# Patient Record
Sex: Female | Born: 1940 | Race: White | Hispanic: No | Marital: Married | Smoking: Never smoker
Health system: Southern US, Community
[De-identification: ages and names within clinical notes are randomized; demographics above are authoritative.]

## PROBLEM LIST (undated history)

## (undated) DIAGNOSIS — H269 Unspecified cataract: Secondary | ICD-10-CM

## (undated) DIAGNOSIS — R946 Abnormal results of thyroid function studies: Secondary | ICD-10-CM

## (undated) DIAGNOSIS — I10 Essential (primary) hypertension: Secondary | ICD-10-CM

## (undated) DIAGNOSIS — G4733 Obstructive sleep apnea (adult) (pediatric): Secondary | ICD-10-CM

## (undated) DIAGNOSIS — E669 Obesity, unspecified: Secondary | ICD-10-CM

## (undated) DIAGNOSIS — Z8619 Personal history of other infectious and parasitic diseases: Secondary | ICD-10-CM

## (undated) DIAGNOSIS — G4730 Sleep apnea, unspecified: Secondary | ICD-10-CM

## (undated) DIAGNOSIS — I219 Acute myocardial infarction, unspecified: Secondary | ICD-10-CM

## (undated) DIAGNOSIS — I4891 Unspecified atrial fibrillation: Secondary | ICD-10-CM

## (undated) DIAGNOSIS — N183 Chronic kidney disease, stage 3 unspecified: Secondary | ICD-10-CM

## (undated) DIAGNOSIS — N059 Unspecified nephritic syndrome with unspecified morphologic changes: Secondary | ICD-10-CM

## (undated) DIAGNOSIS — Z9289 Personal history of other medical treatment: Secondary | ICD-10-CM

## (undated) DIAGNOSIS — E039 Hypothyroidism, unspecified: Secondary | ICD-10-CM

## (undated) DIAGNOSIS — E43 Unspecified severe protein-calorie malnutrition: Secondary | ICD-10-CM

## (undated) DIAGNOSIS — F0631 Mood disorder due to known physiological condition with depressive features: Secondary | ICD-10-CM

## (undated) DIAGNOSIS — K52839 Microscopic colitis, unspecified: Secondary | ICD-10-CM

## (undated) DIAGNOSIS — D519 Vitamin B12 deficiency anemia, unspecified: Secondary | ICD-10-CM

## (undated) DIAGNOSIS — R0600 Dyspnea, unspecified: Secondary | ICD-10-CM

## (undated) DIAGNOSIS — R918 Other nonspecific abnormal finding of lung field: Secondary | ICD-10-CM

## (undated) DIAGNOSIS — K648 Other hemorrhoids: Secondary | ICD-10-CM

## (undated) DIAGNOSIS — M1990 Unspecified osteoarthritis, unspecified site: Secondary | ICD-10-CM

## (undated) DIAGNOSIS — Z86010 Personal history of colonic polyps: Secondary | ICD-10-CM

## (undated) DIAGNOSIS — I2510 Atherosclerotic heart disease of native coronary artery without angina pectoris: Secondary | ICD-10-CM

## (undated) DIAGNOSIS — E785 Hyperlipidemia, unspecified: Secondary | ICD-10-CM

## (undated) HISTORY — DX: Other nonspecific abnormal finding of lung field: R91.8

## (undated) HISTORY — DX: Hypothyroidism, unspecified: E03.9

## (undated) HISTORY — DX: Other hemorrhoids: K64.8

## (undated) HISTORY — DX: Chronic kidney disease, stage 3 unspecified: N18.30

## (undated) HISTORY — DX: Unspecified atrial fibrillation: I48.91

## (undated) HISTORY — PX: CARDIAC CATHETERIZATION: SHX172

## (undated) HISTORY — DX: Personal history of other infectious and parasitic diseases: Z86.19

## (undated) HISTORY — DX: Microscopic colitis, unspecified: K52.839

## (undated) HISTORY — DX: Vitamin B12 deficiency anemia, unspecified: D51.9

## (undated) HISTORY — DX: Sleep apnea, unspecified: G47.30

## (undated) HISTORY — PX: BREAST BIOPSY: SHX20

## (undated) HISTORY — DX: Unspecified nephritic syndrome with unspecified morphologic changes: N05.9

## (undated) HISTORY — DX: Personal history of other medical treatment: Z92.89

## (undated) HISTORY — DX: Hyperlipidemia, unspecified: E78.5

## (undated) HISTORY — DX: Mood disorder due to known physiological condition with depressive features: F06.31

## (undated) HISTORY — DX: Abnormal results of thyroid function studies: R94.6

## (undated) HISTORY — DX: Unspecified severe protein-calorie malnutrition: E43

## (undated) HISTORY — DX: Obstructive sleep apnea (adult) (pediatric): G47.33

## (undated) HISTORY — DX: Chronic kidney disease, stage 3 (moderate): N18.3

## (undated) HISTORY — DX: Dyspnea, unspecified: R06.00

## (undated) HISTORY — DX: Personal history of colonic polyps: Z86.010

## (undated) HISTORY — DX: Unspecified cataract: H26.9

## (undated) HISTORY — DX: Atherosclerotic heart disease of native coronary artery without angina pectoris: I25.10

## (undated) HISTORY — DX: Essential (primary) hypertension: I10

---

## 1992-07-07 HISTORY — PX: APPENDECTOMY: SHX54

## 1998-06-20 ENCOUNTER — Ambulatory Visit (HOSPITAL_COMMUNITY): Admission: RE | Admit: 1998-06-20 | Discharge: 1998-06-20 | Payer: Self-pay | Admitting: Gastroenterology

## 1998-07-07 HISTORY — PX: KNEE ARTHROSCOPY: SUR90

## 1998-11-08 ENCOUNTER — Ambulatory Visit (HOSPITAL_BASED_OUTPATIENT_CLINIC_OR_DEPARTMENT_OTHER): Admission: RE | Admit: 1998-11-08 | Discharge: 1998-11-08 | Payer: Self-pay | Admitting: *Deleted

## 2001-03-10 ENCOUNTER — Other Ambulatory Visit: Admission: RE | Admit: 2001-03-10 | Discharge: 2001-03-10 | Payer: Self-pay | Admitting: Obstetrics and Gynecology

## 2001-07-05 ENCOUNTER — Ambulatory Visit (HOSPITAL_COMMUNITY): Admission: RE | Admit: 2001-07-05 | Discharge: 2001-07-05 | Payer: Self-pay | Admitting: Gastroenterology

## 2001-07-05 ENCOUNTER — Encounter (INDEPENDENT_AMBULATORY_CARE_PROVIDER_SITE_OTHER): Payer: Self-pay

## 2002-04-18 ENCOUNTER — Other Ambulatory Visit: Admission: RE | Admit: 2002-04-18 | Discharge: 2002-04-18 | Payer: Self-pay | Admitting: Obstetrics and Gynecology

## 2002-07-07 HISTORY — PX: CHOLECYSTECTOMY: SHX55

## 2002-07-07 HISTORY — PX: OTHER SURGICAL HISTORY: SHX169

## 2002-10-31 ENCOUNTER — Emergency Department (HOSPITAL_COMMUNITY): Admission: EM | Admit: 2002-10-31 | Discharge: 2002-10-31 | Payer: Self-pay | Admitting: Emergency Medicine

## 2002-10-31 ENCOUNTER — Inpatient Hospital Stay (HOSPITAL_COMMUNITY): Admission: EM | Admit: 2002-10-31 | Discharge: 2002-11-04 | Payer: Self-pay | Admitting: *Deleted

## 2002-10-31 ENCOUNTER — Encounter: Payer: Self-pay | Admitting: Emergency Medicine

## 2002-11-03 ENCOUNTER — Encounter: Payer: Self-pay | Admitting: Cardiology

## 2003-05-12 ENCOUNTER — Other Ambulatory Visit: Admission: RE | Admit: 2003-05-12 | Discharge: 2003-05-12 | Payer: Self-pay | Admitting: Obstetrics and Gynecology

## 2003-07-08 HISTORY — PX: VENTRAL HERNIA REPAIR: SHX424

## 2003-11-18 ENCOUNTER — Emergency Department (HOSPITAL_COMMUNITY): Admission: EM | Admit: 2003-11-18 | Discharge: 2003-11-18 | Payer: Self-pay | Admitting: Emergency Medicine

## 2003-11-23 ENCOUNTER — Emergency Department (HOSPITAL_COMMUNITY): Admission: EM | Admit: 2003-11-23 | Discharge: 2003-11-23 | Payer: Self-pay | Admitting: Emergency Medicine

## 2003-11-27 ENCOUNTER — Encounter: Admission: RE | Admit: 2003-11-27 | Discharge: 2004-01-05 | Payer: Self-pay | Admitting: Family Medicine

## 2004-08-15 ENCOUNTER — Other Ambulatory Visit: Admission: RE | Admit: 2004-08-15 | Discharge: 2004-08-15 | Payer: Self-pay | Admitting: Obstetrics and Gynecology

## 2004-09-11 ENCOUNTER — Ambulatory Visit (HOSPITAL_COMMUNITY): Admission: RE | Admit: 2004-09-11 | Discharge: 2004-09-11 | Payer: Self-pay | Admitting: Gastroenterology

## 2004-09-11 ENCOUNTER — Encounter (INDEPENDENT_AMBULATORY_CARE_PROVIDER_SITE_OTHER): Payer: Self-pay | Admitting: *Deleted

## 2004-09-26 ENCOUNTER — Encounter (INDEPENDENT_AMBULATORY_CARE_PROVIDER_SITE_OTHER): Payer: Self-pay | Admitting: *Deleted

## 2004-09-26 ENCOUNTER — Ambulatory Visit (HOSPITAL_COMMUNITY): Admission: RE | Admit: 2004-09-26 | Discharge: 2004-09-26 | Payer: Self-pay | Admitting: Obstetrics and Gynecology

## 2005-02-14 ENCOUNTER — Encounter: Admission: RE | Admit: 2005-02-14 | Discharge: 2005-05-15 | Payer: Self-pay | Admitting: Family Medicine

## 2005-09-04 ENCOUNTER — Other Ambulatory Visit: Admission: RE | Admit: 2005-09-04 | Discharge: 2005-09-04 | Payer: Self-pay | Admitting: Obstetrics and Gynecology

## 2006-07-23 ENCOUNTER — Inpatient Hospital Stay (HOSPITAL_COMMUNITY): Admission: EM | Admit: 2006-07-23 | Discharge: 2006-07-26 | Payer: Self-pay | Admitting: Emergency Medicine

## 2006-07-23 ENCOUNTER — Ambulatory Visit: Payer: Self-pay | Admitting: Cardiology

## 2006-07-24 ENCOUNTER — Encounter (INDEPENDENT_AMBULATORY_CARE_PROVIDER_SITE_OTHER): Payer: Self-pay | Admitting: *Deleted

## 2009-01-15 ENCOUNTER — Ambulatory Visit (HOSPITAL_COMMUNITY): Admission: RE | Admit: 2009-01-15 | Discharge: 2009-01-16 | Payer: Self-pay | Admitting: Surgery

## 2009-12-11 ENCOUNTER — Encounter: Admission: RE | Admit: 2009-12-11 | Discharge: 2010-03-11 | Payer: Self-pay | Admitting: Family Medicine

## 2010-07-07 HISTORY — PX: SHOULDER ARTHROSCOPY: SHX128

## 2010-08-06 ENCOUNTER — Encounter
Admission: RE | Admit: 2010-08-06 | Discharge: 2010-08-06 | Payer: Self-pay | Source: Home / Self Care | Attending: Family Medicine | Admitting: Family Medicine

## 2010-08-09 ENCOUNTER — Ambulatory Visit: Payer: Medicare Other | Attending: Family Medicine | Admitting: *Deleted

## 2010-08-09 DIAGNOSIS — M545 Low back pain, unspecified: Secondary | ICD-10-CM | POA: Insufficient documentation

## 2010-08-09 DIAGNOSIS — M25519 Pain in unspecified shoulder: Secondary | ICD-10-CM | POA: Insufficient documentation

## 2010-08-09 DIAGNOSIS — IMO0001 Reserved for inherently not codable concepts without codable children: Secondary | ICD-10-CM | POA: Insufficient documentation

## 2010-08-09 DIAGNOSIS — R5381 Other malaise: Secondary | ICD-10-CM | POA: Insufficient documentation

## 2010-08-09 DIAGNOSIS — M542 Cervicalgia: Secondary | ICD-10-CM | POA: Insufficient documentation

## 2010-08-13 ENCOUNTER — Ambulatory Visit: Payer: Medicare Other | Admitting: Physical Therapy

## 2010-08-15 ENCOUNTER — Ambulatory Visit: Payer: Medicare Other | Admitting: Physical Therapy

## 2010-08-20 ENCOUNTER — Ambulatory Visit: Payer: Medicare Other | Admitting: *Deleted

## 2010-08-22 ENCOUNTER — Ambulatory Visit: Payer: Medicare Other | Admitting: *Deleted

## 2010-08-26 ENCOUNTER — Ambulatory Visit: Payer: Medicare Other | Admitting: Physical Therapy

## 2010-08-29 ENCOUNTER — Ambulatory Visit: Payer: Medicare Other | Admitting: Physical Therapy

## 2010-09-02 ENCOUNTER — Ambulatory Visit: Payer: Medicare Other | Admitting: Physical Therapy

## 2010-09-05 ENCOUNTER — Ambulatory Visit: Payer: Medicare Other | Attending: Family Medicine | Admitting: Physical Therapy

## 2010-09-05 DIAGNOSIS — M545 Low back pain, unspecified: Secondary | ICD-10-CM | POA: Insufficient documentation

## 2010-09-05 DIAGNOSIS — R5381 Other malaise: Secondary | ICD-10-CM | POA: Insufficient documentation

## 2010-09-05 DIAGNOSIS — IMO0001 Reserved for inherently not codable concepts without codable children: Secondary | ICD-10-CM | POA: Insufficient documentation

## 2010-09-05 DIAGNOSIS — M542 Cervicalgia: Secondary | ICD-10-CM | POA: Insufficient documentation

## 2010-09-05 DIAGNOSIS — M25519 Pain in unspecified shoulder: Secondary | ICD-10-CM | POA: Insufficient documentation

## 2010-09-09 ENCOUNTER — Ambulatory Visit: Payer: Medicare Other | Admitting: Physical Therapy

## 2010-09-11 ENCOUNTER — Ambulatory Visit: Payer: Medicare Other | Admitting: Physical Therapy

## 2010-09-24 ENCOUNTER — Ambulatory Visit: Payer: Medicare Other | Admitting: Physical Therapy

## 2010-09-25 ENCOUNTER — Ambulatory Visit: Payer: Medicare Other | Admitting: Physical Therapy

## 2010-09-30 ENCOUNTER — Ambulatory Visit: Payer: Medicare Other | Admitting: Physical Therapy

## 2010-10-03 ENCOUNTER — Ambulatory Visit: Payer: Medicare Other | Admitting: Physical Therapy

## 2010-10-07 ENCOUNTER — Ambulatory Visit: Payer: Medicare Other | Attending: Family Medicine | Admitting: Physical Therapy

## 2010-10-07 DIAGNOSIS — M542 Cervicalgia: Secondary | ICD-10-CM | POA: Insufficient documentation

## 2010-10-07 DIAGNOSIS — R5381 Other malaise: Secondary | ICD-10-CM | POA: Insufficient documentation

## 2010-10-07 DIAGNOSIS — M545 Low back pain, unspecified: Secondary | ICD-10-CM | POA: Insufficient documentation

## 2010-10-07 DIAGNOSIS — IMO0001 Reserved for inherently not codable concepts without codable children: Secondary | ICD-10-CM | POA: Insufficient documentation

## 2010-10-07 DIAGNOSIS — M25519 Pain in unspecified shoulder: Secondary | ICD-10-CM | POA: Insufficient documentation

## 2010-10-10 ENCOUNTER — Ambulatory Visit: Payer: Medicare Other | Admitting: Physical Therapy

## 2010-10-13 LAB — HEMOGLOBIN AND HEMATOCRIT, BLOOD: Hemoglobin: 13.4 g/dL (ref 12.0–15.0)

## 2010-10-13 LAB — BASIC METABOLIC PANEL
Calcium: 9.4 mg/dL (ref 8.4–10.5)
Creatinine, Ser: 0.71 mg/dL (ref 0.4–1.2)
GFR calc Af Amer: >60 mL/min (ref >60)
Potassium: 4 mEq/L (ref 3.5–5.1)

## 2010-10-14 ENCOUNTER — Ambulatory Visit: Payer: Medicare Other | Admitting: Physical Therapy

## 2010-10-16 ENCOUNTER — Ambulatory Visit: Payer: Medicare Other | Admitting: Physical Therapy

## 2010-11-19 NOTE — Op Note (Signed)
NAMECHARLESE, Jody Ruiz             ACCOUNT NO.:  [id number]   MEDICAL RECORD NO.:  [id number]          PATIENT TYPE:  OIB   LOCATION:  1611                         FACILITY:  [facility]   PHYSICIAN:  Thornton Park. Daphine Deutscher, MD  DATE OF BIRTH:  09-Dec-1940   DATE OF PROCEDURE:  01/15/2009  DATE OF DISCHARGE:                               OPERATIVE REPORT   PREOPERATIVE DIAGNOSIS:  Umbilical incisional hernia.   POSTOPERATIVE DIAGNOSIS:  Umbilical incisional hernia.   PROCEDURE:  Laparoscopic repair of umbilical incisional hernia with 12-  cm circular Parietex mesh.   SURGEON:  Thornton Park. Daphine Deutscher, M.D.   ASSISTANT:  None.   ANESTHESIA:  General.   DESCRIPTION OF PROCEDURE:  [name] was taken to room 11 on Monday,  January 15, 2009, and given general anesthesia.  The abdomen was prepped  with a Techni-Care equivalent and draped sterilely.  I entered the  abdomen through the left upper quadrant with a 0-degrees OptiView 5-mm  without difficulty and placed another two 5's on the right side and  ultimately a 10/11 on the left lower quadrant in a very oblique angle.  The umbilical defect was easily visualized and was measured both  externally and internally using needles and I got beyond the rim of  this, then went back about 2 to 3 cm, and marked her and ended up with a  defect about I felt would be adequately covered with a 12-cm piece of  mesh.  We got a 12-cm piece of Parietex and I placed four sutures of #1  Novofil around the perimeter on the non-coated surface and then rolled  it after wetting it, and inserted it through the 10/11 in the left lower  quadrant.  It was unrolled and then in the previously marked spots, I  make cuts transversely and using the Surgi-Pass, I went ahead and  retrieved these and tied them.  That pulled the mesh up to the anterior  abdominal wall.  I then tacked it around the perimeter and then placed  four sutures between the initial four, but more inside and  these four  were then tied down, bringing the mesh up to the anterior abdominal wall  and further securing it with the four sutures and then more tacks.  The  end result was a good complete closure of the defect.  I did not try to  reduce the defect other than putting the mesh beneath it.  Everything  else appeared to be in order, as I did survey the abdomen prior to  exiting.  The abdomen was deflated, the trocars were pulled out.  Wounds  were infiltrated with 20 mL of Marcaine.  The patient was taken to  recovery room in satisfactory condition.      Thornton Park Daphine Deutscher, MD  Electronically Signed     MBM/MEDQ  D:  01/15/2009  T:  01/15/2009  Job:  [id number]

## 2010-11-22 NOTE — Consult Note (Signed)
NAMEDEQUITA, SCHLEICHER             ACCOUNT NO.:  [id number]   MEDICAL RECORD NO.:  [id number]          PATIENT TYPE:  INP   LOCATION:  3714                         FACILITY:  [facility]   PHYSICIAN:  Velora Heckler, MD      DATE OF BIRTH:  1941-03-05   DATE OF CONSULTATION:  DATE OF DISCHARGE:                                 CONSULTATION   REFERRING PHYSICIAN:  Dr. Gala Romney, cardiology service.   CHIEF COMPLAINT:  Abdominal pain, acute cholecystitis.   HISTORY OF PRESENT ILLNESS:  The patient is a 70 year old white female  who became acutely on the day prior to admission.  She presented to the  emergency department at [facility].  She has a history of  coronary artery disease and was felt to have atypical chest pain.  She  was seen and evaluated by cardiology and admitted onto the cardiology  service.  However, workup to rule out myocardial infarction was  completely negative.  White count was elevated 12.3.  Physical exam  showed abdominal tenderness.  Abdominal ultrasound was performed which  demonstrated a thick-walled gallbladder containing multiple gallstones  with pericholecystic fluid consistent with acute cholecystitis.  The  patient was started on intravenous antibiotics and general surgery was  consulted.   PAST MEDICAL HISTORY:  1. Status post appendectomy by Dr. Jerelene Redden.  2. Status post breast biopsies for benign disease.  3. History of colonic polypectomy.  4. History of endometrial polyp.  5. History of morbid obesity.  6. History of dyslipidemia.  7. History of degenerative joint disease.  8. History of coronary artery disease.   MEDICATIONS:  Toprol, Atarax.   ALLERGIES:  1. STATIN drugs.  2. SULFA.   SOCIAL HISTORY:  [place]  She is  married.  She has a daughter from [place].  She denies tobacco,  alcohol and drug use.   FAMILY HISTORY:  Notable for coronary artery disease in multiple family   members.   REVIEW OF SYSTEMS:  A 15-system review documented in the medical record  and discussed with the patient.  The patient notes loose stools  yesterday.  She notes nausea and emesis x3.   EXAM:  GENERAL:  A 70 year old white female on ward 3700 at [facility].  Temperature 97.8, pulse 94, respirations 20, blood pressure  161/84, O2 saturation 94% room air.  HEENT:  Shows her to be normocephalic, atraumatic.  Sclerae clear.  Conjunctiva clear.  Dentition fair.  Mucous membranes moist.  Voice  normal.  LUNGS:  Are clear to auscultation bilaterally without rales, rhonchi or  wheeze.  CARDIAC:  Exam shows regular rate and rhythm without murmur.  Peripheral  pulses are full.  ABDOMEN:  Soft, obese with bowel sounds present on auscultation.  There  is a well-healed right lower quadrant incision.  There are multiple  striae across the lower abdominal wall.  There is tenderness in the  epigastrium, right upper quadrant with voluntary guarding.  There is no  palpable mass.  There is a mild Murphy sign with deep inspiration.  There is no  sign of hernia.  EXTREMITIES:  Nontender without edema.  NEUROLOGICALLY:  The patient is alert and oriented without obvious focal  neurologic deficit.   LABORATORY STUDIES:  White count 12.3, hemoglobin 12.4, hematocrit  36.2%, platelet count 273,000.  Creatinine normal 0.8, amylase 23,  lipase 24.  Cardiac enzymes negative.   RADIOGRAPHY:  Ultrasound results as noted above.   IMPRESSION:  1. Acute cholecystitis.  2. Cholelithiasis.   PLAN:  1. Transfer general surgery service at this time.  2. Intravenous Unasyn 3 grams IV piggyback q.6h.  3. Pain medication with Dilaudid as needed.  4. Zofran for nausea as needed.  5. Discussed indications for cholecystectomy during this      hospitalization.  Discussed open cholecystectomy versus      laparoscopic cholecystectomy.  The patient will be made n.p.o.      after midnight and placed in  the preoperative mode, hopefully as an      add-on to the surgical schedule on January18,2008 at [facility].  Dr. Consuello Bossier will be the attending physician      for the general surgery service on that date and will likely      perform her procedure at that time.      Velora Heckler, MD  Electronically Signed     TMG/MEDQ  D:  07/23/2006  T:  07/23/2006  Job:  [id number]   cc:   Bevelyn Buckles. Bensimhon, MD  Reginia Forts, MD  Geoffry Paradise, M.D.

## 2010-11-22 NOTE — Cardiovascular Report (Signed)
Jody Ruiz, Jody Ruiz                       ACCOUNT NO.:  [id number]   MEDICAL RECORD NO.:  [id number]                   PATIENT TYPE:  INP   LOCATION:  2011                                 FACILITY:  [facility]   PHYSICIAN:  Salvadore Farber, M.D.             DATE OF BIRTH:  11-Apr-1941   DATE OF PROCEDURE:  11/04/2002  DATE OF DISCHARGE:  11/04/2002                              CARDIAC CATHETERIZATION   PROCEDURE:  Intravascular ultrasound and pressure wire of the left anterior  descending artery.   CARDIOLOGIST:  Salvadore Farber, M.D.   INDICATIONS:  The patient is a 70 year old lady admitted with multiple  episodes of chest discomfort.  Cardiac catheterization was performed on  November 01, 2002, by Dr. Riley Kill.  This demonstrated a 50-60% stenosis of the  mid LAD.  It was felt to be likely angiographically benign.  She then  underwent an Adenosine Cardiolite study which demonstrated anterior ischemia  again raising concern about the hemodynamic significance of this lesion.  Therefore, she is scheduled for intravascular ultrasound and pressure wire  measurement to rule out plaque rupture and physiologic significance of this  lesion.   DESCRIPTION OF PROCEDURE:  Informed consent was obtained.  Under 1%  lidocaine local anesthesia, a 6 French sheath was placed in the right  femoral artery using the modified Seldinger technique.  Anticoagulation was  imitated with heparin to achieve an ACT of greater than 250 seconds.  A CLS  3.5 guide was advanced over a wire and engaged in the ostium of the left  main coronary.  IVUS was pursued first.  A BMW wire was advanced to the  distal LAD.  The IVUS catheter was positioned in the mid LAD.  Automatic  pullback was performed after the administration of intracoronary  nitroglycerin.  As detailed below, IVUS findings were benign.   Attention was then turned to the pressure wire measurement.  ACT was again  confirmed to be greater than 250  seconds.  The pressure wire was normalized  in the left main coronary and then positioned in the mid LAD, distal to the  lesion.  Then 42 mcg of intracoronary Adenosine was administered for two  successive measurements.  These demonstrated the FFR to be 0.94 and 0.93.  The pressure wire was then removed.  Final angiograms were performed in  multiple projections demonstrating no change in the coronary anatomy and  angiographic stability of the lesion.   COMPLICATIONS:  None.   FINDINGS:  1. Intravascular ultrasound:  There is no evidence of plaque rupture,     dissection, or overlying thrombus.  There is two quadrant superficial     calcification.  Cross-sectional area of the distal reference vessel is     8.7 mm sq.  Cross-sectional area at the lesion is 8 mm sq for a 45% area     of stenosis.  2. FFR:  0.93 and 0.94 on two successive  measurements.  This is     substantially above the threshold of 0.75, below which signifies a     physiologically significant lesion.   IMPRESSION/RECOMMENDATIONS:  The lesion appears stable by intravascular  ultrasound with insignificant stenosis by both intravascular ultrasound and  FFR criteria.  The hazy appearance on angiogram is well explained by the  superficial calcification.   Chest pain is noncardiac in etiology.  The exercise test was falsely  positive.  Will proceed with continued medical therapy.                                               Salvadore Farber, M.D.    WED/MEDQ  D:  11/04/2002  T:  11/05/2002  Job:  [id number]   cc:   Geoffry Paradise, M.D.  [address]  [place]  [place] [address]  Fax: [phone]   Arturo Morton. Riley Kill, M.D.

## 2010-11-22 NOTE — Discharge Summary (Signed)
[name], [name]             ACCOUNT NO.:  [id number]   MEDICAL RECORD NO.:  [id number]          PATIENT TYPE:  INP   LOCATION:  3714                         FACILITY:  [facility]   PHYSICIAN:  Anselm Pancoast. Zachery Dakins, M.D.DATE OF BIRTH:  15-May-1941   DATE OF ADMISSION:  07/23/2006  DATE OF DISCHARGE:  07/26/2006                               DISCHARGE SUMMARY   DISCHARGE DIAGNOSIS:  Acute cholecystitis with stones.   OPERATION:  Laparoscopic cholecystectomy with cholangiogram.   HISTORY:  Jody Ruiz is a 69 year old, mildly overweight female  admitted through the ER by Dr. Jacky Kindle on July 23, 2006 with severe  chest pain.  The patient was seen.  She is on Plavix and Toprol and was  evaluated by her primary care physician to rule out coronary artery  syndrome.  They held off on anticoagulation and felt that the pain was  not cardiac in origin.  An ultrasound was performed that showed a very  edematous, thickened gallbladder and an enormous stone.  Dr. Gerrit Friends was  on call and he thought that patient had acute cholecystitis, and started  her on broad antibiotics and thought she would need a cholecystectomy  the following day.  Her white count was elevated at 12,000, and I was  asked to see her the following morning.  She was definitely tender and  she was added to the OR schedule.  She was taken to surgery,  __________  and an acute inflamed gallbladder, which was removed laparoscopically  and the pathology report showed acute and chronic cholecystitis with  stones.  Postoperatively, I did place a Blake drain and she felt better,  had a minimal amount of drainage, and was ready for discharge in  approximately 24 hours.  She was discharged on her usual medications  plus Vicodin for pain and instructed to see me in the office for drain  removal in one to two additional days.  Her incision appeared to be  healing satisfactory.  We have discharged her on Augmentin 875 mg b.i.d.  for  approximately three days in addition to the Vicodin.           ______________________________  Anselm Pancoast. Zachery Dakins, M.D.     WJW/MEDQ  D:  10/01/2006  T:  10/02/2006  Job:  [id number]   cc:   Geoffry Paradise, M.D.  Fax: [phone]

## 2010-11-22 NOTE — H&P (Signed)
Jody Ruiz, Jody Ruiz             ACCOUNT NO.:  [id number]   MEDICAL RECORD NO.:  [id number]          PATIENT TYPE:  EMS   LOCATION:  MAJO                         FACILITY:  [facility]   PHYSICIAN:  Reginia Forts, MD     DATE OF BIRTH:  Jan 16, 1941   DATE OF ADMISSION:  07/23/2006  DATE OF DISCHARGE:                              HISTORY & PHYSICAL   CHIEF COMPLAINT:  Chest pain with an abdominal pain.   HISTORY OF PRESENT ILLNESS:  Mrs Mccollister is a 70 year old Caucasian woman  with a history of nonobstructive coronary artery disease who presents  with mid sternal abdominal and chest pain.  Historically, the patient  has had midsternal substernal chest pressure in 2004 and underwent  cardiac catheterization demonstrating 50% LAD lesion.  ICUS demonstrated  luminal decrease of 45%, thus deeming it to be a nonobstructive lesion.  The patient was felt to have noncardiac chest pain and was subsequently  underwent follow up with just her primary care physician.  Since then,  she has been doing well up until tonight when she consumed a glass of  milk and a donut to take her calcium pill.  She immediately laid down  and then developed substernal chest pressure at 9/10.  She experienced  loose bowel movements and subsequent nonbloody diarrhea with progression  of her substernal pain down to her abdomen.  She continued to have  symptoms despite taking multiple nitroglycerins.  Her family was  concerned about her cardiac status and subsequently brought her to the  emergency room.  In the ED, she received morphine, Phenergan, Protonix  with minimal relief but increase in her lethargy.  She continues to have  7/10 mid abdominal pain now radiating to her lower quadrant with  significant gas.  Family is concerned that these may be a manifestation  of her ongoing cardiac status.   PAST MEDICAL HISTORY:  1. History of nonobstructive coronary disease.  Catheter on November 01, 2002, demonstrated a  40-50% mid LAD lesion with ICUS, 30% mid to      distal LAD lesion, small ramus that was patent, 10-20% proximal      circumflex lesion and a 10-20% proximal RCA lesion, 30% tandem      lesions in the PL branch.  2. Two breast biopsies looked benign.  3. Endometrial polyp.  4. Morbid obesity.  5. Dyslipidemia.  The patient is intolerant to statins.  6. Degenerative joint disease and iron deficiency anemia.   ALLERGIES:  STATIN, SULFA.   MEDICATIONS:  1. Toprol XL 100 mg daily.  2. Plavix 75 mg daily.   SOCIAL HISTORY:  The patient lives in [place] with her husband.  Daughter lives nearby.  Denies any history of tobacco, alcohol or drug  use.   FAMILY HISTORY:  Notable for a mother with cardiac disease at age 68 and  father had an MI at age 69.  Brother and sister both had MI.   REVIEW OF SYSTEMS:  Reviewed and is negative, all 12 review of systems  is negative.   PHYSICAL EXAMINATION:  VITAL SIGNS:  Temperature 97, pulse 67,  respiratory rate 16, blood pressure 134/61.  GENERAL:  The patient is lethargic but arousable.  HEENT:  Normocephalic, atraumatic.  Pupils equal, round and reactive to  light.  Extraocular movements intact.  NECK:  No JVD.  No carotid bruits.  CARDIOVASCULAR:  Regular rhythm, no murmurs, rubs or gallops.  LUNGS:  Clear to auscultation bilaterally.  ABDOMEN:  Positive bowel sounds, obese with mild diffuse tenderness in  the mid gastric region radiating to the lower quadrant.  No increased  tympany.  SKIN:  No rash.  EXTREMITIES:  No significant tenderness or effusion.  NEUROLOGICAL:  Cranial nerves II-XII intact.  Negative for motor,  sensory deficits.   STUDIES:  Chest x-ray demonstrates a mild bibasilar atelectasis.  EKG  demonstrates normal sinus rhythm at rate of 90 and no ST-T wave changes.   LABORATORY DATA:  Demonstrated white count of 12.3, hemoglobin 12.9,  platelets 233,000.  BUN 19, creatinine 0.8, troponin less than 0.05.  CK  43, D.  dimer of 0.22.   ASSESSMENT/PLAN:  A 70 year old woman with atypical chest pain, symptoms  more consistent with a GI etiology such as a gastroenteritis and GERD as  opposed to acute coronary syndrome.  The patient is concerned about  cardiac cause.  1. Acute coronary syndrome:  Rule the patient out for MI.  We will      hold off on anticoagulation. Stress test may be considered,      although, her exercise stress test in 2004 was false positive.  2. Gastrointestinal:  The patient will receive Protonix and a GI      cocktail and supportive care.      Reginia Forts, MD  Electronically Signed     RA/MEDQ  D:  07/23/2006  T:  07/23/2006  Job:  [id number]   cc:   Doylene Canning. Ladona Ridgel, MD

## 2011-05-26 ENCOUNTER — Ambulatory Visit: Payer: Medicare Other | Attending: Orthopedic Surgery | Admitting: Physical Therapy

## 2011-05-26 DIAGNOSIS — R5381 Other malaise: Secondary | ICD-10-CM | POA: Insufficient documentation

## 2011-05-26 DIAGNOSIS — M25619 Stiffness of unspecified shoulder, not elsewhere classified: Secondary | ICD-10-CM | POA: Insufficient documentation

## 2011-05-26 DIAGNOSIS — IMO0001 Reserved for inherently not codable concepts without codable children: Secondary | ICD-10-CM | POA: Insufficient documentation

## 2011-05-26 DIAGNOSIS — M25519 Pain in unspecified shoulder: Secondary | ICD-10-CM | POA: Insufficient documentation

## 2011-05-27 ENCOUNTER — Ambulatory Visit: Payer: Medicare Other | Admitting: *Deleted

## 2011-06-02 ENCOUNTER — Ambulatory Visit: Payer: Medicare Other | Admitting: Physical Therapy

## 2011-06-04 ENCOUNTER — Ambulatory Visit: Payer: Medicare Other | Admitting: Physical Therapy

## 2011-06-06 ENCOUNTER — Ambulatory Visit: Payer: Medicare Other | Admitting: *Deleted

## 2011-06-09 ENCOUNTER — Ambulatory Visit: Payer: Medicare Other | Attending: Orthopedic Surgery | Admitting: Physical Therapy

## 2011-06-09 DIAGNOSIS — R5381 Other malaise: Secondary | ICD-10-CM | POA: Insufficient documentation

## 2011-06-09 DIAGNOSIS — M25519 Pain in unspecified shoulder: Secondary | ICD-10-CM | POA: Insufficient documentation

## 2011-06-09 DIAGNOSIS — IMO0001 Reserved for inherently not codable concepts without codable children: Secondary | ICD-10-CM | POA: Insufficient documentation

## 2011-06-09 DIAGNOSIS — M25619 Stiffness of unspecified shoulder, not elsewhere classified: Secondary | ICD-10-CM | POA: Insufficient documentation

## 2011-06-11 ENCOUNTER — Ambulatory Visit: Payer: Medicare Other | Admitting: Physical Therapy

## 2011-06-13 ENCOUNTER — Ambulatory Visit: Payer: Medicare Other | Admitting: *Deleted

## 2011-06-16 ENCOUNTER — Ambulatory Visit: Payer: Medicare Other | Admitting: Physical Therapy

## 2011-06-18 ENCOUNTER — Ambulatory Visit: Payer: Medicare Other | Admitting: Physical Therapy

## 2011-06-20 ENCOUNTER — Ambulatory Visit: Payer: Medicare Other | Admitting: *Deleted

## 2011-06-23 ENCOUNTER — Ambulatory Visit: Payer: Medicare Other | Admitting: Physical Therapy

## 2011-06-25 ENCOUNTER — Ambulatory Visit: Payer: Medicare Other | Admitting: Physical Therapy

## 2011-06-27 ENCOUNTER — Ambulatory Visit: Payer: Medicare Other | Admitting: *Deleted

## 2011-07-02 ENCOUNTER — Ambulatory Visit: Payer: Medicare Other | Admitting: Physical Therapy

## 2011-07-04 ENCOUNTER — Ambulatory Visit: Payer: Medicare Other | Admitting: Physical Therapy

## 2011-07-08 DIAGNOSIS — I219 Acute myocardial infarction, unspecified: Secondary | ICD-10-CM

## 2011-07-08 HISTORY — DX: Acute myocardial infarction, unspecified: I21.9

## 2011-07-09 ENCOUNTER — Ambulatory Visit: Payer: Medicare Other | Attending: Orthopedic Surgery | Admitting: Physical Therapy

## 2011-07-09 DIAGNOSIS — R5381 Other malaise: Secondary | ICD-10-CM | POA: Insufficient documentation

## 2011-07-09 DIAGNOSIS — M25519 Pain in unspecified shoulder: Secondary | ICD-10-CM | POA: Insufficient documentation

## 2011-07-09 DIAGNOSIS — IMO0001 Reserved for inherently not codable concepts without codable children: Secondary | ICD-10-CM | POA: Diagnosis not present

## 2011-07-09 DIAGNOSIS — M25619 Stiffness of unspecified shoulder, not elsewhere classified: Secondary | ICD-10-CM | POA: Diagnosis not present

## 2011-07-11 ENCOUNTER — Ambulatory Visit: Payer: Medicare Other | Admitting: Physical Therapy

## 2011-07-14 ENCOUNTER — Ambulatory Visit: Payer: Medicare Other | Admitting: Physical Therapy

## 2011-07-16 ENCOUNTER — Ambulatory Visit: Payer: Medicare Other | Admitting: Physical Therapy

## 2011-07-16 DIAGNOSIS — R82998 Other abnormal findings in urine: Secondary | ICD-10-CM | POA: Diagnosis not present

## 2011-07-16 DIAGNOSIS — E785 Hyperlipidemia, unspecified: Secondary | ICD-10-CM | POA: Diagnosis not present

## 2011-07-16 DIAGNOSIS — R7301 Impaired fasting glucose: Secondary | ICD-10-CM | POA: Diagnosis not present

## 2011-07-16 DIAGNOSIS — E039 Hypothyroidism, unspecified: Secondary | ICD-10-CM | POA: Diagnosis not present

## 2011-07-16 DIAGNOSIS — I10 Essential (primary) hypertension: Secondary | ICD-10-CM | POA: Diagnosis not present

## 2011-07-18 ENCOUNTER — Ambulatory Visit: Payer: Medicare Other | Admitting: *Deleted

## 2011-07-21 ENCOUNTER — Ambulatory Visit: Payer: Medicare Other | Admitting: Physical Therapy

## 2011-07-23 ENCOUNTER — Ambulatory Visit: Payer: Medicare Other | Admitting: Physical Therapy

## 2011-07-23 DIAGNOSIS — I10 Essential (primary) hypertension: Secondary | ICD-10-CM | POA: Diagnosis not present

## 2011-07-23 DIAGNOSIS — Z0000 Encounter for general adult medical examination without abnormal findings: Secondary | ICD-10-CM | POA: Diagnosis not present

## 2011-07-23 DIAGNOSIS — I2510 Atherosclerotic heart disease of native coronary artery without angina pectoris: Secondary | ICD-10-CM | POA: Diagnosis not present

## 2011-07-23 DIAGNOSIS — E785 Hyperlipidemia, unspecified: Secondary | ICD-10-CM | POA: Diagnosis not present

## 2011-07-24 DIAGNOSIS — Z1212 Encounter for screening for malignant neoplasm of rectum: Secondary | ICD-10-CM | POA: Diagnosis not present

## 2011-07-25 ENCOUNTER — Encounter: Payer: PRIVATE HEALTH INSURANCE | Admitting: *Deleted

## 2011-07-28 ENCOUNTER — Ambulatory Visit: Payer: Medicare Other | Admitting: Physical Therapy

## 2011-07-30 ENCOUNTER — Ambulatory Visit: Payer: Medicare Other | Admitting: Physical Therapy

## 2011-08-01 ENCOUNTER — Ambulatory Visit: Payer: Medicare Other | Admitting: Physical Therapy

## 2011-08-04 ENCOUNTER — Ambulatory Visit: Payer: Medicare Other | Admitting: Physical Therapy

## 2011-08-06 ENCOUNTER — Ambulatory Visit: Payer: Medicare Other | Admitting: Physical Therapy

## 2011-08-08 ENCOUNTER — Ambulatory Visit: Payer: Medicare Other | Attending: Orthopedic Surgery | Admitting: *Deleted

## 2011-08-08 DIAGNOSIS — M25519 Pain in unspecified shoulder: Secondary | ICD-10-CM | POA: Insufficient documentation

## 2011-08-08 DIAGNOSIS — M25619 Stiffness of unspecified shoulder, not elsewhere classified: Secondary | ICD-10-CM | POA: Insufficient documentation

## 2011-08-08 DIAGNOSIS — R5381 Other malaise: Secondary | ICD-10-CM | POA: Insufficient documentation

## 2011-08-08 DIAGNOSIS — IMO0001 Reserved for inherently not codable concepts without codable children: Secondary | ICD-10-CM | POA: Insufficient documentation

## 2011-08-11 ENCOUNTER — Ambulatory Visit: Payer: Medicare Other | Admitting: Physical Therapy

## 2011-08-11 DIAGNOSIS — M25519 Pain in unspecified shoulder: Secondary | ICD-10-CM | POA: Diagnosis not present

## 2011-08-11 DIAGNOSIS — M25619 Stiffness of unspecified shoulder, not elsewhere classified: Secondary | ICD-10-CM | POA: Diagnosis not present

## 2011-08-11 DIAGNOSIS — R5381 Other malaise: Secondary | ICD-10-CM | POA: Diagnosis not present

## 2011-08-11 DIAGNOSIS — IMO0001 Reserved for inherently not codable concepts without codable children: Secondary | ICD-10-CM | POA: Diagnosis not present

## 2011-08-13 ENCOUNTER — Encounter: Payer: PRIVATE HEALTH INSURANCE | Admitting: Physical Therapy

## 2011-08-13 DIAGNOSIS — H35039 Hypertensive retinopathy, unspecified eye: Secondary | ICD-10-CM | POA: Diagnosis not present

## 2011-08-13 DIAGNOSIS — H2510 Age-related nuclear cataract, unspecified eye: Secondary | ICD-10-CM | POA: Diagnosis not present

## 2011-09-11 ENCOUNTER — Encounter: Payer: Self-pay | Admitting: Gastroenterology

## 2011-09-11 DIAGNOSIS — R05 Cough: Secondary | ICD-10-CM | POA: Diagnosis not present

## 2011-09-11 DIAGNOSIS — I10 Essential (primary) hypertension: Secondary | ICD-10-CM | POA: Diagnosis not present

## 2011-09-11 DIAGNOSIS — J321 Chronic frontal sinusitis: Secondary | ICD-10-CM | POA: Diagnosis not present

## 2011-09-15 DIAGNOSIS — R05 Cough: Secondary | ICD-10-CM | POA: Diagnosis not present

## 2011-09-15 DIAGNOSIS — J321 Chronic frontal sinusitis: Secondary | ICD-10-CM | POA: Diagnosis not present

## 2011-09-26 ENCOUNTER — Telehealth: Payer: Self-pay

## 2011-09-26 NOTE — Telephone Encounter (Signed)
I spoke with the pt and she says she does have a recent history with Dr Madilyn Fireman from 11/12.  Appt for 3/26 has been cx.  She would like to switch to Dr Christella Hartigan.  I advised that I would need to have her GI records and Dr Christella Hartigan would review those and if he decides to accept the pt I will call her back and reschedule the appt.  She is bringing what she has here today.

## 2011-09-26 NOTE — Telephone Encounter (Signed)
I spoke with Jody Ruiz at her referring's office and she is going to find out why the pt is coming to our office when she has a recent history 09/23/09 with Dr Madilyn Fireman.  I also called the pt to ask if she was switching care and why.

## 2011-09-30 ENCOUNTER — Ambulatory Visit: Payer: PRIVATE HEALTH INSURANCE | Admitting: Gastroenterology

## 2011-10-06 DIAGNOSIS — J189 Pneumonia, unspecified organism: Secondary | ICD-10-CM | POA: Diagnosis not present

## 2011-10-06 DIAGNOSIS — K589 Irritable bowel syndrome without diarrhea: Secondary | ICD-10-CM | POA: Diagnosis not present

## 2011-10-06 DIAGNOSIS — R05 Cough: Secondary | ICD-10-CM | POA: Diagnosis not present

## 2011-10-06 DIAGNOSIS — I10 Essential (primary) hypertension: Secondary | ICD-10-CM | POA: Diagnosis not present

## 2011-10-13 ENCOUNTER — Telehealth: Payer: Self-pay

## 2011-10-13 DIAGNOSIS — R05 Cough: Secondary | ICD-10-CM | POA: Diagnosis not present

## 2011-10-13 DIAGNOSIS — I10 Essential (primary) hypertension: Secondary | ICD-10-CM | POA: Diagnosis not present

## 2011-10-13 DIAGNOSIS — R197 Diarrhea, unspecified: Secondary | ICD-10-CM | POA: Diagnosis not present

## 2011-10-13 DIAGNOSIS — I2510 Atherosclerotic heart disease of native coronary artery without angina pectoris: Secondary | ICD-10-CM | POA: Diagnosis not present

## 2011-10-13 NOTE — Telephone Encounter (Signed)
Please let Dr Jacky Kindle know that I would be happy to see the patient. Put in new patient slot, no overbook. Keep records on my desk

## 2011-10-13 NOTE — Telephone Encounter (Signed)
Records placed on Dr. Lamar Sprinkles desk for review. Please advise if you will see this pt for Dr. Jacky Kindle.

## 2011-10-13 NOTE — Telephone Encounter (Signed)
Pt has seen Dr. Madilyn Fireman in the past but has requested to switch. Dr. Jacky Kindle referred the pt here. Pt was scheduled to see Dr. Christella Hartigan and then the appt was cancelled. Pt was then scheduled to see Dr. Russella Dar and the appt was cancelled. Pt does not know why the appts were cancelled. Dr. Jacky Kindle is asking if Dr. [name] will please do him a personal favor and see the pt. Malachi Bonds to fax records for Dr. [name] to review.

## 2011-10-14 DIAGNOSIS — K5289 Other specified noninfective gastroenteritis and colitis: Secondary | ICD-10-CM | POA: Diagnosis not present

## 2011-10-14 NOTE — Telephone Encounter (Signed)
Pt scheduled to see Dr. [name] 11/13/11@8 :Dala Dock to notify pt of appt date and time. Records are still on Dr. Lamar Sprinkles desk.

## 2011-11-13 ENCOUNTER — Encounter: Payer: Self-pay | Admitting: Internal Medicine

## 2011-11-13 ENCOUNTER — Ambulatory Visit (INDEPENDENT_AMBULATORY_CARE_PROVIDER_SITE_OTHER): Payer: Medicare Other | Admitting: Internal Medicine

## 2011-11-13 VITALS — BP 150/84 | HR 72 | Ht 68.0 in | Wt 222.4 lb

## 2011-11-13 DIAGNOSIS — Z860100 Personal history of colon polyps, unspecified: Secondary | ICD-10-CM

## 2011-11-13 DIAGNOSIS — K52832 Lymphocytic colitis: Secondary | ICD-10-CM

## 2011-11-13 DIAGNOSIS — K5289 Other specified noninfective gastroenteritis and colitis: Secondary | ICD-10-CM | POA: Diagnosis not present

## 2011-11-13 DIAGNOSIS — R197 Diarrhea, unspecified: Secondary | ICD-10-CM

## 2011-11-13 DIAGNOSIS — R109 Unspecified abdominal pain: Secondary | ICD-10-CM

## 2011-11-13 DIAGNOSIS — Z800 Family history of malignant neoplasm of digestive organs: Secondary | ICD-10-CM

## 2011-11-13 DIAGNOSIS — M7500 Adhesive capsulitis of unspecified shoulder: Secondary | ICD-10-CM | POA: Diagnosis not present

## 2011-11-13 NOTE — Progress Notes (Signed)
HISTORY OF PRESENT ILLNESS:  Jody Ruiz is a 71 y.o. female with hypertension, coronary artery disease, and morbid obesity. She is sent today with multiple GI complaints and to establish as a GI patient. She has previously been under the care of Dr. Dorena Cookey, for greater than 1 decade. She has been followed by Dr. Madilyn Fireman for a family history of colon cancer, personal history of adenomatous colon polyps (including villous adenoma of the rectum with high-grade dysplasia in 1999), and microscopic colitis. I have spent greater than one hour reviewing records from Dr. Madilyn Fireman, including office notes, pathology, and colonoscopy reports. As well, records from Dr. Jacky Kindle, including office notes, laboratories, and microbiology. The patient is accompanied today by her husband. It appears that she began to have problems with chronic diarrhea in 2006. At that time, she underwent complete colonoscopy as well as upper endoscopy with colonic and small intestinal biopsies respectively. The small bowel biopsies were unremarkable. However the colonic biopsies revealed lymphocytic colitis. She had been on different therapies including mesalamine and budesonide. Followup colonoscopy with biopsies in 2011 revealed the same. There is some question regarding the effectiveness of therapies. Also some question regarding compliance and issues regarding cost. As best I can tell, it seems that budesonide they have been helpful, though she did not want to take medication as a maintenance drug due to cost. She tells me that her relationship with Dr. Madilyn Fireman deteriorated, and she wished to establish her GI care elsewhere. Since the onset of problems with diarrhea, she reports to me having intermittent "flares". She describes a flare as abdominal cramping associated with urgency and diarrhea. She may have 7-10 bowel movements per day, and occasionally nocturnal bowel movements. Symptoms are increased postprandially. Flares may last for one  to 4 weeks, depending upon therapeutic intervention. Once a flare passes, she states she may go 2-6 months without any difficulties. During that time, she describes one formed bowel movement daily. Recent problems began after being treated with multiple antibiotics for an upper respiratory infection. Subsequently she developed diarrhea and weight loss. Eventually treated with metronidazole with resolution of diarrhea and again back of weight. Currently, she is asymptomatic. Laboratories from earlier this she her including CBC and comprehensive metabolic panel were unremarkable. Last colonoscopy did not reveal any polyps. The patient is also status post appendectomy, cholecystectomy, and ventral hernia repair.  REVIEW OF SYSTEMS:  All non-GI ROS negative except for sinus trouble, back pain, cough, fatigue, shortness of breath, sore throat.  Past Medical History  Diagnosis Date  . Anemia   . Hx of adenomatous colonic polyps   . CAD (coronary artery disease)   . Hypertension   . Microscopic colitis     Past Surgical History  Procedure Date  . Appendectomy   . Cholecystectomy   . Ventral hernia repair   . Breast biopsy     bilateral  . Knee arthroscopy     bilateral  . Vaginal polypectomy   . Shoulder arthroscopy 2012    left  . Cardiac catheterization     Social History [name]  reports that she has never smoked. She has never used smokeless tobacco. She reports that she does not drink alcohol or use illicit drugs.  family history includes Breast cancer in her sister; Colon cancer in her sister; Colon polyps in her father; Heart disease in her brother, father, mother, and sister; Kidney disease in her father; and Prostate cancer in her father.  Allergies  Allergen Reactions  .  Statins     Muscle Weakness  . Macrodantin (Nitrofurantoin Macrocrystal) Rash  . Sulfa Antibiotics Rash       PHYSICAL EXAMINATION: Vital signs: BP 150/84  Pulse 72  Ht 5\' 8"  (1.727 m)   Wt 222 lb 6.4 oz (100.88 kg)  BMI 33.82 kg/m2  Constitutional:obese, generally well-appearing, no acute distress Psychiatric: alert and oriented x3, cooperative Eyes: extraocular movements intact, anicteric, conjunctiva pink Mouth: oral pharynx moist, no lesions Neck: supple no lymphadenopathy Cardiovascular: heart regular rate and rhythm, no murmur Lungs: clear to auscultation bilaterally Abdomen: soft, obese, nontender, nondistended, no obvious ascites, no peritoneal signs, normal bowel sounds, no organomegaly Rectal:ommitted Extremities: no lower extremity edema bilaterally Skin: no lesions on visible extremities Neuro: No focal deficits. No asterixis.     ASSESSMENT:  #1. Recent problems with diarrhea and weight loss secondary antibiotic associated diarrhea, possibly C. Difficile. Improved after a 10 day course of metronidazole #2. Establish lymphocytic colitis #3. History of adenomatous colon polyps #4. Family history of colon cancer #5. General medical problems under the care of Dr. Jacky Kindle   PLAN:  #1. Extensive discussion today with with the patient and her husband regarding the beyond to, diagnosis, treatment, and outcomes of microscopic colitis. I reviewed with her multiple therapeutic options. This discussion to greater than 30 minutes. She was satisfied. #2. Contact the office for a flare of disease. I would consider a course of Entocort (if she could afford it were we had samples). Alternatively, a course of prednisone, which she states she has had in the past and tolerated. #3. Surveillance colonoscopy will be due around March 2016

## 2011-11-13 NOTE — Patient Instructions (Signed)
Please follow as needed

## 2011-11-19 DIAGNOSIS — M19019 Primary osteoarthritis, unspecified shoulder: Secondary | ICD-10-CM | POA: Diagnosis not present

## 2011-11-28 DIAGNOSIS — M75110 Incomplete rotator cuff tear or rupture of unspecified shoulder, not specified as traumatic: Secondary | ICD-10-CM | POA: Diagnosis not present

## 2011-11-28 DIAGNOSIS — M7500 Adhesive capsulitis of unspecified shoulder: Secondary | ICD-10-CM | POA: Diagnosis not present

## 2011-11-28 DIAGNOSIS — M719 Bursopathy, unspecified: Secondary | ICD-10-CM | POA: Diagnosis not present

## 2011-12-09 DIAGNOSIS — Z1231 Encounter for screening mammogram for malignant neoplasm of breast: Secondary | ICD-10-CM | POA: Diagnosis not present

## 2012-01-20 DIAGNOSIS — I2510 Atherosclerotic heart disease of native coronary artery without angina pectoris: Secondary | ICD-10-CM | POA: Diagnosis not present

## 2012-01-20 DIAGNOSIS — E785 Hyperlipidemia, unspecified: Secondary | ICD-10-CM | POA: Diagnosis not present

## 2012-01-20 DIAGNOSIS — M1990 Unspecified osteoarthritis, unspecified site: Secondary | ICD-10-CM | POA: Diagnosis not present

## 2012-01-20 DIAGNOSIS — E039 Hypothyroidism, unspecified: Secondary | ICD-10-CM | POA: Diagnosis not present

## 2012-02-02 DIAGNOSIS — Z124 Encounter for screening for malignant neoplasm of cervix: Secondary | ICD-10-CM | POA: Diagnosis not present

## 2012-03-09 DIAGNOSIS — M5137 Other intervertebral disc degeneration, lumbosacral region: Secondary | ICD-10-CM | POA: Diagnosis not present

## 2012-03-09 DIAGNOSIS — M9981 Other biomechanical lesions of cervical region: Secondary | ICD-10-CM | POA: Diagnosis not present

## 2012-03-09 DIAGNOSIS — M999 Biomechanical lesion, unspecified: Secondary | ICD-10-CM | POA: Diagnosis not present

## 2012-03-10 DIAGNOSIS — M999 Biomechanical lesion, unspecified: Secondary | ICD-10-CM | POA: Diagnosis not present

## 2012-03-10 DIAGNOSIS — M9981 Other biomechanical lesions of cervical region: Secondary | ICD-10-CM | POA: Diagnosis not present

## 2012-03-10 DIAGNOSIS — M5137 Other intervertebral disc degeneration, lumbosacral region: Secondary | ICD-10-CM | POA: Diagnosis not present

## 2012-03-15 DIAGNOSIS — M9981 Other biomechanical lesions of cervical region: Secondary | ICD-10-CM | POA: Diagnosis not present

## 2012-03-15 DIAGNOSIS — M999 Biomechanical lesion, unspecified: Secondary | ICD-10-CM | POA: Diagnosis not present

## 2012-03-15 DIAGNOSIS — M5137 Other intervertebral disc degeneration, lumbosacral region: Secondary | ICD-10-CM | POA: Diagnosis not present

## 2012-03-16 DIAGNOSIS — M9981 Other biomechanical lesions of cervical region: Secondary | ICD-10-CM | POA: Diagnosis not present

## 2012-03-16 DIAGNOSIS — M999 Biomechanical lesion, unspecified: Secondary | ICD-10-CM | POA: Diagnosis not present

## 2012-03-16 DIAGNOSIS — M5137 Other intervertebral disc degeneration, lumbosacral region: Secondary | ICD-10-CM | POA: Diagnosis not present

## 2012-03-17 DIAGNOSIS — M9981 Other biomechanical lesions of cervical region: Secondary | ICD-10-CM | POA: Diagnosis not present

## 2012-03-17 DIAGNOSIS — M999 Biomechanical lesion, unspecified: Secondary | ICD-10-CM | POA: Diagnosis not present

## 2012-03-17 DIAGNOSIS — M5137 Other intervertebral disc degeneration, lumbosacral region: Secondary | ICD-10-CM | POA: Diagnosis not present

## 2012-03-22 DIAGNOSIS — M9981 Other biomechanical lesions of cervical region: Secondary | ICD-10-CM | POA: Diagnosis not present

## 2012-03-22 DIAGNOSIS — M999 Biomechanical lesion, unspecified: Secondary | ICD-10-CM | POA: Diagnosis not present

## 2012-03-22 DIAGNOSIS — M5137 Other intervertebral disc degeneration, lumbosacral region: Secondary | ICD-10-CM | POA: Diagnosis not present

## 2012-03-24 DIAGNOSIS — M9981 Other biomechanical lesions of cervical region: Secondary | ICD-10-CM | POA: Diagnosis not present

## 2012-03-24 DIAGNOSIS — M5137 Other intervertebral disc degeneration, lumbosacral region: Secondary | ICD-10-CM | POA: Diagnosis not present

## 2012-03-24 DIAGNOSIS — M999 Biomechanical lesion, unspecified: Secondary | ICD-10-CM | POA: Diagnosis not present

## 2012-03-25 DIAGNOSIS — M9981 Other biomechanical lesions of cervical region: Secondary | ICD-10-CM | POA: Diagnosis not present

## 2012-03-25 DIAGNOSIS — M5137 Other intervertebral disc degeneration, lumbosacral region: Secondary | ICD-10-CM | POA: Diagnosis not present

## 2012-03-25 DIAGNOSIS — M999 Biomechanical lesion, unspecified: Secondary | ICD-10-CM | POA: Diagnosis not present

## 2012-03-29 DIAGNOSIS — M9981 Other biomechanical lesions of cervical region: Secondary | ICD-10-CM | POA: Diagnosis not present

## 2012-03-29 DIAGNOSIS — M999 Biomechanical lesion, unspecified: Secondary | ICD-10-CM | POA: Diagnosis not present

## 2012-03-29 DIAGNOSIS — M5137 Other intervertebral disc degeneration, lumbosacral region: Secondary | ICD-10-CM | POA: Diagnosis not present

## 2012-03-30 DIAGNOSIS — Z23 Encounter for immunization: Secondary | ICD-10-CM | POA: Diagnosis not present

## 2012-03-31 DIAGNOSIS — M999 Biomechanical lesion, unspecified: Secondary | ICD-10-CM | POA: Diagnosis not present

## 2012-03-31 DIAGNOSIS — M5137 Other intervertebral disc degeneration, lumbosacral region: Secondary | ICD-10-CM | POA: Diagnosis not present

## 2012-03-31 DIAGNOSIS — M9981 Other biomechanical lesions of cervical region: Secondary | ICD-10-CM | POA: Diagnosis not present

## 2012-04-01 DIAGNOSIS — M9981 Other biomechanical lesions of cervical region: Secondary | ICD-10-CM | POA: Diagnosis not present

## 2012-04-01 DIAGNOSIS — M5137 Other intervertebral disc degeneration, lumbosacral region: Secondary | ICD-10-CM | POA: Diagnosis not present

## 2012-04-01 DIAGNOSIS — M999 Biomechanical lesion, unspecified: Secondary | ICD-10-CM | POA: Diagnosis not present

## 2012-04-05 DIAGNOSIS — M999 Biomechanical lesion, unspecified: Secondary | ICD-10-CM | POA: Diagnosis not present

## 2012-04-05 DIAGNOSIS — M5137 Other intervertebral disc degeneration, lumbosacral region: Secondary | ICD-10-CM | POA: Diagnosis not present

## 2012-04-05 DIAGNOSIS — M9981 Other biomechanical lesions of cervical region: Secondary | ICD-10-CM | POA: Diagnosis not present

## 2012-04-06 HISTORY — PX: CORONARY STENT PLACEMENT: SHX1402

## 2012-04-07 DIAGNOSIS — M9981 Other biomechanical lesions of cervical region: Secondary | ICD-10-CM | POA: Diagnosis not present

## 2012-04-07 DIAGNOSIS — M5137 Other intervertebral disc degeneration, lumbosacral region: Secondary | ICD-10-CM | POA: Diagnosis not present

## 2012-04-07 DIAGNOSIS — M999 Biomechanical lesion, unspecified: Secondary | ICD-10-CM | POA: Diagnosis not present

## 2012-04-14 DIAGNOSIS — M9981 Other biomechanical lesions of cervical region: Secondary | ICD-10-CM | POA: Diagnosis not present

## 2012-04-14 DIAGNOSIS — M999 Biomechanical lesion, unspecified: Secondary | ICD-10-CM | POA: Diagnosis not present

## 2012-04-14 DIAGNOSIS — M5137 Other intervertebral disc degeneration, lumbosacral region: Secondary | ICD-10-CM | POA: Diagnosis not present

## 2012-04-28 ENCOUNTER — Inpatient Hospital Stay (HOSPITAL_COMMUNITY)
Admission: EM | Admit: 2012-04-28 | Discharge: 2012-04-30 | DRG: 247 | Disposition: A | Discharge status: Home / Self Care | Payer: Medicare Other | Attending: Cardiovascular Disease | Admitting: Cardiovascular Disease

## 2012-04-28 ENCOUNTER — Encounter (HOSPITAL_COMMUNITY): Payer: Self-pay | Admitting: *Deleted

## 2012-04-28 DIAGNOSIS — E669 Obesity, unspecified: Secondary | ICD-10-CM | POA: Diagnosis present

## 2012-04-28 DIAGNOSIS — I2510 Atherosclerotic heart disease of native coronary artery without angina pectoris: Secondary | ICD-10-CM | POA: Diagnosis present

## 2012-04-28 DIAGNOSIS — Z79899 Other long term (current) drug therapy: Secondary | ICD-10-CM

## 2012-04-28 DIAGNOSIS — R079 Chest pain, unspecified: Secondary | ICD-10-CM | POA: Diagnosis not present

## 2012-04-28 DIAGNOSIS — R072 Precordial pain: Secondary | ICD-10-CM | POA: Diagnosis not present

## 2012-04-28 DIAGNOSIS — E785 Hyperlipidemia, unspecified: Secondary | ICD-10-CM

## 2012-04-28 DIAGNOSIS — Z7982 Long term (current) use of aspirin: Secondary | ICD-10-CM

## 2012-04-28 DIAGNOSIS — E876 Hypokalemia: Secondary | ICD-10-CM | POA: Diagnosis not present

## 2012-04-28 DIAGNOSIS — I214 Non-ST elevation (NSTEMI) myocardial infarction: Principal | ICD-10-CM | POA: Diagnosis present

## 2012-04-28 DIAGNOSIS — I10 Essential (primary) hypertension: Secondary | ICD-10-CM | POA: Diagnosis not present

## 2012-04-28 DIAGNOSIS — D649 Anemia, unspecified: Secondary | ICD-10-CM | POA: Diagnosis present

## 2012-04-28 DIAGNOSIS — Z955 Presence of coronary angioplasty implant and graft: Secondary | ICD-10-CM

## 2012-04-28 DIAGNOSIS — Z8249 Family history of ischemic heart disease and other diseases of the circulatory system: Secondary | ICD-10-CM

## 2012-04-28 HISTORY — DX: Unspecified osteoarthritis, unspecified site: M19.90

## 2012-04-28 HISTORY — DX: Obesity, unspecified: E66.9

## 2012-04-28 LAB — BASIC METABOLIC PANEL
BUN: 18 mg/dL (ref 6–23)
CO2: 26 mEq/L (ref 19–32)
Calcium: 9.5 mg/dL (ref 8.4–10.5)
Chloride: 106 mEq/L (ref 96–112)
Creatinine, Ser: 0.74 mg/dL (ref 0.50–1.10)
GFR calc Af Amer: >90 mL/min (ref >90)
GFR calc non Af Amer: 85 mL/min — ABNORMAL LOW (ref >90)
Glucose, Bld: 115 mg/dL — ABNORMAL HIGH (ref 70–99)
Potassium: 4.8 mEq/L (ref 3.5–5.1)
Sodium: 142 mEq/L (ref 135–145)

## 2012-04-28 LAB — CBC
HCT: 38.9 % (ref 36.0–46.0)
Hemoglobin: 13.4 g/dL (ref 12.0–15.0)
MCH: 32.4 pg (ref 26.0–34.0)
MCHC: 34.4 g/dL (ref 30.0–36.0)
MCV: 94 fL (ref 78.0–100.0)
Platelets: 265 10*3/uL (ref 150–400)
RBC: 4.14 MIL/uL (ref 3.87–5.11)
RDW: 12.6 % (ref 11.5–15.5)
WBC: 9.3 10*3/uL (ref 4.0–10.5)

## 2012-04-28 LAB — TROPONIN I: Troponin I: <0.3 ng/mL (ref <0.30)

## 2012-04-28 MED ORDER — NITROGLYCERIN 2 % TD OINT
1.0000 [in_us] | TOPICAL_OINTMENT | Freq: Once | TRANSDERMAL | Status: AC
  Administered 2012-04-28: 1 [in_us] via TOPICAL
  Filled 2012-04-28: qty 1

## 2012-04-28 MED ORDER — ENOXAPARIN SODIUM 40 MG/0.4ML ~~LOC~~ SOLN: 40.0000 mg | Freq: Every day | SUBCUTANEOUS | Status: DC

## 2012-04-28 MED ORDER — ASPIRIN EC 81 MG PO TBEC: 81.0000 mg | DELAYED_RELEASE_TABLET | Freq: Every day | ORAL | Status: DC

## 2012-04-28 MED ORDER — SODIUM CHLORIDE 0.9 % IJ SOLN: 3.0000 mL | INTRAMUSCULAR | Status: DC | PRN

## 2012-04-28 MED ORDER — LISINOPRIL 5 MG PO TABS
5.0000 mg | ORAL_TABLET | Freq: Every day | ORAL | Status: DC
  Filled 2012-04-28: qty 1

## 2012-04-28 MED ORDER — ONDANSETRON HCL 4 MG/2ML IJ SOLN: 4.0000 mg | Freq: Four times a day (QID) | INTRAMUSCULAR | Status: DC | PRN

## 2012-04-28 MED ORDER — CALCIUM CARBONATE-VITAMIN D 500-200 MG-UNIT PO TABS
1.0000 | ORAL_TABLET | Freq: Two times a day (BID) | ORAL | Status: DC
  Administered 2012-04-29 – 2012-04-30 (×2): 1 via ORAL
  Filled 2012-04-28 (×6): qty 1

## 2012-04-28 MED ORDER — SODIUM CHLORIDE 0.9 % IV SOLN: 250.0000 mL | INTRAVENOUS | Status: DC | PRN

## 2012-04-28 MED ORDER — NITROGLYCERIN 0.4 MG SL SUBL
0.4000 mg | SUBLINGUAL_TABLET | SUBLINGUAL | Status: DC | PRN
  Filled 2012-04-28: qty 25

## 2012-04-28 MED ORDER — ASPIRIN EC 81 MG PO TBEC
81.0000 mg | DELAYED_RELEASE_TABLET | Freq: Every day | ORAL | Status: DC
  Administered 2012-04-29 – 2012-04-30 (×2): 81 mg via ORAL
  Filled 2012-04-28 (×2): qty 1

## 2012-04-28 MED ORDER — METOPROLOL SUCCINATE ER 50 MG PO TB24
50.0000 mg | ORAL_TABLET | Freq: Every day | ORAL | Status: DC
  Administered 2012-04-30: 50 mg via ORAL
  Filled 2012-04-28 (×2): qty 1

## 2012-04-28 MED ORDER — SODIUM CHLORIDE 0.9 % IJ SOLN
3.0000 mL | Freq: Two times a day (BID) | INTRAMUSCULAR | Status: DC
  Administered 2012-04-29: 3 mL via INTRAVENOUS

## 2012-04-28 MED ORDER — CAPTOPRIL 12.5 MG PO TABS
12.5000 mg | ORAL_TABLET | ORAL | Status: AC
  Administered 2012-04-28: 12.5 mg via ORAL
  Filled 2012-04-28: qty 1

## 2012-04-28 MED ORDER — ZOLPIDEM TARTRATE 5 MG PO TABS: 5.0000 mg | ORAL_TABLET | Freq: Every evening | ORAL | Status: DC | PRN

## 2012-04-28 MED ORDER — ACETAMINOPHEN 325 MG PO TABS
650.0000 mg | ORAL_TABLET | ORAL | Status: DC | PRN
  Administered 2012-04-29 – 2012-04-30 (×3): 650 mg via ORAL
  Filled 2012-04-28 (×3): qty 2

## 2012-04-28 NOTE — ED Notes (Signed)
Attempted to call report, nurse not ready.  Left my number, was told they'll call back.

## 2012-04-28 NOTE — ED Notes (Signed)
C/o onset SSCP at 4:15 with radiation to left shoulder. Took own NTG x2, ASA 324mg  prior to EMS arrival. Pain decreased to 2/10.

## 2012-04-28 NOTE — ED Provider Notes (Signed)
History    71 year old female with chest pain. Onset was around 1615 today while taking out the dogs. Substernal. Initially sharp and then turned into a dull ache. Radiated to her left shoulder. Patient took aspirin and two nitroglycerin with some improvement of symptoms. Felt much better by the time EMS arrived and symptom free by the time she got to the hospital. Patient with a past family history of coronary disease. She self-reports previous cardiac catheterizations with the last one being in 2005. She reports mild coronary disease which has been treated medically since then. Dr Riley Kill is her cardiologist. She actually saw him in the office yesterday while with her husband and talked about making an appointment to see him but it wasn't arranged then as she wasn't having symptoms then. Patient also reports decreasing exercise tolerance over the past several months. History of hypertension on metoprolol. No cough. No fevers or chills. No unusual leg pain or swelling. Denies history of blood clot.  CSN: [id number]  Arrival date & time 04/28/12  1815   First MD Initiated Contact with Patient 04/28/12 1833      Chief Complaint  Patient presents with  . Chest Pain    (Consider location/radiation/quality/duration/timing/severity/associated sxs/prior treatment) HPI  Past Medical History  Diagnosis Date  . Anemia   . Hx of adenomatous colonic polyps   . CAD (coronary artery disease)   . Hypertension   . Microscopic colitis     Past Surgical History  Procedure Date  . Appendectomy   . Cholecystectomy   . Ventral hernia repair   . Breast biopsy     bilateral  . Knee arthroscopy     bilateral  . Vaginal polypectomy   . Shoulder arthroscopy 2012    left  . Cardiac catheterization     Family History  Problem Relation Age of Onset  . Breast cancer Sister   . Prostate cancer Father   . Colon cancer Sister   . Colon polyps Father   . Heart disease Mother   . Heart disease Father    . Heart disease Brother   . Heart disease Sister   . Kidney disease Father     History  Substance Use Topics  . Smoking status: Never Smoker   . Smokeless tobacco: Never Used  . Alcohol Use: No    OB History    Grav Para Term Preterm Abortions TAB SAB Ect Mult Living                  Review of Systems   Review of symptoms negative unless otherwise noted in HPI.   Allergies  Statins; Macrodantin; and Sulfa antibiotics  Home Medications   Current Outpatient Rx  Name Route Sig Dispense Refill  . ASPIRIN EC 81 MG PO TBEC Oral Take 81 mg by mouth daily.    Marland Kitchen CALCIUM CARBONATE-VITAMIN D 500-200 MG-UNIT PO TABS Oral Take 1 tablet by mouth 2 (two) times daily.    Marland Kitchen METOPROLOL SUCCINATE ER 100 MG PO TB24 Oral Take 100 mg by mouth daily. Take with or immediately following a meal.      BP 181/64  Pulse 57  Temp 98.1 F (36.7 C) (Oral)  Resp 22  SpO2 98%  Physical Exam  Nursing note and vitals reviewed. Constitutional: She appears well-developed and well-nourished. No distress.       Laying in bed. NAD. Obese.  HENT:  Head: Normocephalic and atraumatic.  Eyes: Conjunctivae normal are normal. Right eye exhibits no discharge.  Left eye exhibits no discharge.  Neck: Neck supple.  Cardiovascular: Normal rate, regular rhythm and normal heart sounds.  Exam reveals no gallop and no friction rub.   No murmur heard. Pulmonary/Chest: Effort normal and breath sounds normal. No respiratory distress.  Abdominal: Soft. She exhibits no distension. There is no tenderness.  Musculoskeletal: She exhibits no edema and no tenderness.       Lower extremities symmetric as compared to each other. No calf tenderness. Negative Homan's. No palpable cords.     Neurological: She is alert.  Skin: Skin is warm and dry.  Psychiatric: She has a normal mood and affect. Her behavior is normal. Thought content normal.    ED Course  Procedures (including critical care time)  Labs Reviewed    BASIC METABOLIC PANEL - Abnormal; Notable for the following:    Glucose, Bld 115 (*)     GFR calc non Af Amer 85 (*)     All other components within normal limits  CBC  TROPONIN I  TROPONIN I  TROPONIN I  TROPONIN I  LIPID PANEL  CBC  BASIC METABOLIC PANEL   No results found.   EKG:  Rhythm: sinus brady with PACs Vent. rate 47 BPM PR interval 180 ms QRS duration 92 ms QT/QTc 400/354 ms Axis: normal ST segments: t wave flattening laterally  Comparison: st/t wave morphology similar to previous  1. Chest pain   2. Stented coronary artery   3. Non-ST elevation myocardial infarction (NSTEMI), initial care episode   4. Dyslipidemia   5. Hypertension       MDM  71yF with CP. EKG relatively stable from previous. Initial trop is normal. Pt has concerning story though. Do not feel she is low enough risk for discharge or for CDU CP protocol. Discussed with cardiology fellow for admission.        Raeford Razor, MD 05/04/12 1755

## 2012-04-28 NOTE — ED Notes (Signed)
Reports onset SSCP while at rest. States CP initially was sharp then turned into heaviness with radiation into LUE. Denied SOB, n/v, diaphoresis. Denies cold, cough. Presently denies CP . Pt also admitted to being under a tremendous amt of stress with family past week.

## 2012-04-28 NOTE — H&P (Addendum)
History and Physical  Patient ID: KINDALL SWABY MRN: [id number], SOB: 19-Mar-1941 71 y.o. Date of Encounter: 04/28/2012, 9:09 PM  Primary Physician: Minda Meo, MD Primary Cardiologist: Dr. Riley Kill (long time ago, currently sees her husband)  Chief Complaint: chest pain  HPI: 71 y.o. female w/ PMHx significant for nonobs CAD by cath 2005, HTN, obesity who presented to [facility] on 04/28/2012 with complaints of substernal chest pain.  She last had a cardiac evaluation in 2004 when she presented with similar chest pain and underwent a nuclear MPI which was positive leading to a cath with per the patients report (done here at [facility] but I am able to see the report) she had 20-30% blockages. No evidence of an MI she was discharged and followed by her PCP with risk reduction. However, she is intolerant to statin therapy and is on a BB for blood pressure (runs 130s to 140s at home) and aspirin.  Today after walking her daughter's dog, she had sudden onset of substernal chest pain. Initially sharp, it became dull across her chest and eventually radiating towards her left arm. Similar in nature to her 2004 event, she took aspirin and 2 nitro with eventual improvement in the pain. Currently chest pain free. No nausea, vomiting, lightheadedness or diaphoresis with the chest pain.  She does endorse recent unexplained fatigue, dyspnea on exertion and overall limited exercise tolerance. She has also been under a lot of stress with her husband's hospitalizations (pt of Dr. Louretta Shorten as well).  EKG revealed sinus brady with occ PAC. Flattened TW laterally.  CXR was without acute cardiopulmonary abnormalities. Negative troponin.   Past Medical History  Diagnosis Date  . Anemia   . Hx of adenomatous colonic polyps   . CAD (coronary artery disease)   . Hypertension   . Microscopic colitis      Surgical History:  Past Surgical History  Procedure Date  . Appendectomy   .  Cholecystectomy   . Ventral hernia repair   . Breast biopsy     bilateral  . Knee arthroscopy     bilateral  . Vaginal polypectomy   . Shoulder arthroscopy 2012    left  . Cardiac catheterization      Home Meds: Prior to Admission medications   Medication Sig Start Date End Date Taking? Authorizing Provider  aspirin EC 81 MG tablet Take 81 mg by mouth daily.   Yes Historical Provider, MD  calcium-vitamin D (OSCAL 500/200 D-3) 500-200 MG-UNIT per tablet Take 1 tablet by mouth 2 (two) times daily.   Yes Historical Provider, MD  metoprolol succinate (TOPROL-XL) 100 MG 24 hr tablet Take 100 mg by mouth daily. Take with or immediately following a meal.   Yes Historical Provider, MD    Allergies:  Allergies  Allergen Reactions  . Statins     Muscle Weakness  . Macrodantin (Nitrofurantoin Macrocrystal) Rash  . Sulfa Antibiotics Rash    History   Social History  . Marital Status: Married    Spouse Name: N/A    Number of Children: N/A  . Years of Education: N/A   Occupational History  . Retired Charity fundraiser    Social History Main Topics  . Smoking status: Never Smoker   . Smokeless tobacco: Never Used  . Alcohol Use: No  . Drug Use: No  . Sexually Active: Not on file   Other Topics Concern  . Not on file   Social History Narrative  . No narrative on file  Family History  Problem Relation Age of Onset  . Breast cancer Sister   . Prostate cancer Father   . Colon cancer Sister   . Colon polyps Father   . Heart disease Mother   . Heart disease Father   . Heart disease Brother   . Heart disease Sister   . Kidney disease Father     Review of Systems: General: negative for chills, fever, night sweats or weight changes.  Cardiovascular: as per HPI.  Dermatological: negative for rash Respiratory: negative for cough or wheezing Urologic: negative for hematuria Abdominal: negative for nausea, vomiting, diarrhea, bright red blood per rectum, melena, or hematemesis.  Colitis is not currently a problem. Neurologic: negative for visual changes, syncope, or dizziness All other systems reviewed and are otherwise negative except as noted above.  Labs:   Lab Results  Component Value Date   WBC 9.3 04/28/2012   HGB 13.4 04/28/2012   HCT 38.9 04/28/2012   MCV 94.0 04/28/2012   PLT 265 04/28/2012    Lab 04/28/12 1932  NA 142  K 4.8  CL 106  CO2 26  BUN 18  CREATININE 0.74  CALCIUM 9.5  PROT --  BILITOT --  ALKPHOS --  ALT --  AST --  GLUCOSE 115*    Basename 04/28/12 1936  CKTOTAL --  CKMB --  TROPONINI <0.30   No results found for this basename: CHOL, HDL, LDLCALC, TRIG   No results found for this basename: DDIMER    Radiology/Studies:  No results found.   EKG: sinus brady with occ PACs. Flattened twi laterally. No comparison available.  Physical Exam: Blood pressure 167/70, pulse 54, temperature 98.1 F (36.7 C), temperature source Oral, resp. rate 22, SpO2 100.00%. General: Well developed, well nourished, in no acute distress. Obese.  Head: Normocephalic, atraumatic, sclera non-icteric, nares are without discharge Neck: Supple. Negative for carotid bruits. JVD not elevated. Lungs: Clear bilaterally to auscultation without wheezes, rales, or rhonchi. Breathing is unlabored. Heart: RRR with S1 S2. No murmurs, rubs, or gallops appreciated. Abdomen: Soft, non-tender, non-distended with normoactive bowel sounds. No rebound/guarding. No obvious abdominal masses. Msk:  Strength and tone appear normal for age. Extremities: No edema. No clubbing or cyanosis. Distal pedal pulses are 2+ and equal bilaterally. Warm and dry. Neuro: Alert and oriented X 3. Moves all extremities spontaneously. Psych:  Responds to questions appropriately with a normal affect.   Problem List 1. Chest pain, r/o ACS 2. Hypertensive urgency 3. Statin intolerance 4. Strong family history  ASSESSMENT AND PLAN:  71 y.o. female w/ PMHx significant for nonobs CAD  by cath 2005, HTN, obesity who presented to [facility] on 04/28/2012 with chest pain concerning for unstable angina. Presents with rather typical symptoms of chest pain, radiation to left arm and relief with nitro glycerin. Encouragingly her initial troponin is negative and her EKG, though mildly abnormal with TW flattening, doesn't meet criteria for ACS.   Though she reports her blood pressure is reasonably controlled at home, here her blood pressure is quite elevated in the 180s to 200s despite taking her beta blocker. Will treat acutely with captopril and add ACEI for the AM. Bradycardiac currently.  Continue aspirin. Unfortunately, she is intolerant to statins (multiple trials of various statins). Recheck lipids in the AM.  Cycle troponins, hold beta blocker in case noninvasive stress is chosen. Low threshold to start full anticoagulation (recurrent chest pain, +troponin, etc).   Signed, Adolm Joseph, Troy Sine MD 04/28/2012, 9:09 PM  Addendum  12:00 midnight. Troponin 0.99. Patient continues to be chest pain free. In light of the NSTEMI, will plan on invasive strategy with cath. Restart BB. NPO. Consent ordered. Heparin gtt.

## 2012-04-28 NOTE — Progress Notes (Signed)
CRITICAL VALUE ALERT  Critical value received:  0.99 Troponin  Date of notification:  04/28/12  Time of notification:  2346  Critical value read back:yes  Nurse who received alert:  Rowe Pavy RN  MD notified (1st page):  Dr. Adolm Joseph  Time of first page:  2350  MD notified (2nd page):  Time of second page:  Responding MD:  Dr. Adolm Joseph  Time MD responded:  2351

## 2012-04-29 ENCOUNTER — Encounter (HOSPITAL_COMMUNITY): Payer: Self-pay | Admitting: Physician Assistant

## 2012-04-29 ENCOUNTER — Encounter (HOSPITAL_COMMUNITY)
Admission: EM | Disposition: A | Discharge status: Home / Self Care | Payer: Self-pay | Source: Home / Self Care | Attending: Cardiovascular Disease

## 2012-04-29 DIAGNOSIS — I214 Non-ST elevation (NSTEMI) myocardial infarction: Secondary | ICD-10-CM | POA: Diagnosis not present

## 2012-04-29 DIAGNOSIS — R079 Chest pain, unspecified: Secondary | ICD-10-CM | POA: Diagnosis not present

## 2012-04-29 DIAGNOSIS — I2510 Atherosclerotic heart disease of native coronary artery without angina pectoris: Secondary | ICD-10-CM | POA: Diagnosis present

## 2012-04-29 DIAGNOSIS — E876 Hypokalemia: Secondary | ICD-10-CM | POA: Diagnosis not present

## 2012-04-29 DIAGNOSIS — Z79899 Other long term (current) drug therapy: Secondary | ICD-10-CM | POA: Diagnosis not present

## 2012-04-29 DIAGNOSIS — Z7982 Long term (current) use of aspirin: Secondary | ICD-10-CM | POA: Diagnosis not present

## 2012-04-29 DIAGNOSIS — E785 Hyperlipidemia, unspecified: Secondary | ICD-10-CM | POA: Diagnosis present

## 2012-04-29 DIAGNOSIS — I10 Essential (primary) hypertension: Secondary | ICD-10-CM | POA: Diagnosis not present

## 2012-04-29 DIAGNOSIS — E669 Obesity, unspecified: Secondary | ICD-10-CM | POA: Diagnosis not present

## 2012-04-29 DIAGNOSIS — D649 Anemia, unspecified: Secondary | ICD-10-CM | POA: Diagnosis present

## 2012-04-29 DIAGNOSIS — Z8249 Family history of ischemic heart disease and other diseases of the circulatory system: Secondary | ICD-10-CM | POA: Diagnosis not present

## 2012-04-29 HISTORY — PX: LEFT HEART CATHETERIZATION WITH CORONARY ANGIOGRAM: SHX5451

## 2012-04-29 HISTORY — PX: PERCUTANEOUS CORONARY STENT INTERVENTION (PCI-S): SHX5485

## 2012-04-29 LAB — POCT ACTIVATED CLOTTING TIME: Activated Clotting Time: 564 seconds

## 2012-04-29 LAB — HEPATIC FUNCTION PANEL
ALT: 26 U/L (ref 0–35)
AST: 31 U/L (ref 0–37)
Alkaline Phosphatase: 60 U/L (ref 39–117)
Bilirubin, Direct: <0.1 mg/dL (ref 0.0–0.3)
Total Bilirubin: 0.5 mg/dL (ref 0.3–1.2)

## 2012-04-29 LAB — PROTIME-INR: INR: 1.06 (ref 0.00–1.49)

## 2012-04-29 LAB — BASIC METABOLIC PANEL
BUN: 17 mg/dL (ref 6–23)
Calcium: 9.1 mg/dL (ref 8.4–10.5)
GFR calc non Af Amer: 88 mL/min — ABNORMAL LOW (ref >90)
Glucose, Bld: 104 mg/dL — ABNORMAL HIGH (ref 70–99)
Sodium: 142 mEq/L (ref 135–145)

## 2012-04-29 LAB — LIPID PANEL
Cholesterol: 165 mg/dL (ref 0–200)
HDL: 43 mg/dL (ref >39)
LDL Cholesterol: 96 mg/dL (ref 0–99)
Triglycerides: 132 mg/dL (ref <150)

## 2012-04-29 LAB — CBC
Hemoglobin: 12.3 g/dL (ref 12.0–15.0)
MCH: 32 pg (ref 26.0–34.0)
MCHC: 34.3 g/dL (ref 30.0–36.0)

## 2012-04-29 LAB — TROPONIN I: Troponin I: 1.74 ng/mL (ref <0.30)

## 2012-04-29 SURGERY — LEFT HEART CATHETERIZATION WITH CORONARY ANGIOGRAM
Anesthesia: LOCAL

## 2012-04-29 MED ORDER — METOPROLOL TARTRATE 1 MG/ML IV SOLN: 5.0000 mg | INTRAVENOUS | Status: DC | PRN

## 2012-04-29 MED ORDER — HEPARIN SODIUM (PORCINE) 1000 UNIT/ML IJ SOLN
INTRAMUSCULAR | Status: AC
  Filled 2012-04-29: qty 1

## 2012-04-29 MED ORDER — FENTANYL CITRATE 0.05 MG/ML IJ SOLN
INTRAMUSCULAR | Status: AC
  Filled 2012-04-29: qty 2

## 2012-04-29 MED ORDER — BIVALIRUDIN 250 MG IV SOLR
INTRAVENOUS | Status: AC
  Filled 2012-04-29: qty 250

## 2012-04-29 MED ORDER — SODIUM CHLORIDE 0.9 % IJ SOLN: 3.0000 mL | Freq: Two times a day (BID) | INTRAMUSCULAR | Status: DC

## 2012-04-29 MED ORDER — VERAPAMIL HCL 2.5 MG/ML IV SOLN
INTRAVENOUS | Status: AC
  Filled 2012-04-29: qty 2

## 2012-04-29 MED ORDER — SIMVASTATIN 5 MG PO TABS: 5.0000 mg | ORAL_TABLET | ORAL | Status: DC

## 2012-04-29 MED ORDER — LIDOCAINE HCL (PF) 1 % IJ SOLN
INTRAMUSCULAR | Status: AC
  Filled 2012-04-29: qty 30

## 2012-04-29 MED ORDER — MORPHINE SULFATE 2 MG/ML IJ SOLN
2.0000 mg | INTRAMUSCULAR | Status: DC | PRN
  Administered 2012-04-29: 2 mg via INTRAVENOUS

## 2012-04-29 MED ORDER — TICAGRELOR 90 MG PO TABS
ORAL_TABLET | ORAL | Status: AC
  Filled 2012-04-29: qty 6

## 2012-04-29 MED ORDER — SODIUM CHLORIDE 0.9 % IV SOLN: 250.0000 mL | INTRAVENOUS | Status: DC | PRN

## 2012-04-29 MED ORDER — SODIUM CHLORIDE 0.9 % IJ SOLN: 3.0000 mL | INTRAMUSCULAR | Status: DC | PRN

## 2012-04-29 MED ORDER — MORPHINE SULFATE 2 MG/ML IJ SOLN
INTRAMUSCULAR | Status: AC
  Filled 2012-04-29: qty 1

## 2012-04-29 MED ORDER — HEPARIN (PORCINE) IN NACL 2-0.9 UNIT/ML-% IJ SOLN
INTRAMUSCULAR | Status: AC
  Filled 2012-04-29: qty 1000

## 2012-04-29 MED ORDER — HEPARIN (PORCINE) IN NACL 100-0.45 UNIT/ML-% IJ SOLN
1200.0000 [IU]/h | INTRAMUSCULAR | Status: DC
  Administered 2012-04-29: 1200 [IU]/h via INTRAVENOUS
  Filled 2012-04-29 (×2): qty 250

## 2012-04-29 MED ORDER — NITROGLYCERIN 0.2 MG/ML ON CALL CATH LAB
INTRAVENOUS | Status: AC
  Filled 2012-04-29: qty 1

## 2012-04-29 MED ORDER — MIDAZOLAM HCL 2 MG/2ML IJ SOLN
INTRAMUSCULAR | Status: AC
  Filled 2012-04-29: qty 2

## 2012-04-29 MED ORDER — LISINOPRIL 10 MG PO TABS
10.0000 mg | ORAL_TABLET | Freq: Every day | ORAL | Status: DC
  Administered 2012-04-30: 10 mg via ORAL
  Filled 2012-04-29: qty 1

## 2012-04-29 MED ORDER — ASPIRIN 81 MG PO CHEW
324.0000 mg | CHEWABLE_TABLET | ORAL | Status: DC
  Filled 2012-04-29: qty 4

## 2012-04-29 MED ORDER — HEPARIN BOLUS VIA INFUSION
4000.0000 [IU] | Freq: Once | INTRAVENOUS | Status: AC
  Administered 2012-04-29: 4000 [IU] via INTRAVENOUS
  Filled 2012-04-29: qty 4000

## 2012-04-29 MED ORDER — ISOSORBIDE DINITRATE 20 MG PO TABS
40.0000 mg | ORAL_TABLET | Freq: Once | ORAL | Status: AC
  Administered 2012-04-29: 40 mg via ORAL
  Filled 2012-04-29: qty 2

## 2012-04-29 MED ORDER — TICAGRELOR 90 MG PO TABS
90.0000 mg | ORAL_TABLET | Freq: Two times a day (BID) | ORAL | Status: DC
  Administered 2012-04-29 – 2012-04-30 (×2): 90 mg via ORAL
  Filled 2012-04-29 (×3): qty 1

## 2012-04-29 MED ORDER — EZETIMIBE 10 MG PO TABS
10.0000 mg | ORAL_TABLET | Freq: Every day | ORAL | Status: DC
Start: 2012-04-29 — End: 2012-04-30
  Administered 2012-04-29 – 2012-04-30 (×2): 10 mg via ORAL
  Filled 2012-04-29 (×3): qty 1

## 2012-04-29 MED ORDER — SODIUM CHLORIDE 0.9 % IV SOLN
1.0000 mL/kg/h | INTRAVENOUS | Status: AC
  Administered 2012-04-29: 1 mL/kg/h via INTRAVENOUS

## 2012-04-29 NOTE — Interval H&P Note (Signed)
History and Physical Interval Note:  04/29/2012 8:42 AM  Jody Ruiz  has presented today for surgery, with the diagnosis of cp  The various methods of treatment have been discussed with the patient and family. After consideration of risks, benefits and other options for treatment, the patient has consented to  Procedure(s) (LRB) with comments: LEFT HEART CATHETERIZATION WITH CORONARY ANGIOGRAM (N/A) as a surgical intervention .  The patient's history has been reviewed, patient examined, no change in status, stable for surgery.  I have reviewed the patient's chart and labs.  Questions were answered to the patient's satisfaction.     Theron Arista [facility] 04/29/2012 8:42 AM

## 2012-04-29 NOTE — Progress Notes (Signed)
Pt c/o sharp chest pain 8/10. Vitals, EKG obtained, MD notified. BP 181/74 HR 64. Pt given nitro x 1 with pain stated at 6/10 after 5 min. Nitro x 1 given 5 minutes later with relief stated at 3-4/10 BP 150/78 - HR 71. 2 MG Morphine given per order on way to cath lab.

## 2012-04-29 NOTE — Progress Notes (Signed)
Patient seen and examined and history reviewed. Agree with above findings and plan. Plan to proceed with cardiac cath with possible PCI today for NSTEMI and persistent chest pain. Will add ACEi to Toprol for optimal BP control. Given history of statin intolerance I would start Zetia and hold statins.  Theron Arista [facility] 04/29/2012 10:23 AM

## 2012-04-29 NOTE — Progress Notes (Signed)
UR done. 

## 2012-04-29 NOTE — Progress Notes (Signed)
Pt's BP is 180/59-180/62 with hr 50's.  Dr. Leeann Must notified and updated on pt's course of events today and meds.  Order received for isordil 40mg  po x 1 now.  Will continue to monitor closely.

## 2012-04-29 NOTE — Progress Notes (Signed)
ANTICOAGULATION CONSULT NOTE - Initial Consult  Pharmacy Consult for Heparin Indication: chest pain/ACS  Allergies  Allergen Reactions  . Statins     Muscle Weakness  . Macrodantin (Nitrofurantoin Macrocrystal) Rash  . Sulfa Antibiotics Rash    Patient Measurements: Height: 5\' 7"  (170.2 cm) IBW/kg (Calculated) : 61.6  Heparin Dosing Weight: 85 kg   Vital Signs: Temp: 98.1 F (36.7 C) (10/23 2316) Temp src: Oral (10/23 1824) BP: 173/60 mmHg (10/23 2316) Pulse Rate: 49  (10/23 2316)  Labs:  Basename 04/28/12 2207 04/28/12 1936 04/28/12 1932  HGB -- -- 13.4  HCT -- -- 38.9  PLT -- -- 265  APTT -- -- --  LABPROT -- -- --  INR -- -- --  HEPARINUNFRC -- -- --  CREATININE -- -- 0.74  CKTOTAL -- -- --  CKMB -- -- --  TROPONINI 0.99* <0.30 --    The CrCl is unknown because both a height and weight (above a minimum accepted value) are required for this calculation.   Medical History: Past Medical History  Diagnosis Date  . Anemia   . Hx of adenomatous colonic polyps   . CAD (coronary artery disease)   . Hypertension   . Microscopic colitis   . Arthritis     Medications:  Prescriptions prior to admission  Medication Sig Dispense Refill  . aspirin EC 81 MG tablet Take 81 mg by mouth daily.      . calcium-vitamin D (OSCAL 500/200 D-3) 500-200 MG-UNIT per tablet Take 1 tablet by mouth 2 (two) times daily.      . metoprolol succinate (TOPROL-XL) 100 MG 24 hr tablet Take 100 mg by mouth daily. Take with or immediately following a meal.        Assessment: 71 yo female with chest pain, elevated cardiac markers, for Heparin  Goal of Therapy:  Heparin level 0.3-0.7 units/ml Monitor platelets by anticoagulation protocol: Yes   Plan:  Heparin 4000 units VI bolus, then 1200 units/hr Check heparin level in 8 hours.  Eddie Candle 04/29/2012,12:01 AM

## 2012-04-29 NOTE — CV Procedure (Signed)
Cardiac Catheterization Procedure Note  Name: Jody Ruiz MRN: [id number] DOB: 01/06/1941  Procedure: Left Heart Cath, Selective Coronary Angiography, LV angiography, PTCA and stenting of the  proximal and mid LAD  Indication: 71 year old white female with history of hypertension and hyperlipidemia. She has a history of nonobstructive coronary disease by cardiac catheterization in 2004. She presents with a non-ST elevation myocardial infarction. Troponin is 1.76. ECG shows T-wave inversion in the lateral leads.  Procedural Details:  The right wrist was prepped, draped, and anesthetized with 1% lidocaine. Using the modified Seldinger technique, a 5 French sheath was introduced into the right radial artery. 3 mg of verapamil was administered through the sheath, weight-based unfractionated heparin was administered intravenously. Standard Judkins catheters were used for selective coronary angiography and left ventriculography. Catheter exchanges were performed over an exchange length guidewire.  PROCEDURAL FINDINGS Hemodynamics: AO 161/67 with a mean of 102 mmHg LV 162/20 mmHg.   Coronary angiography: Coronary dominance: right  Left mainstem: Normal.  Left anterior descending (LAD): The LAD is moderately calcified in the proximal vessel. There is a focal 90% stenosis in the proximal vessel at the takeoff of the first septal perforator and first diagonal. In the mid vessel there is a long segment of disease up to 70-80% from the second septal perforator past the second diagonal. The diagonal branches are small. The first diagonal has 70-80% disease proximally.  There is a small intermediate branch that has minor disease proximally up to 30%.  Left circumflex (LCx): The left circumflex coronary gives rise to 2 marginal branches. The circumflex has mild nonobstructive disease up to 20%. The first marginal branch has a 40% focal stenosis at its takeoff. The second marginal branch has 30%  disease in the mid vessel.  Right coronary artery (RCA): The right coronary is a dominant vessel. There is 30% disease in the proximal vessel. The right coronary gives rise to the PDA and a posterior lateral branch distally. The PDA has mild disease up to 30-40%. The posterior lateral branch has a 70% stenosis proximally followed by a 90% stenosis in the mid vessel.  Left ventriculography: Left ventricular systolic function is normal, LVEF is estimated at 65%, there is no significant mitral regurgitation   PCI Note:  Following the diagnostic procedure, the decision was made to proceed with PCI of the proximal and mid LAD. The radial sheath was upsized to a 6 [place]. Weight-based bivalirudin was given for anticoagulation. Brilinta 180 mg was given PO. Once a therapeutic ACT was achieved, a 6 [place] SL 3.5 guide catheter was inserted.  A pro-water coronary guidewire was used to cross the lesion.  The lesions were predilated with a 2.5 mm balloon.  The lesion in the mid LAD was then stented with a 2.5 x 28 mm Promus stent.  The stent was postdilated with a 2.75 mm noncompliant balloon. The lesion in the proximal LAD was then stented with a 3.0 x 16 mm Promus stent. The stent was postdilated with a 3.25 mm noncompliant balloon. Following PCI, there was 0% residual stenosis and TIMI-3 flow. Final angiography confirmed an excellent result. There was no compromise of the diagonal or septal perforator side branches.The patient tolerated the procedure well. There were no immediate procedural complications. A TR band was used for radial hemostasis. The patient was transferred to the post catheterization recovery area for further monitoring.  PCI Data: Vessel - LAD/Segment - proximal and mid Percent Stenosis (pre)  90% and 80% respectively TIMI-flow 3 Stent 3.0  x 16 mm Promus and 2.5 x 28 mm Promus stents respectively Percent Stenosis (post) 0% for both lesions. TIMI-flow (post) 3  Final Conclusions:   1. 2  vessel obstructive atherosclerotic coronary disease. 2. Normal left ventricular function. 3. Successful intracoronary stenting of the proximal and mid LAD with drug-eluting stents.   Recommendations:  Continue dual antiplatelet therapy for one year. The posterior lateral branch is small and I would treated initially with medication and optimal blood pressure control. If she remains significantly symptomatic this could be potentially stented. Since she is statin intolerant we will start her on Zetia 10 mg daily.  Iriel Nason [facility] 04/29/2012, 10:09 AM

## 2012-04-29 NOTE — Progress Notes (Signed)
Called to patient room approx. 1240 for right radial site bleeding.  Patient was post fourth deflation in the TR Band protocol.  12 CC air reinserted into band; no hematoma noted.  Patient without pain or numbness/tingling; capillary refill less than 3 seconds.  Resumed deflations at 1315; progressing on schedule at this time without problems.

## 2012-04-29 NOTE — Progress Notes (Signed)
Patient Name: Jody Ruiz Date of Encounter: 04/29/2012  Principal Problem:  *Non-ST elevation myocardial infarction (NSTEMI), initial care episode Active Problems:  Hypertension    SUBJECTIVE: Pt developed chest pain this am, 8/10 initially, down to 3/10 after SL NTG x 2. Slightly SOB, no N&V. Some radiation to left arm with numbness.  OBJECTIVE Filed Vitals:   04/28/12 2159 04/28/12 2205 04/28/12 2316 04/29/12 0500  BP: 186/51  173/60 147/73  Pulse:   49 55  Temp:   98.1 F (36.7 C) 97.7 F (36.5 C)  TempSrc:      Resp:   18 18  Height:   5\' 7"  (1.702 m)   Weight:    224 lb 9.6 oz (101.878 kg)  SpO2:  96% 96% 97%    Intake/Output Summary (Last 24 hours) at 04/29/12 0857 Last data filed at 04/29/12 0040  Gross per 24 hour  Intake      3 ml  Output      0 ml  Net      3 ml   Filed Weights   04/29/12 0500  Weight: 224 lb 9.6 oz (101.878 kg)    PHYSICAL EXAM General: Well developed, well nourished, female in mild distress. Head: Normocephalic, atraumatic.  Neck: Supple without bruits, JVD not elevated. Lungs:  Resp regular and unlabored, CTA. Heart: RRR, S1, S2, no S3, S4, or murmur. Abdomen: Soft, non-tender, non-distended, BS + x 4.  Extremities: No clubbing, cyanosis, no edema.  Neuro: Alert and oriented X 3. Moves all extremities spontaneously. Psych: Normal affect.  LABS: CBC:  Basename 04/29/12 0420 04/28/12 1932  WBC 8.4 9.3  NEUTROABS -- --  HGB 12.3 13.4  HCT 35.9* 38.9  MCV 93.5 94.0  PLT 250 265   INR:  Basename 04/29/12 0420  INR 1.06   Basic Metabolic Panel:  Basename 04/29/12 0420 04/28/12 1932  NA 142 142  K 3.8 4.8  CL 106 106  CO2 26 26  GLUCOSE 104* 115*  BUN 17 18  CREATININE 0.67 0.74  CALCIUM 9.1 9.5  MG -- --  PHOS -- --   Liver Function Tests:No results found for this basename: AST:2,ALT:2,ALKPHOS:2,BILITOT:2,PROT:2,ALBUMIN:2 in the last 72 hours Cardiac Enzymes:  Basename 04/29/12 0420 04/28/12 2207  04/28/12 1936  CKTOTAL -- -- --  CKMB -- -- --  CKMBINDEX -- -- --  TROPONINI 1.74* 0.99* <0.30   Fasting Lipid Panel:  Basename 04/29/12 0420  CHOL 165  HDL 43  LDLCALC 96  TRIG 132  CHOLHDL 3.8  LDLDIRECT --   TELE: SR,  Sinus brady, 2.07 sec pause with HR 54 this am  ECG: 29-Apr-2012 05:14:48 [facility] ROUTINE RECORD Sinus bradycardia ST & T wave abnormality, consider lateral ischemia Abnormal ECG 62mm/s 2mm/mV 100Hz  8.0.1 12SL 241 CID: 1 Referred by: Unconfirmed Vent. rate 58 BPM PR interval 194 ms QRS duration 94 ms QT/QTc 428/420 ms P-R-T axes 49 -11 132  Radiology/Studies: No results found.  Current Medications:    . aspirin  324 mg Oral Pre-Cath  . aspirin EC  81 mg Oral Daily  . calcium-vitamin D  1 tablet Oral BID WC  . captopril  12.5 mg Oral STAT  . fentaNYL      . heparin      . heparin      . heparin  4,000 Units Intravenous Once  . lidocaine      . lisinopril  5 mg Oral Daily  . metoprolol succinate  50 mg  Oral Daily  . midazolam      . morphine      . nitroGLYCERIN  1 inch Topical Once  . nitroGLYCERIN      . sodium chloride  3 mL Intravenous Q12H  . sodium chloride  3 mL Intravenous Q12H  . verapamil      . DISCONTD: aspirin EC  81 mg Oral Daily  . DISCONTD: enoxaparin (LOVENOX) injection  40 mg Subcutaneous Daily      . heparin 1,200 Units/hr (04/29/12 0038)    ASSESSMENT AND PLAN: Principal Problem:  *Non-ST elevation myocardial infarction (NSTEMI), initial care episode - with ongoing pain, will treat with ASA 81 x 4, SL NTG, Morphine, heparin, can use IV Lopressor to help BP. Cath urgently, the lab is aware.  Active Problems:  Hypertension - pt/dtr are concerned about her bradycardia/pause, she is on home dose of Toprol, follow.  Obesity - needs heart-healthy diet  Dislipidemia - will add statin to get LDL < 70 with NSTEMI. Start with Pravachol 20 mg every other day since has not tolerated statins in the  past (hospital substitutes Zocor 5 mg). Will check LFTs.  Melida Quitter , PA-C 8:57 AM 04/29/2012

## 2012-04-30 ENCOUNTER — Telehealth: Payer: Self-pay | Admitting: Pharmacist

## 2012-04-30 DIAGNOSIS — I214 Non-ST elevation (NSTEMI) myocardial infarction: Principal | ICD-10-CM

## 2012-04-30 DIAGNOSIS — I10 Essential (primary) hypertension: Secondary | ICD-10-CM | POA: Diagnosis not present

## 2012-04-30 DIAGNOSIS — I2510 Atherosclerotic heart disease of native coronary artery without angina pectoris: Secondary | ICD-10-CM | POA: Diagnosis not present

## 2012-04-30 DIAGNOSIS — R079 Chest pain, unspecified: Secondary | ICD-10-CM | POA: Diagnosis not present

## 2012-04-30 DIAGNOSIS — E669 Obesity, unspecified: Secondary | ICD-10-CM | POA: Diagnosis not present

## 2012-04-30 LAB — BASIC METABOLIC PANEL
CO2: 23 mEq/L (ref 19–32)
Calcium: 9 mg/dL (ref 8.4–10.5)
Chloride: 104 mEq/L (ref 96–112)
Glucose, Bld: 120 mg/dL — ABNORMAL HIGH (ref 70–99)
Sodium: 139 mEq/L (ref 135–145)

## 2012-04-30 LAB — CBC
Hemoglobin: 11.7 g/dL — ABNORMAL LOW (ref 12.0–15.0)
MCHC: 34.2 g/dL (ref 30.0–36.0)
Platelets: 234 10*3/uL (ref 150–400)
RBC: 3.67 MIL/uL — ABNORMAL LOW (ref 3.87–5.11)

## 2012-04-30 MED ORDER — NITROGLYCERIN 0.4 MG SL SUBL: 0.4000 mg | SUBLINGUAL_TABLET | SUBLINGUAL | Status: DC | PRN

## 2012-04-30 MED ORDER — TICAGRELOR 90 MG PO TABS: 90.0000 mg | ORAL_TABLET | Freq: Two times a day (BID) | ORAL | Status: DC

## 2012-04-30 MED ORDER — EZETIMIBE 10 MG PO TABS: 10.0000 mg | ORAL_TABLET | Freq: Every day | ORAL | Status: DC

## 2012-04-30 MED ORDER — LISINOPRIL 10 MG PO TABS: 10.0000 mg | ORAL_TABLET | Freq: Every day | ORAL | Status: DC

## 2012-04-30 MED ORDER — POTASSIUM CHLORIDE CRYS ER 20 MEQ PO TBCR
40.0000 meq | EXTENDED_RELEASE_TABLET | Freq: Once | ORAL | Status: AC
Start: 2012-04-30 — End: 2012-04-30
  Administered 2012-04-30: 10:00:00 40 meq via ORAL
  Filled 2012-04-30: qty 2

## 2012-04-30 MED FILL — Dextrose Inj 5%: INTRAVENOUS | Qty: 50 | Status: AC

## 2012-04-30 NOTE — Discharge Summary (Signed)
Discharge Summary   Patient ID: Jody Ruiz,  MRN: [id number], DOB/AGE: May 02, 1941 71 y.o.  Admit date: 04/28/2012 Discharge date: 04/30/2012  Primary Physician: Jody Meo, MD Primary Cardiologist: Jody Quin, MD  Discharge Diagnoses Principal Problem:  *Non-ST elevation myocardial infarction (NSTEMI), initial care episode Active Problems:  Hypertension  Obesity  Dyslipidemia   Allergies Allergies  Allergen Reactions  . Statins     Muscle Weakness  . Macrodantin (Nitrofurantoin Macrocrystal) Rash  . Sulfa Antibiotics Rash    Diagnostic Studies/Procedures  CARDIAC CATHETERIZATION + PCI - 04/29/12  Hemodynamics:  AO 161/67 with a mean of 102 mmHg  LV 162/20 mmHg.  Coronary angiography:  Coronary dominance: right  Left mainstem: Normal.  Left anterior descending (LAD): The LAD is moderately calcified in the proximal vessel. There is a focal 90% stenosis in the proximal vessel at the takeoff of the first septal perforator and first diagonal. In the mid vessel there is a long segment of disease up to 70-80% from the second septal perforator past the second diagonal. The diagonal branches are small. The first diagonal has 70-80% disease proximally.  There is a small intermediate branch that has minor disease proximally up to 30%.  Left circumflex (LCx): The left circumflex coronary gives rise to 2 marginal branches. The circumflex has mild nonobstructive disease up to 20%. The first marginal branch has a 40% focal stenosis at its takeoff. The second marginal branch has 30% disease in the mid vessel.  Right coronary artery (RCA): The right coronary is a dominant vessel. There is 30% disease in the proximal vessel. The right coronary gives rise to the PDA and a posterior lateral branch distally. The PDA has mild disease up to 30-40%. The posterior lateral branch has a 70% stenosis proximally followed by a 90% stenosis in the mid vessel.  Left ventriculography:  Left ventricular systolic function is normal, LVEF is estimated at 65%, there is no significant mitral regurgitation   PCI Data:  Vessel - LAD/Segment - proximal and mid  Percent Stenosis (pre) 90% and 80% respectively  TIMI-flow 3  Stent 3.0 x 16 mm Promus and 2.5 x 28 mm Promus stents respectively  Percent Stenosis (post) 0% for both lesions.  TIMI-flow (post) 3   Final Conclusions:  1. 2 vessel obstructive atherosclerotic coronary disease.  2. Normal left ventricular function.  3. Successful intracoronary stenting of the proximal and mid LAD with drug-eluting stents.   History of Present Illness  Jody Ruiz is a 71yo female admitted to [facility] on 04/28/12 with the above problem list. She had a prior nonobstructive cath in 2004 in the setting of + nuclear perfusion imaging for chest pain and followed by her PCP for risk reduction. Statin intolerance noted. The day of admission, she experienced sharp cp rad to her L arm similar to 2004 event while walking her dog. It was responsive to ASA and NTG. No assoc sxs, however she did endorse DOE and limited exercise tolerance recently. EKG in the ED revealed no ischemic changes and initial trop was negative.  Hospital Course   She was placed in observation initially. A subs trop returned mildly elevated. The pt denied cp at that time (midnight). She was started on heparin. A lipid panel revealed elevated LDL (goal < 70). She was assessed the following AM. Trop had increased, but not substantially; however, she then endorsed 8/10 SSCP which was responsive to NTG SL. Given evidence of NSTEMI and ongoing cp, she was transferred for urgent cath. She  was loaded with Brilinta. The full details are outlined above. Notably, DES x 2 were placed to proximal and mid LAD stenoses, respectively. There were also 70% prox and 90% mid PLB stenoses which were medically managed. Should angina persist or recur, PCI of these lesions was advised. LVEF was preserved. She  tolerated the procedure well without complications. The recommendation was made to pursue DAPT- ASA/Brilinta x 1 year. Zetia was added for lipid control. Of note, BPs while inpatient were uncontrolled at times (SBP 150-180s). Outpatient BB was cut in half on admission given lower BPs in the ED. ACEi was added for further BP management. Outpatient BB regimen will be resumed. Her last trop down-trended appropriately and she remained asymptomatic overnight without acute events. She ambulated with cardiac rehab without incident. She will be discharged today on the medications listed below. Arrangements have been made for Brilinta assistance. She will follow-up </= 7 days as scheduled below. BMET will be drawn at that time given ACEi initiation. This information, including post-cath instructions, has been clearly outlined in the discharge AVS.   Discharge Vitals:  Blood pressure 143/57, pulse 83, temperature 97.9 F (36.6 C), temperature source Oral, resp. rate 20, height 5\' 7"  (1.702 m), weight 102.5 kg (225 lb 15.5 oz), SpO2 96.00%.  Weight change: 0.622 kg (1 lb 5.9 oz)  Labs: Recent Labs  [facility] 04/30/12 0540 04/29/12 0420   WBC 7.8 8.4   HGB 11.7* 12.3   HCT 34.2* 35.9*   MCV 93.2 93.5   PLT 234 250   Lab 04/30/12 0540 04/29/12 1128 04/29/12 0420 04/28/12 1932  NA 139 -- 142 142  K 3.4* -- 3.8 4.8  CL 104 -- 106 106  CO2 23 -- 26 26  BUN 10 -- 17 18  CREATININE 0.63 -- 0.67 0.74  CALCIUM 9.0 -- 9.1 9.5  PROT -- 6.8 -- --  BILITOT -- 0.5 -- --  ALKPHOS -- 60 -- --  ALT -- 26 -- --  AST -- 31 -- --  AMYLASE -- -- -- --  LIPASE -- -- -- --  GLUCOSE 120* -- 104* 115*   Recent Labs  Basename 04/29/12 1128 04/29/12 0420 04/28/12 2207   CKTOTAL -- -- --   CKMB -- -- --   CKMBINDEX -- -- --   TROPONINI 0.52* 1.74* 0.99*   Recent Labs  Basename 04/29/12 0420   CHOL 165   HDL 43   LDLCALC 96   TRIG 132   CHOLHDL 3.8   LDLDIRECT --   Disposition:  Discharge Orders    Future  Appointments: Provider: Department: Dept Phone: Center:   05/04/2012 11:10 AM Jody Lecher, PA [facility] [phone] [facility]   05/04/2012 12:10 PM [facility] [phone] [facility]     Future Orders Please Complete By Expires   Amb Referral to Cardiac Rehabilitation        Follow-up Information    Schedule an appointment as soon as possible for a visit with Jody Meo, MD.   Contact information:   [address] [facility] [place] [address] [phone]       Follow up with Tereso Newcomer, PA. On 05/04/2012. (At 11:10 AM for follow-up after this hospitalization. Labwork will be done at this time. You will see Tereso Newcomer for Dr. Riley Kill. )    Contact information:   [address] [place] [address] [phone]         Discharge Medications:  Medication List     As of 04/30/2012 11:26 AM    START taking these medications         ezetimibe 10 MG tablet   Commonly known as: ZETIA   Take 1 tablet (10 mg total) by mouth daily.      lisinopril 10 MG tablet   Commonly known as: PRINIVIL,ZESTRIL   Take 1 tablet (10 mg total) by mouth daily.      nitroGLYCERIN 0.4 MG SL tablet   Commonly known as: NITROSTAT   Place 1 tablet (0.4 mg total) under the tongue every 5 (five) minutes x 3 doses as needed for chest pain.      Ticagrelor 90 MG Tabs tablet   Commonly known as: BRILINTA   Take 1 tablet (90 mg total) by mouth 2 (two) times daily.      CONTINUE taking these medications         aspirin EC 81 MG tablet      metoprolol succinate 100 MG 24 hr tablet   Commonly known as: TOPROL-XL      OSCAL 500/200 D-3 500-200 MG-UNIT per tablet   Generic drug: calcium-vitamin D          Where to get your medications    These are the prescriptions that you need to pick up. We sent them to a specific pharmacy, so you will need to go there to get them.   [facility] [place] - [address]    [address] [place] [address]    Phone: [phone]        ezetimibe 10 MG tablet   lisinopril 10 MG tablet   nitroGLYCERIN 0.4 MG SL tablet   Ticagrelor 90 MG Tabs tablet           Outstanding Labs/Studies: BMET on 10/29  Duration of Discharge Encounter: Greater than 30 minutes including physician time.  Signed, R. Hurman Horn, PA-C 04/30/2012, 11:26 AM

## 2012-04-30 NOTE — Progress Notes (Addendum)
Patient Name: Jody Ruiz Date of Encounter: 04/30/2012     Principal Problem:  *Non-ST elevation myocardial infarction (NSTEMI), initial care episode Active Problems:  Hypertension  Obesity  Dyslipidemia    SUBJECTIVE: Feels well. Ambulated without incident. Denies cp or SOB.    OBJECTIVE  Filed Vitals:   04/29/12 1946 04/29/12 2210 04/30/12 0036 04/30/12 0422  BP: 151/63 180/62 151/67 142/64  Pulse: 68  76 72  Temp: 98.3 F (36.8 C)  97.6 F (36.4 C) 97.9 F (36.6 C)  TempSrc: Oral  Oral Oral  Resp: 20  19 15   Height:      Weight:   102.5 kg (225 lb 15.5 oz)   SpO2: 100%  97% 96%    Intake/Output Summary (Last 24 hours) at 04/30/12 0831 Last data filed at 04/29/12 2000  Gross per 24 hour  Intake    480 ml  Output      0 ml  Net    480 ml   Weight change: 0.622 kg (1 lb 5.9 oz)  PHYSICAL EXAM  General: Well developed, well nourished, in no acute distress. Head: Normocephalic, atraumatic, sclera non-icteric, no xanthomas, nares are without discharge.  Neck: Supple without bruits or JVD. Lungs:  Resp regular and unlabored, CTA. Heart: RRR no s3, s4, or murmurs. Abdomen: Soft, non-tender, non-distended, BS + x 4.  Msk:  Strength and tone appears normal for age. Extremities: R wrist access site without hematoma, erythema or discharge. Mildly tender. No clubbing, cyanosis or edema. DP/PT/Radials 2+ and equal bilaterally. Neuro:  Alert and oriented X 3. Moves all extremities spontaneously. Psych: Normal affect.  LABS:  Recent Labs  [facility] 04/30/12 0540 04/29/12 0420   WBC 7.8 8.4   HGB 11.7* 12.3   HCT 34.2* 35.9*   MCV 93.2 93.5   PLT 234 250   Lab 04/30/12 0540 04/29/12 1128 04/29/12 0420 04/28/12 1932  NA 139 -- 142 142  K 3.4* -- 3.8 4.8  CL 104 -- 106 106  CO2 23 -- 26 26  BUN 10 -- 17 18  CREATININE 0.63 -- 0.67 0.74  CALCIUM 9.0 -- 9.1 9.5  PROT -- 6.8 -- --  BILITOT -- 0.5 -- --  ALKPHOS -- 60 -- --  ALT -- 26 -- --  AST --  31 -- --  AMYLASE -- -- -- --  LIPASE -- -- -- --  GLUCOSE 120* -- 104* 115*   Recent Labs  Basename 04/29/12 1128 04/29/12 0420 04/28/12 2207   CKTOTAL -- -- --   CKMB -- -- --   CKMBINDEX -- -- --   TROPONINI 0.52* 1.74* 0.99*   Recent Labs  [facility] 04/29/12 0420   CHOL 165   HDL 43   LDLCALC 96   TRIG 132   CHOLHDL 3.8   LDLDIRECT --   TELE: NSR/mild sinus tach on activity this AM  ECG: NSR, 71 bpm, TWIs I, aVL, V5, V6, IVCD, LAE, repol abnlity   Radiology/Studies:  No results found.  Current Medications:     . aspirin EC  81 mg Oral Daily  . bivalirudin      . calcium-vitamin D  1 tablet Oral BID WC  . ezetimibe  10 mg Oral Daily  . fentaNYL      . heparin      . isosorbide dinitrate  40 mg Oral Once  . lisinopril  10 mg Oral Daily  . metoprolol succinate  50 mg Oral Daily  . midazolam      .  morphine      . Ticagrelor      . Ticagrelor  90 mg Oral BID  . verapamil      . DISCONTD: aspirin  324 mg Oral Pre-Cath  . DISCONTD: lisinopril  5 mg Oral Daily  . DISCONTD: simvastatin  5 mg Oral QODAY  . DISCONTD: sodium chloride  3 mL Intravenous Q12H  . DISCONTD: sodium chloride  3 mL Intravenous Q12H    ASSESSMENT AND PLAN:  1. NSTEMI- s/p urgent cath yesterday in setting of ongoing pain. Successful DES to prox & mid LAD. 70% prox & 90% dist PL branch not intervened. Rec for PCI should angina persist/recur. Feels well. Ambulated with cardiac rehab without issues. Continue ASA/Brilinta/ACEi/BB/Zetia. Add NTG.   2. HTN- marked at times this admission. Episode of HTN (~ 180/60s) last night. Developed HA w/Isordil. First dose of Lisinopril scheduled for 10 AM. Continue this and Toprol-XL for BP control. Check BMET 1 wk. Check BP on follow-up, and up-titrate as this allows.   3. Dyslipidemia- statin intolerant. Continue Zetia.   4. Obesity- encourage heart healthy diet & exercise for risk reduction.   5. Hypokalemia- replete prior to d/c.  Dispo: plan for  d/c today with follow-up in </= 7 days given NSTEMI. Check BMET at that time. Had question re: driving. Advised to abstain from driving for 2 weeks given NSTEMI and PCI. This can be addressed on follow-up as well.    Signed, R. Hurman Horn, PA-C 04/30/2012, 8:31 AM  Patient stable and doing well.  Appreciate work of Dr. [place].  She is pain free and improved.  I have personally seen and examined the patient and will see her back in follow up.  I have reviewed and discussed her anatomy with her----regarding conservative management of the PLA territory.  I have also reviewed side effects of Brilinta and given her instructions.  I have also discussed ACE inhibitors, side effects, and need for BMET in one-two weeks.

## 2012-04-30 NOTE — Telephone Encounter (Signed)
Pt. Is at home and states she is doing ok since hosp. d/c and that her daughter is at pharmacy picking up pt's meds at this time. Pt. Is reminded of appt. scheduled with Wende Mott, PA-C on 10/29 at 11:10, pt. verbalized understanding. Also, pt. given office phone # to call if she needs our assistance.

## 2012-04-30 NOTE — Progress Notes (Signed)
CARDIAC REHAB PHASE I   PRE:  Rate/Rhythm: 80 SR    BP: sitting 113/59    SaO2: 97 RA  MODE:  Ambulation: 650 ft   POST:  Rate/Rhythm: 122 ST    BP: sitting 145/80     SaO2: 97 RA  HR up with activity, related SOB. Rest x1. Ed completed, requests her name be sent to [facility]. l [phone]  Harriet Masson CES, ACSM

## 2012-04-30 NOTE — Telephone Encounter (Signed)
Message copied by Velda Shell on Fri Apr 30, 2012 12:09 PM ------      Message from: Paulene Floor      Created: Fri Apr 30, 2012 11:20 AM      Regarding: 7-10 Day Transition of Care Patient       I scheduled the patient to see Tereso Newcomer on 05/04/12 @ 11:10am per Alinda Money PA.

## 2012-04-30 NOTE — Progress Notes (Signed)
Pt took the ordered isordil 40 po at 2317, developed a huge headache which was decreased with tylenol and bp came down to 151/67 and now 142/63.

## 2012-04-30 NOTE — Telephone Encounter (Signed)
**Note De-identified Mignon Bechler Obfuscation** LMTCB

## 2012-04-30 NOTE — Care Management Note (Signed)
    Page 1 of 1   04/30/2012     10:19:02 AM   CARE MANAGEMENT NOTE 04/30/2012  Patient:  Jody Ruiz, Jody Ruiz   Account Number:  [id number]  Date Initiated:  04/30/2012  Documentation initiated by:  CRAFT,TERRI  Subjective/Objective Assessment:   71 yo female admitted 04/28/12 with chest pressure and chest pain.     Action/Plan:   D/C when medically stable.   Anticipated DC Date:  04/30/2012   Anticipated DC Plan:  HOME/SELF CARE      DC Planning Services  CM consult              Status of service:  Completed, signed off    Discharge Disposition:  HOME/SELF CARE  Per UR Regulation:  Reviewed for med. necessity/level of care/duration of stay   Comments:  04/30/12, Kathi Der RNC-MNN, BSN, [phone], CM received referral and met with pt.  Pt given Brilinta card. Discussed pharmacies and discounts.  Questions answered.

## 2012-05-03 ENCOUNTER — Telehealth: Payer: Self-pay | Admitting: Cardiology

## 2012-05-03 NOTE — Telephone Encounter (Signed)
pt was recently d/c from hospital had two stents put in , having chest pain and belching, wants to know if she should take nitro /pls call to advise, has appt tomorrow with scott

## 2012-05-03 NOTE — Telephone Encounter (Signed)
Spoke with pt. Pt discharged from hospital 04/30/12 after having 2 stents placed 04/29/12. Pt had not had any symptoms since discharged until this morning.  This morning while sitting in chair she developed substernal chest pain and belching similar to symptoms that took her to hospital last week. She took 1 NTG with relief. Pt is asymptomatic now except for a headache. Pt has appt with Tereso Newcomer 05/04/12.

## 2012-05-03 NOTE — Telephone Encounter (Signed)
Reviewed with Dr Johney Frame --no new recommendations. Pt advised to take it easy, use NTG if more chest pain, call if more symptoms.

## 2012-05-04 ENCOUNTER — Ambulatory Visit (INDEPENDENT_AMBULATORY_CARE_PROVIDER_SITE_OTHER): Payer: Medicare Other | Admitting: Physician Assistant

## 2012-05-04 ENCOUNTER — Encounter: Payer: Self-pay | Admitting: Physician Assistant

## 2012-05-04 ENCOUNTER — Other Ambulatory Visit (INDEPENDENT_AMBULATORY_CARE_PROVIDER_SITE_OTHER): Payer: Medicare Other

## 2012-05-04 VITALS — BP 143/71 | HR 82 | Ht 68.0 in | Wt 221.0 lb

## 2012-05-04 DIAGNOSIS — R079 Chest pain, unspecified: Secondary | ICD-10-CM

## 2012-05-04 DIAGNOSIS — I10 Essential (primary) hypertension: Secondary | ICD-10-CM

## 2012-05-04 DIAGNOSIS — I2510 Atherosclerotic heart disease of native coronary artery without angina pectoris: Secondary | ICD-10-CM

## 2012-05-04 DIAGNOSIS — R0989 Other specified symptoms and signs involving the circulatory and respiratory systems: Secondary | ICD-10-CM

## 2012-05-04 LAB — BASIC METABOLIC PANEL
CO2: 25 mEq/L (ref 19–32)
Calcium: 9.3 mg/dL (ref 8.4–10.5)
Chloride: 104 mEq/L (ref 96–112)
Glucose, Bld: 114 mg/dL — ABNORMAL HIGH (ref 70–99)
Sodium: 136 mEq/L (ref 135–145)

## 2012-05-04 MED ORDER — ISOSORBIDE MONONITRATE ER 30 MG PO TB24: 30.0000 mg | ORAL_TABLET | Freq: Every day | ORAL | Status: DC

## 2012-05-04 NOTE — Patient Instructions (Addendum)
If you develop recurrent chest discomfort that does not respond to nitroglycerin (1-2 tablets) or if your symptoms worsen or become more frequent, go to the emergency room at [facility] by ambulance.     Your physician has recommended you make the following change in your medication: START IMDUR 30 MG 1 TABLET DAILY  Your physician recommends that you return for lab work in: TODAY BMET  FOLLOW UP WITH [place], [facility] 05/10/12 @ 10:10 AM

## 2012-05-04 NOTE — Progress Notes (Signed)
[address], [address] [place], [place]  [address] Phone: [phone], Fax:  [phone]  Date:  05/04/2012   Name:  Jody Ruiz   DOB:  Nov 21, 1940   MRN:  [id number]  PCP:  Minda Meo, MD  Primary Cardiologist:  Dr.  Shawnie Pons  Primary Electrophysiologist:  None    History of Present Illness: Jody Ruiz is a 71 y.o. female who returns for follow up after recent admission to the hospital for non-STEMI.  She has a hx of nonobs CAD by cath 2005, HTN, HL, obesity.  She was admitted to [facility] 10/23-10/25 with a NSTEMI.  She had ongoing chest pain with + cardiac markers.  Urgent LHC 04/28/12:  pLAD 90%, mLAD 70-80% (long), pD1 70-80%, prox Int Br 30% (small), CFX 20%, OM1 40%, mOM2 30%, pRCA 30%, PDA 30-40%, pPLB 70%, then mid 90%, EF 65%.  PCI:  Promus DES x 2 to prox and mid LAD (3x43mm and 2.5x55mm).    Med Rx recommended for PLB (small vessel).  Consider PCI if symptomatic.  Post PCI course uneventful.  She had done well after discharge until yesterday. She developed substernal chest discomfort that she describes as like a band across her chest. She had some radiation to her back. She had some mild associated shortness of breath. She also had associated belching. No nausea, diaphoresis or syncope. She took NTG with relief. She had recurrent symptoms later in the day that also responded to NTG. She had more symptoms prior to bedtime but did not take NTG. She feels good today. No further chest discomfort. She denies any recent exertional chest discomfort.  Labs (10/13):   K 3.4, creatinine 0.63, ALT 26, LDL 96, Hgb 11.7   Wt Readings from Last 3 Encounters:  05/04/12 221 lb (100.245 kg)  04/30/12 225 lb 15.5 oz (102.5 kg)  04/30/12 225 lb 15.5 oz (102.5 kg)     Past Medical History  Diagnosis Date  . Anemia   . Hx of adenomatous colonic polyps   . CAD (coronary artery disease)     a. NSTEMI 10/13 => LHC 04/28/12:  pLAD 90%, mLAD 70-80% (long), pD1  70-80%, prox Int Br 30% (small), CFX 20%, OM1 40%, mOM2 30%, pRCA 30%, PDA 30-40%, pPLB 70%, then mid 90%, EF 65%.  PCI:  Promus DES x 2 to prox and mid LAD (3x66mm and 2.5x49mm).    Med Rx recommended for PLB (small vessel).  Consider PCI if symptomatic.    Marland Kitchen Hypertension   . Microscopic colitis   . Arthritis   . Obesity   . HLD (hyperlipidemia)     intolerant to statins    Current Outpatient Prescriptions  Medication Sig Dispense Refill  . aspirin EC 81 MG tablet Take 81 mg by mouth daily.      . calcium-vitamin D (OSCAL 500/200 D-3) 500-200 MG-UNIT per tablet Take 1 tablet by mouth 2 (two) times daily.      Marland Kitchen ezetimibe (ZETIA) 10 MG tablet Take 1 tablet (10 mg total) by mouth daily.  30 tablet  3  . lisinopril (PRINIVIL,ZESTRIL) 10 MG tablet Take 1 tablet (10 mg total) by mouth daily.  30 tablet  3  . metoprolol succinate (TOPROL-XL) 100 MG 24 hr tablet Take 100 mg by mouth daily. Take with or immediately following a meal.      . nitroGLYCERIN (NITROSTAT) 0.4 MG SL tablet Place 1 tablet (0.4 mg total) under the tongue every 5 (five) minutes x 3  doses as needed for chest pain.  25 tablet  3  . Ticagrelor (BRILINTA) 90 MG TABS tablet Take 1 tablet (90 mg total) by mouth 2 (two) times daily.  60 tablet  3    Allergies: Allergies  Allergen Reactions  . Statins     Muscle Weakness  . Macrodantin (Nitrofurantoin Macrocrystal) Rash  . Sulfa Antibiotics Rash    Social History:   reports that she has never smoked. She has never used smokeless tobacco. She reports that she does not drink alcohol or use illicit drugs.   ROS:  Please see the history of present illness.   She has a nonproductive cough.  No melena, hematochezia, hematuria.   All other systems reviewed and negative.   PHYSICAL EXAM: VS:  BP 143/71  Pulse 82  Ht 5\' 8"  (1.727 m)  Wt 221 lb (100.245 kg)  BMI 33.60 kg/m2 Well nourished, well developed, in no acute distress HEENT: normal Neck: no JVD Cardiac:  normal S1, S2;  RRR; no murmur Lungs:  clear to auscultation bilaterally, no wheezing, rhonchi or rales Abd: soft, nontender, no hepatomegaly Ext: no edema; right wrist without hematoma or bruit  Skin: warm and dry Neuro:  CNs 2-12 intact, no focal abnormalities noted  EKG:  NSR, HR 73, normal axis, TWI 1, aVL, V4-6, no change from prior      ASSESSMENT AND PLAN:  1. 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  This is a small vessel and medical Rx recommended at first.  She had fairly significant symptoms yesterday that did respond to NTG.  I d/w Dr. Patty Sermons (DOD).  Will advance medical Rx and add Imdur 30 mg QD.  I will have her follow up next week with me.  She knows to go to the ED if her symptoms recur or worsen.  If she has continued symptoms, will likely need to consider re-look cath with an eye towards PCI of her PL branch.    2. Coronary Artery Disease:   Add nitrates as noted above.  We discussed the importance of dual antiplatelet therapy.  Follow up next week.  3. Hypertension:   Elevated BP today.  Add Imdur as noted.  Check BMET today.   4. Hyperlipidemia:   Statin intolerant.  Continue Zetia.   Luna Glasgow, PA-C  11:08 AM 05/04/2012

## 2012-05-05 ENCOUNTER — Telehealth: Payer: Self-pay | Admitting: *Deleted

## 2012-05-05 NOTE — Discharge Summary (Signed)
Patient did well.  Ready for dc.  Issues reviewed.  See my note.  TS

## 2012-05-05 NOTE — Telephone Encounter (Signed)
Message copied by Tarri Fuller on Wed May 05, 2012  9:16 AM ------      Message from: [place], [place] T      Created: Tue May 04, 2012  5:31 PM       [facility]      Continue with current treatment plan.      Tereso Newcomer, PA-C  5:31 PM 05/04/2012

## 2012-05-10 ENCOUNTER — Ambulatory Visit (INDEPENDENT_AMBULATORY_CARE_PROVIDER_SITE_OTHER): Payer: Medicare Other | Admitting: Physician Assistant

## 2012-05-10 ENCOUNTER — Encounter: Payer: Self-pay | Admitting: Physician Assistant

## 2012-05-10 VITALS — BP 122/73 | HR 73 | Ht 67.0 in | Wt 219.4 lb

## 2012-05-10 DIAGNOSIS — I2510 Atherosclerotic heart disease of native coronary artery without angina pectoris: Secondary | ICD-10-CM | POA: Diagnosis not present

## 2012-05-10 DIAGNOSIS — R079 Chest pain, unspecified: Secondary | ICD-10-CM | POA: Diagnosis not present

## 2012-05-10 DIAGNOSIS — E785 Hyperlipidemia, unspecified: Secondary | ICD-10-CM | POA: Diagnosis not present

## 2012-05-10 DIAGNOSIS — I10 Essential (primary) hypertension: Secondary | ICD-10-CM | POA: Diagnosis not present

## 2012-05-10 DIAGNOSIS — E669 Obesity, unspecified: Secondary | ICD-10-CM | POA: Diagnosis not present

## 2012-05-10 NOTE — Progress Notes (Signed)
[address] [place], [place]  [address] Phone: [phone], Fax:  [phone]  Date:  05/10/2012   Name:  Jody Ruiz   DOB:  [birth year]   MRN:  [id number]  PCP:  Minda Meo, MD  Primary Cardiologist:  Dr.  Shawnie Pons  Primary Electrophysiologist:  None    History of Present Illness: Jody Ruiz is a 71 y.o. female who returns for follow up.  She has a hx of nonobs CAD by cath 2005, HTN, HL, obesity.  She was admitted to [facility] 10/23-10/25 with a NSTEMI.  She had ongoing chest pain with + cardiac markers.  Urgent LHC 04/28/12:  pLAD 90%, mLAD 70-80% (long), pD1 70-80%, prox Int Br 30% (small), CFX 20%, OM1 40%, mOM2 30%, pRCA 30%, PDA 30-40%, pPLB 70%, then mid 90%, EF 65%.  PCI:  Promus DES x 2 to prox and mid LAD (3x30mm and 2.5x42mm).    Med Rx recommended for PLB (small vessel).  Consider PCI if symptomatic.  Post PCI course uneventful.  I saw her last week in followup after hospitalization. She had one episode of angina. I placed on isosorbide and brought her back this week for followup.  Since last seen, she is doing well. She denies any further chest pain. She's been doing her walking without chest symptoms or shortness of breath. She denies orthopnea, PND, edema or syncope. Unfortunately, her husband has been admitted to the hospital for chest pain.  Labs (10/13):   K 3.4=>3.9, creatinine 0.63=>0.8, ALT 26, LDL 96, Hgb 11.7  Wt Readings from Last 3 Encounters:  05/10/12 219 lb 6.4 oz (99.519 kg)  05/04/12 221 lb (100.245 kg)  04/30/12 225 lb 15.5 oz (102.5 kg)     Past Medical History  Diagnosis Date  . Anemia   . Hx of adenomatous colonic polyps   . CAD (coronary artery disease)     a. NSTEMI 10/13 => LHC 04/28/12:  pLAD 90%, mLAD 70-80% (long), pD1 70-80%, prox Int Br 30% (small), CFX 20%, OM1 40%, mOM2 30%, pRCA 30%, PDA 30-40%, pPLB 70%, then mid 90%, EF 65%.  PCI:  Promus DES x 2 to prox and mid LAD (3x53mm and 2.5x27mm).     Med Rx recommended for PLB (small vessel).  Consider PCI if symptomatic.    Marland Kitchen Hypertension   . Microscopic colitis   . Arthritis   . Obesity   . HLD (hyperlipidemia)     intolerant to statins    Current Outpatient Prescriptions  Medication Sig Dispense Refill  . aspirin EC 81 MG tablet Take 81 mg by mouth daily.      . calcium-vitamin D (OSCAL 500/200 D-3) 500-200 MG-UNIT per tablet Take 1 tablet by mouth 2 (two) times daily.      Marland Kitchen ezetimibe (ZETIA) 10 MG tablet Take 1 tablet (10 mg total) by mouth daily.  30 tablet  3  . isosorbide mononitrate (IMDUR) 30 MG 24 hr tablet Take 1 tablet (30 mg total) by mouth daily.  30 tablet  11  . lisinopril (PRINIVIL,ZESTRIL) 10 MG tablet Take 1 tablet (10 mg total) by mouth daily.  30 tablet  3  . metoprolol succinate (TOPROL-XL) 100 MG 24 hr tablet Take 100 mg by mouth daily. Take with or immediately following a meal.      . nitroGLYCERIN (NITROSTAT) 0.4 MG SL tablet Place 1 tablet (0.4 mg total) under the tongue every 5 (five) minutes x 3 doses as needed for chest pain.  25 tablet  3  . Ticagrelor (BRILINTA) 90 MG TABS tablet Take 1 tablet (90 mg total) by mouth 2 (two) times daily.  60 tablet  3    Allergies: Allergies  Allergen Reactions  . Statins     Muscle Weakness  . Macrodantin (Nitrofurantoin Macrocrystal) Rash  . Sulfa Antibiotics Rash    Social History: The patient  reports that she has never smoked. She has never used smokeless tobacco. She reports that she does not drink alcohol or use illicit drugs.   PHYSICAL EXAM: VS:  BP 122/73  Pulse 73  Ht 5\' 7"  (1.702 m)  Wt 219 lb 6.4 oz (99.519 kg)  BMI 34.36 kg/m2 Well nourished, well developed, in no acute distress HEENT: normal Neck: no JVD Cardiac:  normal S1, S2; RRR; no murmur Lungs:  clear to auscultation bilaterally, no wheezing, rhonchi or rales Abd: soft, nontender, no hepatomegaly Ext: no edema  Skin: warm and dry Neuro:  CNs 2-12 intact, no focal abnormalities  noted  EKG:  NSR, HR 73, normal axis, TWI 1, aVL, V4-6, no change from prior      ASSESSMENT AND PLAN:  1. Chest Pain:   She is doing much better. No further chest pain. Continue current therapy.  2. Coronary Artery Disease:   Continue aspirin, Brilinta, isosorbide and Toprol.  Followup with Dr. Riley Kill in one month. We will work on getting her referral completed for cardiac rehabilitation.  3. Hypertension:   Controlled.  Continue current therapy.   4. Hyperlipidemia:   Statin intolerant.  Continue Zetia.   Luna Glasgow, PA-C  10:52 AM 05/10/2012

## 2012-05-10 NOTE — Patient Instructions (Addendum)
Your physician recommends that you schedule a follow-up appointment in: 06/09/12 @ 11:15 WITH DR/ STUCKEY  NO CHANGES WERE MADE TODAY

## 2012-05-20 ENCOUNTER — Telehealth: Payer: Self-pay | Admitting: Cardiology

## 2012-05-20 ENCOUNTER — Encounter (HOSPITAL_COMMUNITY)
Admission: RE | Admit: 2012-05-20 | Discharge: 2012-05-20 | Disposition: A | Discharge status: Home / Self Care | Payer: Medicare Other | Source: Ambulatory Visit | Attending: Cardiology | Admitting: Cardiology

## 2012-05-20 DIAGNOSIS — Z9861 Coronary angioplasty status: Secondary | ICD-10-CM | POA: Insufficient documentation

## 2012-05-20 DIAGNOSIS — I10 Essential (primary) hypertension: Secondary | ICD-10-CM | POA: Insufficient documentation

## 2012-05-20 DIAGNOSIS — Z5189 Encounter for other specified aftercare: Secondary | ICD-10-CM | POA: Insufficient documentation

## 2012-05-20 DIAGNOSIS — E785 Hyperlipidemia, unspecified: Secondary | ICD-10-CM | POA: Insufficient documentation

## 2012-05-20 DIAGNOSIS — Z8249 Family history of ischemic heart disease and other diseases of the circulatory system: Secondary | ICD-10-CM | POA: Insufficient documentation

## 2012-05-20 DIAGNOSIS — I214 Non-ST elevation (NSTEMI) myocardial infarction: Secondary | ICD-10-CM | POA: Insufficient documentation

## 2012-05-20 DIAGNOSIS — Z7982 Long term (current) use of aspirin: Secondary | ICD-10-CM | POA: Insufficient documentation

## 2012-05-20 DIAGNOSIS — E669 Obesity, unspecified: Secondary | ICD-10-CM | POA: Insufficient documentation

## 2012-05-20 DIAGNOSIS — I2510 Atherosclerotic heart disease of native coronary artery without angina pectoris: Secondary | ICD-10-CM | POA: Insufficient documentation

## 2012-05-20 DIAGNOSIS — Z79899 Other long term (current) drug therapy: Secondary | ICD-10-CM | POA: Insufficient documentation

## 2012-05-20 NOTE — Progress Notes (Signed)
Cardiac Rehab Medication Review by a Pharmacist  Does the patient  feel that his/her medications are working for him/her?  yes  Has the patient been experiencing any side effects to the medications prescribed?  yes  Does the patient measure his/her own blood pressure or blood glucose at home?  yes   Does the patient have any problems obtaining medications due to transportation or finances?   yes  Understanding of regimen: excellent Understanding of indications: excellent Potential of compliance: excellent    Pharmacist comments: Pt endorsed a dry hacking cough and feelings of light-headedness when standing too fast from ma sitting position since the initiation of lisinopril. Pt was counseled to call MDs office about cough and symptoms of orthostasis.    Laurence Slate 05/20/2012 9:40 AM

## 2012-05-20 NOTE — Telephone Encounter (Signed)
New problem:    C/o having problem with medication following cath.

## 2012-05-20 NOTE — Telephone Encounter (Signed)
I spoke with the pt and she is complaining of fatigue, dizziness with position changes, dry cough and low BP.  The pt's BP has been 100/62 and 98/65.  I spoke with Dr Riley Kill and at this time he would like the pt to stop Lisinopril.  The pt will have her BP monitored at cardiac rehab.

## 2012-05-24 ENCOUNTER — Encounter (HOSPITAL_COMMUNITY): Payer: Self-pay

## 2012-05-24 ENCOUNTER — Encounter (HOSPITAL_COMMUNITY)
Admission: RE | Admit: 2012-05-24 | Discharge: 2012-05-24 | Disposition: A | Discharge status: Home / Self Care | Payer: Medicare Other | Source: Ambulatory Visit | Attending: Cardiology | Admitting: Cardiology

## 2012-05-24 DIAGNOSIS — Z9861 Coronary angioplasty status: Secondary | ICD-10-CM | POA: Diagnosis not present

## 2012-05-24 DIAGNOSIS — Z5189 Encounter for other specified aftercare: Secondary | ICD-10-CM | POA: Diagnosis not present

## 2012-05-24 DIAGNOSIS — I10 Essential (primary) hypertension: Secondary | ICD-10-CM | POA: Diagnosis not present

## 2012-05-24 DIAGNOSIS — Z8249 Family history of ischemic heart disease and other diseases of the circulatory system: Secondary | ICD-10-CM | POA: Diagnosis not present

## 2012-05-24 DIAGNOSIS — Z7982 Long term (current) use of aspirin: Secondary | ICD-10-CM | POA: Diagnosis not present

## 2012-05-24 DIAGNOSIS — E669 Obesity, unspecified: Secondary | ICD-10-CM | POA: Diagnosis not present

## 2012-05-24 DIAGNOSIS — E785 Hyperlipidemia, unspecified: Secondary | ICD-10-CM | POA: Diagnosis not present

## 2012-05-24 DIAGNOSIS — I2510 Atherosclerotic heart disease of native coronary artery without angina pectoris: Secondary | ICD-10-CM | POA: Diagnosis not present

## 2012-05-24 DIAGNOSIS — Z79899 Other long term (current) drug therapy: Secondary | ICD-10-CM | POA: Diagnosis not present

## 2012-05-24 DIAGNOSIS — I214 Non-ST elevation (NSTEMI) myocardial infarction: Secondary | ICD-10-CM | POA: Diagnosis not present

## 2012-05-24 NOTE — Progress Notes (Signed)
Pt oriented to exercise equipment and routine.  Understanding verbalized.  VSS, telemetry-sinus rhythm with sinus arrhythmia.  Pt denies depression or hopelessness.  Pt states she looks forward to her activities daily. Pt reports she is concerned about her husband and daughters health in addition to her own illness.  She is the caregiver for her husband.  He is also participating in cardiac rehab.   Pt oriented to exercise equipment and routine.  Understanding verbalized.

## 2012-05-26 ENCOUNTER — Encounter (HOSPITAL_COMMUNITY)
Admission: RE | Admit: 2012-05-26 | Discharge: 2012-05-26 | Disposition: A | Discharge status: Home / Self Care | Payer: Medicare Other | Source: Ambulatory Visit | Attending: Orthopedic Surgery | Admitting: Orthopedic Surgery

## 2012-05-28 ENCOUNTER — Encounter (HOSPITAL_COMMUNITY)
Admission: RE | Admit: 2012-05-28 | Discharge: 2012-05-28 | Disposition: A | Discharge status: Home / Self Care | Payer: Medicare Other | Source: Ambulatory Visit | Attending: Orthopedic Surgery | Admitting: Orthopedic Surgery

## 2012-05-30 DIAGNOSIS — R079 Chest pain, unspecified: Secondary | ICD-10-CM | POA: Diagnosis not present

## 2012-05-30 DIAGNOSIS — I10 Essential (primary) hypertension: Secondary | ICD-10-CM

## 2012-05-30 DIAGNOSIS — I2510 Atherosclerotic heart disease of native coronary artery without angina pectoris: Secondary | ICD-10-CM | POA: Diagnosis not present

## 2012-05-31 ENCOUNTER — Encounter (HOSPITAL_COMMUNITY)
Admission: RE | Admit: 2012-05-31 | Discharge: 2012-05-31 | Disposition: A | Discharge status: Home / Self Care | Payer: Medicare Other | Source: Ambulatory Visit | Attending: Cardiology | Admitting: Cardiology

## 2012-06-02 ENCOUNTER — Encounter (HOSPITAL_COMMUNITY)
Admission: RE | Admit: 2012-06-02 | Discharge: 2012-06-02 | Disposition: A | Discharge status: Home / Self Care | Payer: Medicare Other | Source: Ambulatory Visit | Attending: Cardiology | Admitting: Cardiology

## 2012-06-04 ENCOUNTER — Encounter (HOSPITAL_COMMUNITY): Payer: Medicare Other

## 2012-06-07 ENCOUNTER — Other Ambulatory Visit: Payer: Self-pay

## 2012-06-07 ENCOUNTER — Encounter (HOSPITAL_COMMUNITY)
Admission: RE | Admit: 2012-06-07 | Discharge: 2012-06-07 | Disposition: A | Discharge status: Home / Self Care | Payer: Medicare Other | Source: Ambulatory Visit | Attending: Cardiology | Admitting: Cardiology

## 2012-06-07 ENCOUNTER — Telehealth: Payer: Self-pay | Admitting: *Deleted

## 2012-06-07 ENCOUNTER — Ambulatory Visit (HOSPITAL_COMMUNITY)
Admission: RE | Admit: 2012-06-07 | Discharge: 2012-06-07 | Disposition: A | Discharge status: Home / Self Care | Payer: Medicare Other | Source: Ambulatory Visit | Attending: Internal Medicine | Admitting: Internal Medicine

## 2012-06-07 DIAGNOSIS — I2510 Atherosclerotic heart disease of native coronary artery without angina pectoris: Secondary | ICD-10-CM | POA: Insufficient documentation

## 2012-06-07 DIAGNOSIS — I498 Other specified cardiac arrhythmias: Secondary | ICD-10-CM | POA: Insufficient documentation

## 2012-06-07 DIAGNOSIS — I214 Non-ST elevation (NSTEMI) myocardial infarction: Secondary | ICD-10-CM | POA: Insufficient documentation

## 2012-06-07 DIAGNOSIS — R079 Chest pain, unspecified: Secondary | ICD-10-CM | POA: Diagnosis not present

## 2012-06-07 DIAGNOSIS — I10 Essential (primary) hypertension: Secondary | ICD-10-CM | POA: Insufficient documentation

## 2012-06-07 DIAGNOSIS — Z9861 Coronary angioplasty status: Secondary | ICD-10-CM | POA: Insufficient documentation

## 2012-06-07 DIAGNOSIS — Z5189 Encounter for other specified aftercare: Secondary | ICD-10-CM | POA: Insufficient documentation

## 2012-06-07 DIAGNOSIS — E669 Obesity, unspecified: Secondary | ICD-10-CM | POA: Insufficient documentation

## 2012-06-07 DIAGNOSIS — E785 Hyperlipidemia, unspecified: Secondary | ICD-10-CM | POA: Insufficient documentation

## 2012-06-07 DIAGNOSIS — Z8249 Family history of ischemic heart disease and other diseases of the circulatory system: Secondary | ICD-10-CM | POA: Insufficient documentation

## 2012-06-07 DIAGNOSIS — Z7982 Long term (current) use of aspirin: Secondary | ICD-10-CM | POA: Insufficient documentation

## 2012-06-07 DIAGNOSIS — Z79899 Other long term (current) drug therapy: Secondary | ICD-10-CM | POA: Insufficient documentation

## 2012-06-07 NOTE — Progress Notes (Signed)
Pt c/o left scapular discomfort, rates 5/10 states she awoke this morning with this discomfort.  Sensitive to touch and neck movement.  Pt also c/o chest tightness 3/10 persistent all day.  Phone call to Dr. Rosalyn Charters office to report.  Spoke to [place].  Ok to get 12 lead EKG and fax to office for eval.   Pt to keep appt with Dr. Riley Kill this week as scheduled.  14:05  While sitting in treatment room, chest discomfort increased to 7/10 increasing associated with belching,  O2 4L [place] applied, 12 lead EKG obtained.  BP- 132/74.  sinsu bradycardia on monitor with sinus pauses, longest 1.9 sec.  Chest tightness decreased to 5/10.  14:20 chest tightness completely resolved, shoulder discomfort completely resolved.  EKG faxed to Dr. Rosalyn Charters office.BP-162/64.  14:40  pt remained symptom free sitting up with oxygen removed.    HR-60's sinus rhythm, no pauses.  BP- 132/74.  14:45  Phone call received from [facility]. Per Dr. Elease Hashimoto, pt should f/u with PCP for shoulder discomfort.  Keep appt with Dr Riley Kill as scheduled. Pt pain free, sinus rhythm, HR-60. Proper use of NTG reviewed with pt, pt advised to present to ED for worsening, unrelieved chest discomfort.  Understanding verbalized.

## 2012-06-07 NOTE — Telephone Encounter (Signed)
SPOKE WITH JOANN FROM CARDIAC REHAB  PT C/O  LEFT SHOULDER BLADE DISCOMFORT   AWOKE WITH IT THIS AM  ALSO NOTED SOME CHEST TIGHTNESS  AND  HAS DRY COUGH  CHEST TIGHTNESS HAS RESOLVED  CONTINUES TO C/O  SHOULDER PAIN  CAN  MOVE NECK AND  INTENSIFY THE PAIN HURTS ALSO WITH TOUCH   HAS  APPT WITH DR Riley Kill ON 06-09-12  INFORMED PT TO CALL DR ARONSONS OFFICE  RE  SHOULDER PAIN . PER PT HURTS MORE WITH MOVEMENT  OR TOUCH.   ENCOURAGED PT TO TRY NTG IF CHEST TIGHTNESS RETURNS  OR IF S/S INCREASED  AND AFTER 3 NTG NO RELIEF  TO GO TO NEAREST ER FOR EVAL AND TX .PT VERBALIZED UNDERSTANDING /CY

## 2012-06-09 ENCOUNTER — Other Ambulatory Visit (INDEPENDENT_AMBULATORY_CARE_PROVIDER_SITE_OTHER): Payer: Medicare Other

## 2012-06-09 ENCOUNTER — Encounter (HOSPITAL_COMMUNITY): Admission: RE | Admit: 2012-06-09 | Payer: Medicare Other | Source: Ambulatory Visit

## 2012-06-09 ENCOUNTER — Encounter: Payer: Self-pay | Admitting: Cardiology

## 2012-06-09 ENCOUNTER — Ambulatory Visit (INDEPENDENT_AMBULATORY_CARE_PROVIDER_SITE_OTHER): Payer: Medicare Other | Admitting: Cardiology

## 2012-06-09 ENCOUNTER — Ambulatory Visit (INDEPENDENT_AMBULATORY_CARE_PROVIDER_SITE_OTHER)
Admission: RE | Admit: 2012-06-09 | Discharge: 2012-06-09 | Disposition: A | Discharge status: Home / Self Care | Payer: Medicare Other | Source: Ambulatory Visit | Attending: Cardiology | Admitting: Cardiology

## 2012-06-09 VITALS — BP 130/72 | HR 55 | Ht 67.0 in | Wt 221.0 lb

## 2012-06-09 DIAGNOSIS — R05 Cough: Secondary | ICD-10-CM | POA: Diagnosis not present

## 2012-06-09 DIAGNOSIS — I10 Essential (primary) hypertension: Secondary | ICD-10-CM

## 2012-06-09 DIAGNOSIS — E785 Hyperlipidemia, unspecified: Secondary | ICD-10-CM | POA: Diagnosis not present

## 2012-06-09 DIAGNOSIS — I2510 Atherosclerotic heart disease of native coronary artery without angina pectoris: Secondary | ICD-10-CM

## 2012-06-09 DIAGNOSIS — R0602 Shortness of breath: Secondary | ICD-10-CM | POA: Diagnosis not present

## 2012-06-09 LAB — CBC WITH DIFFERENTIAL/PLATELET
Basophils Absolute: 0.1 10*3/uL (ref 0.0–0.1)
Basophils Relative: 0.7 % (ref 0.0–3.0)
Eosinophils Absolute: 0.2 10*3/uL (ref 0.0–0.7)
HCT: 39.9 % (ref 36.0–46.0)
Hemoglobin: 13.3 g/dL (ref 12.0–15.0)
Lymphocytes Relative: 27.2 % (ref 12.0–46.0)
Lymphs Abs: 2.5 10*3/uL (ref 0.7–4.0)
MCHC: 33.3 g/dL (ref 30.0–36.0)
MCV: 95.4 fl (ref 78.0–100.0)
Monocytes Absolute: 0.7 10*3/uL (ref 0.1–1.0)
Neutro Abs: 5.8 10*3/uL (ref 1.4–7.7)
RBC: 4.18 Mil/uL (ref 3.87–5.11)
RDW: 13.1 % (ref 11.5–14.6)

## 2012-06-09 LAB — BASIC METABOLIC PANEL
BUN: 19 mg/dL (ref 6–23)
Calcium: 9.3 mg/dL (ref 8.4–10.5)
Chloride: 103 mEq/L (ref 96–112)
Creatinine, Ser: 0.7 mg/dL (ref 0.4–1.2)
GFR: 89.51 mL/min (ref >60.00)

## 2012-06-09 MED ORDER — AMLODIPINE BESYLATE 5 MG PO TABS: 5.0000 mg | ORAL_TABLET | Freq: Every day | ORAL | Status: DC

## 2012-06-09 MED ORDER — CLOPIDOGREL BISULFATE 75 MG PO TABS: 75.0000 mg | ORAL_TABLET | Freq: Every day | ORAL | Status: DC

## 2012-06-09 NOTE — Telephone Encounter (Signed)
The pt is scheduled to see Dr Riley Kill on 06/09/12.

## 2012-06-09 NOTE — Assessment & Plan Note (Signed)
The patient has some symptoms worsen which are somewhat atypical. Part of them could be from the use of ticagrelor.  As such, we will stop her drug, and replace this with clopidogrel. She will be given 300 mg tomorrow then 75 mg a day thereafter. The patient is out more than 30 days from the initial presentation and implantation. We will also reduce her dose of metoprolol to 50 mg a day, and to control her blood pressure we will add amlodipine 5 mg per day. I will also get a chest x-ray as this was not done in the hospital, and she has pain with inspiration. Her ECG does not appear to be dramatically different from when she was in the hospital. I will plan to see her back in followup early next week and we will see how she does. We will also have a low threshold for looking out for something unusual such as a pulmonary embolus. She's to return to rehabilitation on Friday, and they can call me if there are any changes in how she is doing

## 2012-06-09 NOTE — Assessment & Plan Note (Signed)
Reduce metoprolol and add amlodipine.  See CAD.

## 2012-06-09 NOTE — Patient Instructions (Addendum)
Your physician has recommended you make the following change in your medication:  1. Decrease Metoprolol Succinate to 50mg  take one by mouth daily (take one-half of a 100mg  tablet) 2. START Amlodipine 5mg  take one by mouth daily 3. STOP Brilinta after your dose tonight 4. START Plavix 300mg  (four 75mg  tablets) on 06/10/12 and then decrease to Plavix 75mg  take one by mouth daily starting 06/11/12  A chest x-ray takes a picture of the organs and structures inside the chest, including the heart, lungs, and blood vessels. This test can show several things, including, whether the heart is enlarges; whether fluid is building up in the lungs; and whether pacemaker / defibrillator leads are still in place.  Your physician recommends that you have lab work today: BMP and CBC  Your physician recommends that you schedule a follow-up appointment in: 1 WEEK   The patient can resume cardiac rehab.

## 2012-06-09 NOTE — Progress Notes (Signed)
HPI:  This comes in for followup. Importantly, she was at rehabilitation the other day. Prior to going to rehabilitation, she experienced left scapular pain. Persisted. She was seen in rehabilitation and also had a little bit of chest discomfort, but is also noted some dyspnea. This is gradually improved and she is been okay over the past couple of days. The exact mechanism for this is been unclear. She is also noted dry cough at night.  Current Outpatient Prescriptions  Medication Sig Dispense Refill  . aspirin EC 81 MG tablet Take 81 mg by mouth daily.      . calcium-vitamin D (OSCAL 500/200 D-3) 500-200 MG-UNIT per tablet Take 1 tablet by mouth 2 (two) times daily.      Marland Kitchen ezetimibe (ZETIA) 10 MG tablet Take 1 tablet (10 mg total) by mouth daily.  30 tablet  3  . isosorbide mononitrate (IMDUR) 30 MG 24 hr tablet Take 1 tablet (30 mg total) by mouth daily.  30 tablet  11  . metoprolol succinate (TOPROL-XL) 100 MG 24 hr tablet Take 100 mg by mouth daily. Take with or immediately following a meal.      . nitroGLYCERIN (NITROSTAT) 0.4 MG SL tablet Place 1 tablet (0.4 mg total) under the tongue every 5 (five) minutes x 3 doses as needed for chest pain.  25 tablet  3  . Ticagrelor (BRILINTA) 90 MG TABS tablet Take 1 tablet (90 mg total) by mouth 2 (two) times daily.  60 tablet  3    Allergies  Allergen Reactions  . Statins     Muscle Weakness  . Macrodantin (Nitrofurantoin Macrocrystal) Rash  . Sulfa Antibiotics Rash    Past Medical History  Diagnosis Date  . Anemia   . Hx of adenomatous colonic polyps   . CAD (coronary artery disease)     a. NSTEMI 10/13 => LHC 04/28/12:  pLAD 90%, mLAD 70-80% (long), pD1 70-80%, prox Int Br 30% (small), CFX 20%, OM1 40%, mOM2 30%, pRCA 30%, PDA 30-40%, pPLB 70%, then mid 90%, EF 65%.  PCI:  Promus DES x 2 to prox and mid LAD (3x69mm and 2.5x85mm).    Med Rx recommended for PLB (small vessel).  Consider PCI if symptomatic.    Marland Kitchen Hypertension   .  Microscopic colitis   . Arthritis   . Obesity   . HLD (hyperlipidemia)     intolerant to statins    Past Surgical History  Procedure Date  . Appendectomy   . Cholecystectomy   . Ventral hernia repair   . Breast biopsy     bilateral  . Knee arthroscopy     bilateral  . Vaginal polypectomy   . Shoulder arthroscopy 2012    left  . Cardiac catheterization     Family History  Problem Relation Age of Onset  . Breast cancer Sister   . Prostate cancer Father   . Colon cancer Sister   . Colon polyps Father   . Heart disease Mother   . Heart disease Father   . Heart disease Brother   . Heart disease Sister   . Kidney disease Father     History   Social History  . Marital Status: Married    Spouse Name: N/A    Number of Children: N/A  . Years of Education: N/A   Occupational History  . Retired Charity fundraiser    Social History Main Topics  . Smoking status: Never Smoker   . Smokeless tobacco: Never Used  .  Alcohol Use: No  . Drug Use: No  . Sexually Active: Not on file   Other Topics Concern  . Not on file   Social History Narrative  . No narrative on file    ROS: Please see the HPI.  All other systems reviewed and negative.  PHYSICAL EXAM:  BP 130/72  Pulse 55  Ht 5\' 7"  (1.702 m)  Wt 221 lb (100.245 kg)  BMI 34.61 kg/m2  SpO2 98%  General: Well developed, well nourished, in no acute distress. Head:  Normocephalic and atraumatic. Neck: no JVD Lungs: Clear to auscultation and percussion.  No wheezes Heart: Normal S1 and S2.  No murmur, rubs or gallops.  Abdomen:  Normal bowel sounds; soft; non tender; no organomegaly Pulses: Pulses normal in all 4 extremities. Extremities: No clubbing or cyanosis. No edema. Neurologic: Alert and oriented x 3.  EKG:  SB.  Lateral T inversion in I,AVL, and V4-V6.  One pause ? Non conducted PAC.    CATH:  Name: Jody Ruiz  MRN: [id number]  DOB: 1940/10/21  Procedure: Left Heart Cath, Selective Coronary Angiography, LV  angiography, PTCA and stenting of the proximal and mid LAD  Indication: 71 year old white female with history of hypertension and hyperlipidemia. She has a history of nonobstructive coronary disease by cardiac catheterization in 2004. She presents with a non-ST elevation myocardial infarction. Troponin is 1.76. ECG shows T-wave inversion in the lateral leads.  Procedural Details: The right wrist was prepped, draped, and anesthetized with 1% lidocaine. Using the modified Seldinger technique, a 5 French sheath was introduced into the right radial artery. 3 mg of verapamil was administered through the sheath, weight-based unfractionated heparin was administered intravenously. Standard Judkins catheters were used for selective coronary angiography and left ventriculography. Catheter exchanges were performed over an exchange length guidewire.  PROCEDURAL FINDINGS  Hemodynamics:  AO 161/67 with a mean of 102 mmHg  LV 162/20 mmHg.  Coronary angiography:  Coronary dominance: right  Left mainstem: Normal.  Left anterior descending (LAD): The LAD is moderately calcified in the proximal vessel. There is a focal 90% stenosis in the proximal vessel at the takeoff of the first septal perforator and first diagonal. In the mid vessel there is a long segment of disease up to 70-80% from the second septal perforator past the second diagonal. The diagonal branches are small. The first diagonal has 70-80% disease proximally.  There is a small intermediate branch that has minor disease proximally up to 30%.  Left circumflex (LCx): The left circumflex coronary gives rise to 2 marginal branches. The circumflex has mild nonobstructive disease up to 20%. The first marginal branch has a 40% focal stenosis at its takeoff. The second marginal branch has 30% disease in the mid vessel.  Right coronary artery (RCA): The right coronary is a dominant vessel. There is 30% disease in the proximal vessel. The right coronary gives rise to  the PDA and a posterior lateral branch distally. The PDA has mild disease up to 30-40%. The posterior lateral branch has a 70% stenosis proximally followed by a 90% stenosis in the mid vessel.  Left ventriculography: Left ventricular systolic function is normal, LVEF is estimated at 65%, there is no significant mitral regurgitation  PCI Note: Following the diagnostic procedure, the decision was made to proceed with PCI of the proximal and mid LAD. The radial sheath was upsized to a 6 [place]. Weight-based bivalirudin was given for anticoagulation. Brilinta 180 mg was given PO. Once a therapeutic ACT was achieved,  a 6 [place] SL 3.5 guide catheter was inserted. A pro-water coronary guidewire was used to cross the lesion. The lesions were predilated with a 2.5 mm balloon. The lesion in the mid LAD was then stented with a 2.5 x 28 mm Promus stent. The stent was postdilated with a 2.75 mm noncompliant balloon. The lesion in the proximal LAD was then stented with a 3.0 x 16 mm Promus stent. The stent was postdilated with a 3.25 mm noncompliant balloon. Following PCI, there was 0% residual stenosis and TIMI-3 flow. Final angiography confirmed an excellent result. There was no compromise of the diagonal or septal perforator side branches.The patient tolerated the procedure well. There were no immediate procedural complications. A TR band was used for radial hemostasis. The patient was transferred to the post catheterization recovery area for further monitoring.  PCI Data:  Vessel - LAD/Segment - proximal and mid  Percent Stenosis (pre) 90% and 80% respectively  TIMI-flow 3  Stent 3.0 x 16 mm Promus and 2.5 x 28 mm Promus stents respectively  Percent Stenosis (post) 0% for both lesions.  TIMI-flow (post) 3  Final Conclusions:  1. 2 vessel obstructive atherosclerotic coronary disease.  2. Normal left ventricular function.  3. Successful intracoronary stenting of the proximal and mid LAD with drug-eluting stents.    Recommendations:  Continue dual antiplatelet therapy for one year. The posterior lateral branch is small and I would treated initially with medication and optimal blood pressure control. If she remains significantly symptomatic this could be potentially stented. Since she is statin intolerant we will start her on Zetia 10 mg daily.  Theron Arista [facility]  04/29/2012, 10:09 AM       ASSESSMENT AND PLAN:

## 2012-06-11 ENCOUNTER — Encounter (HOSPITAL_COMMUNITY)
Admission: RE | Admit: 2012-06-11 | Discharge: 2012-06-11 | Disposition: A | Discharge status: Home / Self Care | Payer: Medicare Other | Source: Ambulatory Visit | Attending: Cardiology | Admitting: Cardiology

## 2012-06-11 DIAGNOSIS — I2510 Atherosclerotic heart disease of native coronary artery without angina pectoris: Secondary | ICD-10-CM | POA: Diagnosis not present

## 2012-06-11 DIAGNOSIS — Z5189 Encounter for other specified aftercare: Secondary | ICD-10-CM | POA: Diagnosis not present

## 2012-06-11 DIAGNOSIS — Z79899 Other long term (current) drug therapy: Secondary | ICD-10-CM | POA: Diagnosis not present

## 2012-06-11 DIAGNOSIS — E669 Obesity, unspecified: Secondary | ICD-10-CM | POA: Diagnosis not present

## 2012-06-11 DIAGNOSIS — I214 Non-ST elevation (NSTEMI) myocardial infarction: Secondary | ICD-10-CM | POA: Diagnosis not present

## 2012-06-11 DIAGNOSIS — Z8249 Family history of ischemic heart disease and other diseases of the circulatory system: Secondary | ICD-10-CM | POA: Diagnosis not present

## 2012-06-11 DIAGNOSIS — Z9861 Coronary angioplasty status: Secondary | ICD-10-CM | POA: Diagnosis not present

## 2012-06-11 DIAGNOSIS — E785 Hyperlipidemia, unspecified: Secondary | ICD-10-CM | POA: Diagnosis not present

## 2012-06-11 DIAGNOSIS — Z7982 Long term (current) use of aspirin: Secondary | ICD-10-CM | POA: Diagnosis not present

## 2012-06-11 DIAGNOSIS — I10 Essential (primary) hypertension: Secondary | ICD-10-CM | POA: Diagnosis not present

## 2012-06-14 ENCOUNTER — Ambulatory Visit: Payer: Medicare Other | Admitting: Cardiology

## 2012-06-14 ENCOUNTER — Encounter (HOSPITAL_COMMUNITY)
Admission: RE | Admit: 2012-06-14 | Discharge: 2012-06-14 | Disposition: A | Discharge status: Home / Self Care | Payer: Medicare Other | Source: Ambulatory Visit | Attending: Cardiology | Admitting: Cardiology

## 2012-06-16 ENCOUNTER — Encounter (HOSPITAL_COMMUNITY): Payer: Medicare Other

## 2012-06-18 ENCOUNTER — Ambulatory Visit (INDEPENDENT_AMBULATORY_CARE_PROVIDER_SITE_OTHER): Payer: Medicare Other | Admitting: Cardiology

## 2012-06-18 ENCOUNTER — Encounter: Payer: Self-pay | Admitting: Cardiology

## 2012-06-18 ENCOUNTER — Encounter (HOSPITAL_COMMUNITY)
Admission: RE | Admit: 2012-06-18 | Discharge: 2012-06-18 | Disposition: A | Discharge status: Home / Self Care | Payer: Medicare Other | Source: Ambulatory Visit | Attending: Cardiology | Admitting: Cardiology

## 2012-06-18 VITALS — BP 118/66 | HR 76 | Ht 67.0 in | Wt 222.8 lb

## 2012-06-18 DIAGNOSIS — E785 Hyperlipidemia, unspecified: Secondary | ICD-10-CM | POA: Diagnosis not present

## 2012-06-18 DIAGNOSIS — I2510 Atherosclerotic heart disease of native coronary artery without angina pectoris: Secondary | ICD-10-CM

## 2012-06-18 DIAGNOSIS — I10 Essential (primary) hypertension: Secondary | ICD-10-CM

## 2012-06-18 NOTE — Progress Notes (Signed)
HPI:  The patient returns today in followup she is doing much better she denies any ongoing chest pain. She says she feels quite a bit better as well. She's been taking both her pulse and blood pressure, and these had been substantially okay. She denies any new symptoms. She would like to go back to rehabilitation at this point in time.  Current Outpatient Prescriptions  Medication Sig Dispense Refill  . amLODipine (NORVASC) 5 MG tablet Take 1 tablet (5 mg total) by mouth daily.  30 tablet  3  . aspirin EC 81 MG tablet Take 81 mg by mouth daily.      . calcium-vitamin D (OSCAL 500/200 D-3) 500-200 MG-UNIT per tablet Take 1 tablet by mouth 2 (two) times daily.      . clopidogrel (PLAVIX) 75 MG tablet Take 1 tablet (75 mg total) by mouth daily.  30 tablet  3  . ezetimibe (ZETIA) 10 MG tablet Take 1 tablet (10 mg total) by mouth daily.  30 tablet  3  . isosorbide mononitrate (IMDUR) 30 MG 24 hr tablet Take 1 tablet (30 mg total) by mouth daily.  30 tablet  11  . metoprolol succinate (TOPROL-XL) 50 MG 24 hr tablet Take 1 tablet (50 mg total) by mouth daily. Take with or immediately following a meal.      . nitroGLYCERIN (NITROSTAT) 0.4 MG SL tablet Place 1 tablet (0.4 mg total) under the tongue every 5 (five) minutes x 3 doses as needed for chest pain.  25 tablet  3    Allergies  Allergen Reactions  . Statins     Muscle Weakness  . Macrodantin (Nitrofurantoin Macrocrystal) Rash  . Sulfa Antibiotics Rash    Past Medical History  Diagnosis Date  . Anemia   . Hx of adenomatous colonic polyps   . CAD (coronary artery disease)     a. NSTEMI 10/13 => LHC 04/28/12:  pLAD 90%, mLAD 70-80% (long), pD1 70-80%, prox Int Br 30% (small), CFX 20%, OM1 40%, mOM2 30%, pRCA 30%, PDA 30-40%, pPLB 70%, then mid 90%, EF 65%.  PCI:  Promus DES x 2 to prox and mid LAD (3x56mm and 2.5x10mm).    Med Rx recommended for PLB (small vessel).  Consider PCI if symptomatic.    Marland Kitchen Hypertension   . Microscopic colitis     . Arthritis   . Obesity   . HLD (hyperlipidemia)     intolerant to statins    Past Surgical History  Procedure Date  . Appendectomy   . Cholecystectomy   . Ventral hernia repair   . Breast biopsy     bilateral  . Knee arthroscopy     bilateral  . Vaginal polypectomy   . Shoulder arthroscopy 2012    left  . Cardiac catheterization     Family History  Problem Relation Age of Onset  . Breast cancer Sister   . Prostate cancer Father   . Colon cancer Sister   . Colon polyps Father   . Heart disease Mother   . Heart disease Father   . Heart disease Brother   . Heart disease Sister   . Kidney disease Father     History   Social History  . Marital Status: Married    Spouse Name: N/A    Number of Children: N/A  . Years of Education: N/A   Occupational History  . Retired Charity fundraiser    Social History Main Topics  . Smoking status: Never Smoker   .  Smokeless tobacco: Never Used  . Alcohol Use: No  . Drug Use: No  . Sexually Active: Not on file   Other Topics Concern  . Not on file   Social History Narrative  . No narrative on file    ROS: Please see the HPI.  All other systems reviewed and negative.  PHYSICAL EXAM:  BP 118/66  Pulse 76  Ht 5\' 7"  (1.702 m)  Wt 222 lb 12.8 oz (101.061 kg)  BMI 34.90 kg/m2  SpO2 98%  General: Well developed, well nourished, in no acute distress. Head:  Normocephalic and atraumatic. Neck: no JVD Lungs: Clear to auscultation and percussion. Heart: Normal S1 and S2.  No murmur, rubs or gallops.  Pulses: Pulses normal in all 4 extremities. Neurologic: Alert and oriented x 3.  EKG:  ASSESSMENT AND PLAN:

## 2012-06-18 NOTE — Patient Instructions (Addendum)
Your physician recommends that you schedule a follow-up appointment in: 1 MONTH with Dr Riley Kill  Your physician has recommended you make the following change in your medication: TAKE Toprol XL (brand name only) 50mg  take one by mouth daily  Patient can resume cardiac rehab with no restrictions.

## 2012-06-18 NOTE — Assessment & Plan Note (Signed)
The BPs today seem ok.

## 2012-06-18 NOTE — Assessment & Plan Note (Signed)
The patient is stable and seems improved.  Her HR is in the 70s and BP ok.  We will stick the new regimen, and I will see her back in follow up in one month.  In the interim, she can resume her rehab program with all machines without limitation.  No new symptoms.

## 2012-06-21 ENCOUNTER — Encounter (HOSPITAL_COMMUNITY)
Admission: RE | Admit: 2012-06-21 | Discharge: 2012-06-21 | Disposition: A | Discharge status: Home / Self Care | Payer: Medicare Other | Source: Ambulatory Visit | Attending: Cardiology | Admitting: Cardiology

## 2012-06-23 ENCOUNTER — Encounter (HOSPITAL_COMMUNITY)
Admission: RE | Admit: 2012-06-23 | Discharge: 2012-06-23 | Disposition: A | Discharge status: Home / Self Care | Payer: Medicare Other | Source: Ambulatory Visit | Attending: Cardiology | Admitting: Cardiology

## 2012-06-25 ENCOUNTER — Encounter (HOSPITAL_COMMUNITY)
Admission: RE | Admit: 2012-06-25 | Discharge: 2012-06-25 | Disposition: A | Discharge status: Home / Self Care | Payer: Medicare Other | Source: Ambulatory Visit | Attending: Cardiology | Admitting: Cardiology

## 2012-06-28 ENCOUNTER — Encounter (HOSPITAL_COMMUNITY)
Admission: RE | Admit: 2012-06-28 | Discharge: 2012-06-28 | Disposition: A | Discharge status: Home / Self Care | Payer: Medicare Other | Source: Ambulatory Visit | Attending: Cardiology | Admitting: Cardiology

## 2012-07-02 ENCOUNTER — Encounter (HOSPITAL_COMMUNITY)
Admission: RE | Admit: 2012-07-02 | Discharge: 2012-07-02 | Disposition: A | Discharge status: Home / Self Care | Payer: Medicare Other | Source: Ambulatory Visit | Attending: Cardiology | Admitting: Cardiology

## 2012-07-05 ENCOUNTER — Encounter (HOSPITAL_COMMUNITY)
Admission: RE | Admit: 2012-07-05 | Discharge: 2012-07-05 | Disposition: A | Discharge status: Home / Self Care | Payer: Medicare Other | Source: Ambulatory Visit | Attending: Cardiology | Admitting: Cardiology

## 2012-07-07 ENCOUNTER — Encounter (HOSPITAL_COMMUNITY): Payer: Medicare Other

## 2012-07-09 ENCOUNTER — Encounter (HOSPITAL_COMMUNITY)
Admission: RE | Admit: 2012-07-09 | Discharge: 2012-07-09 | Disposition: A | Discharge status: Home / Self Care | Payer: Medicare Other | Source: Ambulatory Visit | Attending: Cardiology | Admitting: Cardiology

## 2012-07-09 DIAGNOSIS — Z5189 Encounter for other specified aftercare: Secondary | ICD-10-CM | POA: Diagnosis not present

## 2012-07-09 DIAGNOSIS — Z7982 Long term (current) use of aspirin: Secondary | ICD-10-CM | POA: Insufficient documentation

## 2012-07-09 DIAGNOSIS — Z9861 Coronary angioplasty status: Secondary | ICD-10-CM | POA: Insufficient documentation

## 2012-07-09 DIAGNOSIS — E785 Hyperlipidemia, unspecified: Secondary | ICD-10-CM | POA: Insufficient documentation

## 2012-07-09 DIAGNOSIS — I214 Non-ST elevation (NSTEMI) myocardial infarction: Secondary | ICD-10-CM | POA: Diagnosis not present

## 2012-07-09 DIAGNOSIS — I10 Essential (primary) hypertension: Secondary | ICD-10-CM | POA: Insufficient documentation

## 2012-07-09 DIAGNOSIS — Z8249 Family history of ischemic heart disease and other diseases of the circulatory system: Secondary | ICD-10-CM | POA: Diagnosis not present

## 2012-07-09 DIAGNOSIS — Z79899 Other long term (current) drug therapy: Secondary | ICD-10-CM | POA: Insufficient documentation

## 2012-07-09 DIAGNOSIS — I2510 Atherosclerotic heart disease of native coronary artery without angina pectoris: Secondary | ICD-10-CM | POA: Insufficient documentation

## 2012-07-09 DIAGNOSIS — E669 Obesity, unspecified: Secondary | ICD-10-CM | POA: Insufficient documentation

## 2012-07-12 ENCOUNTER — Encounter (HOSPITAL_COMMUNITY)
Admission: RE | Admit: 2012-07-12 | Discharge: 2012-07-12 | Disposition: A | Discharge status: Home / Self Care | Payer: Medicare Other | Source: Ambulatory Visit | Attending: Cardiology | Admitting: Cardiology

## 2012-07-14 ENCOUNTER — Encounter (HOSPITAL_COMMUNITY)
Admission: RE | Admit: 2012-07-14 | Discharge: 2012-07-14 | Disposition: A | Discharge status: Home / Self Care | Payer: Medicare Other | Source: Ambulatory Visit | Attending: Cardiology | Admitting: Cardiology

## 2012-07-16 ENCOUNTER — Other Ambulatory Visit (INDEPENDENT_AMBULATORY_CARE_PROVIDER_SITE_OTHER): Payer: Medicare Other

## 2012-07-16 ENCOUNTER — Ambulatory Visit (INDEPENDENT_AMBULATORY_CARE_PROVIDER_SITE_OTHER): Payer: Medicare Other | Admitting: Cardiology

## 2012-07-16 ENCOUNTER — Ambulatory Visit (INDEPENDENT_AMBULATORY_CARE_PROVIDER_SITE_OTHER)
Admission: RE | Admit: 2012-07-16 | Discharge: 2012-07-16 | Disposition: A | Discharge status: Home / Self Care | Payer: Medicare Other | Source: Ambulatory Visit | Attending: Cardiology | Admitting: Cardiology

## 2012-07-16 ENCOUNTER — Encounter: Payer: Self-pay | Admitting: Cardiology

## 2012-07-16 ENCOUNTER — Encounter (HOSPITAL_COMMUNITY)
Admission: RE | Admit: 2012-07-16 | Discharge: 2012-07-16 | Disposition: A | Discharge status: Home / Self Care | Payer: Medicare Other | Source: Ambulatory Visit | Attending: Cardiology | Admitting: Cardiology

## 2012-07-16 VITALS — BP 137/75 | HR 73 | Ht 67.0 in | Wt 224.0 lb

## 2012-07-16 DIAGNOSIS — I2510 Atherosclerotic heart disease of native coronary artery without angina pectoris: Secondary | ICD-10-CM

## 2012-07-16 DIAGNOSIS — R05 Cough: Secondary | ICD-10-CM

## 2012-07-16 DIAGNOSIS — J209 Acute bronchitis, unspecified: Secondary | ICD-10-CM | POA: Insufficient documentation

## 2012-07-16 LAB — CBC WITH DIFFERENTIAL/PLATELET
Basophils Relative: 0.7 % (ref 0.0–3.0)
Eosinophils Absolute: 0.2 10*3/uL (ref 0.0–0.7)
Eosinophils Relative: 2.8 % (ref 0.0–5.0)
Lymphocytes Relative: 32.5 % (ref 12.0–46.0)
MCV: 94.1 fl (ref 78.0–100.0)
Monocytes Absolute: 0.6 10*3/uL (ref 0.1–1.0)
Neutrophils Relative %: 55.5 % (ref 43.0–77.0)
Platelets: 319 10*3/uL (ref 150.0–400.0)
RBC: 4.01 Mil/uL (ref 3.87–5.11)
WBC: 7.6 10*3/uL (ref 4.5–10.5)

## 2012-07-16 NOTE — Patient Instructions (Addendum)
Lab: cbc at the [place] office  Today 2 view chest xray  Dr. Riley Kill will see back in March 09/16/12 at 11:15 am  Continue your current medications  Dr. Riley Kill has referred you to cardiologist: Dr. Peter [place]

## 2012-07-16 NOTE — Assessment & Plan Note (Signed)
She's had no further symptoms since she was last seen. She's remained in a rehabilitation program continues to do well. I will see her back in followup in 2 months

## 2012-07-16 NOTE — Assessment & Plan Note (Signed)
The patient looks and feels well. She does have fairly prominent crackles in the right lung base. Her daughter has pneumonia. The patient denies any fever. We will get a white count today, and I will go ahead and get a chest x-ray as her exam reveals fairly clearcut crackles in right base.

## 2012-07-16 NOTE — Progress Notes (Signed)
HPI:  Is in for followup. She is really doing well. She's had no further chest pain. She is noted to have a little bit of cough, and her daughter has pneumonia. She's not had fever or chills.  Current Outpatient Prescriptions  Medication Sig Dispense Refill  . amLODipine (NORVASC) 5 MG tablet Take 1 tablet (5 mg total) by mouth daily.  30 tablet  3  . aspirin EC 81 MG tablet Take 81 mg by mouth daily.      . calcium-vitamin D (OSCAL 500/200 D-3) 500-200 MG-UNIT per tablet Take 1 tablet by mouth 2 (two) times daily.      . clopidogrel (PLAVIX) 75 MG tablet Take 1 tablet (75 mg total) by mouth daily.  30 tablet  3  . ezetimibe (ZETIA) 10 MG tablet Take 1 tablet (10 mg total) by mouth daily.  30 tablet  3  . isosorbide mononitrate (IMDUR) 30 MG 24 hr tablet Take 1 tablet (30 mg total) by mouth daily.  30 tablet  11  . metoprolol succinate (TOPROL XL) 50 MG 24 hr tablet Take 1 tablet (50 mg total) by mouth daily. Take with or immediately following a meal.      . nitroGLYCERIN (NITROSTAT) 0.4 MG SL tablet Place 1 tablet (0.4 mg total) under the tongue every 5 (five) minutes x 3 doses as needed for chest pain.  25 tablet  3    Allergies  Allergen Reactions  . Statins     Muscle Weakness  . Macrodantin (Nitrofurantoin Macrocrystal) Rash  . Sulfa Antibiotics Rash    Past Medical History  Diagnosis Date  . Anemia   . Hx of adenomatous colonic polyps   . CAD (coronary artery disease)     a. NSTEMI 10/13 => LHC 04/28/12:  pLAD 90%, mLAD 70-80% (long), pD1 70-80%, prox Int Br 30% (small), CFX 20%, OM1 40%, mOM2 30%, pRCA 30%, PDA 30-40%, pPLB 70%, then mid 90%, EF 65%.  PCI:  Promus DES x 2 to prox and mid LAD (3x65mm and 2.5x57mm).    Med Rx recommended for PLB (small vessel).  Consider PCI if symptomatic.    Marland Kitchen Hypertension   . Microscopic colitis   . Arthritis   . Obesity   . HLD (hyperlipidemia)     intolerant to statins    Past Surgical History  Procedure Date  . Appendectomy   .  Cholecystectomy   . Ventral hernia repair   . Breast biopsy     bilateral  . Knee arthroscopy     bilateral  . Vaginal polypectomy   . Shoulder arthroscopy 2012    left  . Cardiac catheterization     Family History  Problem Relation Age of Onset  . Breast cancer Sister   . Prostate cancer Father   . Colon cancer Sister   . Colon polyps Father   . Heart disease Mother   . Heart disease Father   . Heart disease Brother   . Heart disease Sister   . Kidney disease Father     History   Social History  . Marital Status: Married    Spouse Name: N/A    Number of Children: N/A  . Years of Education: N/A   Occupational History  . Retired Charity fundraiser    Social History Main Topics  . Smoking status: Never Smoker   . Smokeless tobacco: Never Used  . Alcohol Use: No  . Drug Use: No  . Sexually Active: Not on file  Other Topics Concern  . Not on file   Social History Narrative  . No narrative on file    ROS: Please see the HPI.  All other systems reviewed and negative.  PHYSICAL EXAM:  BP 137/75  Pulse 73  Ht 5\' 7"  (1.702 m)  Wt 224 lb (101.606 kg)  BMI 35.08 kg/m2  SpO2 97%  General: Well developed, well nourished, in no acute distress. Head:  Normocephalic and atraumatic. Neck: no JVD Lungs: Prominent crackles in R base. Marland Kitchen Heart: Normal S1 and S2.  No murmur, rubs or gallops.  Abdomen:  Normal bowel sounds; soft; non tender; no organomegaly Pulses: Pulses normal in all 4 extremities. Extremities: No clubbing or cyanosis. No edema. Neurologic: Alert and oriented x 3.  EKG:  Not done  ASSESSMENT AND PLAN:

## 2012-07-19 ENCOUNTER — Encounter (HOSPITAL_COMMUNITY)
Admission: RE | Admit: 2012-07-19 | Discharge: 2012-07-19 | Disposition: A | Discharge status: Home / Self Care | Payer: Medicare Other | Source: Ambulatory Visit | Attending: Cardiology | Admitting: Cardiology

## 2012-07-21 ENCOUNTER — Encounter (HOSPITAL_COMMUNITY)
Admission: RE | Admit: 2012-07-21 | Discharge: 2012-07-21 | Disposition: A | Discharge status: Home / Self Care | Payer: Medicare Other | Source: Ambulatory Visit | Attending: Cardiology | Admitting: Cardiology

## 2012-07-23 ENCOUNTER — Encounter: Payer: Self-pay | Admitting: Physician Assistant

## 2012-07-23 ENCOUNTER — Encounter (HOSPITAL_COMMUNITY): Payer: Medicare Other

## 2012-07-23 DIAGNOSIS — E039 Hypothyroidism, unspecified: Secondary | ICD-10-CM | POA: Diagnosis not present

## 2012-07-23 DIAGNOSIS — R7301 Impaired fasting glucose: Secondary | ICD-10-CM | POA: Diagnosis not present

## 2012-07-23 DIAGNOSIS — R82998 Other abnormal findings in urine: Secondary | ICD-10-CM | POA: Diagnosis not present

## 2012-07-23 DIAGNOSIS — E785 Hyperlipidemia, unspecified: Secondary | ICD-10-CM | POA: Diagnosis not present

## 2012-07-23 DIAGNOSIS — I10 Essential (primary) hypertension: Secondary | ICD-10-CM | POA: Diagnosis not present

## 2012-07-26 ENCOUNTER — Encounter (HOSPITAL_COMMUNITY)
Admission: RE | Admit: 2012-07-26 | Discharge: 2012-07-26 | Disposition: A | Discharge status: Home / Self Care | Payer: Medicare Other | Source: Ambulatory Visit | Attending: Cardiology | Admitting: Cardiology

## 2012-07-28 ENCOUNTER — Encounter (HOSPITAL_COMMUNITY)
Admission: RE | Admit: 2012-07-28 | Discharge: 2012-07-28 | Disposition: A | Discharge status: Home / Self Care | Payer: Medicare Other | Source: Ambulatory Visit | Attending: Cardiology | Admitting: Cardiology

## 2012-07-30 ENCOUNTER — Encounter (HOSPITAL_COMMUNITY): Payer: Medicare Other

## 2012-07-30 DIAGNOSIS — I2510 Atherosclerotic heart disease of native coronary artery without angina pectoris: Secondary | ICD-10-CM | POA: Diagnosis not present

## 2012-07-30 DIAGNOSIS — E785 Hyperlipidemia, unspecified: Secondary | ICD-10-CM | POA: Diagnosis not present

## 2012-07-30 DIAGNOSIS — Z1331 Encounter for screening for depression: Secondary | ICD-10-CM | POA: Diagnosis not present

## 2012-07-30 DIAGNOSIS — Z0000 Encounter for general adult medical examination without abnormal findings: Secondary | ICD-10-CM | POA: Diagnosis not present

## 2012-07-30 DIAGNOSIS — I10 Essential (primary) hypertension: Secondary | ICD-10-CM | POA: Diagnosis not present

## 2012-07-31 ENCOUNTER — Encounter: Payer: Self-pay | Admitting: Physician Assistant

## 2012-08-02 ENCOUNTER — Encounter (HOSPITAL_COMMUNITY): Payer: Self-pay | Admitting: General Practice

## 2012-08-02 ENCOUNTER — Inpatient Hospital Stay (HOSPITAL_COMMUNITY)
Admission: AD | Admit: 2012-08-02 | Discharge: 2012-08-03 | DRG: 287 | Disposition: A | Discharge status: Home / Self Care | Payer: Medicare Other | Source: Ambulatory Visit | Attending: Cardiovascular Disease | Admitting: Cardiovascular Disease

## 2012-08-02 ENCOUNTER — Encounter (HOSPITAL_COMMUNITY): Payer: Medicare Other

## 2012-08-02 ENCOUNTER — Ambulatory Visit (INDEPENDENT_AMBULATORY_CARE_PROVIDER_SITE_OTHER): Payer: Medicare Other | Admitting: Physician Assistant

## 2012-08-02 ENCOUNTER — Observation Stay (HOSPITAL_COMMUNITY): Payer: Medicare Other

## 2012-08-02 ENCOUNTER — Encounter: Payer: Self-pay | Admitting: Physician Assistant

## 2012-08-02 VITALS — BP 138/76 | HR 66 | Ht 67.0 in | Wt 222.1 lb

## 2012-08-02 DIAGNOSIS — Z7982 Long term (current) use of aspirin: Secondary | ICD-10-CM

## 2012-08-02 DIAGNOSIS — Z7902 Long term (current) use of antithrombotics/antiplatelets: Secondary | ICD-10-CM | POA: Diagnosis not present

## 2012-08-02 DIAGNOSIS — Z9861 Coronary angioplasty status: Secondary | ICD-10-CM

## 2012-08-02 DIAGNOSIS — R079 Chest pain, unspecified: Secondary | ICD-10-CM

## 2012-08-02 DIAGNOSIS — Z803 Family history of malignant neoplasm of breast: Secondary | ICD-10-CM | POA: Diagnosis not present

## 2012-08-02 DIAGNOSIS — Z800 Family history of malignant neoplasm of digestive organs: Secondary | ICD-10-CM

## 2012-08-02 DIAGNOSIS — I2510 Atherosclerotic heart disease of native coronary artery without angina pectoris: Secondary | ICD-10-CM

## 2012-08-02 DIAGNOSIS — E785 Hyperlipidemia, unspecified: Secondary | ICD-10-CM | POA: Diagnosis present

## 2012-08-02 DIAGNOSIS — I252 Old myocardial infarction: Secondary | ICD-10-CM | POA: Diagnosis not present

## 2012-08-02 DIAGNOSIS — E669 Obesity, unspecified: Secondary | ICD-10-CM | POA: Diagnosis present

## 2012-08-02 DIAGNOSIS — I200 Unstable angina: Secondary | ICD-10-CM | POA: Diagnosis present

## 2012-08-02 DIAGNOSIS — Z8249 Family history of ischemic heart disease and other diseases of the circulatory system: Secondary | ICD-10-CM | POA: Diagnosis not present

## 2012-08-02 DIAGNOSIS — I10 Essential (primary) hypertension: Secondary | ICD-10-CM | POA: Diagnosis present

## 2012-08-02 DIAGNOSIS — Z1212 Encounter for screening for malignant neoplasm of rectum: Secondary | ICD-10-CM | POA: Diagnosis not present

## 2012-08-02 DIAGNOSIS — Z8042 Family history of malignant neoplasm of prostate: Secondary | ICD-10-CM | POA: Diagnosis not present

## 2012-08-02 DIAGNOSIS — Z79899 Other long term (current) drug therapy: Secondary | ICD-10-CM | POA: Diagnosis not present

## 2012-08-02 DIAGNOSIS — Z888 Allergy status to other drugs, medicaments and biological substances status: Secondary | ICD-10-CM

## 2012-08-02 DIAGNOSIS — Z6834 Body mass index (BMI) 34.0-34.9, adult: Secondary | ICD-10-CM | POA: Diagnosis not present

## 2012-08-02 DIAGNOSIS — Z882 Allergy status to sulfonamides status: Secondary | ICD-10-CM

## 2012-08-02 HISTORY — DX: Acute myocardial infarction, unspecified: I21.9

## 2012-08-02 LAB — CBC WITH DIFFERENTIAL/PLATELET
Basophils Relative: 0 % (ref 0–1)
Eosinophils Absolute: 0.2 10*3/uL (ref 0.0–0.7)
Hemoglobin: 12.5 g/dL (ref 12.0–15.0)
Lymphs Abs: 2.2 10*3/uL (ref 0.7–4.0)
Monocytes Relative: 8 % (ref 3–12)
Neutro Abs: 4.3 10*3/uL (ref 1.7–7.7)
Neutrophils Relative %: 60 % (ref 43–77)
Platelets: 284 10*3/uL (ref 150–400)
RBC: 3.99 MIL/uL (ref 3.87–5.11)
WBC: 7.2 10*3/uL (ref 4.0–10.5)

## 2012-08-02 LAB — COMPREHENSIVE METABOLIC PANEL
ALT: 25 U/L (ref 0–35)
Albumin: 3.8 g/dL (ref 3.5–5.2)
Alkaline Phosphatase: 60 U/L (ref 39–117)
BUN: 22 mg/dL (ref 6–23)
Chloride: 101 mEq/L (ref 96–112)
Glucose, Bld: 123 mg/dL — ABNORMAL HIGH (ref 70–99)
Potassium: 3.5 mEq/L (ref 3.5–5.1)
Sodium: 138 mEq/L (ref 135–145)
Total Bilirubin: 0.3 mg/dL (ref 0.3–1.2)
Total Protein: 7.2 g/dL (ref 6.0–8.3)

## 2012-08-02 LAB — HEPARIN LEVEL (UNFRACTIONATED): Heparin Unfractionated: 0.31 IU/mL (ref 0.30–0.70)

## 2012-08-02 LAB — PROTIME-INR
INR: 0.93 (ref 0.00–1.49)
Prothrombin Time: 12.4 seconds (ref 11.6–15.2)

## 2012-08-02 LAB — PRO B NATRIURETIC PEPTIDE: Pro B Natriuretic peptide (BNP): 154.9 pg/mL — ABNORMAL HIGH (ref 0–125)

## 2012-08-02 LAB — APTT: aPTT: 32 seconds (ref 24–37)

## 2012-08-02 MED ORDER — ACETAMINOPHEN 325 MG PO TABS: 650.0000 mg | ORAL_TABLET | ORAL | Status: DC | PRN

## 2012-08-02 MED ORDER — ONDANSETRON HCL 4 MG/2ML IJ SOLN: 4.0000 mg | Freq: Four times a day (QID) | INTRAMUSCULAR | Status: DC | PRN

## 2012-08-02 MED ORDER — CALCIUM CARBONATE-VITAMIN D 500-200 MG-UNIT PO TABS
1.0000 | ORAL_TABLET | Freq: Two times a day (BID) | ORAL | Status: DC
  Administered 2012-08-02: 1 via ORAL
  Filled 2012-08-02 (×4): qty 1

## 2012-08-02 MED ORDER — SODIUM CHLORIDE 0.9 % IV SOLN
INTRAVENOUS | Status: DC
  Administered 2012-08-03: 1000 mL via INTRAVENOUS

## 2012-08-02 MED ORDER — SODIUM CHLORIDE 0.9 % IJ SOLN: 3.0000 mL | INTRAMUSCULAR | Status: DC | PRN

## 2012-08-02 MED ORDER — SODIUM CHLORIDE 0.9 % IV SOLN: 250.0000 mL | INTRAVENOUS | Status: DC | PRN

## 2012-08-02 MED ORDER — SODIUM CHLORIDE 0.9 % IV SOLN
250.0000 mL | INTRAVENOUS | Status: DC | PRN
  Administered 2012-08-02: 250 mL via INTRAVENOUS

## 2012-08-02 MED ORDER — FUROSEMIDE 10 MG/ML IJ SOLN
40.0000 mg | Freq: Once | INTRAMUSCULAR | Status: AC
  Administered 2012-08-02: 40 mg via INTRAVENOUS
  Filled 2012-08-02: qty 4

## 2012-08-02 MED ORDER — HEPARIN (PORCINE) IN NACL 100-0.45 UNIT/ML-% IJ SOLN
1050.0000 [IU]/h | INTRAMUSCULAR | Status: DC
  Administered 2012-08-02: 1050 [IU]/h via INTRAVENOUS
  Filled 2012-08-02 (×2): qty 250

## 2012-08-02 MED ORDER — AMLODIPINE BESYLATE 5 MG PO TABS
5.0000 mg | ORAL_TABLET | Freq: Every day | ORAL | Status: DC
  Filled 2012-08-02 (×2): qty 1

## 2012-08-02 MED ORDER — HEPARIN BOLUS VIA INFUSION
4000.0000 [IU] | Freq: Once | INTRAVENOUS | Status: AC
  Administered 2012-08-02: 4000 [IU] via INTRAVENOUS
  Filled 2012-08-02: qty 4000

## 2012-08-02 MED ORDER — METOPROLOL SUCCINATE ER 50 MG PO TB24
50.0000 mg | ORAL_TABLET | Freq: Every day | ORAL | Status: DC
  Administered 2012-08-03: 50 mg via ORAL
  Filled 2012-08-02 (×2): qty 1

## 2012-08-02 MED ORDER — SODIUM CHLORIDE 0.9 % IJ SOLN
3.0000 mL | Freq: Two times a day (BID) | INTRAMUSCULAR | Status: DC
  Administered 2012-08-02: 3 mL via INTRAVENOUS

## 2012-08-02 MED ORDER — POTASSIUM CHLORIDE CRYS ER 20 MEQ PO TBCR
20.0000 meq | EXTENDED_RELEASE_TABLET | Freq: Once | ORAL | Status: AC
  Administered 2012-08-02: 20 meq via ORAL
  Filled 2012-08-02: qty 1

## 2012-08-02 MED ORDER — CLOPIDOGREL BISULFATE 75 MG PO TABS
75.0000 mg | ORAL_TABLET | Freq: Every day | ORAL | Status: DC
  Administered 2012-08-03: 75 mg via ORAL
  Filled 2012-08-02 (×2): qty 1

## 2012-08-02 MED ORDER — ASPIRIN EC 81 MG PO TBEC
81.0000 mg | DELAYED_RELEASE_TABLET | Freq: Every day | ORAL | Status: DC
  Filled 2012-08-02 (×2): qty 1

## 2012-08-02 MED ORDER — NITROGLYCERIN 0.4 MG SL SUBL: 0.4000 mg | SUBLINGUAL_TABLET | SUBLINGUAL | Status: DC | PRN

## 2012-08-02 MED ORDER — EZETIMIBE 10 MG PO TABS
10.0000 mg | ORAL_TABLET | Freq: Every day | ORAL | Status: DC
  Administered 2012-08-02: 10 mg via ORAL
  Filled 2012-08-02 (×2): qty 1

## 2012-08-02 MED ORDER — ISOSORBIDE MONONITRATE ER 30 MG PO TB24
30.0000 mg | ORAL_TABLET | Freq: Every day | ORAL | Status: DC
  Filled 2012-08-02 (×2): qty 1

## 2012-08-02 MED ORDER — ASPIRIN 81 MG PO CHEW
324.0000 mg | CHEWABLE_TABLET | ORAL | Status: AC
  Administered 2012-08-03: 324 mg via ORAL
  Filled 2012-08-02: qty 4

## 2012-08-02 NOTE — Patient Instructions (Addendum)
YOU ARE BEING ADMITTED TO [facility] TO THE 2000 UNIT FOR CHEST PAIN

## 2012-08-02 NOTE — Progress Notes (Signed)
[address], [address] [place], [place]  [address] Phone: [phone], Fax:  [phone]  Date:  08/02/2012   ID:  Jody Ruiz, Jody Ruiz July 28, 1940, MRN [id number]  PCP:  Minda Meo, MD  Primary Cardiologist:  Dr.  Shawnie Pons     History of Present Illness: Jody Ruiz is a 72 y.o. female who returns for evaluation of chest pain.  She has a hx of CAD, status post non-STEMI 10/13, HTN, HL, obesity.  LHC 04/28/12: pLAD 90%, mLAD 70-80% (long), pD1 70-80%, prox Int Br 30% (small), CFX 20%, OM1 40%, mOM2 30%, pRCA 30%, PDA 30-40%, pPLB 70%, then mid 90%, EF 65%. PCI: Promus DES x 2 to prox and mid LAD (3x8mm and 2.5x61mm). Med Rx recommended for PLB (small vessel). Consider PCI if symptomatic.  Post discharge, she was placed on isosorbide for recurrent angina. In December when she saw Dr. Riley Kill, she complained of dyspnea and some chest discomfort. He changed her Ticagrelor to clopidogrel for suspected side effects. Last seen by Dr. Riley Kill 07/16/12.  She notes progressive dyspnea with exertion over the last couple of weeks. She also notes left-sided chest discomfort that she describes as heavy at times and sharp at others. There is radiation to her left shoulder. She notes fatigue and decreased exercise tolerance. She denies orthopnea. She does note that her pain awakens her from sleep sometimes. This is associated with shortness of breath. She denies pedal edema. She denies syncope. Her chest pain typically occurs at rest. She denies associated nausea or diaphoresis. She feels that her symptoms are reminiscent of her symptoms prior to her non-STEMI.  Labs (10/13): K 3.4=>3.9, creatinine 0.63=>0.8, ALT 26, LDL 96, Hgb 11.7 Labs (12/13): K 3.9, creatinine 0.7, Hgb 13.3 Labs (1/14):   K 4.6, Creatinine 0.71, ALT 24, LDL 102, Hgb 12.8, TSH 4.260  CXR 07/16/12: Mild elevation right hemidiaphragm; no acute abnormality  Wt Readings from Last 3 Encounters:  08/02/12  222 lb 1.9 oz (100.753 kg)  07/16/12 224 lb (101.606 kg)  06/18/12 222 lb 12.8 oz (101.061 kg)     Past Medical History  Diagnosis Date  . Anemia   . Hx of adenomatous colonic polyps   . CAD (coronary artery disease)     a. NSTEMI 10/13 => LHC 04/28/12:  pLAD 90%, mLAD 70-80% (long), pD1 70-80%, prox Int Br 30% (small), CFX 20%, OM1 40%, mOM2 30%, pRCA 30%, PDA 30-40%, pPLB 70%, then mid 90%, EF 65%.  PCI:  Promus DES x 2 to prox and mid LAD (3x33mm and 2.5x62mm).    Med Rx recommended for PLB (small vessel).  Consider PCI if symptomatic.    Marland Kitchen Hypertension   . Microscopic colitis   . Arthritis   . Obesity   . HLD (hyperlipidemia)     intolerant to statins  . Dyspnea     a. CP and SOB 06/2012 => Ticagrelor changed to Plavix    Current Outpatient Prescriptions  Medication Sig Dispense Refill  . amLODipine (NORVASC) 5 MG tablet Take 1 tablet (5 mg total) by mouth daily.  30 tablet  3  . aspirin EC 81 MG tablet Take 81 mg by mouth daily.      . calcium-vitamin D (OSCAL 500/200 D-3) 500-200 MG-UNIT per tablet Take 1 tablet by mouth 2 (two) times daily.      . clopidogrel (PLAVIX) 75 MG tablet Take 1 tablet (75 mg total) by mouth daily.  30 tablet  3  . ezetimibe (ZETIA)  10 MG tablet Take 1 tablet (10 mg total) by mouth daily.  30 tablet  3  . isosorbide mononitrate (IMDUR) 30 MG 24 hr tablet Take 1 tablet (30 mg total) by mouth daily.  30 tablet  11  . metoprolol succinate (TOPROL XL) 50 MG 24 hr tablet Take 1 tablet (50 mg total) by mouth daily. Take with or immediately following a meal.      . nitroGLYCERIN (NITROSTAT) 0.4 MG SL tablet Place 1 tablet (0.4 mg total) under the tongue every 5 (five) minutes x 3 doses as needed for chest pain.  25 tablet  3    Allergies:    Allergies  Allergen Reactions  . Statins     Muscle Weakness  . Macrodantin (Nitrofurantoin Macrocrystal) Rash  . Sulfa Antibiotics Rash    Social History:  The patient  reports that she has never smoked. She  has never used smokeless tobacco. She reports that she does not drink alcohol or use illicit drugs.   Family History:  The patient's family history includes Breast cancer in her sister; Colon cancer in her sister; Colon polyps in her father; Heart disease in her brother, father, mother, and sister; Kidney disease in her father; and Prostate cancer in her father.   ROS:  Please see the history of present illness.   She continues with a dry, nonproductive cough. It is worse at night.   All other systems reviewed and negative.   PHYSICAL EXAM: VS:  BP 138/76  Pulse 66  Ht 5\' 7"  (1.702 m)  Wt 222 lb 1.9 oz (100.753 kg)  BMI 34.79 kg/m2 Well nourished, well developed, in no acute distress HEENT: normal Neck: no JVD Cardiac:  normal S1, S2; RRR; no murmur Lungs:  Bibasilar rales Abd: soft, nontender, no hepatomegaly Ext: no edema Skin: warm and dry Neuro:  CNs 2-12 intact, no focal abnormalities noted  EKG:  NSR, HR 66, normal axis, T-wave inversions in 1, aVL, V4-V6, no significant change since prior tracing     ASSESSMENT AND PLAN:  1. Unstable Angina:  She's had progressive symptoms of dyspnea with exertion as well as chest discomfort reminiscent of her symptoms prior to her non-STEMI in 10/13. I recommend admission to the hospital for observation. I discussed this with Dr. Elease Hashimoto who also saw the patient. He agrees. We will place her on IV heparin and cycle enzymes. She has rales at her bases and we will give her one dose of IV Lasix to diurese her some. Plan on cardiac catheterization tomorrow. Risks and benefits of cardiac catheterization have been discussed with the patient.  These include bleeding, infection, kidney damage, stroke, heart attack, death.  The patient understands these risks and is willing to proceed. She had a posterior lateral branch that was treated medically. We may need to consider PCI of this vessel. 2. Coronary Artery Disease:  Continue aspirin and Plavix. Proceed  with cardiac catheterization as noted. 3. Hypertension:  Continue Toprol and amlodipine. 4. Hyperlipidemia:  She is intolerant to statins. Continue Zetia.  Luna Glasgow, PA-C  8:32 AM 08/02/2012

## 2012-08-02 NOTE — Progress Notes (Signed)
ANTICOAGULATION CONSULT NOTE  Pharmacy Consult for Heparin  Indication: chest pain/ACS  Allergies  Allergen Reactions  . Brilinta (Ticagrelor) Shortness Of Breath and Cough    fatigue  . Lisinopril Shortness Of Breath and Cough  . Statins     Muscle Weakness  . Macrodantin (Nitrofurantoin Macrocrystal) Rash  . Sulfa Antibiotics Rash    Patient Measurements: Height: 5\' 7"  (170.2 cm) Weight: 222 lb 1.9 oz (100.752 kg) IBW/kg (Calculated) : 61.6  Heparin Dosing Weight: 84 kg  Vital Signs: Temp: 97.9 F (36.6 C) (01/27 1935) Temp src: Oral (01/27 1935) BP: 130/58 mmHg (01/27 1935) Pulse Rate: 64  (01/27 1935)  Labs:  Basename 08/02/12 2240 08/02/12 1743 08/02/12 1221  HGB -- -- 12.5  HCT -- -- 36.8  PLT -- -- 284  APTT -- -- 32  LABPROT -- -- 12.4  INR -- -- 0.93  HEPARINUNFRC 0.31 -- --  CREATININE -- -- 0.66  CKTOTAL -- -- --  CKMB -- -- --  TROPONINI -- <0.30 <0.30    Estimated Creatinine Clearance: 81 ml/min (by C-G formula based on Cr of 0.66).  Assessment: 72 yo female with chest pain for heparin  Goal of Therapy:  Heparin level 0.3-0.7 units/ml Monitor platelets by anticoagulation protocol: Yes   Plan:  Continue Heparin at current rate  Follow-up am labs.  Geannie Risen, PharmD, BCPS  08/02/2012,11:41 PM

## 2012-08-02 NOTE — H&P (Signed)
Admission History and Physical  Date:  08/02/2012   ID:  Jody Ruiz, DOB [birth year], MRN [id number]  PCP:  Minda Meo, MD  Primary Cardiologist:  Dr.  Shawnie Pons     History of Present Illness: Jody Ruiz is a 72 y.o. female who returns for evaluation of chest pain.  She has a hx of CAD, status post non-STEMI 10/13, HTN, HL, obesity.  LHC 04/28/12: pLAD 90%, mLAD 70-80% (long), pD1 70-80%, prox Int Br 30% (small), CFX 20%, OM1 40%, mOM2 30%, pRCA 30%, PDA 30-40%, pPLB 70%, then mid 90%, EF 65%. PCI: Promus DES x 2 to prox and mid LAD (3x25mm and 2.5x42mm). Med Rx recommended for PLB (small vessel). Consider PCI if symptomatic.  Post discharge, she was placed on isosorbide for recurrent angina. In December when she saw Dr. Riley Kill, she complained of dyspnea and some chest discomfort. He changed her Ticagrelor to clopidogrel for suspected side effects. Last seen by Dr. Riley Kill 07/16/12.  She notes progressive dyspnea with exertion over the last couple of weeks. She also notes left-sided chest discomfort that she describes as heavy at times and sharp at others. There is radiation to her left shoulder. She notes fatigue and decreased exercise tolerance. She denies orthopnea. She does note that her pain awakens her from sleep sometimes. This is associated with shortness of breath. She denies pedal edema. She denies syncope. Her chest pain typically occurs at rest. She denies associated nausea or diaphoresis. She feels that her symptoms are reminiscent of her symptoms prior to her non-STEMI.  Labs (10/13): K 3.4=>3.9, creatinine 0.63=>0.8, ALT 26, LDL 96, Hgb 11.7 Labs (12/13): K 3.9, creatinine 0.7, Hgb 13.3 Labs (1/14):   K 4.6, Creatinine 0.71, ALT 24, LDL 102, Hgb 12.8, TSH 4.260  CXR 07/16/12: Mild elevation right hemidiaphragm; no acute abnormality  Wt Readings from Last 3 Encounters:  08/02/12 222 lb 1.9 oz (100.753 kg)  07/16/12 224 lb (101.606 kg)  06/18/12 222  lb 12.8 oz (101.061 kg)     Past Medical History  Diagnosis Date  . Anemia   . Hx of adenomatous colonic polyps   . CAD (coronary artery disease)     a. NSTEMI 10/13 => LHC 04/28/12:  pLAD 90%, mLAD 70-80% (long), pD1 70-80%, prox Int Br 30% (small), CFX 20%, OM1 40%, mOM2 30%, pRCA 30%, PDA 30-40%, pPLB 70%, then mid 90%, EF 65%.  PCI:  Promus DES x 2 to prox and mid LAD (3x1mm and 2.5x74mm).    Med Rx recommended for PLB (small vessel).  Consider PCI if symptomatic.    Marland Kitchen Hypertension   . Microscopic colitis   . Arthritis   . Obesity   . HLD (hyperlipidemia)     intolerant to statins  . Dyspnea     a. CP and SOB 06/2012 => Ticagrelor changed to Plavix    Current Outpatient Prescriptions  Medication Sig Dispense Refill  . amLODipine (NORVASC) 5 MG tablet Take 1 tablet (5 mg total) by mouth daily.  30 tablet  3  . aspirin EC 81 MG tablet Take 81 mg by mouth daily.      . calcium-vitamin D (OSCAL 500/200 D-3) 500-200 MG-UNIT per tablet Take 1 tablet by mouth 2 (two) times daily.      . clopidogrel (PLAVIX) 75 MG tablet Take 1 tablet (75 mg total) by mouth daily.  30 tablet  3  . ezetimibe (ZETIA) 10 MG tablet Take 1 tablet (10 mg total) by mouth daily.  30 tablet  3  . isosorbide mononitrate (IMDUR) 30 MG 24 hr tablet Take 1 tablet (30 mg total) by mouth daily.  30 tablet  11  . metoprolol succinate (TOPROL XL) 50 MG 24 hr tablet Take 1 tablet (50 mg total) by mouth daily. Take with or immediately following a meal.      . nitroGLYCERIN (NITROSTAT) 0.4 MG SL tablet Place 1 tablet (0.4 mg total) under the tongue every 5 (five) minutes x 3 doses as needed for chest pain.  25 tablet  3    Allergies:    Allergies  Allergen Reactions  . Statins     Muscle Weakness  . Macrodantin (Nitrofurantoin Macrocrystal) Rash  . Sulfa Antibiotics Rash    Social History:  The patient  reports that she has never smoked. She has never used smokeless tobacco. She reports that she does not drink  alcohol or use illicit drugs.   Family History:  The patient's family history includes Breast cancer in her sister; Colon cancer in her sister; Colon polyps in her father; Heart disease in her brother, father, mother, and sister; Kidney disease in her father; and Prostate cancer in her father.   ROS:  Please see the history of present illness.   She continues with a dry, nonproductive cough. It is worse at night.   All other systems reviewed and negative.   PHYSICAL EXAM: VS:  BP 138/76  Pulse 66  Ht 5\' 7"  (1.702 m)  Wt 222 lb 1.9 oz (100.753 kg)  BMI 34.79 kg/m2 Well nourished, well developed, in no acute distress HEENT: normal Neck: no JVD Cardiac:  normal S1, S2; RRR; no murmur Lungs:  Bibasilar rales Abd: soft, nontender, no hepatomegaly Ext: no edema Skin: warm and dry Neuro:  CNs 2-12 intact, no focal abnormalities noted  EKG:  NSR, HR 66, normal axis, T-wave inversions in 1, aVL, V4-V6, no significant change since prior tracing     ASSESSMENT AND PLAN:  1. Unstable Angina:  She's had progressive symptoms of dyspnea with exertion as well as chest discomfort reminiscent of her symptoms prior to her non-STEMI in 10/13. I recommend admission to the hospital for observation. I discussed this with Dr. Elease Hashimoto who also saw the patient. He agrees. We will place her on IV heparin and cycle enzymes. She has rales at her bases and we will give her one dose of IV Lasix to diurese her some. Plan on cardiac catheterization tomorrow. Risks and benefits of cardiac catheterization have been discussed with the patient.  These include bleeding, infection, kidney damage, stroke, heart attack, death.  The patient understands these risks and is willing to proceed. She had a posterior lateral branch that was treated medically. We may need to consider PCI of this vessel. 2. Coronary Artery Disease:  Continue aspirin and Plavix. Proceed with cardiac catheterization as noted. 3. Hypertension:  Continue  Toprol and amlodipine. 4. Hyperlipidemia:  She is intolerant to statins. Continue Zetia.  Signed, Tereso Newcomer, PA-C  8:32 AM 08/02/2012     Attending Note:   The patient was seen and examined.  Agree with assessment and plan as noted above.  Changes made as needed.  Westlyn is a very pleasant women with a hx of CAD who presents with symptoms similar to her presenting symptoms last year when she was found to have significant CAD - fatigue , progressive DOE, chest tightness.  She has noticed a definite decline in her ability to do her daily activities.  She  has not tried to take any NTG.    Exam is as above.  She has rales in both bases.  Imp:    CAD: I think she needs a cardiac cath.  She has some residual disease and may have some worsening of her CAD.    Possible  CHF:  She has rales on exam - denies cough or fever.  I would give her a dose of Lasix and see if she improves.    HTN:  Stable   Alvia Grove., MD, [facility] 08/03/2012, 8:05 AM

## 2012-08-02 NOTE — Progress Notes (Signed)
ANTICOAGULATION CONSULT NOTE - Initial Consult  Pharmacy Consult for Heparin  Indication: chest pain/ACS  Allergies  Allergen Reactions  . Statins     Muscle Weakness  . Macrodantin (Nitrofurantoin Macrocrystal) Rash  . Sulfa Antibiotics Rash    Patient Measurements: Height: 5\' 7"  (170.2 cm) Weight: 222 lb 1.9 oz (100.752 kg) IBW/kg (Calculated) : 61.6  Heparin Dosing Weight: 84 kg  Vital Signs: Temp: 98 F (36.7 C) (01/27 1131) Temp src: Oral (01/27 1131) BP: 137/74 mmHg (01/27 1131) Pulse Rate: 85  (01/27 1131)  Labs: No results found for this basename: HGB:2,HCT:3,PLT:3,APTT:3,LABPROT:3,INR:3,HEPARINUNFRC:3,CREATININE:3,CKTOTAL:3,CKMB:3,TROPONINI:3 in the last 72 hours  Estimated Creatinine Clearance: 81 ml/min (by C-G formula based on Cr of 0.7).   Medical History: Past Medical History  Diagnosis Date  . Anemia   . Hx of adenomatous colonic polyps   . CAD (coronary artery disease)     a. NSTEMI 10/13 => LHC 04/28/12:  pLAD 90%, mLAD 70-80% (long), pD1 70-80%, prox Int Br 30% (small), CFX 20%, OM1 40%, mOM2 30%, pRCA 30%, PDA 30-40%, pPLB 70%, then mid 90%, EF 65%.  PCI:  Promus DES x 2 to prox and mid LAD (3x68mm and 2.5x21mm).    Med Rx recommended for PLB (small vessel).  Consider PCI if symptomatic.    Marland Kitchen Hypertension   . Microscopic colitis   . Arthritis   . Obesity   . HLD (hyperlipidemia)     intolerant to statins  . Dyspnea     a. CP and SOB 06/2012 => Ticagrelor changed to Plavix    Assessment: 4 YOF with hx of CAD who has experienced progressive DOE and left sided chest discomfort in the last couple of weeks. She is admitted for observation and pharmacy is consulted to start IV heparin. Plan for cardiac cath tomorrow. Pt doesn't take anticoagulants prior to admission. Baseline PT and aPTT are pending.  Goal of Therapy:  Heparin level 0.3-0.7 units/ml Monitor platelets by anticoagulation protocol: Yes   Plan:  - Heparin bolus 4000 units - Heparin  infusion 1050 untis/hr - Heparin level at 1900 - Daily heparin level and cbc  Bayard Hugger, PharmD, BCPS  Clinical Pharmacist  Pager: [phone]  08/02/2012,12:25 PM

## 2012-08-03 ENCOUNTER — Encounter (HOSPITAL_COMMUNITY): Payer: Self-pay | Admitting: Nurse Practitioner

## 2012-08-03 ENCOUNTER — Encounter (HOSPITAL_COMMUNITY)
Admission: AD | Disposition: A | Discharge status: Home / Self Care | Payer: Self-pay | Source: Ambulatory Visit | Attending: Cardiovascular Disease

## 2012-08-03 DIAGNOSIS — I2510 Atherosclerotic heart disease of native coronary artery without angina pectoris: Secondary | ICD-10-CM

## 2012-08-03 HISTORY — PX: LEFT HEART CATHETERIZATION WITH CORONARY ANGIOGRAM: SHX5451

## 2012-08-03 LAB — CBC
HCT: 37.7 % (ref 36.0–46.0)
MCHC: 34 g/dL (ref 30.0–36.0)
Platelets: 273 10*3/uL (ref 150–400)
RDW: 12.7 % (ref 11.5–15.5)
WBC: 7.3 10*3/uL (ref 4.0–10.5)

## 2012-08-03 LAB — BASIC METABOLIC PANEL
Calcium: 9.3 mg/dL (ref 8.4–10.5)
Creatinine, Ser: 0.65 mg/dL (ref 0.50–1.10)
GFR calc Af Amer: >90 mL/min (ref >90)
GFR calc non Af Amer: 89 mL/min — ABNORMAL LOW (ref >90)
Sodium: 139 mEq/L (ref 135–145)

## 2012-08-03 LAB — HEPARIN LEVEL (UNFRACTIONATED): Heparin Unfractionated: 0.33 IU/mL (ref 0.30–0.70)

## 2012-08-03 SURGERY — LEFT HEART CATHETERIZATION WITH CORONARY ANGIOGRAM
Anesthesia: LOCAL

## 2012-08-03 MED ORDER — HEPARIN (PORCINE) IN NACL 2-0.9 UNIT/ML-% IJ SOLN
INTRAMUSCULAR | Status: AC
  Filled 2012-08-03: qty 1000

## 2012-08-03 MED ORDER — LIDOCAINE HCL (PF) 1 % IJ SOLN
INTRAMUSCULAR | Status: AC
  Filled 2012-08-03: qty 30

## 2012-08-03 MED ORDER — ACETAMINOPHEN 325 MG PO TABS: 650.0000 mg | ORAL_TABLET | ORAL | Status: DC | PRN

## 2012-08-03 MED ORDER — ONDANSETRON HCL 4 MG/2ML IJ SOLN: 4.0000 mg | Freq: Four times a day (QID) | INTRAMUSCULAR | Status: DC | PRN

## 2012-08-03 MED ORDER — HEPARIN SODIUM (PORCINE) 1000 UNIT/ML IJ SOLN
INTRAMUSCULAR | Status: AC
  Filled 2012-08-03: qty 1

## 2012-08-03 MED ORDER — VERAPAMIL HCL 2.5 MG/ML IV SOLN
INTRAVENOUS | Status: AC
  Filled 2012-08-03: qty 2

## 2012-08-03 MED ORDER — MIDAZOLAM HCL 2 MG/2ML IJ SOLN
INTRAMUSCULAR | Status: AC
  Filled 2012-08-03: qty 2

## 2012-08-03 MED ORDER — POTASSIUM CHLORIDE CRYS ER 20 MEQ PO TBCR: 40.0000 meq | EXTENDED_RELEASE_TABLET | Freq: Once | ORAL | Status: DC

## 2012-08-03 MED ORDER — SODIUM CHLORIDE 0.9 % IV SOLN: INTRAVENOUS | Status: AC

## 2012-08-03 MED ORDER — NITROGLYCERIN 0.2 MG/ML ON CALL CATH LAB
INTRAVENOUS | Status: AC
  Filled 2012-08-03: qty 1

## 2012-08-03 MED ORDER — FENTANYL CITRATE 0.05 MG/ML IJ SOLN
INTRAMUSCULAR | Status: AC
  Filled 2012-08-03: qty 2

## 2012-08-03 NOTE — Progress Notes (Addendum)
TR BAND REMOVAL   LOCATION:  right radial  DEFLATED PER PROTOCOL:  yes  TIME BAND OFF / DRESSING APPLIED:   1230   SITE UPON ARRIVAL:   Level 0  SITE AFTER BAND REMOVAL:  Level 0  REVERSE ALLEN'S TEST:    positive  CIRCULATION SENSATION AND MOVEMENT:  Within Normal Limits  yes  COMMENTS:  Complained of some numbness below thumb then resolved no further complaints  Ambulated hall tolerated well denies pain.

## 2012-08-03 NOTE — Interval H&P Note (Signed)
History and Physical Interval Note:  08/03/2012 9:22 AM  Jody Ruiz  has presented today for cardiac cath with the diagnosis of CAD/chest pain  The various methods of treatment have been discussed with the patient and family. After consideration of risks, benefits and other options for treatment, the patient has consented to  Procedure(s) (LRB) with comments: LEFT HEART CATHETERIZATION WITH CORONARY ANGIOGRAM (N/A) as a surgical intervention .  The patient's history has been reviewed, patient examined, no change in status, stable for surgery.  I have reviewed the patient's chart and labs.  Questions were answered to the patient's satisfaction.     Grainger Mccarley

## 2012-08-03 NOTE — Discharge Summary (Signed)
Patient ID: Jody Ruiz,  MRN: [id number], DOB/AGE: [birth year] 70 y.o.  Admit date: 08/02/2012 Discharge date: 08/03/2012  Primary Care Provider: ARONSON,RICHARD A Primary Cardiologist: T. Stuckey, MD  Discharge Diagnoses Principal Problem:  *Unstable angina  **s/p Cath this admission revealing stable anatomy and patent LAD stents.  Active Problems:  Coronary atherosclerosis of native coronary artery  Hypertension  Obesity  Dyslipidemia  Allergies Allergies  Allergen Reactions  . Brilinta (Ticagrelor) Shortness Of Breath and Cough    fatigue  . Lisinopril Shortness Of Breath and Cough  . Statins     Muscle Weakness  . Macrodantin (Nitrofurantoin Macrocrystal) Rash  . Sulfa Antibiotics Rash   Procedures  Cardiac Catheterization 08/03/2012  Hemodynamic Findings: Central aortic pressure: 162/70 Left ventricular pressure: 150/8/21  Angiographic Findings:  Left main: No obstructive disease noted.   Left Anterior Descending Artery: Large caliber vessel that courses to the apex. Patent proximal and mid stents with no restenosis. There are several very small caliber diagonal branches with mild plaque disease.   Circumflex Artery: The left circumflex coronary gives rise to 2 marginal branches. The circumflex has mild nonobstructive disease up to 20%. The first marginal branch has a 40% focal stenosis at its takeoff. The second marginal branch has 30% disease in the mid vessel. Right Coronary Artery: The right coronary is a dominant vessel. There is 30% disease in the proximal vessel. The right coronary gives rise to the PDA and a posterior lateral branch distally. The PDA has mild disease up to 30-40%. The posterior lateral branch has a 40% stenosis proximally followed by a 50% stenosis in the mid vessel. This does not appear to be flow limiting.   Left Ventricular Angiogram: LVEF=55-60%.  _____________  History of Present Illness  72 year old female with prior history of  coronary artery disease status post non-ST segment elevation myocardial infarction October 2013 with subsequent stenting of the LAD. Post procedure, she was placed on long-acting nitrates for recurrent angina. She had been doing well but over the past several weeks, she has been experiencing dyspnea exertion accompanied by left-sided chest discomfort radiating to her left shoulder. She was seen in clinic on January 27 and reported her symptoms which she deemed to be similar to prior anginal equivalent. Decision was made to admit her for further evaluation and catheterization.  Hospital Course  Patient ruled out for myocardial infarction. She had no further chest discomfort or dyspnea. She underwent diagnostic catheterization this morning revealing stable coronary anatomy with patent stents in the LAD and nonobstructive distal right coronary artery/PDA/RPL disease. Continue medical therapy has been recommended. Post catheterization, she's been ambulating without recurrent symptoms or limitations. She'll be discharged home this afternoon in good condition.  Discharge Vitals Blood pressure 119/68, pulse 78, temperature 98.5 F (36.9 C), temperature source Oral, resp. rate 20, height 5\' 7"  (1.702 m), weight 219 lb 5.7 oz (99.5 kg), SpO2 97.00%.  Filed Weights   08/02/12 1131 08/03/12 0411  Weight: 222 lb 1.9 oz (100.752 kg) 219 lb 5.7 oz (99.5 kg)   Labs  CBC  Basename 08/03/12 0525 08/02/12 1221  WBC 7.3 7.2  NEUTROABS -- 4.3  HGB 12.8 12.5  HCT 37.7 36.8  MCV 92.4 92.2  PLT 273 284   Basic Metabolic Panel  Basename 08/03/12 0525 08/02/12 1221  NA 139 138  K 3.4* 3.5  CL 102 101  CO2 26 26  GLUCOSE 111* 123*  BUN 23 22  CREATININE 0.65 0.66  CALCIUM 9.3 9.5  MG -- --  PHOS -- --   Liver Function Tests  Basename 08/02/12 1221  AST 23  ALT 25  ALKPHOS 60  BILITOT 0.3  PROT 7.2  ALBUMIN 3.8   Cardiac Enzymes  Basename 08/02/12 2357 08/02/12 1743 08/02/12 1221  CKTOTAL --  -- --  CKMB -- -- --  CKMBINDEX -- -- --  TROPONINI <0.30 <0.30 <0.30   Disposition  Pt is being discharged home today in good condition.  Follow-up Plans & Appointments  Follow-up Information    Follow up with Shawnie Pons, MD. On 09/16/2012. (11:15 AM)    Contact information:   [address] [place] [address] [phone]       Follow up with Tereso Newcomer, PA. On 08/16/2012. (11:30 AM)    Contact information:   [address] [place] [address] [phone]        Discharge Medications    Medication List     As of 08/03/2012  3:04 PM    TAKE these medications         amLODipine 5 MG tablet   Commonly known as: NORVASC   Take 1 tablet (5 mg total) by mouth daily.      aspirin EC 81 MG tablet   Take 81 mg by mouth daily.      clopidogrel 75 MG tablet   Commonly known as: PLAVIX   Take 1 tablet (75 mg total) by mouth daily.      ezetimibe 10 MG tablet   Commonly known as: ZETIA   Take 1 tablet (10 mg total) by mouth daily.      isosorbide mononitrate 30 MG 24 hr tablet   Commonly known as: IMDUR   Take 1 tablet (30 mg total) by mouth daily.      nitroGLYCERIN 0.4 MG SL tablet   Commonly known as: NITROSTAT   Place 1 tablet (0.4 mg total) under the tongue every 5 (five) minutes x 3 doses as needed for chest pain.      OSCAL 500/200 D-3 500-200 MG-UNIT per tablet   Generic drug: calcium-vitamin D   Take 1 tablet by mouth 2 (two) times daily.      polyvinyl alcohol 1.4 % ophthalmic solution   Commonly known as: LIQUIFILM TEARS   Place 1 drop into both eyes as needed. For dry eyes      TOPROL XL 50 MG 24 hr tablet   Generic drug: metoprolol succinate   Take 1 tablet (50 mg total) by mouth daily. Take with or immediately following a meal.      Outstanding Labs/Studies  None  Duration of Discharge Encounter   Greater than 30 minutes including physician time.  Signed, Nicolasa Ducking NP 08/03/2012, 3:04  PM

## 2012-08-03 NOTE — Progress Notes (Addendum)
Pt leaving pants, underwear, and reading glasses in room while off to cath lab.  Wearing wedding band and partial upper.  No family present.  Husband en route.

## 2012-08-03 NOTE — Discharge Summary (Signed)
See full cath note. cdm 

## 2012-08-03 NOTE — CV Procedure (Signed)
    Cardiac Catheterization Operative Report  DESHANA ROMINGER [id number] 1/28/20149:55 AM ARONSON,RICHARD A, MD  Procedure Performed:  1. Left Heart Catheterization 2. Selective Coronary Angiography 3. Left ventricular angiogram  Operator: Verne Carrow, MD  Arterial access site:  Right radial artery.   Indication:  72 yo female with h/o CAD with recent NSTEMI October 2013 with two DES placed LAD (proximal and mid) at that time. Recent dyspnea, fatigue and chest pain. Cardiac markers negative.                                      Procedure Details: The risks, benefits, complications, treatment options, and expected outcomes were discussed with the patient. The patient and/or family concurred with the proposed plan, giving informed consent. The patient was brought to the cath lab after IV hydration was begun and oral premedication was given. The patient was further sedated with Versed and Fentanyl. The right wrist was assessed with an Allens test which was positive. The right wrist was prepped and draped in a sterile fashion. 1% lidocaine was used for local anesthesia. Using the modified Seldinger access technique, a 5 French sheath was placed in the right radial artery. 3 mg Verapamil was given through the sheath. 5000 units IV heparin was given. Standard diagnostic catheters were used to perform selective coronary angiography. A pigtail catheter was used to perform a left ventricular angiogram. The sheath was removed from the right radial artery and a Terumo hemostasis band was applied at the arteriotomy site on the right wrist.    There were no immediate complications. The patient was taken to the recovery area in stable condition.   Hemodynamic Findings: Central aortic pressure: 162/70 Left ventricular pressure: 150/8/21  Angiographic Findings:  Left main: No obstructive disease noted.    Left Anterior Descending Artery: Large caliber vessel that courses to the apex.  Patent proximal and mid stents with no restenosis. There are several very small caliber diagonal branches with mild plaque disease.   Circumflex Artery: The left circumflex coronary gives rise to 2 marginal branches. The circumflex has mild nonobstructive disease up to 20%. The first marginal branch has a 40% focal stenosis at its takeoff. The second marginal branch has 30% disease in the mid vessel.  Right Coronary Artery: The right coronary is a dominant vessel. There is 30% disease in the proximal vessel. The right coronary gives rise to the PDA and a posterior lateral branch distally. The PDA has mild disease up to 30-40%. The posterior lateral branch has a 40% stenosis proximally followed by a 50% stenosis in the mid vessel. This does not appear to be flow limiting.   Left Ventricular Angiogram: LVEF=55-60%.   Impression: 1. Double vessel CAD with patent stents LAD 2. Moderate stenosis in small caliber posterolateral branch that does not appear to be flow limiting. This is actually improved since last cath.  3. Normal LV systolic function  Recommendations: Continue medical management.        Complications:  None. The patient tolerated the procedure well.

## 2012-08-03 NOTE — Progress Notes (Signed)
ANTICOAGULATION CONSULT NOTE - Initial Consult  Pharmacy Consult for Heparin  Indication: chest pain/ACS  Allergies  Allergen Reactions  . Brilinta (Ticagrelor) Shortness Of Breath and Cough    fatigue  . Lisinopril Shortness Of Breath and Cough  . Statins     Muscle Weakness  . Macrodantin (Nitrofurantoin Macrocrystal) Rash  . Sulfa Antibiotics Rash    Patient Measurements: Height: 5\' 7"  (170.2 cm) Weight: 219 lb 5.7 oz (99.5 kg) IBW/kg (Calculated) : 61.6  Heparin Dosing Weight: 84 kg  Vital Signs: Temp: 98.3 F (36.8 C) (01/28 0841) Temp src: Oral (01/28 0841) BP: 137/63 mmHg (01/28 0841) Pulse Rate: 63  (01/28 0841)  Labs:  Basename 08/03/12 0525 08/02/12 2357 08/02/12 2240 08/02/12 1743 08/02/12 1221  HGB 12.8 -- -- -- 12.5  HCT 37.7 -- -- -- 36.8  PLT 273 -- -- -- 284  APTT -- -- -- -- 32  LABPROT -- -- -- -- 12.4  INR -- -- -- -- 0.93  HEPARINUNFRC 0.33 -- 0.31 -- --  CREATININE 0.65 -- -- -- 0.66  CKTOTAL -- -- -- -- --  CKMB -- -- -- -- --  TROPONINI -- <0.30 -- <0.30 <0.30    Estimated Creatinine Clearance: 80.5 ml/min (by C-G formula based on Cr of 0.65).   Medical History: Past Medical History  Diagnosis Date  . Anemia   . Hx of adenomatous colonic polyps   . CAD (coronary artery disease)     a. NSTEMI 10/13 => LHC 04/28/12:  pLAD 90%, mLAD 70-80% (long), pD1 70-80%, prox Int Br 30% (small), CFX 20%, OM1 40%, mOM2 30%, pRCA 30%, PDA 30-40%, pPLB 70%, then mid 90%, EF 65%.  PCI:  Promus DES x 2 to prox and mid LAD (3x46mm and 2.5x14mm).    Med Rx recommended for PLB (small vessel).  Consider PCI if symptomatic.    Marland Kitchen Hypertension   . Microscopic colitis   . Arthritis   . Obesity   . HLD (hyperlipidemia)     intolerant to statins  . Dyspnea     a. CP and SOB 06/2012 => Ticagrelor changed to Plavix  . Myocardial infarction 04/2012     non stemi  . Anginal pain     Assessment: 71 YOF on IV heparin for unstable angina. Plan for cardiac cath  this morning. AM Heparin level therapeutic, CBC stable.  Goal of Therapy:  Heparin level 0.3-0.7 units/ml Monitor platelets by anticoagulation protocol: Yes   Plan:  No change for heparin for now, Will f/u plans after Cath.  Bayard Hugger, PharmD, BCPS  Clinical Pharmacist  Pager: [phone]  08/03/2012,8:55 AM

## 2012-08-04 ENCOUNTER — Encounter (HOSPITAL_COMMUNITY): Payer: Medicare Other

## 2012-08-06 ENCOUNTER — Encounter (HOSPITAL_COMMUNITY): Payer: Medicare Other

## 2012-08-09 ENCOUNTER — Encounter (HOSPITAL_COMMUNITY): Payer: Medicare Other

## 2012-08-11 ENCOUNTER — Encounter (HOSPITAL_COMMUNITY)
Admission: RE | Admit: 2012-08-11 | Discharge: 2012-08-11 | Disposition: A | Discharge status: Home / Self Care | Payer: Medicare Other | Source: Ambulatory Visit | Attending: Cardiology | Admitting: Cardiology

## 2012-08-11 DIAGNOSIS — Z7982 Long term (current) use of aspirin: Secondary | ICD-10-CM | POA: Diagnosis not present

## 2012-08-11 DIAGNOSIS — Z79899 Other long term (current) drug therapy: Secondary | ICD-10-CM | POA: Diagnosis not present

## 2012-08-11 DIAGNOSIS — Z9861 Coronary angioplasty status: Secondary | ICD-10-CM | POA: Diagnosis not present

## 2012-08-11 DIAGNOSIS — I214 Non-ST elevation (NSTEMI) myocardial infarction: Secondary | ICD-10-CM | POA: Insufficient documentation

## 2012-08-11 DIAGNOSIS — E785 Hyperlipidemia, unspecified: Secondary | ICD-10-CM | POA: Diagnosis not present

## 2012-08-11 DIAGNOSIS — E669 Obesity, unspecified: Secondary | ICD-10-CM | POA: Diagnosis not present

## 2012-08-11 DIAGNOSIS — I2510 Atherosclerotic heart disease of native coronary artery without angina pectoris: Secondary | ICD-10-CM | POA: Insufficient documentation

## 2012-08-11 DIAGNOSIS — Z8249 Family history of ischemic heart disease and other diseases of the circulatory system: Secondary | ICD-10-CM | POA: Insufficient documentation

## 2012-08-11 DIAGNOSIS — I10 Essential (primary) hypertension: Secondary | ICD-10-CM | POA: Insufficient documentation

## 2012-08-11 DIAGNOSIS — Z5189 Encounter for other specified aftercare: Secondary | ICD-10-CM | POA: Insufficient documentation

## 2012-08-12 DIAGNOSIS — H35039 Hypertensive retinopathy, unspecified eye: Secondary | ICD-10-CM | POA: Diagnosis not present

## 2012-08-12 DIAGNOSIS — H1045 Other chronic allergic conjunctivitis: Secondary | ICD-10-CM | POA: Diagnosis not present

## 2012-08-12 DIAGNOSIS — H2510 Age-related nuclear cataract, unspecified eye: Secondary | ICD-10-CM | POA: Diagnosis not present

## 2012-08-13 ENCOUNTER — Telehealth (HOSPITAL_COMMUNITY): Payer: Self-pay | Admitting: Cardiac Rehabilitation

## 2012-08-13 ENCOUNTER — Encounter (HOSPITAL_COMMUNITY): Payer: Medicare Other

## 2012-08-13 NOTE — Telephone Encounter (Signed)
pc received from pt they will be unable to attend cardiac rehab today due to doctors appointment. Plan to return Monday 08/16/12

## 2012-08-16 ENCOUNTER — Telehealth: Payer: Self-pay | Admitting: *Deleted

## 2012-08-16 ENCOUNTER — Encounter: Payer: Self-pay | Admitting: Physician Assistant

## 2012-08-16 ENCOUNTER — Ambulatory Visit (INDEPENDENT_AMBULATORY_CARE_PROVIDER_SITE_OTHER): Payer: Medicare Other | Admitting: Physician Assistant

## 2012-08-16 ENCOUNTER — Encounter (HOSPITAL_COMMUNITY)
Admission: RE | Admit: 2012-08-16 | Discharge: 2012-08-16 | Disposition: A | Discharge status: Home / Self Care | Payer: Medicare Other | Source: Ambulatory Visit | Attending: Cardiology | Admitting: Cardiology

## 2012-08-16 VITALS — BP 134/76 | HR 68 | Ht 67.0 in | Wt 222.0 lb

## 2012-08-16 DIAGNOSIS — R0602 Shortness of breath: Secondary | ICD-10-CM

## 2012-08-16 DIAGNOSIS — I2510 Atherosclerotic heart disease of native coronary artery without angina pectoris: Secondary | ICD-10-CM

## 2012-08-16 DIAGNOSIS — I10 Essential (primary) hypertension: Secondary | ICD-10-CM | POA: Diagnosis not present

## 2012-08-16 LAB — BASIC METABOLIC PANEL
CO2: 28 mEq/L (ref 19–32)
Calcium: 9.5 mg/dL (ref 8.4–10.5)
Creatinine, Ser: 0.8 mg/dL (ref 0.4–1.2)
GFR: 78.83 mL/min (ref >60.00)

## 2012-08-16 MED ORDER — POTASSIUM CHLORIDE ER 10 MEQ PO TBCR: 10.0000 meq | EXTENDED_RELEASE_TABLET | Freq: Every day | ORAL | Status: DC

## 2012-08-16 MED ORDER — FUROSEMIDE 20 MG PO TABS: 20.0000 mg | ORAL_TABLET | Freq: Every day | ORAL | Status: DC

## 2012-08-16 NOTE — Telephone Encounter (Signed)
x 2 busy, will try again tomorow

## 2012-08-16 NOTE — Patient Instructions (Addendum)
START LASIX 20 MG DAILY START POTASSIUM 10 MEQ DAILY  LAB TODAY BMET, BNP  LAB 1 WEEK 08/23/12 REPEAT BMET  Your physician has requested that you have an echocardiogram. Echocardiography is a painless test that uses sound waves to create images of your heart. It provides your doctor with information about the size and shape of your heart and how well your heart's chambers and valves are working. This procedure takes approximately one hour. There are no restrictions for this procedure.  KEEP APPT WITH DR. Riley Kill 09/2012

## 2012-08-16 NOTE — Telephone Encounter (Signed)
Message copied by Tarri Fuller on Mon Aug 16, 2012  5:35 PM ------      Message from: [place], [place] T      Created: Mon Aug 16, 2012  5:11 PM       Labs ok      Continue with current treatment plan.      See if Lasix makes her feel better after taking for 3-4 days.  If not, she can stop (BNP normal)      Get follow up labs as planned.      Tereso Newcomer, PA-C  5:10 PM 08/16/2012 ------

## 2012-08-16 NOTE — Progress Notes (Signed)
[address] [place], [place]  [address] Phone: [phone], Fax:  [phone]  Date:  08/16/2012   ID:  Jody Ruiz, Jody Ruiz 01-23-1941, MRN [id number]  PCP:  Minda Meo, MD  Primary Cardiologist:  Dr.  Shawnie Pons     History of Present Illness: Jody Ruiz is a 72 y.o. female who returns for f/u after recent admission to the hospital with chest pain.  She has a hx of CAD, status post non-STEMI 10/13, HTN, HL, obesity.  LHC 04/28/12: pLAD 90%, mLAD 70-80% (long), pD1 70-80%, prox Int Br 30% (small), CFX 20%, OM1 40%, mOM2 30%, pRCA 30%, PDA 30-40%, pPLB 70%, then mid 90%, EF 65%. PCI: Promus DES x 2 to prox and mid LAD (3x64mm and 2.5x6mm). Med Rx recommended for PLB (small vessel). Consider PCI if symptomatic.  Post discharge, she was placed on isosorbide for recurrent angina. In December when she saw Dr. Riley Kill, she complained of dyspnea and some chest discomfort. He changed her Ticagrelor to clopidogrel for suspected side effects. Last seen by Dr. Riley Kill 07/16/12.  I saw her 1/27. She complained of chest discomfort that was concerning for unstable angina. She was admitted for cardiac catheterization. LHC 08/03/12: Proximal and mid LAD stents patent, very small caliber Dx branch with mild plaque, CFX 20%, OM1 40%, OM2 30 % mid, pRCA 30%, PDA 30-40%, pPLB 40%, mid 50%, EF 55-60%.  Medical therapy recommended. Of note, there was some concern for volume overload when she was admitted and she received 1 dose of IV Lasix. She is unsure whether or not she felt better with this. She denies any further chest pain. She does have dyspnea with exertion. She probably describes NYHA class II-IIb symptoms. She denies orthopnea but does describe questionable PND. She denies syncope or pedal edema.  Labs (10/13): K 3.4=>3.9, creatinine 0.63=>0.8, ALT 26, LDL 96, Hgb 11.7 Labs (12/13): K 3.9, creatinine 0.7, Hgb 13.3 Labs (1/14):   K 4.6, Creatinine 0.71 => 0.65,  ALT 24, LDL 102, Hgb 12.8, TSH 4.260   Wt Readings from Last 3 Encounters:  08/16/12 222 lb (100.699 kg)  08/03/12 219 lb 5.7 oz (99.5 kg)  08/03/12 219 lb 5.7 oz (99.5 kg)     Past Medical History  Diagnosis Date  . Anemia   . Hx of adenomatous colonic polyps   . CAD (coronary artery disease)     a. NSTEMI 10/13 => LHC 04/28/12:  pLAD 90%, mLAD 70-80% (long), pD1 70-80%, prox Int Br 30% (small), CFX 20%, OM1 40%, mOM2 30%, pRCA 30%, PDA 30-40%, pPLB 70%, then mid 90%, EF 65%.  PCI:  Promus DES x 2 to prox and mid LAD (3x39mm and 2.5x55mm);  b. 07/2012 Cath: LM nl, LAD patent stents, LCX 20, OM1 40p, OM2 50m, RCA 30pPDA 30-40, RPL 40p, 21m, EF 55-60% ->Cont Med Rx.  Marland Kitchen Hypertension   . Microscopic colitis   . Arthritis   . Obesity   . HLD (hyperlipidemia)     intolerant to statins  . Dyspnea     a. CP and SOB 06/2012 => Ticagrelor changed to Plavix  . Myocardial infarction 04/2012     non stemi    Current Outpatient Prescriptions  Medication Sig Dispense Refill  . amLODipine (NORVASC) 5 MG tablet Take 1 tablet (5 mg total) by mouth daily.  30 tablet  3  . aspirin EC 81 MG tablet Take 81 mg by mouth daily.      . calcium-vitamin D (  OSCAL 500/200 D-3) 500-200 MG-UNIT per tablet Take 1 tablet by mouth 2 (two) times daily.      . clopidogrel (PLAVIX) 75 MG tablet Take 1 tablet (75 mg total) by mouth daily.  30 tablet  3  . ezetimibe (ZETIA) 10 MG tablet Take 1 tablet (10 mg total) by mouth daily.  30 tablet  3  . isosorbide mononitrate (IMDUR) 30 MG 24 hr tablet Take 1 tablet (30 mg total) by mouth daily.  30 tablet  11  . metoprolol succinate (TOPROL XL) 50 MG 24 hr tablet Take 1 tablet (50 mg total) by mouth daily. Take with or immediately following a meal.      . nitroGLYCERIN (NITROSTAT) 0.4 MG SL tablet Place 1 tablet (0.4 mg total) under the tongue every 5 (five) minutes x 3 doses as needed for chest pain.  25 tablet  3  . polyvinyl alcohol (LIQUIFILM TEARS) 1.4 % ophthalmic  solution Place 1 drop into both eyes as needed. For dry eyes       No current facility-administered medications for this visit.    Allergies:    Allergies  Allergen Reactions  . Brilinta (Ticagrelor) Shortness Of Breath and Cough    fatigue  . Lisinopril Shortness Of Breath and Cough  . Statins     Muscle Weakness  . Macrodantin (Nitrofurantoin Macrocrystal) Rash  . Sulfa Antibiotics Rash    Social History:  The patient  reports that she has never smoked. She has never used smokeless tobacco. She reports that she does not drink alcohol or use illicit drugs.   ROS:  Please see the history of present illness.   She reports a chronic cough. Her husband has also noted her to have apneic episodes at night. She denies daytime hypersomnolence. There is a history of snoring. She is due to see Dr. Sherene Sires soon for further evaluation. She also has noted a recent positive Hemoccult test as a part of her annual physical. Referral to gastroenterology is pending.  All other systems reviewed and negative.   PHYSICAL EXAM: VS:  BP 134/76  Pulse 68  Ht 5\' 7"  (1.702 m)  Wt 222 lb (100.699 kg)  BMI 34.76 kg/m2 Well nourished, well developed, in no acute distress HEENT: normal Neck: no JVD Cardiac:  normal S1, S2; RRR; no murmur Lungs:  Bibasilar crackles, no wheezing Abd: soft, nontender, no hepatomegaly Ext: no edema Skin: warm and dry Neuro:  CNs 2-12 intact, no focal abnormalities noted  EKG:  NSR, HR 68, normal axis, lateral T wave inversions, no change prior tracing  ASSESSMENT AND PLAN:  1. Coronary Artery Disease:  Stable. No further chest pain. Continue current therapy. 2. Dyspnea:  Question if she has some diastolic dysfunction. I will arrange a formal echocardiogram. Start Lasix 20 mg daily and potassium 10 mEq daily. Check a basic metabolic panel and BNP today. Check a follow up basic metabolic panel in one week. She also has an appointment soon with pulmonology. 3. Snoring:  She will  see pulmonology soon. I suspect she will need a sleep study. 4. Heme Positive Stool:  She will need to remain on Plavix and ASA with recent hx of DES x 2 to the prox and mid LAD in 04/2012.  Timing of when this could be held will be up to Dr. Riley Kill. 5. Hypertension:  Controlled. Continue Toprol and amlodipine. 6. Hyperlipidemia:  She is intolerant to statins. Continue Zetia. 7. Disposition:  Follow up with Dr.  Shawnie Pons next  month as planned.   Signed, Tereso Newcomer, PA-C  12:14 PM 08/16/2012

## 2012-08-18 ENCOUNTER — Ambulatory Visit (HOSPITAL_COMMUNITY): Payer: Medicare Other | Attending: Cardiovascular Disease

## 2012-08-18 ENCOUNTER — Encounter: Payer: Self-pay | Admitting: Physician Assistant

## 2012-08-18 ENCOUNTER — Encounter (HOSPITAL_COMMUNITY)
Admission: RE | Admit: 2012-08-18 | Discharge: 2012-08-18 | Disposition: A | Discharge status: Home / Self Care | Payer: Medicare Other | Source: Ambulatory Visit | Attending: Cardiology | Admitting: Cardiology

## 2012-08-18 DIAGNOSIS — I379 Nonrheumatic pulmonary valve disorder, unspecified: Secondary | ICD-10-CM | POA: Diagnosis not present

## 2012-08-18 DIAGNOSIS — R0609 Other forms of dyspnea: Secondary | ICD-10-CM | POA: Insufficient documentation

## 2012-08-18 DIAGNOSIS — R0989 Other specified symptoms and signs involving the circulatory and respiratory systems: Secondary | ICD-10-CM | POA: Diagnosis not present

## 2012-08-18 DIAGNOSIS — R0602 Shortness of breath: Secondary | ICD-10-CM

## 2012-08-18 DIAGNOSIS — I10 Essential (primary) hypertension: Secondary | ICD-10-CM | POA: Insufficient documentation

## 2012-08-18 DIAGNOSIS — I080 Rheumatic disorders of both mitral and aortic valves: Secondary | ICD-10-CM | POA: Diagnosis not present

## 2012-08-18 DIAGNOSIS — R0789 Other chest pain: Secondary | ICD-10-CM | POA: Diagnosis not present

## 2012-08-18 DIAGNOSIS — I2510 Atherosclerotic heart disease of native coronary artery without angina pectoris: Secondary | ICD-10-CM | POA: Insufficient documentation

## 2012-08-18 DIAGNOSIS — E785 Hyperlipidemia, unspecified: Secondary | ICD-10-CM | POA: Diagnosis not present

## 2012-08-18 DIAGNOSIS — E669 Obesity, unspecified: Secondary | ICD-10-CM | POA: Diagnosis not present

## 2012-08-18 DIAGNOSIS — I252 Old myocardial infarction: Secondary | ICD-10-CM | POA: Insufficient documentation

## 2012-08-18 NOTE — Progress Notes (Signed)
Echocardiogram performed.  

## 2012-08-19 ENCOUNTER — Institutional Professional Consult (permissible substitution): Payer: Medicare Other | Admitting: Internal Medicine

## 2012-08-19 ENCOUNTER — Telehealth: Payer: Self-pay | Admitting: Cardiology

## 2012-08-19 NOTE — Telephone Encounter (Signed)
I called patient regarding status.  With blood in her stool  (CBC was stable), I suggested that our time frame for DAPT could be simply six months  (see multiple prior trials).  Also with SOB, would favor follow up with PFTs, possible CT, and or VQ.  Symptoms precede her first stent procedure.  Has CXR with elevated diaphragm.  She was supposed to see Dr. Sherene Sires, but this was cancelled due to snow.  She is awaiting reschedule.  TS

## 2012-08-20 ENCOUNTER — Encounter (HOSPITAL_COMMUNITY): Payer: Medicare Other

## 2012-08-21 ENCOUNTER — Other Ambulatory Visit: Payer: Self-pay

## 2012-08-23 ENCOUNTER — Telehealth: Payer: Self-pay | Admitting: *Deleted

## 2012-08-23 ENCOUNTER — Other Ambulatory Visit: Payer: Medicare Other

## 2012-08-23 ENCOUNTER — Encounter (HOSPITAL_COMMUNITY)
Admission: RE | Admit: 2012-08-23 | Discharge: 2012-08-23 | Disposition: A | Discharge status: Home / Self Care | Payer: Medicare Other | Source: Ambulatory Visit | Attending: Cardiology | Admitting: Cardiology

## 2012-08-23 NOTE — Telephone Encounter (Signed)
Message copied by Tarri Fuller on Mon Aug 23, 2012  8:26 AM ------      Message from: [place], [place] T      Created: Mon Aug 16, 2012  5:11 PM       Labs ok      Continue with current treatment plan.      See if Lasix makes her feel better after taking for 3-4 days.  If not, she can stop (BNP normal)      Get follow up labs as planned.      Tereso Newcomer, PA-C  5:10 PM 08/16/2012 ------

## 2012-08-23 NOTE — Telephone Encounter (Signed)
lmptcb for test results 

## 2012-08-23 NOTE — Telephone Encounter (Signed)
pt states she did not start the lasix until 08/19/12, said did not make her feel any better, advised ok to stop and take PRN per Bing Neighbors. PA, rsc bmet to 08/26/12

## 2012-08-23 NOTE — Telephone Encounter (Signed)
Message copied by Tarri Fuller on Mon Aug 23, 2012 12:24 PM ------      Message from: [place], [place] T      Created: Wed Aug 18, 2012  1:33 PM       Echo ok with      Normal LV function.      She does have mild diastolic dysfunction      Tereso Newcomer, PA-C 08/18/2012 1:32 PM ------

## 2012-08-24 ENCOUNTER — Telehealth: Payer: Self-pay | Admitting: *Deleted

## 2012-08-24 NOTE — Telephone Encounter (Signed)
pt notified about echo results with verbal understanding today 

## 2012-08-24 NOTE — Telephone Encounter (Signed)
Message copied by Tarri Fuller on Tue Aug 24, 2012 11:37 AM ------      Message from: [place], [place] T      Created: Wed Aug 18, 2012  1:33 PM       Echo ok with      Normal LV function.      She does have mild diastolic dysfunction      Tereso Newcomer, PA-C 08/18/2012 1:32 PM ------

## 2012-08-25 ENCOUNTER — Encounter (HOSPITAL_COMMUNITY)
Admission: RE | Admit: 2012-08-25 | Discharge: 2012-08-25 | Disposition: A | Discharge status: Home / Self Care | Payer: Medicare Other | Source: Ambulatory Visit | Attending: Cardiology | Admitting: Cardiology

## 2012-08-25 NOTE — Progress Notes (Signed)
Jody Ruiz 72 y.o. female Nutrition Note Spoke with pt.  Nutrition Plan and Nutrition Survey goals reviewed with pt. Pt is following Step 2 of the Therapeutic Lifestyle Changes diet. Pt wants to lose wt. Wt loss tips briefly reviewed. Pt expressed understanding of the information reviewed. Pt has attended nutrition classes offered.  Nutrition Diagnosis   Food-and nutrition-related knowledge deficit related to lack of exposure to information as related to diagnosis of: ? CVD    Obesity related to excessive energy intake as evidenced by a BMI of 35.0  Nutrition RX/ Estimated Daily Nutrition Needs for: wt loss  1350-1850 Kcal, 35-50 gm fat, 9-14 gm sat fat, 1.3-1.9 gm trans-fat, <1500 mg sodium   Nutrition Intervention   Pt's individual nutrition plan including cholesterol goals reviewed with pt.   Benefits of adopting Therapeutic Lifestyle Changes discussed when Medficts reviewed.   Pt to attend the Portion Distortion class   Pt given handouts for: ? 5-day, 1500 kcal menu ideas   Continue client-centered nutrition education by RD, as part of interdisciplinary care.  Goal(s)   Pt to identify food quantities necessary to achieve: ? wt loss to a goal wt of 198-210 lb (90.4-95.8 kg) at graduation from cardiac rehab.   Monitor and Evaluate progress toward nutrition goal with team. Nutrition Risk:  Low

## 2012-08-26 ENCOUNTER — Encounter: Payer: Self-pay | Admitting: Internal Medicine

## 2012-08-26 ENCOUNTER — Ambulatory Visit (INDEPENDENT_AMBULATORY_CARE_PROVIDER_SITE_OTHER): Payer: Medicare Other | Admitting: Internal Medicine

## 2012-08-26 ENCOUNTER — Other Ambulatory Visit: Payer: Medicare Other

## 2012-08-26 ENCOUNTER — Other Ambulatory Visit (INDEPENDENT_AMBULATORY_CARE_PROVIDER_SITE_OTHER): Payer: Medicare Other

## 2012-08-26 VITALS — BP 108/68 | HR 71 | Temp 98.0°F | Ht 67.0 in | Wt 225.0 lb

## 2012-08-26 DIAGNOSIS — I10 Essential (primary) hypertension: Secondary | ICD-10-CM | POA: Diagnosis not present

## 2012-08-26 LAB — BASIC METABOLIC PANEL
BUN: 18 mg/dL (ref 6–23)
Calcium: 9.2 mg/dL (ref 8.4–10.5)
GFR: 77.66 mL/min (ref >60.00)
Glucose, Bld: 83 mg/dL (ref 70–99)
Sodium: 139 mEq/L (ref 135–145)

## 2012-08-26 NOTE — Progress Notes (Signed)
  Subjective:    Patient ID: Jody Ruiz, female    DOB: 1940-08-19  MRN: [id number]  HPI   27 yowf never smoker never asthma new onset sob Jan 2014 referred 08/26/2012 to pulmonary clinic by Dr Riley Kill for abn cxr.   08/26/2012 Pulmonary eval/ Edell Mesenbrink cc indolent onset fall 2013 progressive to point of doe x 50 ft consistently, reproducible and  No noct symptoms but husband notes respt pauses x 6 weeks and pt wakes up not feeling rested.  Better p stent then worse late January assoc with dry cough  No obvious daytime variabilty or assoc chronic cough or cp or chest tightness, subjective wheeze overt sinus or hb symptoms. No unusual exp hx or h/o childhood pna/ asthma or premature birth to her knowledge.     Also denies any obvious fluctuation of symptoms with weather or environmental changes or other aggravating or alleviating factors except as outlined above   Review of Systems  Constitutional: Negative for fever and unexpected weight change.  HENT: Positive for sore throat. Negative for ear pain, nosebleeds, congestion, rhinorrhea, sneezing, trouble swallowing, dental problem, postnasal drip and sinus pressure.   Eyes: Negative for redness and itching.  Respiratory: Positive for cough and shortness of breath. Negative for chest tightness and wheezing.   Cardiovascular: Positive for chest pain. Negative for palpitations and leg swelling.  Gastrointestinal: Negative for nausea and vomiting.  Genitourinary: Negative for dysuria.  Musculoskeletal: Negative for joint swelling.  Skin: Negative for rash.  Neurological: Negative for headaches.  Hematological: Does not bruise/bleed easily.  Psychiatric/Behavioral: Negative for dysphoric mood. The patient is not nervous/anxious.        Objective:   Physical Exam  amb wf nad  Wt Readings from Last 3 Encounters:  08/26/12 225 lb (102.059 kg)  08/16/12 222 lb (100.699 kg)  08/03/12 219 lb 5.7 oz (99.5 kg)    HEENT: nl dentition,  turbinates, and orophanx. Nl external ear canals without cough reflex   NECK :  without JVD/Nodes/TM/ nl carotid upstrokes bilaterally   LUNGS: no acc muscle use, clear to A and P bilaterally without cough on insp or exp maneuvers   CV:  RRR  no s3 or murmur or increase in P2, no edema   ABD:  soft and nontender with nl excursion in the supine position. No bruits or organomegaly, bowel sounds nl  MS:  warm without deformities, calf tenderness, cyanosis or clubbing  SKIN: warm and dry without lesions    NEURO:  alert, approp, no deficits    08/02/12 1. No radiographic evidence of acute cardiopulmonary disease.       Assessment & Plan:

## 2012-08-26 NOTE — Patient Instructions (Addendum)
Try prilosec 20mg   Take 30-60 min before first meal of the day and Pepcid 20 mg one bedtime until cough is completely gone for at least a week without the need for cough suppression  Please see patient coordinator before you leave today  to schedule ct chest and fluoroscopy of diaphragm  Please schedule a follow up office visit in 6 weeks, call sooner if needed with pfts

## 2012-08-27 ENCOUNTER — Ambulatory Visit (INDEPENDENT_AMBULATORY_CARE_PROVIDER_SITE_OTHER): Payer: Medicare Other | Admitting: Internal Medicine

## 2012-08-27 ENCOUNTER — Encounter (HOSPITAL_COMMUNITY)
Admission: RE | Admit: 2012-08-27 | Discharge: 2012-08-27 | Disposition: A | Discharge status: Home / Self Care | Payer: Medicare Other | Source: Ambulatory Visit | Attending: Cardiology | Admitting: Cardiology

## 2012-08-27 ENCOUNTER — Encounter: Payer: Self-pay | Admitting: Internal Medicine

## 2012-08-27 ENCOUNTER — Ambulatory Visit (INDEPENDENT_AMBULATORY_CARE_PROVIDER_SITE_OTHER)
Admission: RE | Admit: 2012-08-27 | Discharge: 2012-08-27 | Disposition: A | Discharge status: Home / Self Care | Payer: Medicare Other | Source: Ambulatory Visit | Attending: Internal Medicine | Admitting: Internal Medicine

## 2012-08-27 VITALS — BP 114/66 | HR 60 | Ht 67.0 in | Wt 222.0 lb

## 2012-08-27 DIAGNOSIS — I2510 Atherosclerotic heart disease of native coronary artery without angina pectoris: Secondary | ICD-10-CM

## 2012-08-27 MED ORDER — IOHEXOL 350 MG/ML SOLN
80.0000 mL | Freq: Once | INTRAVENOUS | Status: AC | PRN
  Administered 2012-08-27: 80 mL via INTRAVENOUS

## 2012-08-27 NOTE — Progress Notes (Signed)
HISTORY OF PRESENT ILLNESS:  Jody Ruiz is a 72 y.o. female with hypertension, coronary artery disease, morbid obesity, osteoarthritis, adenomatous colon polyps, and microscopic colitis (previously under the care of Dr. Dorena Cookey for greater than 1 decade). I saw the patient initially 11/13/2011 (please see that detailed dictation) to establish GI care. She presents today regarding Hemoccult-positive stool on routine comprehensive examination. She is having no active GI complaints. No bleeding. She has had significant change in her health status since last saw her. She had a myocardial infarction in October underwent coronary artery stenting for which she is now on aspirin and Plavix. Recurrent problems with chest pain and dyspnea January for which she underwent repeat cardiac catheterization. She was also seen in pulmonary for problems with shortness of breath and questionable elevated hemidiaphragm. Was seen yesterday. She was placed on PPI and Pepcid for cough. CT scan of the chest has been scheduled. She is in cardiac rehabilitation. Scope a today by her husband. Her last colonoscopy in 2011 was negative. Review of recent laboratories finds normal hemoglobin of 12.8, 08/03/2012.  REVIEW OF SYSTEMS:  All non-GI ROS negative except for marked fatigue, shortness of breath, malaise  Past Medical History  Diagnosis Date  . Anemia   . Hx of adenomatous colonic polyps   . CAD (coronary artery disease)     a. NSTEMI 10/13 => LHC 04/28/12:  pLAD 90%, mLAD 70-80% (long), pD1 70-80%, prox Int Br 30% (small), CFX 20%, OM1 40%, mOM2 30%, pRCA 30%, PDA 30-40%, pPLB 70%, then mid 90%, EF 65%.  PCI:  Promus DES x 2 to prox and mid LAD (3x31mm and 2.5x53mm);  b. 07/2012 Cath: LM nl, LAD patent stents, LCX 20, OM1 40p, OM2 66m, RCA 30pPDA 30-40, RPL 40p, 56m, EF 55-60% ->Cont Med Rx.  Marland Kitchen Hypertension   . Microscopic colitis   . Arthritis   . Obesity   . HLD (hyperlipidemia)     intolerant to statins  .  Dyspnea     a. CP and SOB 06/2012 => Ticagrelor changed to Plavix  . Myocardial infarction 04/2012     non stemi  . Hx of echocardiogram     a. Echo 2/14:  mild LVH, EF 55-65%, Gr 1 diast dysfn, mild LAE    Past Surgical History  Procedure Laterality Date  . Appendectomy    . Cholecystectomy    . Ventral hernia repair    . Breast biopsy      bilateral  . Knee arthroscopy      bilateral  . Vaginal polypectomy    . Shoulder arthroscopy  2012    left  . Cardiac catheterization      x4  . Coronary stent placement  04/2012    Social History RAILEY GLAD  reports that she has never smoked. She has never used smokeless tobacco. She reports that she does not drink alcohol or use illicit drugs.  family history includes Breast cancer in her sister; Colon cancer in her sister; Colon polyps in her father; Heart disease in her brother, father, mother, and sister; Kidney disease in her father; and Prostate cancer in her father.  Allergies  Allergen Reactions  . Brilinta (Ticagrelor) Shortness Of Breath and Cough    fatigue  . Lisinopril Shortness Of Breath and Cough  . Statins     Muscle Weakness  . Macrodantin (Nitrofurantoin Macrocrystal) Rash  . Sulfa Antibiotics Rash       PHYSICAL EXAMINATION:  Vital signs: BP 114/66  Pulse 60  Ht 5\' 7"  (1.702 m)  Wt 222 lb (100.699 kg)  BMI 34.76 kg/m2 General: Well-developed, these, well-nourished, no acute distress HEENT: Sclerae are anicteric, conjunctiva pink. Oral mucosa intact Lungs: Clear Heart: Regular Abdomen: soft, obese, nontender, nondistended, no obvious ascites, no peritoneal signs, normal bowel sounds. No organomegaly. Extremities: No edema Psychiatric: alert and oriented x3. Cooperative    ASSESSMENT:  #1. A symptomatically Hemoccult-positive stool. Normal hemoglobin. Negative colonoscopy 2011 #2. History of colon polyps. Surveillance up-to-date #3. Family history of colon cancer. Surveillance  up-to-date #4. History of lymphocytic colitis. Asymptomatic currently #5. Multiple significant medical problems. Active. Coronary artery stenting in the past 4 months.   PLAN:  #1. Etiology of her Hemoccult-positive stool is uncertain. Not clear why the stool was checked for occult blood. May be appropriate to evaluate her GI tract via colonoscopy and upper endoscopy, she is not fit at this time. I feel that the risks of the procedure at this time outweigh the potential benefit. I would like her to continue on PPI therapy since she is on aspirin and Plavix. This will protect her upper GI tract. She should continue to followup with her pulmonologist and cardiologist. I would like to see her back in 4-5 months and see how she is doing clinically. She tells me that she may be off, possibly, Plavix at that time. If she is improved at that time, we can consider GI investigations. Obviously, for acute interval problems, I would be happy to see her again. She and her husband are in complete agreement with this plan.

## 2012-08-27 NOTE — Patient Instructions (Addendum)
Please follow up with Dr. [name] in 4 or 5 months after your cardiac issues have been sorted out and you are feeling stronger.

## 2012-08-27 NOTE — Assessment & Plan Note (Signed)
-   08/26/2012  Walked RA x 3 laps @ 185 ft each stopped due to end of study no desat  Symptoms are markedly disproportionate to objective findings and not clear this is a lung problem but pt does appear to have difficult airway management issues. DDX of  difficult airways managment all start with A and  include Adherence, Ace Inhibitors, Acid Reflux, Active Sinus Disease, Alpha 1 Antitripsin deficiency, Anxiety masquerading as Airways dz,  ABPA,  allergy(esp in young), Aspiration (esp in elderly), Adverse effects of DPI,  Active smokers, plus two Bs  = Bronchiectasis and Beta blocker use..and one C= CHF  ? Acid reflux > try rx  ? CHF > excluded by BNP < 100 < 1 week prior to OV

## 2012-08-27 NOTE — Assessment & Plan Note (Signed)
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

## 2012-08-30 ENCOUNTER — Encounter: Payer: Self-pay | Admitting: Internal Medicine

## 2012-08-30 ENCOUNTER — Encounter (HOSPITAL_COMMUNITY)
Admit: 2012-08-30 | Discharge: 2012-08-30 | Disposition: A | Discharge status: Home / Self Care | Payer: Medicare Other | Attending: Cardiology | Admitting: Cardiology

## 2012-08-30 ENCOUNTER — Ambulatory Visit (HOSPITAL_COMMUNITY)
Admission: RE | Admit: 2012-08-30 | Discharge: 2012-08-30 | Disposition: A | Discharge status: Home / Self Care | Payer: Medicare Other | Source: Ambulatory Visit | Attending: Internal Medicine | Admitting: Internal Medicine

## 2012-08-30 ENCOUNTER — Telehealth: Payer: Self-pay | Admitting: Internal Medicine

## 2012-08-30 DIAGNOSIS — R0602 Shortness of breath: Secondary | ICD-10-CM | POA: Insufficient documentation

## 2012-08-30 NOTE — Telephone Encounter (Signed)
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

## 2012-08-30 NOTE — Progress Notes (Signed)
Quick Note:  Mindy spoke with pt and notified of results per Dr. Sherene Sires. Pt verbalized understanding and denied any questions.  ______

## 2012-08-30 NOTE — Progress Notes (Signed)
Quick Note:  Jody Ruiz spoke with pt and notified of results per Dr. Wert. Pt verbalized understanding and denied any questions.  ______ 

## 2012-08-31 ENCOUNTER — Encounter: Payer: Self-pay | Admitting: Adult Health

## 2012-08-31 ENCOUNTER — Ambulatory Visit (INDEPENDENT_AMBULATORY_CARE_PROVIDER_SITE_OTHER): Payer: Medicare Other | Admitting: Adult Health

## 2012-08-31 VITALS — BP 140/88 | HR 107 | Temp 98.1°F | Ht 68.0 in | Wt 222.8 lb

## 2012-08-31 NOTE — Patient Instructions (Addendum)
We are setting you up for ONO to check your oxygen while you sleep We are setting you up for a sleep test.  follow up Dr. Sherene Sires  As planned and As needed   Please contact office for sooner follow up if symptoms do not improve or worsen or seek emergency care

## 2012-09-01 ENCOUNTER — Encounter (HOSPITAL_COMMUNITY): Admit: 2012-09-01 | Payer: Medicare Other

## 2012-09-01 ENCOUNTER — Telehealth (HOSPITAL_COMMUNITY): Payer: Self-pay | Admitting: Cardiac Rehabilitation

## 2012-09-01 NOTE — Telephone Encounter (Signed)
Pt will be absent today from cardiac rehab to care for sick daughter

## 2012-09-03 ENCOUNTER — Encounter (HOSPITAL_COMMUNITY)
Admit: 2012-09-03 | Discharge: 2012-09-03 | Disposition: A | Discharge status: Home / Self Care | Payer: Medicare Other | Attending: Cardiology | Admitting: Cardiology

## 2012-09-03 NOTE — Progress Notes (Signed)
  Subjective:    Patient ID: Jody Ruiz, female    DOB: 1941-02-05  MRN: [id number]  HPI 59 yowf never smoker never asthma new onset sob Jan 2014 referred 08/26/2012 to pulmonary clinic by Dr Riley Kill for abn cxr.   08/26/2012 Pulmonary eval/ Wert cc indolent onset fall 2013 progressive to point of doe x 50 ft consistently, reproducible and  No noct symptoms but husband notes respt pauses x 6 weeks and pt wakes up not feeling rested.  Better p stent then worse late January assoc with dry cough  08/31/12 Acute OV  Returns for persistent symptoms of dyspnea  Continues to have cough and dyspnea . She is not feeling any better w/ dry hacky cough,sob,wheezing,chills,denies any fever. No discolored mucus .  Complains of snoring , gasping for air. Waking her up , daytime sleepiness.  Seen for initial consult 5 days ago , started on PPI and pepcid  No change in symptoms since starting this.   She has underwent following workup :  No ambulatory desats  Neg sniff test , Neg PE on CT Nml EF, grade 1 diastolic on echo  nml bnp    Review of Systems  Constitutional: Negative for fever and unexpected weight change.  HENT: . Negative for ear pain, nosebleeds, congestion, rhinorrhea, sneezing, trouble swallowing, dental problem, postnasal drip and sinus pressure.   Eyes: Negative for redness and itching.  Respiratory: Positive for cough and shortness of breath. Negative for chest tightness and wheezing.   Cardiovascular:  Negative for palpitations and leg swelling.  Gastrointestinal: Negative for nausea and vomiting.  Genitourinary: Negative for dysuria.  Musculoskeletal: Negative for joint swelling.  Skin: Negative for rash.  Neurological: Negative for headaches.  Hematological: Does not bruise/bleed easily.  Psychiatric/Behavioral: Negative for dysphoric mood. The patient is not nervous/anxious.        Objective:   Physical Exam  amb wf nad     HEENT: nl dentition, turbinates, and  orophanx. Nl external ear canals without cough reflex   NECK :  without JVD/Nodes/TM/ nl carotid upstrokes bilaterally   LUNGS: no acc muscle use, clear to A and P bilaterally without cough on insp or exp maneuvers   CV:  RRR  no s3 or murmur or increase in P2, no edema   ABD:  soft and nontender with nl excursion in the supine position. No bruits or organomegaly, bowel sounds nl  MS:  warm without deformities, calf tenderness, cyanosis or clubbing  SKIN: warm and dry without lesions    NEURO:  alert, approp, no deficits    08/02/12 1. No radiographic evidence of acute cardiopulmonary disease.       Assessment & Plan:

## 2012-09-03 NOTE — Assessment & Plan Note (Addendum)
Persistent symptoms of dyspnea ? Etiology  Has risk factors for sleep apnea  Workup thus far has ben unrevealing. :   No ambulatory desats  Neg sniff test , Neg PE  Nml EF, grade 1 diastolic  nml bnp   Plan  We are setting you up for ONO to check your oxygen while you sleep We are setting you up for a sleep test.  follow up Dr. Sherene Sires  As planned and As needed   Please contact office for sooner follow up if symptoms do not improve or worsen or seek emergency care

## 2012-09-06 ENCOUNTER — Encounter (HOSPITAL_COMMUNITY)
Admission: RE | Admit: 2012-09-06 | Discharge: 2012-09-06 | Disposition: A | Discharge status: Home / Self Care | Payer: Medicare Other | Source: Ambulatory Visit | Attending: Cardiology | Admitting: Cardiology

## 2012-09-06 DIAGNOSIS — E785 Hyperlipidemia, unspecified: Secondary | ICD-10-CM | POA: Insufficient documentation

## 2012-09-06 DIAGNOSIS — I10 Essential (primary) hypertension: Secondary | ICD-10-CM | POA: Insufficient documentation

## 2012-09-06 DIAGNOSIS — Z7982 Long term (current) use of aspirin: Secondary | ICD-10-CM | POA: Insufficient documentation

## 2012-09-06 DIAGNOSIS — Z9861 Coronary angioplasty status: Secondary | ICD-10-CM | POA: Diagnosis not present

## 2012-09-06 DIAGNOSIS — I214 Non-ST elevation (NSTEMI) myocardial infarction: Secondary | ICD-10-CM | POA: Insufficient documentation

## 2012-09-06 DIAGNOSIS — Z5189 Encounter for other specified aftercare: Secondary | ICD-10-CM | POA: Insufficient documentation

## 2012-09-06 DIAGNOSIS — Z79899 Other long term (current) drug therapy: Secondary | ICD-10-CM | POA: Insufficient documentation

## 2012-09-06 DIAGNOSIS — Z8249 Family history of ischemic heart disease and other diseases of the circulatory system: Secondary | ICD-10-CM | POA: Insufficient documentation

## 2012-09-06 DIAGNOSIS — I2510 Atherosclerotic heart disease of native coronary artery without angina pectoris: Secondary | ICD-10-CM | POA: Insufficient documentation

## 2012-09-06 DIAGNOSIS — E669 Obesity, unspecified: Secondary | ICD-10-CM | POA: Insufficient documentation

## 2012-09-08 ENCOUNTER — Encounter (HOSPITAL_COMMUNITY)
Admit: 2012-09-08 | Discharge: 2012-09-08 | Disposition: A | Discharge status: Home / Self Care | Payer: Medicare Other | Attending: Cardiology | Admitting: Cardiology

## 2012-09-10 ENCOUNTER — Encounter (HOSPITAL_COMMUNITY): Payer: Medicare Other

## 2012-09-13 ENCOUNTER — Ambulatory Visit (HOSPITAL_BASED_OUTPATIENT_CLINIC_OR_DEPARTMENT_OTHER): Payer: Medicare Other | Attending: Adult Health | Admitting: Radiology

## 2012-09-13 ENCOUNTER — Encounter (HOSPITAL_COMMUNITY)
Admit: 2012-09-13 | Discharge: 2012-09-13 | Disposition: A | Discharge status: Home / Self Care | Payer: Medicare Other | Attending: Cardiology | Admitting: Cardiology

## 2012-09-13 VITALS — Ht 67.0 in | Wt 225.0 lb

## 2012-09-13 DIAGNOSIS — G4733 Obstructive sleep apnea (adult) (pediatric): Secondary | ICD-10-CM | POA: Diagnosis not present

## 2012-09-15 ENCOUNTER — Encounter (HOSPITAL_COMMUNITY)
Admit: 2012-09-15 | Discharge: 2012-09-15 | Disposition: A | Discharge status: Home / Self Care | Payer: Medicare Other | Attending: Cardiology | Admitting: Cardiology

## 2012-09-16 ENCOUNTER — Ambulatory Visit (INDEPENDENT_AMBULATORY_CARE_PROVIDER_SITE_OTHER): Payer: Medicare Other | Admitting: Cardiology

## 2012-09-16 ENCOUNTER — Encounter: Payer: Self-pay | Admitting: Cardiology

## 2012-09-16 VITALS — BP 118/74 | HR 85 | Ht 67.0 in | Wt 221.8 lb

## 2012-09-16 DIAGNOSIS — R918 Other nonspecific abnormal finding of lung field: Secondary | ICD-10-CM | POA: Diagnosis not present

## 2012-09-16 DIAGNOSIS — E785 Hyperlipidemia, unspecified: Secondary | ICD-10-CM

## 2012-09-16 NOTE — Assessment & Plan Note (Addendum)
See most recent cath.  Patent artery.  I will have her follow up on a long term basis with Dr. [place].  She should remain in rehab for the time being.

## 2012-09-16 NOTE — Progress Notes (Addendum)
HPI:  She comes in for follow up.  She still does not feel that well.  She has had an echo, and cath.  Also had shortness of breath and "pooped" in rehab.  Saw pulm and CT scan done.  She has a copy.  Also had sleep study and she was told OSA and they did a split study with mask.  Has FU with Dr. Sherene Sires.  She feels good about that.  She sometimes gets "hot" at rehab.    Current Outpatient Prescriptions  Medication Sig Dispense Refill  . amLODipine (NORVASC) 5 MG tablet Take 1 tablet (5 mg total) by mouth daily.  30 tablet  3  . aspirin EC 81 MG tablet Take 81 mg by mouth daily.      . clopidogrel (PLAVIX) 75 MG tablet Take 1 tablet (75 mg total) by mouth daily.  30 tablet  3  . ezetimibe (ZETIA) 10 MG tablet Take 1 tablet (10 mg total) by mouth daily.  30 tablet  3  . isosorbide mononitrate (IMDUR) 30 MG 24 hr tablet Take 1 tablet (30 mg total) by mouth daily.  30 tablet  11  . metoprolol succinate (TOPROL XL) 50 MG 24 hr tablet Take 1 tablet (50 mg total) by mouth daily. Take with or immediately following a meal.      . nitroGLYCERIN (NITROSTAT) 0.4 MG SL tablet Place 1 tablet (0.4 mg total) under the tongue every 5 (five) minutes x 3 doses as needed for chest pain.  25 tablet  3  . polyvinyl alcohol (LIQUIFILM TEARS) 1.4 % ophthalmic solution Place 1 drop into both eyes as needed. For dry eyes       No current facility-administered medications for this visit.    Allergies  Allergen Reactions  . Brilinta (Ticagrelor) Shortness Of Breath and Cough    fatigue  . Lisinopril Shortness Of Breath and Cough  . Statins     Muscle Weakness  . Macrodantin (Nitrofurantoin Macrocrystal) Rash  . Sulfa Antibiotics Rash    Past Medical History  Diagnosis Date  . Anemia   . Hx of adenomatous colonic polyps   . CAD (coronary artery disease)     a. NSTEMI 10/13 => LHC 04/28/12:  pLAD 90%, mLAD 70-80% (long), pD1 70-80%, prox Int Br 30% (small), CFX 20%, OM1 40%, mOM2 30%, pRCA 30%, PDA 30-40%, pPLB  70%, then mid 90%, EF 65%.  PCI:  Promus DES x 2 to prox and mid LAD (3x15mm and 2.5x76mm);  b. 07/2012 Cath: LM nl, LAD patent stents, LCX 20, OM1 40p, OM2 78m, RCA 30pPDA 30-40, RPL 40p, 81m, EF 55-60% ->Cont Med Rx.  Marland Kitchen Hypertension   . Microscopic colitis   . Arthritis   . Obesity   . HLD (hyperlipidemia)     intolerant to statins  . Dyspnea     a. CP and SOB 06/2012 => Ticagrelor changed to Plavix  . Myocardial infarction 04/2012     non stemi  . Hx of echocardiogram     a. Echo 2/14:  mild LVH, EF 55-65%, Gr 1 diast dysfn, mild LAE    Past Surgical History  Procedure Laterality Date  . Appendectomy    . Cholecystectomy    . Ventral hernia repair    . Breast biopsy      bilateral  . Knee arthroscopy      bilateral  . Vaginal polypectomy    . Shoulder arthroscopy  2012    left  . Cardiac  catheterization      x4  . Coronary stent placement  04/2012    Family History  Problem Relation Age of Onset  . Breast cancer Sister   . Prostate cancer Father   . Colon cancer Sister   . Colon polyps Father   . Heart disease Mother   . Heart disease Father   . Heart disease Brother   . Heart disease Sister   . Kidney disease Father     History   Social History  . Marital Status: Married    Spouse Name: N/A    Number of Children: 3  . Years of Education: N/A   Occupational History  . Retired Charity fundraiser    Social History Main Topics  . Smoking status: Never Smoker   . Smokeless tobacco: Never Used  . Alcohol Use: No  . Drug Use: No  . Sexually Active: Not on file   Other Topics Concern  . Not on file   Social History Narrative  . No narrative on file    ROS: Please see the HPI.  All other systems reviewed and negative.  PHYSICAL EXAM:  BP 118/74  Pulse 85  Ht 5\' 7"  (1.702 m)  Wt 221 lb 12.8 oz (100.608 kg)  BMI 34.73 kg/m2  SpO2 97%  General: Well developed, well nourished, in no acute distress. Head:  Normocephalic and atraumatic. Neck: no JVD Lungs:  Clear to auscultation and percussion. Heart: Normal S1 and S2.  No murmur, rubs or gallops.  Abdomen:  Normal bowel sounds; soft; non tender; no organomegaly Pulses: Pulses normal in all 4 extremities. Extremities: No clubbing or cyanosis. No edema. Neurologic: Alert and oriented x 3.  EKG:  NSR.  Nonspecific ST and T changes.  Cannot exclude ischemia.   Has had recent cath study with patent stent.  CT Angio  *RADIOLOGY REPORT*  Clinical Data: Pulmonary embolism. Increased shortness of breath and cardiac catheterization in January. Nonproductive cough.  CT ANGIOGRAPHY CHEST  Technique: Multidetector CT imaging of the chest using the standard protocol during bolus administration of intravenous contrast. Multiplanar reconstructed images including MIPs were obtained and reviewed to evaluate the vascular anatomy.  Contrast: 80mL OMNIPAQUE IOHEXOL 350 MG/ML SOLN  Comparison: Chest radiograph 08/02/2012.  Findings: Technically adequate study. No pulmonary embolus or acute aortic abnormality. Bovine aortic arch. There is no axillary adenopathy. No pericardial effusion. No pleural effusion. Left anterior descending coronary artery stent. Incidental imaging of the upper abdomen shows a small hiatal hernia and cholecystectomy clips.  Subpleural honeycombing is present at the bases bilaterally, compatible with pulmonary fibrosis.  There are multiple pulmonary nodules identified. The largest measures 8 mm adjacent to the right minor fissure and has an appearance suggesting a subpleural lymph node however follow-up is required. Other pulmonary nodules are present in subpleural locations. For example, 7 mm subpleural nodule in the superior segment of the right lower lobe (image number 32 series 6).  The central airways are patent. There is no airspace disease. Dependent atelectasis is present. No aggressive osseous lesions are present. Elevation of the right hemidiaphragm is  incidentally noted.  IMPRESSION: 1. No acute abnormality. Negative for pulmonary embolism. 2. Left anterior descending coronary artery stent. 3. Pulmonary nodules with the largest index nodule adjacent to the right major fissure (image number 39 series 6) measuring 8 mm. If the patient is at high risk for bronchogenic carcinoma, follow-up chest CT at 3-6 months is recommended. If the patient is at low risk for bronchogenic carcinoma, follow-up  chest CT at 6-12 months is recommended. This recommendation follows the consensus statement: Guidelines for Management of Small Pulmonary Nodules Detected on CT Scans: A Statement from the Fleischner Society as published in Radiology 2005; 237:395-400. 4. 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   Original Report Authenticated By: Andreas Newport, M.D.    ASSESSMENT AND PLAN:  FU Dr. Sherene Sires FU Dr. [place] in two months.  Continue cardiac rehab for now.

## 2012-09-16 NOTE — Assessment & Plan Note (Addendum)
FU with Dr Sherene Sires.  Has some abnormality by CT.  She will follow with him.

## 2012-09-16 NOTE — Patient Instructions (Signed)
Your physician recommends that you schedule a follow-up appointment in: 2 MONTHS with Dr [place] (previous pt of Dr Riley Kill)  Your physician recommends that you continue on your current medications as directed. Please refer to the Current Medication list given to you today.

## 2012-09-17 ENCOUNTER — Encounter (HOSPITAL_COMMUNITY)
Admit: 2012-09-17 | Discharge: 2012-09-17 | Disposition: A | Discharge status: Home / Self Care | Payer: Medicare Other | Attending: Cardiology | Admitting: Cardiology

## 2012-09-18 NOTE — Assessment & Plan Note (Signed)
Currently on ezetamibe alone.  Results of IMPROVE IT trial should help [place] decide if continued treatment is worthwhile.

## 2012-09-20 ENCOUNTER — Telehealth (HOSPITAL_COMMUNITY): Payer: Self-pay | Admitting: Cardiac Rehabilitation

## 2012-09-20 ENCOUNTER — Encounter: Payer: Self-pay | Admitting: Adult Health

## 2012-09-20 ENCOUNTER — Encounter (HOSPITAL_COMMUNITY): Payer: Medicare Other

## 2012-09-20 NOTE — Telephone Encounter (Signed)
Message received from patient will be absent from cardiac rehab today for inclement weather

## 2012-09-22 ENCOUNTER — Encounter (HOSPITAL_COMMUNITY): Payer: Self-pay

## 2012-09-22 ENCOUNTER — Encounter (HOSPITAL_COMMUNITY)
Admit: 2012-09-22 | Discharge: 2012-09-22 | Disposition: A | Discharge status: Home / Self Care | Payer: Medicare Other | Attending: Cardiology | Admitting: Cardiology

## 2012-09-22 NOTE — Progress Notes (Signed)
Pt graduated today from cardiac rehab. Pt has made positive lifestyle changes and should be commended for her success.  Pt plans to exercise on her own at home.

## 2012-09-24 ENCOUNTER — Encounter (HOSPITAL_COMMUNITY): Payer: Medicare Other

## 2012-10-01 ENCOUNTER — Encounter: Payer: Self-pay | Admitting: Adult Health

## 2012-10-01 DIAGNOSIS — G4730 Sleep apnea, unspecified: Secondary | ICD-10-CM | POA: Diagnosis not present

## 2012-10-01 DIAGNOSIS — G4710 Hypersomnia, unspecified: Secondary | ICD-10-CM | POA: Diagnosis not present

## 2012-10-01 NOTE — Procedures (Signed)
NAMEMARYHELEN, LINDLER             ACCOUNT NO.:  [id number]  MEDICAL RECORD NO.:  [id number]          PATIENT TYPE:  OUT  LOCATION:  SLEEP CENTER                 FACILITY:  [facility]  PHYSICIAN:  Barbaraann Share, MD,FCCPDATE OF BIRTH:  02/23/1943  DATE OF STUDY:  09/13/2012                           NOCTURNAL POLYSOMNOGRAM  REFERRING PHYSICIAN:  Rubye Oaks, NP  INDICATIONS FOR STUDY:  Hypersomnia with sleep apnea.  EPWORTH SCORE:  3.  SLEEP ARCHITECTURE:  The patient had a total sleep time of 282 minutes with very little slow-wave sleep and decreased quantity of REM.  Sleep onset latency was mildly prolonged at 38 minutes and REM onset was at the upper limits of normal at 126 minutes.  Sleep efficiency was 70% during the diagnostic portion of study and 55% during the titration portion.  RESPIRATORY DATA:  The patient underwent a split night study where she was found to have 161 obstructive events in the first 145 minutes of sleep.  This gave her an apnea-hypopnea index of 67 events per hour during the diagnostic portion of the study.  The events occurred primarily in the supine position, and there was loud snoring noted throughout.  By protocol, she was fitted with a small ResMed Mirage Quattro full-face mask, and CPAP titration was initiated.  She did have increased numbers of central apneas initially during the night; however, by the final pressure of 11 cm, she appeared to have good control of both her obstructive and central events as well as snoring.  This was achieved even during supine REM.  OXYGEN DATA:  There was O2 desaturation as low as 79% with the patient's obstructive events.  CARDIAC DATA:  Rare PVC and fusion beat noted, but no clinically significant arrhythmias were seen.  MOVEMENTS/PARASOMNIA:  The patient had no significant leg jerks or other abnormal behaviors noted.  IMPRESSION/RECOMMENDATION: 1. Severe obstructive sleep apnea/hypopnea syndrome, with an  AHI of 67     events per hour and oxygen desaturation as low as 79%.  She was     then placed on a small ResMed Mirage Quattro full-face mask, and     found to have an optimal CPAP pressure of 11 cm of water.  There     was good control of her obstructive and     central events even during supine REM. 2. Rare PVC and fusion beat noted, but no clinically significant     arrhythmias were seen.     Barbaraann Share, MD,FCCP Diplomate, American Board of Sleep Medicine    [facility]/MEDQ  D:  10/01/2012 08:34:56  T:  10/01/2012 23:40:47  Job:  [id number]

## 2012-10-05 ENCOUNTER — Encounter: Payer: Self-pay | Admitting: Adult Health

## 2012-10-05 DIAGNOSIS — G4733 Obstructive sleep apnea (adult) (pediatric): Secondary | ICD-10-CM | POA: Insufficient documentation

## 2012-10-07 ENCOUNTER — Ambulatory Visit (INDEPENDENT_AMBULATORY_CARE_PROVIDER_SITE_OTHER): Payer: Medicare Other | Admitting: Internal Medicine

## 2012-10-07 ENCOUNTER — Encounter: Payer: Self-pay | Admitting: Internal Medicine

## 2012-10-07 ENCOUNTER — Other Ambulatory Visit: Payer: Self-pay | Admitting: *Deleted

## 2012-10-07 VITALS — BP 138/76 | HR 70 | Temp 97.2°F | Ht 66.0 in | Wt 222.0 lb

## 2012-10-07 DIAGNOSIS — J84112 Idiopathic pulmonary fibrosis: Secondary | ICD-10-CM | POA: Diagnosis not present

## 2012-10-07 DIAGNOSIS — R0602 Shortness of breath: Secondary | ICD-10-CM

## 2012-10-07 DIAGNOSIS — R918 Other nonspecific abnormal finding of lung field: Secondary | ICD-10-CM

## 2012-10-07 DIAGNOSIS — I10 Essential (primary) hypertension: Secondary | ICD-10-CM

## 2012-10-07 DIAGNOSIS — I2510 Atherosclerotic heart disease of native coronary artery without angina pectoris: Secondary | ICD-10-CM

## 2012-10-07 DIAGNOSIS — R9389 Abnormal findings on diagnostic imaging of other specified body structures: Secondary | ICD-10-CM

## 2012-10-07 DIAGNOSIS — E785 Hyperlipidemia, unspecified: Secondary | ICD-10-CM

## 2012-10-07 LAB — PULMONARY FUNCTION TEST

## 2012-10-07 MED ORDER — CLOPIDOGREL BISULFATE 75 MG PO TABS: 75.0000 mg | ORAL_TABLET | Freq: Every day | ORAL | Status: DC

## 2012-10-07 NOTE — Progress Notes (Signed)
Subjective:    Patient ID: Jody Ruiz, female    DOB: [birth year]  MRN: [id number]  HPI 57 yowf never smoker never asthma new onset sob Jan 2014 referred 08/26/2012 to pulmonary clinic by Dr Riley Kill for abn cxr.   08/26/2012 Pulmonary eval/ Jody Ruiz cc indolent onset fall 2013 progressive to point of doe x 50 ft consistently, reproducible and  No noct symptoms but husband notes respt pauses x 6 weeks and pt wakes up not feeling rested.  Better p stent then worse late January assoc with dry cough  08/31/12 Acute OV  Returns for persistent symptoms of dyspnea  Continues to have cough and dyspnea . She is not feeling any better w/ dry hacky cough,sob,wheezing,chills,denies any fever. No discolored mucus .  Complains of snoring , gasping for air. Waking her up , daytime sleepiness.  Seen for initial consult 5 days ago , started on PPI and pepcid  No change in symptoms since starting this.  following workup :  No ambulatory desats  Neg sniff test , Neg PE on CT Nml EF, grade 1 diastolic on echo  nml bnp  rec We are setting you up for ONO to check your oxygen while you sleep We are setting you up for a sleep test.    10/07/2012 f/u ov/Jody Ruiz cc  Chief Complaint  Patient presents with  . Follow-up    Pt states overall breathing is doing about the same. She has had dry cough for the past 2 days, hoarsness and relates this to allergies   doe x treadmill  Only at this point.  No obvious daytime variabilty or assoc chronic cough or cp or chest tightness, subjective wheeze overt sinus or hb symptoms. No unusual exp hx or h/o childhood pna/ asthma or premature birth to his knowledge.   Sleeping ok without nocturnal  or early am exacerbation  of respiratory  c/o's or need for noct saba. Also denies any obvious fluctuation of symptoms with weather or environmental changes or other aggravating or alleviating factors except as outlined above   ROS  The following are not active complaints unless  bolded sore throat, dysphagia, dental problems, itching, sneezing,  nasal congestion or excess/ purulent secretions, ear ache,   fever, chills, sweats, unintended wt loss, pleuritic or exertional cp, hemoptysis,  orthopnea pnd or leg swelling, presyncope, palpitations, heartburn, abdominal pain, anorexia, nausea, vomiting, diarrhea  or change in bowel or urinary habits, change in stools or urine, dysuria,hematuria,  rash, arthralgias, visual complaints, headache, numbness weakness or ataxia or problems with walking or coordination,  change in mood/affect or memory.         Objective:   Physical Exam  amb wf nad     HEENT: nl dentition, turbinates, and orophanx. Nl external ear canals without cough reflex   NECK :  without JVD/Nodes/TM/ nl carotid upstrokes bilaterally   LUNGS: no acc muscle use, clear to A and P bilaterally without cough on insp or exp maneuvers   CV:  RRR  no s3 or murmur or increase in P2, no edema   ABD:  soft and nontender with nl excursion in the supine position. No bruits or organomegaly, bowel sounds nl  MS:  warm without deformities, calf tenderness, cyanosis or clubbing  SKIN: warm and dry without lesions    NEURO:  alert, approp, no deficits    08/02/12 1. No radiographic evidence of acute cardiopulmonary disease.   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

## 2012-10-07 NOTE — Patient Instructions (Addendum)
Keep appointment with Dr Vassie Loll and then decide who is going to follow up your nodules and your mild fibrosis - one of [place] needs to see you in August 2014 for this purpose and we put a reminder in the computer today to be sure it gets done

## 2012-10-07 NOTE — Progress Notes (Signed)
PFT done today. 

## 2012-10-08 ENCOUNTER — Encounter: Payer: Self-pay | Admitting: Pulmonary Disease

## 2012-10-08 ENCOUNTER — Ambulatory Visit (INDEPENDENT_AMBULATORY_CARE_PROVIDER_SITE_OTHER): Payer: Medicare Other | Admitting: Pulmonary Disease

## 2012-10-08 VITALS — BP 124/88 | HR 81 | Temp 97.5°F | Ht 66.5 in | Wt 223.4 lb

## 2012-10-08 DIAGNOSIS — R9389 Abnormal findings on diagnostic imaging of other specified body structures: Secondary | ICD-10-CM

## 2012-10-08 DIAGNOSIS — R918 Other nonspecific abnormal finding of lung field: Secondary | ICD-10-CM

## 2012-10-08 DIAGNOSIS — J84112 Idiopathic pulmonary fibrosis: Secondary | ICD-10-CM

## 2012-10-08 DIAGNOSIS — G4733 Obstructive sleep apnea (adult) (pediatric): Secondary | ICD-10-CM | POA: Diagnosis not present

## 2012-10-08 NOTE — Assessment & Plan Note (Signed)
Will need HRCT & PFts & basic serology - no clinical evidence of other causes of ILD I can Fu for this.

## 2012-10-08 NOTE — Patient Instructions (Addendum)
We will get you a CPAP machine Pl send int he card before your next appt

## 2012-10-08 NOTE — Progress Notes (Addendum)
Subjective:    Patient ID: Jody Ruiz, female    DOB: 03/12/1941, 72 y.o.   MRN: [id number]  HPI  4 yowf never smoker, retired Engineer, civil (consulting)  initially  referred 08/26/2012 to pulmonary clinic by Dr Riley Kill for abn cxr & new onset sob Jan 2014 - thought to be pulmonary fibrosis She had LAD stent in 10/13 & rpt cath 1/14 showed  Patent stent  08/26/2012 Pulmonary eval/ Wert cc indolent onset fall 2013 progressive to point of doe x 50 ft consistently, reproducible and No noct symptoms but     She  underwent following workup for dyspnea :  No ambulatory desats  Neg sniff test , Neg PE on CT  Nml EF, grade 1 diastolic on echo  nml bnp  CT chest showed Subpleural fibrosis at the lung bases & sub -sm nodles Rt fissure, largest 8mm   10/08/2012  New consult for OSA Complains of snoring , gasping for air. Waking her up , daytime sleepiness.  husband notes respt pauses x 6 weeks and pt wakes up not feeling rested. Better p stent then worse late January assoc with dry cough  ESS 5/24 but feel under-reporting. Bedtime MN, latency 10 mins, sleeps onher back or RT (lt shoulder injury) with 1 pillow, no nocturia, oob by 0900 feeling tired with dryness of mouth, no headaches. She has lost 25 lbs inth elast 2 y There is no history suggestive of cataplexy, sleep paralysis or parasomnias PSG showed AHI 66/h with predominant hypopneas & nadir desatn of 79% corrected by CPAP 11 cm, with med full face mask. She felt better rested the morning after & tolerated CPAP well.    Past Medical History  Diagnosis Date  . Anemia   . Hx of adenomatous colonic polyps   . CAD (coronary artery disease)     a. NSTEMI 10/13 => LHC 04/28/12:  pLAD 90%, mLAD 70-80% (long), pD1 70-80%, prox Int Br 30% (small), CFX 20%, OM1 40%, mOM2 30%, pRCA 30%, PDA 30-40%, pPLB 70%, then mid 90%, EF 65%.  PCI:  Promus DES x 2 to prox and mid LAD (3x16mm and 2.5x67mm);  b. 07/2012 Cath: LM nl, LAD patent stents, LCX 20, OM1 40p, OM2 67m,  RCA 30pPDA 30-40, RPL 40p, 27m, EF 55-60% ->Cont Med Rx.  Marland Kitchen Hypertension   . Microscopic colitis   . Arthritis   . Obesity   . HLD (hyperlipidemia)     intolerant to statins  . Dyspnea     a. CP and SOB 06/2012 => Ticagrelor changed to Plavix  . Myocardial infarction 04/2012     non stemi  . Hx of echocardiogram     a. Echo 2/14:  mild LVH, EF 55-65%, Gr 1 diast dysfn, mild LAE   Past Surgical History  Procedure Laterality Date  . Appendectomy    . Cholecystectomy    . Ventral hernia repair    . Breast biopsy      bilateral  . Knee arthroscopy      bilateral  . Vaginal polypectomy    . Shoulder arthroscopy  2012    left  . Cardiac catheterization      x4  . Coronary stent placement  04/2012    x2    alcohol  History   Social History  . Marital Status: Married    Spouse Name: N/A    Number of Children: 3  . Years of Education: N/A   Occupational History  . Retired Charity fundraiser  Social History Main Topics  . Smoking status: Never Smoker   . Smokeless tobacco: Never Used  . Alcohol Use: No  . Drug Use: No  . Sexually Active: Not on file   Other Topics Concern  . Not on file   Social History Narrative  . No narrative on file    Family History  Problem Relation Age of Onset  . Breast cancer Sister   . Prostate cancer Father   . Colon cancer Sister   . Colon polyps Father   . Heart disease Mother   . Heart disease Father   . Heart disease Brother   . Heart disease Sister   . Kidney disease Father      Review of Systems  Constitutional: Positive for appetite change and unexpected weight change.  HENT: Positive for congestion, sore throat and sneezing. Negative for ear pain, trouble swallowing and dental problem.   Respiratory: Negative for cough and shortness of breath.   Cardiovascular: Negative for chest pain, palpitations and leg swelling.  Gastrointestinal: Negative for abdominal pain.  Musculoskeletal: Positive for arthralgias. Negative for joint  swelling.  Skin: Negative for rash.  Neurological: Negative for headaches.  Psychiatric/Behavioral: Negative for dysphoric mood. The patient is not nervous/anxious.        Objective:   Physical Exam  Gen. Pleasant, obese, in no distress, normal affect ENT - no lesions, no post nasal drip, class 2-3 airway Neck: No JVD, no thyromegaly, no carotid bruits Lungs: no use of accessory muscles, no dullness to percussion, bibasal rales, no rhonchi  Cardiovascular: Rhythm regular, heart sounds  normal, no murmurs or gallops, no peripheral edema Abdomen: soft and non-tender, no hepatosplenomegaly, BS normal. Musculoskeletal: No deformities, no cyanosis or clubbing Neuro:  alert, non focal, no tremors       Assessment & Plan:

## 2012-10-08 NOTE — Assessment & Plan Note (Signed)
Sleep Study 09/13/12 >severe OSA w/ AHI 67 , desats 79% Start CPAP 11 cm,smal quattro mirage mask, humidity, download in 4 wks   The pathophysiology of obstructive sleep apnea , it's cardiovascular consequences & modes of treatment including CPAP were discused with the patient in detail & they evidenced understanding.  Weight loss encouraged, compliance with goal of at least 4-6 hrs every night is the expectation. Advised against medications with sedative side effects Cautioned against driving when sleepy - understanding that sleepiness will vary on a day to day basis

## 2012-10-09 ENCOUNTER — Encounter: Payer: Self-pay | Admitting: Internal Medicine

## 2012-10-09 DIAGNOSIS — J986 Disorders of diaphragm: Secondary | ICD-10-CM | POA: Insufficient documentation

## 2012-10-09 NOTE — Assessment & Plan Note (Signed)
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

## 2012-10-09 NOTE — Assessment & Plan Note (Signed)
DDx for pulmonary fibrosis  includes idiopathic pulmonary fibrosis, pulmonary fibrosis associated with rheumatologic diseases (which have a relatively benign course in most cases) , adverse effect from  drugs such as chemotherapy or amiodarone exposure, nonspecific interstitial pneumonia which is typically steroid responsive, and chronic hypersensitivity pneumonitis.   In active  smokers Langerhan's Cell  Histiocyctosis (eosinophilic granuomatosis),  DIP,  and Respiratory Bronchiolitis ILD also need to be considered,    Will need longterm f/u but also probably has osa so defer further w/u to [facility]

## 2012-10-12 ENCOUNTER — Other Ambulatory Visit: Payer: Self-pay | Admitting: *Deleted

## 2012-10-12 DIAGNOSIS — I2510 Atherosclerotic heart disease of native coronary artery without angina pectoris: Secondary | ICD-10-CM

## 2012-10-12 DIAGNOSIS — E785 Hyperlipidemia, unspecified: Secondary | ICD-10-CM

## 2012-10-12 DIAGNOSIS — I10 Essential (primary) hypertension: Secondary | ICD-10-CM

## 2012-10-12 MED ORDER — AMLODIPINE BESYLATE 5 MG PO TABS: 5.0000 mg | ORAL_TABLET | Freq: Every day | ORAL | Status: DC

## 2012-10-13 ENCOUNTER — Other Ambulatory Visit: Payer: Self-pay | Admitting: *Deleted

## 2012-10-13 DIAGNOSIS — I10 Essential (primary) hypertension: Secondary | ICD-10-CM

## 2012-10-13 DIAGNOSIS — E785 Hyperlipidemia, unspecified: Secondary | ICD-10-CM

## 2012-10-13 DIAGNOSIS — I2510 Atherosclerotic heart disease of native coronary artery without angina pectoris: Secondary | ICD-10-CM

## 2012-10-13 MED ORDER — AMLODIPINE BESYLATE 5 MG PO TABS: 5.0000 mg | ORAL_TABLET | Freq: Every day | ORAL | Status: DC

## 2012-10-21 ENCOUNTER — Other Ambulatory Visit (HOSPITAL_COMMUNITY): Payer: Self-pay | Admitting: Physician Assistant

## 2012-10-27 DIAGNOSIS — M545 Low back pain, unspecified: Secondary | ICD-10-CM | POA: Diagnosis not present

## 2012-10-27 DIAGNOSIS — M5126 Other intervertebral disc displacement, lumbar region: Secondary | ICD-10-CM | POA: Diagnosis not present

## 2012-11-16 ENCOUNTER — Ambulatory Visit (INDEPENDENT_AMBULATORY_CARE_PROVIDER_SITE_OTHER): Payer: Medicare Other | Admitting: Cardiology

## 2012-11-16 ENCOUNTER — Encounter: Payer: Self-pay | Admitting: Cardiology

## 2012-11-16 VITALS — BP 117/58 | HR 72 | Ht 66.5 in | Wt 221.8 lb

## 2012-11-16 DIAGNOSIS — E785 Hyperlipidemia, unspecified: Secondary | ICD-10-CM

## 2012-11-16 DIAGNOSIS — I10 Essential (primary) hypertension: Secondary | ICD-10-CM

## 2012-11-16 DIAGNOSIS — I2510 Atherosclerotic heart disease of native coronary artery without angina pectoris: Secondary | ICD-10-CM

## 2012-11-16 NOTE — Patient Instructions (Signed)
Stop isosorbide.  Continue your other therapy  I will see you in 6 months.

## 2012-11-16 NOTE — Progress Notes (Signed)
Jody Ruiz Date of Birth: 1941/04/08 Medical Record #[id number]  History of Present Illness: Jody Ruiz is seen today for followup. She is a former patient of Dr. Riley Ruiz. 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 She had a non-ST elevation myocardial infarction in October 2013. She had stenting of tandem lesions in the proximal and mid LAD with a 3.0 x 16 mm and 2.5 x 28 mm Promus stents respectively. In January this year she presented with some recurrent symptoms then repeat cardiac catheterization showed continued patency. She's also developed significant symptoms of dyspnea. Pulmonary evaluation demonstrated evidence of some interstitial lung disease of unclear etiology. She also had some elevation of right hemidiaphragm. She has been diagnosed with obstructive sleep apnea and is now on CPAP therapy. On followup today she is doing fairly well. She still has some shortness of breath and complains of occasional sensation that there is a band around her lower chest. She is walking daily. She has been suffering from low back pain and is being fitted for a brace.  Current Outpatient Prescriptions on File Prior to Visit  Medication Sig Dispense Refill  . amLODipine (NORVASC) 5 MG tablet Take 1 tablet (5 mg total) by mouth daily.  30 tablet  3  . aspirin EC 81 MG tablet Take 81 mg by mouth daily.      . clopidogrel (PLAVIX) 75 MG tablet Take 1 tablet (75 mg total) by mouth daily.  30 tablet  6  . isosorbide mononitrate (IMDUR) 30 MG 24 hr tablet Take 1 tablet (30 mg total) by mouth daily.  30 tablet  11  . metoprolol succinate (TOPROL XL) 50 MG 24 hr tablet Take 1 tablet (50 mg total) by mouth daily. Take with or immediately following a meal.      . nitroGLYCERIN (NITROSTAT) 0.4 MG SL tablet Place 1 tablet (0.4 mg total) under the tongue every 5 (five) minutes x 3 doses as needed for chest pain.  25 tablet  3  . polyvinyl alcohol (LIQUIFILM TEARS) 1.4 %  ophthalmic solution Place 1 drop into both eyes as needed. For dry eyes      . ZETIA 10 MG tablet TAKE 1 TABLET ONCE A DAY  30 tablet  3   No current facility-administered medications on file prior to visit.    Allergies  Allergen Reactions  . Brilinta (Ticagrelor) Shortness Of Breath and Cough    fatigue  . Lisinopril Shortness Of Breath and Cough  . Statins     Muscle Weakness  . Macrodantin (Nitrofurantoin Macrocrystal) Rash  . Sulfa Antibiotics Rash    Past Medical History  Diagnosis Date  . Anemia   . Hx of adenomatous colonic polyps   . CAD (coronary artery disease)     a. NSTEMI 10/13 => LHC 04/28/12:  pLAD 90%, mLAD 70-80% (long), pD1 70-80%, prox Int Br 30% (small), CFX 20%, OM1 40%, mOM2 30%, pRCA 30%, PDA 30-40%, pPLB 70%, then mid 90%, EF 65%.  PCI:  Promus DES x 2 to prox and mid LAD (3x78mm and 2.5x1mm);  b. 07/2012 Cath: LM nl, LAD patent stents, LCX 20, OM1 40p, OM2 78m, RCA 30pPDA 30-40, RPL 40p, 52m, EF 55-60% ->Cont Med Rx.  Marland Kitchen Hypertension   . Microscopic colitis   . Arthritis   . Obesity   . HLD (hyperlipidemia)     intolerant to statins  . Dyspnea     a. CP and SOB 06/2012 => Ticagrelor  changed to Plavix  . Myocardial infarction 04/2012     non stemi  . Hx of echocardiogram     a. Echo 2/14:  mild LVH, EF 55-65%, Gr 1 diast dysfn, mild LAE  . OSA (obstructive sleep apnea)     on CPAP    Past Surgical History  Procedure Laterality Date  . Appendectomy    . Cholecystectomy    . Ventral hernia repair    . Breast biopsy      bilateral  . Knee arthroscopy      bilateral  . Vaginal polypectomy    . Shoulder arthroscopy  2012    left  . Cardiac catheterization      x4  . Coronary stent placement  04/2012    x2    History  Smoking status  . Never Smoker   Smokeless tobacco  . Never Used    History  Alcohol Use No    Family History  Problem Relation Age of Onset  . Breast cancer Sister   . Prostate cancer Father   . Colon cancer  Sister   . Colon polyps Father   . Heart disease Mother   . Heart disease Father   . Heart disease Brother   . Heart disease Sister   . Kidney disease Father     Review of Systems: As noted in history of present illness.  All other systems were reviewed and are negative.  Physical Exam: BP 117/58  Pulse 72  Ht 5' 6.5" (1.689 m)  Wt 221 lb 12.8 oz (100.608 kg)  BMI 35.27 kg/m2  SpO2 97% She is a pleasant white female in no acute distress. HEENT: Normal No JVD or bruits. Lungs: Clear Cardiovascular: Regular rate and rhythm. Normal S1 and S2. No gallop, murmur, or click. Abdomen: Soft and nontender. No hepatosplenomegaly. Bowel sounds are positive. Extremities: No edema. Pulses are 2+ and symmetric. Skin: Warm and dry Neuro: Alert and oriented x3. Cranial nerves II through XII are intact. LABORATORY DATA:  Lab Results  Component Value Date   WBC 7.3 08/03/2012   HGB 12.8 08/03/2012   HCT 37.7 08/03/2012   PLT 273 08/03/2012   GLUCOSE 83 08/26/2012   CHOL 165 04/29/2012   TRIG 132 04/29/2012   HDL 43 04/29/2012   LDLCALC 96 04/29/2012   ALT 25 08/02/2012   AST 23 08/02/2012   NA 139 08/26/2012   K 4.3 08/26/2012   CL 103 08/26/2012   CREATININE 0.8 08/26/2012   BUN 18 08/26/2012   CO2 29 08/26/2012   INR 0.93 08/02/2012    Assessment / Plan: 1. Coronary disease. Status post non-ST elevation myocardial infarction October 2013. Status post stenting of tandem lesions in the proximal and mid LAD. Recommend continued aspirin and Plavix for 6 more months.. Plavix at that point. Continue risk factor modification. I recommended stopping her isosorbide at this point.  2. Dyslipidemia. She is intolerant to statins. Continue Zetia.  3. Interstitial lung disease.  4. Obstructive sleep apnea.  I'll followup again in 6 months.

## 2012-11-22 ENCOUNTER — Encounter: Payer: Self-pay | Admitting: Pulmonary Disease

## 2012-11-22 ENCOUNTER — Ambulatory Visit (INDEPENDENT_AMBULATORY_CARE_PROVIDER_SITE_OTHER): Payer: Medicare Other | Admitting: Pulmonary Disease

## 2012-11-22 VITALS — BP 122/66 | HR 68 | Temp 97.8°F | Ht 66.0 in | Wt 227.0 lb

## 2012-11-22 DIAGNOSIS — G4733 Obstructive sleep apnea (adult) (pediatric): Secondary | ICD-10-CM

## 2012-11-22 DIAGNOSIS — R911 Solitary pulmonary nodule: Secondary | ICD-10-CM | POA: Insufficient documentation

## 2012-11-22 DIAGNOSIS — J84112 Idiopathic pulmonary fibrosis: Secondary | ICD-10-CM | POA: Diagnosis not present

## 2012-11-22 NOTE — Assessment & Plan Note (Signed)
Ct scan in end august Lung function is stable - diffusion is reduced at 63% Blood work in future

## 2012-11-22 NOTE — Progress Notes (Signed)
  Subjective:    Patient ID: Jody Ruiz, female    DOB: January 02, 1941, 72 y.o.   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  initially referred 08/26/2012 to pulmonary clinic by Dr Riley Kill for abn cxr & new onset sob Jan 2014 - thought to be pulmonary fibrosis  She had LAD stent in 10/13 & rpt cath 1/14 showed Patent stent   08/26/2012 Pulmonary eval/ Wert cc indolent onset fall 2013 progressive to point of doe x 50 ft consistently, reproducible and No noct symptoms but  She underwent following workup for dyspnea :  No ambulatory desats  Neg sniff test , Neg PE on CT  Nml EF, grade 1 diastolic on echo  nml bnp  CT chest showed Subpleural fibrosis at the lung bases & sub -sm nodles Rt fissure, largest 8mm   10/08/2012 New consult for OSA  PSG showed AHI 66/h with predominant hypopneas & nadir desatn of 79% corrected by CPAP 11 cm, with med full face mask. She felt better rested the morning after & tolerated CPAP well.   11/22/2012 Started on CPAP 11 cm Currently using CPAP every night for 7 hours. Reports having issues with the mask. Denies any problems with the machine or pressure. C/o Neck restriction Changed to softer gel mask Pillows caused mouth leak Download 5/14 shows residual 9/h, good compliance, small leak   Past Medical History  Diagnosis Date  . Anemia   . Hx of adenomatous colonic polyps   . CAD (coronary artery disease)     a. NSTEMI 10/13 => LHC 04/28/12:  pLAD 90%, mLAD 70-80% (long), pD1 70-80%, prox Int Br 30% (small), CFX 20%, OM1 40%, mOM2 30%, pRCA 30%, PDA 30-40%, pPLB 70%, then mid 90%, EF 65%.  PCI:  Promus DES x 2 to prox and mid LAD (3x70mm and 2.5x84mm);  b. 07/2012 Cath: LM nl, LAD patent stents, LCX 20, OM1 40p, OM2 57m, RCA 30pPDA 30-40, RPL 40p, 50m, EF 55-60% ->Cont Med Rx.  Marland Kitchen Hypertension   . Microscopic colitis   . Arthritis   . Obesity   . HLD (hyperlipidemia)     intolerant to statins  . Dyspnea     a. CP and  SOB 06/2012 => Ticagrelor changed to Plavix  . Myocardial infarction 04/2012     non stemi  . Hx of echocardiogram     a. Echo 2/14:  mild LVH, EF 55-65%, Gr 1 diast dysfn, mild LAE  . OSA (obstructive sleep apnea)     on CPAP    Review of Systems neg for any significant sore throat, dysphagia, itching, sneezing, nasal congestion or excess/ purulent secretions, fever, chills, sweats, unintended wt loss, pleuritic or exertional cp, hempoptysis, orthopnea pnd or change in chronic leg swelling. Also denies presyncope, palpitations, heartburn, abdominal pain, nausea, vomiting, diarrhea or change in bowel or urinary habits, dysuria,hematuria, rash, arthralgias, visual complaints, headache, numbness weakness or ataxia.     Objective:   Physical Exam  Gen. Pleasant, obese, in no distress ENT - no lesions, no post nasal drip Neck: No JVD, no thyromegaly, no carotid bruits Lungs: no use of accessory muscles, no dullness to percussion, bibasal rales ,no rhonchi  Cardiovascular: Rhythm regular, heart sounds  normal, no murmurs or gallops, no peripheral edema Musculoskeletal: No deformities, no cyanosis or clubbing , no tremors       Assessment & Plan:

## 2012-11-22 NOTE — Assessment & Plan Note (Signed)
CPAP 11 cm is very effective  Weight loss encouraged, compliance with goal of at least 4-6 hrs every night is the expectation. Advised against medications with sedative side effects Cautioned against driving when sleepy - understanding that sleepiness will vary on a day to day basis

## 2012-11-22 NOTE — Patient Instructions (Addendum)
Ct scan in end august Lung function is stable - diffusion is reduced at 63% CPAP 11 cm is very effective

## 2012-11-22 NOTE — Assessment & Plan Note (Signed)
89m FU

## 2012-11-30 ENCOUNTER — Telehealth: Payer: Self-pay | Admitting: Pulmonary Disease

## 2012-11-30 DIAGNOSIS — G4733 Obstructive sleep apnea (adult) (pediatric): Secondary | ICD-10-CM

## 2012-11-30 NOTE — Telephone Encounter (Signed)
Will have DME make changes to pressure to prevent air in stomach Pl ask DME to add cflex of 3

## 2012-11-30 NOTE — Telephone Encounter (Signed)
I spoke with the pt and she states she saw RA on 11-22-12 and was doing well with her cpap but over the last 3 nights she has begun to have issues. She states she puts her mask on at night like usual and turns machine on and is able to go to sleep with out issues. She states after 4-5 hours she is woken up by a burning in her chest and a bloated feeling and she takes the mask off. She states she belches a lot when she takes the mask off. She states she has not changed anything with her machine, mask fits well, does not feel like there are any leaks. She states she was able to use her machine at least 8 hours a night but now it is a struggle to meet the 4 hour requirement. Please advise. Carron Curie, CMA Allergies  Allergen Reactions  . Brilinta (Ticagrelor) Shortness Of Breath and Cough    fatigue  . Lisinopril Shortness Of Breath and Cough  . Statins     Muscle Weakness  . Macrodantin (Nitrofurantoin Macrocrystal) Rash  . Sulfa Antibiotics Rash

## 2012-11-30 NOTE — Telephone Encounter (Signed)
Spoke with the pt and notified of recs per RA  She verbalized understanding  Order was sent to [facility] 

## 2012-12-02 DIAGNOSIS — S8000XA Contusion of unspecified knee, initial encounter: Secondary | ICD-10-CM | POA: Diagnosis not present

## 2012-12-02 DIAGNOSIS — S40019A Contusion of unspecified shoulder, initial encounter: Secondary | ICD-10-CM | POA: Diagnosis not present

## 2012-12-02 DIAGNOSIS — S60229A Contusion of unspecified hand, initial encounter: Secondary | ICD-10-CM | POA: Diagnosis not present

## 2012-12-02 DIAGNOSIS — M1710 Unilateral primary osteoarthritis, unspecified knee: Secondary | ICD-10-CM | POA: Diagnosis not present

## 2012-12-13 DIAGNOSIS — Z1231 Encounter for screening mammogram for malignant neoplasm of breast: Secondary | ICD-10-CM | POA: Diagnosis not present

## 2012-12-15 DIAGNOSIS — S40019A Contusion of unspecified shoulder, initial encounter: Secondary | ICD-10-CM | POA: Diagnosis not present

## 2012-12-15 DIAGNOSIS — S60229A Contusion of unspecified hand, initial encounter: Secondary | ICD-10-CM | POA: Diagnosis not present

## 2013-01-03 ENCOUNTER — Telehealth: Payer: Self-pay | Admitting: Pulmonary Disease

## 2013-01-03 DIAGNOSIS — G4733 Obstructive sleep apnea (adult) (pediatric): Secondary | ICD-10-CM

## 2013-01-03 NOTE — Telephone Encounter (Signed)
I spoke with pt. She stated she is having problems with CPAP. Pt stated lately she feels like she is filling up with air, and then will have to take CPAP off and the air "expels out". She stated this happens until she gets all the air out of her "gut". She is only getting 3-4 hrs a night out of CPAP. She stated her machine is set up at 11 CM. She stated she is not sure what else to do. Please advised Dr. Vassie Loll thanks   --pt aware RA not in the office until tomorrow.

## 2013-01-03 NOTE — Telephone Encounter (Signed)
Let her know we wil drop CPAP pressure Change to auto 5-11 cm, download in 4 wks

## 2013-01-03 NOTE — Telephone Encounter (Signed)
Returning call.Jody Ruiz ° °

## 2013-01-04 NOTE — Telephone Encounter (Signed)
Pt returned call. Emily E McAlister °

## 2013-01-04 NOTE — Telephone Encounter (Signed)
Pt aware of order placed to [facility] to change to auto and download in 4 weeks.

## 2013-01-04 NOTE — Telephone Encounter (Signed)
LMTCB x1 Order placed 

## 2013-01-13 DIAGNOSIS — S8000XA Contusion of unspecified knee, initial encounter: Secondary | ICD-10-CM | POA: Diagnosis not present

## 2013-01-13 DIAGNOSIS — M1710 Unilateral primary osteoarthritis, unspecified knee: Secondary | ICD-10-CM | POA: Diagnosis not present

## 2013-01-13 DIAGNOSIS — S60229A Contusion of unspecified hand, initial encounter: Secondary | ICD-10-CM | POA: Diagnosis not present

## 2013-01-21 DIAGNOSIS — M19019 Primary osteoarthritis, unspecified shoulder: Secondary | ICD-10-CM | POA: Diagnosis not present

## 2013-01-27 DIAGNOSIS — M19019 Primary osteoarthritis, unspecified shoulder: Secondary | ICD-10-CM | POA: Diagnosis not present

## 2013-02-03 DIAGNOSIS — I10 Essential (primary) hypertension: Secondary | ICD-10-CM | POA: Diagnosis not present

## 2013-02-03 DIAGNOSIS — E785 Hyperlipidemia, unspecified: Secondary | ICD-10-CM | POA: Diagnosis not present

## 2013-02-03 DIAGNOSIS — E669 Obesity, unspecified: Secondary | ICD-10-CM | POA: Diagnosis not present

## 2013-02-03 DIAGNOSIS — R7301 Impaired fasting glucose: Secondary | ICD-10-CM | POA: Diagnosis not present

## 2013-02-03 DIAGNOSIS — Z6833 Body mass index (BMI) 33.0-33.9, adult: Secondary | ICD-10-CM | POA: Diagnosis not present

## 2013-02-03 DIAGNOSIS — E039 Hypothyroidism, unspecified: Secondary | ICD-10-CM | POA: Diagnosis not present

## 2013-02-03 DIAGNOSIS — I2510 Atherosclerotic heart disease of native coronary artery without angina pectoris: Secondary | ICD-10-CM | POA: Diagnosis not present

## 2013-02-03 DIAGNOSIS — M1990 Unspecified osteoarthritis, unspecified site: Secondary | ICD-10-CM | POA: Diagnosis not present

## 2013-02-08 DIAGNOSIS — I10 Essential (primary) hypertension: Secondary | ICD-10-CM | POA: Diagnosis not present

## 2013-02-08 DIAGNOSIS — N2889 Other specified disorders of kidney and ureter: Secondary | ICD-10-CM | POA: Diagnosis not present

## 2013-02-08 DIAGNOSIS — R109 Unspecified abdominal pain: Secondary | ICD-10-CM | POA: Diagnosis not present

## 2013-02-08 DIAGNOSIS — R82998 Other abnormal findings in urine: Secondary | ICD-10-CM | POA: Diagnosis not present

## 2013-02-08 DIAGNOSIS — R509 Fever, unspecified: Secondary | ICD-10-CM | POA: Diagnosis not present

## 2013-02-08 DIAGNOSIS — Z6833 Body mass index (BMI) 33.0-33.9, adult: Secondary | ICD-10-CM | POA: Diagnosis not present

## 2013-02-08 DIAGNOSIS — M1990 Unspecified osteoarthritis, unspecified site: Secondary | ICD-10-CM | POA: Diagnosis not present

## 2013-02-08 DIAGNOSIS — L821 Other seborrheic keratosis: Secondary | ICD-10-CM | POA: Diagnosis not present

## 2013-02-08 DIAGNOSIS — N1330 Unspecified hydronephrosis: Secondary | ICD-10-CM | POA: Diagnosis not present

## 2013-02-09 ENCOUNTER — Other Ambulatory Visit: Payer: Self-pay

## 2013-02-09 DIAGNOSIS — N289 Disorder of kidney and ureter, unspecified: Secondary | ICD-10-CM | POA: Diagnosis not present

## 2013-02-11 DIAGNOSIS — Z124 Encounter for screening for malignant neoplasm of cervix: Secondary | ICD-10-CM | POA: Diagnosis not present

## 2013-02-12 ENCOUNTER — Telehealth: Payer: Self-pay | Admitting: Pulmonary Disease

## 2013-02-12 NOTE — Telephone Encounter (Signed)
Download on auto 5-11 02/04/13 >> residual 7/h ok Good usqge, min leak Hope bloating is gone

## 2013-02-14 ENCOUNTER — Telehealth: Payer: Self-pay | Admitting: *Deleted

## 2013-02-14 ENCOUNTER — Other Ambulatory Visit: Payer: Self-pay | Admitting: Cardiology

## 2013-02-14 NOTE — Telephone Encounter (Signed)
Pt set for ct chest per Dr Vassie Loll- appt for 02/09/13

## 2013-02-14 NOTE — Telephone Encounter (Signed)
I spoke with patient about results and she verbalized understanding and had no questions 

## 2013-02-14 NOTE — Telephone Encounter (Signed)
error 

## 2013-02-14 NOTE — Telephone Encounter (Signed)
Message copied by Christen Butter on Mon Feb 14, 2013 12:10 PM ------      Message from: Sandrea Hughs B      Created: Thu Oct 07, 2012 10:14 AM       Make sure she's had a f/u ct chest non contrast and f/u with me or Vassie Loll planned ------

## 2013-03-01 ENCOUNTER — Ambulatory Visit (INDEPENDENT_AMBULATORY_CARE_PROVIDER_SITE_OTHER)
Admission: RE | Admit: 2013-03-01 | Discharge: 2013-03-01 | Disposition: A | Discharge status: Home / Self Care | Payer: Medicare Other | Source: Ambulatory Visit | Attending: Pulmonary Disease | Admitting: Pulmonary Disease

## 2013-03-01 DIAGNOSIS — J984 Other disorders of lung: Secondary | ICD-10-CM | POA: Diagnosis not present

## 2013-03-01 DIAGNOSIS — R911 Solitary pulmonary nodule: Secondary | ICD-10-CM

## 2013-03-08 ENCOUNTER — Telehealth: Payer: Self-pay | Admitting: Pulmonary Disease

## 2013-03-08 NOTE — Telephone Encounter (Signed)
I spoke with pt. She is wanting to scheduled appt with RA. She is scheduled in [place] and is aware of directions. Nothing further needed

## 2013-03-17 DIAGNOSIS — M7500 Adhesive capsulitis of unspecified shoulder: Secondary | ICD-10-CM | POA: Diagnosis not present

## 2013-03-21 ENCOUNTER — Telehealth: Payer: Self-pay | Admitting: Pulmonary Disease

## 2013-03-21 NOTE — Telephone Encounter (Signed)
Recd records from [facility]. Forwarding 2pgs to [facility]

## 2013-03-22 ENCOUNTER — Encounter: Payer: Self-pay | Admitting: Pulmonary Disease

## 2013-03-22 ENCOUNTER — Ambulatory Visit (INDEPENDENT_AMBULATORY_CARE_PROVIDER_SITE_OTHER): Payer: Medicare Other | Admitting: Pulmonary Disease

## 2013-03-22 VITALS — BP 124/76 | HR 67 | Temp 98.1°F | Ht 66.0 in | Wt 221.5 lb

## 2013-03-22 DIAGNOSIS — G4733 Obstructive sleep apnea (adult) (pediatric): Secondary | ICD-10-CM

## 2013-03-22 DIAGNOSIS — R911 Solitary pulmonary nodule: Secondary | ICD-10-CM

## 2013-03-22 DIAGNOSIS — J84112 Idiopathic pulmonary fibrosis: Secondary | ICD-10-CM | POA: Diagnosis not present

## 2013-03-22 DIAGNOSIS — J209 Acute bronchitis, unspecified: Secondary | ICD-10-CM | POA: Diagnosis not present

## 2013-03-22 MED ORDER — AMOXICILLIN-POT CLAVULANATE 875-125 MG PO TABS: 1.0000 | ORAL_TABLET | Freq: Two times a day (BID) | ORAL | Status: DC

## 2013-03-22 NOTE — Assessment & Plan Note (Signed)
Favor IPF Rpt PFts for decline, if so, can consider perfenidone May need serology testing

## 2013-03-22 NOTE — Assessment & Plan Note (Signed)
Augmentin x 7ds with probiotic mucinex

## 2013-03-22 NOTE — Patient Instructions (Addendum)
Augmentin 875 x 7ds Take pro-biotic with this Mucinex 600 twice daily Call if no better Lung function test in 2 mnths with FU CPAP -settings ok, get back on this when better

## 2013-03-22 NOTE — Assessment & Plan Note (Signed)
On auto , due to bloating on fixed pressure

## 2013-03-22 NOTE — Assessment & Plan Note (Signed)
Stable on CT - 1 yr FU in 02/2014

## 2013-03-22 NOTE — Progress Notes (Signed)
Subjective:    Patient ID: Jody Ruiz, female    DOB: Apr 15, 1941, 72 y.o.   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  Initially referred 08/26/2012 to pulmonary clinic by Dr Riley Kill for abn cxr & new onset sob Jan 2014 - thought to be pulmonary fibrosis  She had LAD stent in 10/13 & rpt cath 1/14 showed Patent stent   She underwent following workup for dyspnea :  No ambulatory desats  Elevated rt hemi-D due to eventration, Neg sniff test , Neg PE on CT  Nml EF, grade 1 diastolic on echo  nml bnp  CT chest showed Subpleural fibrosis at the lung bases & sub -sm nodles Rt fissure, largest 8mm  PSG showed AHI 66/h with predominant hypopneas & nadir desatn of 79% corrected by CPAP 11 cm, with med full face mask.   Download 5/14 on 12 cm showed residual 9/h, good compliance, small leak  03/22/2013  Pt reports her breathing has been terrible, cough w/ yellow-white thick phlem, nasal congestion, PND, wheezing, chest tx x 2 weeks. She does have chills/sweats last night.   Pt reports she wears her CPAP everynight x 8-10 hrs normally. Last night she was only able to wear it about 1 1/2 hrs d/t being sick.   For stomach insufflation,added cflex of 3, then changed to auto Download on auto 5-11 02/04/13 >> residual 7/h ok  Good usage, min leak , bloating improved  CT chest >> Nodules are stable Fibrosis slight worse than feb 2014  She returned after a 2 wk vacation to [place] & developed URI, Took cipro for UTI prior to travel & another course after Still c/o green phlegm, congestion & cough & subjective fever, chills,   Past Medical History  Diagnosis Date  . Anemia   . Hx of adenomatous colonic polyps   . CAD (coronary artery disease)     a. NSTEMI 10/13 => LHC 04/28/12:  pLAD 90%, mLAD 70-80% (long), pD1 70-80%, prox Int Br 30% (small), CFX 20%, OM1 40%, mOM2 30%, pRCA 30%, PDA 30-40%, pPLB 70%, then mid 90%, EF 65%.  PCI:  Promus DES x 2 to prox  and mid LAD (3x89mm and 2.5x2mm);  b. 07/2012 Cath: LM nl, LAD patent stents, LCX 20, OM1 40p, OM2 19m, RCA 30pPDA 30-40, RPL 40p, 31m, EF 55-60% ->Cont Med Rx.  Marland Kitchen Hypertension   . Microscopic colitis   . Arthritis   . Obesity   . HLD (hyperlipidemia)     intolerant to statins  . Dyspnea     a. CP and SOB 06/2012 => Ticagrelor changed to Plavix  . Myocardial infarction 04/2012     non stemi  . Hx of echocardiogram     a. Echo 2/14:  mild LVH, EF 55-65%, Gr 1 diast dysfn, mild LAE  . OSA (obstructive sleep apnea)     on CPAP     Review of Systems neg for any significant sore throat, dysphagia, itching, sneezing, nasal congestion or excess/ purulent secretions, sweats, unintended wt loss, pleuritic or exertional cp, hempoptysis, orthopnea pnd or change in chronic leg swelling. Also denies presyncope, palpitations, heartburn, abdominal pain, nausea, vomiting, diarrhea or change in bowel or urinary habits, dysuria,hematuria, rash, arthralgias, visual complaints, headache, numbness weakness or ataxia.     Objective:   Physical Exam  Gen. Pleasant, well-nourished, in no distress, normal affect ENT - no lesions, no post nasal drip Neck: No JVD, no  thyromegaly, no carotid bruits Lungs: no use of accessory muscles, no dullness to percussion, bibasal rales, no rhonchi  Cardiovascular: Rhythm regular, heart sounds  normal, no murmurs or gallops, no peripheral edema Abdomen: soft and non-tender, no hepatosplenomegaly, BS normal. Musculoskeletal: No deformities, no cyanosis or clubbing Neuro:  alert, non focal       Assessment & Plan:

## 2013-03-24 DIAGNOSIS — M7530 Calcific tendinitis of unspecified shoulder: Secondary | ICD-10-CM | POA: Diagnosis not present

## 2013-03-24 DIAGNOSIS — M25519 Pain in unspecified shoulder: Secondary | ICD-10-CM | POA: Diagnosis not present

## 2013-03-24 DIAGNOSIS — M25669 Stiffness of unspecified knee, not elsewhere classified: Secondary | ICD-10-CM | POA: Diagnosis not present

## 2013-03-25 DIAGNOSIS — M25669 Stiffness of unspecified knee, not elsewhere classified: Secondary | ICD-10-CM | POA: Diagnosis not present

## 2013-03-25 DIAGNOSIS — M25519 Pain in unspecified shoulder: Secondary | ICD-10-CM | POA: Diagnosis not present

## 2013-03-25 DIAGNOSIS — M7530 Calcific tendinitis of unspecified shoulder: Secondary | ICD-10-CM | POA: Diagnosis not present

## 2013-03-28 DIAGNOSIS — M25519 Pain in unspecified shoulder: Secondary | ICD-10-CM | POA: Diagnosis not present

## 2013-03-28 DIAGNOSIS — M7530 Calcific tendinitis of unspecified shoulder: Secondary | ICD-10-CM | POA: Diagnosis not present

## 2013-03-28 DIAGNOSIS — M25669 Stiffness of unspecified knee, not elsewhere classified: Secondary | ICD-10-CM | POA: Diagnosis not present

## 2013-03-30 ENCOUNTER — Ambulatory Visit (INDEPENDENT_AMBULATORY_CARE_PROVIDER_SITE_OTHER): Payer: Medicare Other | Admitting: Physician Assistant

## 2013-03-30 ENCOUNTER — Encounter: Payer: Self-pay | Admitting: Physician Assistant

## 2013-03-30 VITALS — BP 150/80 | HR 66 | Wt 224.0 lb

## 2013-03-30 DIAGNOSIS — K219 Gastro-esophageal reflux disease without esophagitis: Secondary | ICD-10-CM | POA: Diagnosis not present

## 2013-03-30 DIAGNOSIS — I10 Essential (primary) hypertension: Secondary | ICD-10-CM

## 2013-03-30 DIAGNOSIS — I2510 Atherosclerotic heart disease of native coronary artery without angina pectoris: Secondary | ICD-10-CM

## 2013-03-30 DIAGNOSIS — R079 Chest pain, unspecified: Secondary | ICD-10-CM

## 2013-03-30 MED ORDER — ISOSORBIDE MONONITRATE ER 30 MG PO TB24: 30.0000 mg | ORAL_TABLET | Freq: Every day | ORAL | Status: DC

## 2013-03-30 MED ORDER — PANTOPRAZOLE SODIUM 40 MG PO TBEC: 40.0000 mg | DELAYED_RELEASE_TABLET | Freq: Every day | ORAL | Status: DC

## 2013-03-30 NOTE — Assessment & Plan Note (Signed)
Patient has had continuous chest pain since Monday associated with belching. She has been on 3 different antibiotics in the past month and has had a lot of upper respiratory infections. She had 2 stents placed in the proximal and mid LAD in October 2013. Followup cath in January 2014 showed patent stents with some improvement in the PLA lesion. I discussed this patient with Dr. Excell Seltzer who recommends proceeding with stress Myoview to rule out ischemia. I gave her prescription for Protonix 40 mg daily and Imdur 30 mg daily. Stress test will be done tomorrow and she will see Dr. [place] next week.

## 2013-03-30 NOTE — Patient Instructions (Addendum)
START PROTONIX 40 MG DAILY  START IMDUR 30MG  DAILY  Your physician has requested that you have en exercise stress myoview. For further information please visit [email or web address] Please follow instruction sheet, as given. (TO BE SCHEDULED TOMORROW/ASAP)  Your physician recommends that you schedule a follow-up appointment in: 1 WEEK DR.JORDAN

## 2013-03-30 NOTE — Progress Notes (Signed)
HPI:  This is a 71 year old retired Engineer, civil (consulting) who is a patient of Dr. Swaziland who has history of coronary artery disease status post non-ST elevation MI treated with stenting of the proximal and mid LAD in October 2013. Repeat cardiac catheterization in January 2014 for similar symptoms showed continued patency however stents. She does have residual disease in the PLA that may amenable to PCI but it was a small vessel and actually looked better.   Since then she has had a pulmonary evaluation and has been diagnosed with idiopathic pulmonary fibrosis. She is also on CPAP for obstructive sleep apnea. Over the past month she has been on 3 different antibiotics for upper respiratory infections. Last week she was given a prescription for Augmentin which she took for 5 days. She finished this on Saturday. On Monday she developed chest heaviness associated with belching. This is similar to her prior pain but it has been constant. She has been taking a lot of TUMS. Exertion does not worsen the chest pain. She has no radiation, dyspnea, dizziness or presyncope.   Allergies  -- Brilinta [Ticagrelor] -- Shortness Of Breath and Cough   --  fatigue  -- Lisinopril -- Shortness Of Breath and Cough  -- Statins    --  Muscle Weakness  -- Macrodantin [Nitrofurantoin Macrocrystal] -- Rash  -- Sulfa Antibiotics -- Rash  Current Outpatient Prescriptions on File Prior to Visit: amLODipine (NORVASC) 5 MG tablet, Take 1 tablet (5 mg total) by mouth daily., Disp: 30 tablet, Rfl: 3 aspirin EC 81 MG tablet, Take 81 mg by mouth daily., Disp: , Rfl:  clopidogrel (PLAVIX) 75 MG tablet, Take 1 tablet (75 mg total) by mouth daily., Disp: 30 tablet, Rfl: 6 metoprolol succinate (TOPROL XL) 50 MG 24 hr tablet, Take 1 tablet (50 mg total) by mouth daily. Take with or immediately following a meal., Disp: , Rfl:  nitroGLYCERIN (NITROSTAT) 0.4 MG SL tablet, Place 1 tablet (0.4 mg total) under the tongue every 5 (five) minutes x 3 doses as  needed for chest pain., Disp: 25 tablet, Rfl: 3 polyvinyl alcohol (LIQUIFILM TEARS) 1.4 % ophthalmic solution, Place 1 drop into both eyes as needed. For dry eyes, Disp: , Rfl:   No current facility-administered medications on file prior to visit.   Past Medical History:   Anemia                                                       Hx of adenomatous colonic polyps                             CAD (coronary artery disease)                                  Comment:a. NSTEMI 10/13 => LHC 04/28/12:  pLAD 90%,               mLAD 70-80% (long), pD1 70-80%, prox Int Br 30%              (small), CFX 20%, OM1 40%, mOM2 30%, pRCA 30%,               PDA 30-40%, pPLB 70%, then mid 90%, EF 65%.  PCI:  Promus DES x 2 to prox and mid LAD               (3x30mm and 2.5x67mm);  b. 07/2012 Cath: LM nl,               LAD patent stents, LCX 20, OM1 40p, OM2 53m,               RCA 30pPDA 30-40, RPL 40p, 86m, EF 55-60%               ->Cont Med Rx.   Hypertension                                                 Microscopic colitis                                          Arthritis                                                    Obesity                                                      HLD (hyperlipidemia)                                           Comment:intolerant to statins   Dyspnea                                                        Comment:a. CP and SOB 06/2012 => Ticagrelor changed to               Plavix   Myocardial infarction                           04/2012        Comment:non stemi   Hx of echocardiogram                                           Comment:a. Echo 2/14:  mild LVH, EF 55-65%, Gr 1 diast               dysfn, mild LAE   OSA (obstructive sleep apnea)                                  Comment:on CPAP  Past Surgical History:   APPENDECTOMY  CHOLECYSTECTOMY                                               VENTRAL HERNIA  REPAIR                                         BREAST BIOPSY                                                   Comment:bilateral   KNEE ARTHROSCOPY                                                Comment:bilateral   vaginal polypectomy                                           SHOULDER ARTHROSCOPY                             2012           Comment:left   CARDIAC CATHETERIZATION                                         Comment:x4   CORONARY STENT PLACEMENT                         04/2012        Comment:x2  Review of patient's family history indicates:   Breast cancer                  Sister                   Prostate cancer                Father                   Colon cancer                   Sister                   Colon polyps                   Father                   Heart disease                  Mother                   Heart disease                  Father  Heart disease                  Brother                  Heart disease                  Sister                   Kidney disease                 Father                   Social History   Marital Status: Married             Spouse Name:                      Years of Education:                 Number of children: 3           Occupational History Occupation          Psychiatric nurse              Retired Charity fundraiser                                Social History Main Topics   Smoking Status: Never Smoker                     Smokeless Status: Never Used                       Alcohol Use: No             Drug Use: No             Sexual Activity: Not on file        Other Topics            Concern   None on file  Social History Narrative   None on file    ROS: See history of present illness otherwise negative   PHYSICAL EXAM: Well-nournished, in no acute distress. Neck: No JVD, HJR, Bruit, or thyroid enlargement  Lungs: No tachypnea, clear without wheezing, rales, or rhonchi  Cardiovascular:  RRR, PMI not displaced, positive S4 1/6 systolic murmur at the left sternal border, no bruit, thrill, or heave.  Abdomen: BS normal. Soft without organomegaly, masses, lesions or tenderness.  Extremities: without cyanosis, clubbing or edema. Good distal pulses bilateral  SKin: Warm, no lesions or rashes   Musculoskeletal: No deformities  Neuro: no focal signs  BP 150/80  Pulse 66  Wt 224 lb (101.606 kg)  BMI 36.17 kg/m2   EKG: Normal sinus rhythm low-voltage nonspecific ST changes no acute change

## 2013-03-30 NOTE — Assessment & Plan Note (Signed)
Blood pressure up today. Have added Imdur 30 mg daily

## 2013-03-31 ENCOUNTER — Ambulatory Visit (HOSPITAL_COMMUNITY): Payer: Medicare Other | Attending: Cardiology | Admitting: Radiology

## 2013-03-31 VITALS — BP 118/63 | HR 61 | Ht 68.0 in | Wt 222.0 lb

## 2013-03-31 DIAGNOSIS — R0602 Shortness of breath: Secondary | ICD-10-CM

## 2013-03-31 DIAGNOSIS — R079 Chest pain, unspecified: Secondary | ICD-10-CM

## 2013-03-31 DIAGNOSIS — I2510 Atherosclerotic heart disease of native coronary artery without angina pectoris: Secondary | ICD-10-CM

## 2013-03-31 MED ORDER — TECHNETIUM TC 99M SESTAMIBI GENERIC - CARDIOLITE
33.0000 | Freq: Once | INTRAVENOUS | Status: AC | PRN
  Administered 2013-03-31: 33 via INTRAVENOUS

## 2013-03-31 MED ORDER — REGADENOSON 0.4 MG/5ML IV SOLN
0.4000 mg | Freq: Once | INTRAVENOUS | Status: AC
  Administered 2013-03-31: 0.4 mg via INTRAVENOUS

## 2013-03-31 MED ORDER — TECHNETIUM TC 99M SESTAMIBI GENERIC - CARDIOLITE
10.7000 | Freq: Once | INTRAVENOUS | Status: AC | PRN
  Administered 2013-03-31: 11 via INTRAVENOUS

## 2013-03-31 NOTE — Progress Notes (Signed)
[facility] SITE 3 NUCLEAR MED [address] [place], [place] [address] [phone]    Cardiology Nuclear Med Study  Jody Ruiz is a 72 y.o. female     MRN : [id number]     DOB: [birth year]  Procedure Date: 03/31/2013  Nuclear Med Background Indication for Stress Test:  Evaluation for Ischemia and Stent Patency History:  '04 ION:[id number], antero-septal and lateral ischemia, EF=65%; '13 NSTEMI>Stent x 2; 1/14 Cath:patent Stents with n/o CAD, EF=55-40%; 2/14 Echo:EF=65% Cardiac Risk Factors:Strong Family History - CAD, Hypertension and Lipids  Symptoms:  Chest Pressure.  (last episode of chest discomfort is now, 5/10, CP is constant with a lot of belching), DOE, Fatigue and Nausea   Nuclear Pre-Procedure Caffeine/Decaff Intake:  None > 12 hrs NPO After: 9:30pm   Lungs:  (R) lower rhonchi. O2 Sat: 96% on room air. IV 0.9% NS with Angio Cath:  22g  IV Site: R Forearm x 1, tolerated well IV Started by:  Irean Hong, RN  Chest Size (in):  42 Cup Size: C  Height: 5\' 8"  (1.727 m)  Weight:  222 lb (100.699 kg)  BMI:  Body mass index is 33.76 kg/(m^2). Tech Comments:  Toprol held x 24 hours;  Amlodipine taken this am    Nuclear Med Study 1 or 2 day study: 1 day  Stress Test Type:  Eugenie Birks  Reading MD: Kristeen Miss, MD  Order Authorizing Provider:  Peter Swaziland, MD, and Jacolyn Reedy, [facility]  Resting Radionuclide: Technetium 18m Sestamibi  Resting Radionuclide Dose: 10.7 mCi   Stress Radionuclide:  Technetium 20m Sestamibi  Stress Radionuclide Dose: 33.0 mCi           Stress Protocol Rest HR: 61 Stress HR: 130  Rest BP: 118/63 Stress BP: 173/66  Exercise Time (min): 2:00 METS: n/a   Predicted Max HR: 150 bpm % Max HR: 86.67 bpm Rate Pressure Product: 32440   Dose of Adenosine (mg):  n/a Dose of Lexiscan: 0.4 mg  Dose of Atropine (mg): n/a Dose of Dobutamine: n/a mcg/kg/min (at max HR)  Stress Test Technologist: Smiley Houseman, CMA-N  Nuclear Technologist:   Domenic Polite, CNMT     Rest Procedure:  Myocardial perfusion imaging was performed at rest 45 minutes following the intravenous administration of Technetium 3m Sestamibi.  Rest ECG: NSR - Normal EKG  Stress Procedure:  The patient received IV Lexiscan 0.4 mg over 15-seconds with concurrent low level exercise and then Technetium 105m Sestamibi was injected at 30-seconds while the patient continued walking one more minute.  She c/o nausea, lightheadedness, headache and had a lot of belching with Lexiscan.  Occasional PVC's were noted.  Quantitative spect images were obtained after a 45-minute delay.  Stress ECG: No significant change from baseline ECG  QPS Raw Data Images:  Normal; no motion artifact; normal heart/lung ratio. Stress Images:  There is a small, moderately severe area of attenuation in the apex.  Marland Kitchen   Rest Images:  There is a small area of very mild attenuation of the apex with normal uptake in the remaining LV.  Subtraction (SDS):  There is evidence of a small - medium sized area of mild ischemia.   SDS = 6  Transient Ischemic Dilatation (Normal <1.22):  n/a Lung/Heart Ratio (Normal <0.45):  0.46  Quantitative Gated Spect Images QGS EDV:  82 ml QGS ESV:  22 ml  Impression Exercise Capacity:  Lexiscan with low level exercise. BP Response:  Normal blood pressure response. Clinical Symptoms:  No  significant symptoms noted. ECG Impression:  No significant ST segment change suggestive of ischemia. Comparison with Prior Nuclear Study: No images to compare  Overall Impression:  Intermediate risk stress nuclear study .  There is evidence of moderate ischemia of the apex.  .  LV Ejection Fraction: 74%.  LV Wall Motion:  NL LV Function; NL Wall Motion.  The apex contracts vigorously.    Vesta Mixer, Montez Hageman., MD, [facility] 03/31/2013, 7:25 PM Office - [phone] Pager [phone]

## 2013-04-06 ENCOUNTER — Encounter: Payer: Self-pay | Admitting: Cardiology

## 2013-04-06 ENCOUNTER — Ambulatory Visit (INDEPENDENT_AMBULATORY_CARE_PROVIDER_SITE_OTHER): Payer: Medicare Other | Admitting: Cardiology

## 2013-04-06 VITALS — BP 150/91 | HR 78 | Wt 223.0 lb

## 2013-04-06 DIAGNOSIS — E785 Hyperlipidemia, unspecified: Secondary | ICD-10-CM | POA: Diagnosis not present

## 2013-04-06 DIAGNOSIS — J84112 Idiopathic pulmonary fibrosis: Secondary | ICD-10-CM | POA: Diagnosis not present

## 2013-04-06 DIAGNOSIS — R079 Chest pain, unspecified: Secondary | ICD-10-CM

## 2013-04-06 DIAGNOSIS — I2510 Atherosclerotic heart disease of native coronary artery without angina pectoris: Secondary | ICD-10-CM | POA: Diagnosis not present

## 2013-04-06 DIAGNOSIS — I10 Essential (primary) hypertension: Secondary | ICD-10-CM | POA: Diagnosis not present

## 2013-04-06 NOTE — Patient Instructions (Addendum)
Continue your current therapy  Let me know if your chest pain worsens.  I will see you in 2 months.  You may resume physical therapy.

## 2013-04-06 NOTE — Progress Notes (Signed)
 Jody Ruiz Date of Birth: 09/03/1940 Medical Record #[id number]  History of Present Illness: Jody Ruiz is seen today for followup after recent Myoview study. 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 She had a non-ST elevation myocardial infarction in October 2013. She had stenting of tandem lesions in the proximal and mid LAD with a 3.0 x 16 mm and 2.5 x 28 mm Promus stents respectively. In January this year she presented with some recurrent symptoms then repeat cardiac catheterization showed continued patency.  This year she has been diagnosed with idiopathic pulmonary fibrosis. She's also on CPAP therapy for obstructive sleep apnea. She has had recurrent upper respiratory infections and has had 3 courses of antibiotics this past month. Her PFTs showed a decline in her pulmonary function. A week ago Monday she developed symptoms of chest heaviness associated with significant belching. She states these symptoms were similar to what she had last October. Her symptoms were nonexertional. She was taking a lot of antacids. She was placed on Protonix 40 mg a day and isosorbide 30 mg daily. A followup Myoview was arranged. Since then her symptoms of chest discomfort have improved significantly. She has no active cough or fever. Current Outpatient Prescriptions on File Prior to Visit  Medication Sig Dispense Refill  . amLODipine (NORVASC) 5 MG tablet Take 1 tablet (5 mg total) by mouth daily.  30 tablet  3  . aspirin EC 81 MG tablet Take 81 mg by mouth daily.      . clopidogrel (PLAVIX) 75 MG tablet Take 1 tablet (75 mg total) by mouth daily.  30 tablet  6  . isosorbide mononitrate (IMDUR) 30 MG 24 hr tablet Take 1 tablet (30 mg total) by mouth daily.  30 tablet  6  . metoprolol succinate (TOPROL XL) 50 MG 24 hr tablet Take 1 tablet (50 mg total) by mouth daily. Take with or immediately following a meal.      . nitroGLYCERIN (NITROSTAT) 0.4 MG SL tablet Place 1  tablet (0.4 mg total) under the tongue every 5 (five) minutes x 3 doses as needed for chest pain.  25 tablet  3  . pantoprazole (PROTONIX) 40 MG tablet Take 1 tablet (40 mg total) by mouth daily.  30 tablet  5  . polyvinyl alcohol (LIQUIFILM TEARS) 1.4 % ophthalmic solution Place 1 drop into both eyes as needed. For dry eyes       No current facility-administered medications on file prior to visit.    Allergies  Allergen Reactions  . Brilinta [Ticagrelor] Shortness Of Breath and Cough    fatigue  . Lisinopril Shortness Of Breath and Cough  . Statins     Muscle Weakness  . Macrodantin [Nitrofurantoin Macrocrystal] Rash  . Sulfa Antibiotics Rash    Past Medical History  Diagnosis Date  . Anemia   . Hx of adenomatous colonic polyps   . CAD (coronary artery disease)     a. NSTEMI 10/13 => LHC 04/28/12:  pLAD 90%, mLAD 70-80% (long), pD1 70-80%, prox Int Br 30% (small), CFX 20%, OM1 40%, mOM2 30%, pRCA 30%, PDA 30-40%, pPLB 70%, then mid 90%, EF 65%.  PCI:  Promus DES x 2 to prox and mid LAD (3x16mm and 2.5x28mm);  b. 07/2012 Cath: LM nl, LAD patent stents, LCX 20, OM1 40p, OM2 30m, RCA 30pPDA 30-40, RPL 40p, 50m, EF 55-60% ->Cont Med Rx.  . Hypertension   . Microscopic colitis   . Arthritis   .   Obesity   . HLD (hyperlipidemia)     intolerant to statins  . Dyspnea     a. CP and SOB 06/2012 => Ticagrelor changed to Plavix  . Myocardial infarction 04/2012     non stemi  . Hx of echocardiogram     a. Echo 2/14:  mild LVH, EF 55-65%, Gr 1 diast dysfn, mild LAE  . OSA (obstructive sleep apnea)     on CPAP    Past Surgical History  Procedure Laterality Date  . Appendectomy    . Cholecystectomy    . Ventral hernia repair    . Breast biopsy      bilateral  . Knee arthroscopy      bilateral  . Vaginal polypectomy    . Shoulder arthroscopy  2012    left  . Cardiac catheterization      x4  . Coronary stent placement  04/2012    x2    History  Smoking status  . Never Smoker    Smokeless tobacco  . Never Used    History  Alcohol Use No    Family History  Problem Relation Age of Onset  . Breast cancer Sister   . Prostate cancer Father   . Colon cancer Sister   . Colon polyps Father   . Heart disease Mother   . Heart disease Father   . Heart disease Brother   . Heart disease Sister   . Kidney disease Father     Review of Systems: As noted in history of present illness.  All other systems were reviewed and are negative.  Physical Exam: BP 150/91  Pulse 78  Wt 223 lb (101.152 kg)  BMI 33.91 kg/m2 She is a pleasant white female in no acute distress. HEENT: Normal No JVD or bruits. Lungs:  Bibasilar crackles Cardiovascular: Regular rate and rhythm. Normal S1 and S2. No gallop, murmur, or click. Abdomen: Soft and nontender. No hepatosplenomegaly. Bowel sounds are positive. Extremities: No edema. Pulses are 2+ and symmetric. Skin: Warm and dry Neuro: Alert and oriented x3. Cranial nerves II through XII are intact. LABORATORY DATA:  Lab Results  Component Value Date   WBC 7.3 08/03/2012   HGB 12.8 08/03/2012   HCT 37.7 08/03/2012   PLT 273 08/03/2012   GLUCOSE 83 08/26/2012   CHOL 165 04/29/2012   TRIG 132 04/29/2012   HDL 43 04/29/2012   LDLCALC 96 04/29/2012   ALT 25 08/02/2012   AST 23 08/02/2012   NA 139 08/26/2012   K 4.3 08/26/2012   CL 103 08/26/2012   CREATININE 0.8 08/26/2012   BUN 18 08/26/2012   CO2 29 08/26/2012   INR 0.93 08/02/2012   Cardiology Nuclear Med Study  Jody Ruiz is a 72 y.o. female MRN : [id number] DOB: 06/02/1941  Procedure Date: 03/31/2013  Nuclear Med Background  Indication for Stress Test: Evaluation for Ischemia and Stent Patency  History: '04 MPS:anterior, antero-septal and lateral ischemia, EF=65%; '13 NSTEMI>Stent x 2; 1/14 Cath:patent Stents with n/o CAD, EF=55-40%; 2/14 Echo:EF=65%  Cardiac Risk Factors:Strong Family History - CAD, Hypertension and Lipids  Symptoms: Chest Pressure. (last episode of chest  discomfort is now, 5/10, CP is constant with a lot of belching), DOE, Fatigue and Nausea  Nuclear Pre-Procedure  Caffeine/Decaff Intake: None > 12 hrs  NPO After: 9:30pm   Lungs: (R) lower rhonchi.  O2 Sat: 96% on room air.  IV 0.9% NS with Angio Cath: 22g   IV Site: R Forearm x 1,   tolerated well  IV Started by: Patsy Edwards, RN   Chest Size (in): 42  Cup Size: C   Height: 5' 8" (1.727 m)  Weight: 222 lb (100.699 kg)   BMI: Body mass index is 33.76 kg/(m^2).  Tech Comments: Toprol held x 24 hours; Amlodipine taken this am   Nuclear Med Study  1 or 2 day study: 1 day  Stress Test Type: Lexiscan   Reading MD: Philip Nahser, MD  Order Authorizing Provider: Brennen Camper, MD, and Michele Lenze, PAC   Resting Radionuclide: Technetium 99m Sestamibi  Resting Radionuclide Dose: 10.7 mCi   Stress Radionuclide: Technetium 99m Sestamibi  Stress Radionuclide Dose: 33.0 mCi   Stress Protocol  Rest HR: 61  Stress HR: 130   Rest BP: 118/63  Stress BP: 173/66   Exercise Time (min): 2:00  METS: n/a   Predicted Max HR: 150 bpm  % Max HR: 86.67 bpm  Rate Pressure Product: 22490  Dose of Adenosine (mg): n/a  Dose of Lexiscan: 0.4 mg   Dose of Atropine (mg): n/a  Dose of Dobutamine: n/a mcg/kg/min (at max HR)   Stress Test Technologist: Sherri Ballard, CMA-N  Nuclear Technologist: Stephen Carbone, CNMT   Rest Procedure: Myocardial perfusion imaging was performed at rest 45 minutes following the intravenous administration of Technetium 99m Sestamibi.  Rest ECG: NSR - Normal EKG  Stress Procedure: The patient received IV Lexiscan 0.4 mg over 15-seconds with concurrent low level exercise and then Technetium 99m Sestamibi was injected at 30-seconds while the patient continued walking one more minute. She c/o nausea, lightheadedness, headache and had a lot of belching with Lexiscan. Occasional PVC's were noted. Quantitative spect images were obtained after a 45-minute delay.  Stress ECG: No significant change from  baseline ECG  QPS  Raw Data Images: Normal; no motion artifact; normal heart/lung ratio.  Stress Images: There is a small, moderately severe area of attenuation in the apex. .  Rest Images: There is a small area of very mild attenuation of the apex with normal uptake in the remaining LV.  Subtraction (SDS): There is evidence of a small - medium sized area of mild ischemia. SDS = 6  Transient Ischemic Dilatation (Normal <1.22): n/a  Lung/Heart Ratio (Normal <0.45): 0.46  Quantitative Gated Spect Images  QGS EDV: 82 ml  QGS ESV: 22 ml  Impression  Exercise Capacity: Lexiscan with low level exercise.  BP Response: Normal blood pressure response.  Clinical Symptoms: No significant symptoms noted.  ECG Impression: No significant ST segment change suggestive of ischemia.  Comparison with Prior Nuclear Study: No images to compare  Overall Impression: Intermediate risk stress nuclear study . There is evidence of moderate ischemia of the apex. .  LV Ejection Fraction: 74%. LV Wall Motion: NL LV Function; NL Wall Motion. The apex contracts vigorously.  Philip J. Nahser, Jr., MD, FACC  Assessment / Plan: 1. Coronary disease. Status post non-ST elevation myocardial infarction October 2013. Status post stenting of tandem lesions in the proximal and mid LAD. Repeat cardiac catheterization in January showed continued stent patency. Patient has developed recent symptoms of chest pain with atypical features. Clinically she is much improved on isosorbide and Protonix. Her Myoview study was reviewed. This demonstrates a small area of ischemia at the apex. It is noteworthy that the patient had a nuclear stress test in 2004 which described  anterior lateral, anteroseptal, and apical ischemia. Cardiac catheterization that time demonstrated only 50% disease in the mid LAD. She had both IVUS and   FFR performed which demonstrated no hemodynamically significant findings. Although her Myoview is abnormal this is not a  high risk study. Clinically she is doing better with treatment for both acid reflux and with nitrates. Our alternatives at this point are to continue medical therapy or to consider repeat cardiac catheterization. I discussed these options in detail with the patient and her husband. Given her clinical improvement I think it would be best to continue medical management at this point. If she were to develop worsening chest discomfort then cardiac catheterization would be warranted.  2. Dyslipidemia. She is intolerant to statins. Continue Zetia.  3. Idiopathic pulmonary fibrosis  4. Obstructive sleep apnea.  I'll followup again in 2 months.  

## 2013-04-08 ENCOUNTER — Encounter (HOSPITAL_COMMUNITY): Payer: Self-pay

## 2013-04-08 ENCOUNTER — Telehealth: Payer: Self-pay

## 2013-04-08 DIAGNOSIS — I2510 Atherosclerotic heart disease of native coronary artery without angina pectoris: Secondary | ICD-10-CM

## 2013-04-08 DIAGNOSIS — R079 Chest pain, unspecified: Secondary | ICD-10-CM

## 2013-04-08 NOTE — Telephone Encounter (Signed)
Patient called left cardiac cath scheduled with Dr.Jordan 04/12/13 at 10:30 am arrive at 8:30 am.Patient will have pre cath lab work and pick up instructions 04/11/13.

## 2013-04-10 IMAGING — CR DG CHEST 2V
2 series · 2 of 2 positions shown · non-contrast
Comparison: 06/09/2012

CLINICAL DATA: Cough

CHEST - 2 VIEW

[view not recorded (1 of 2)]
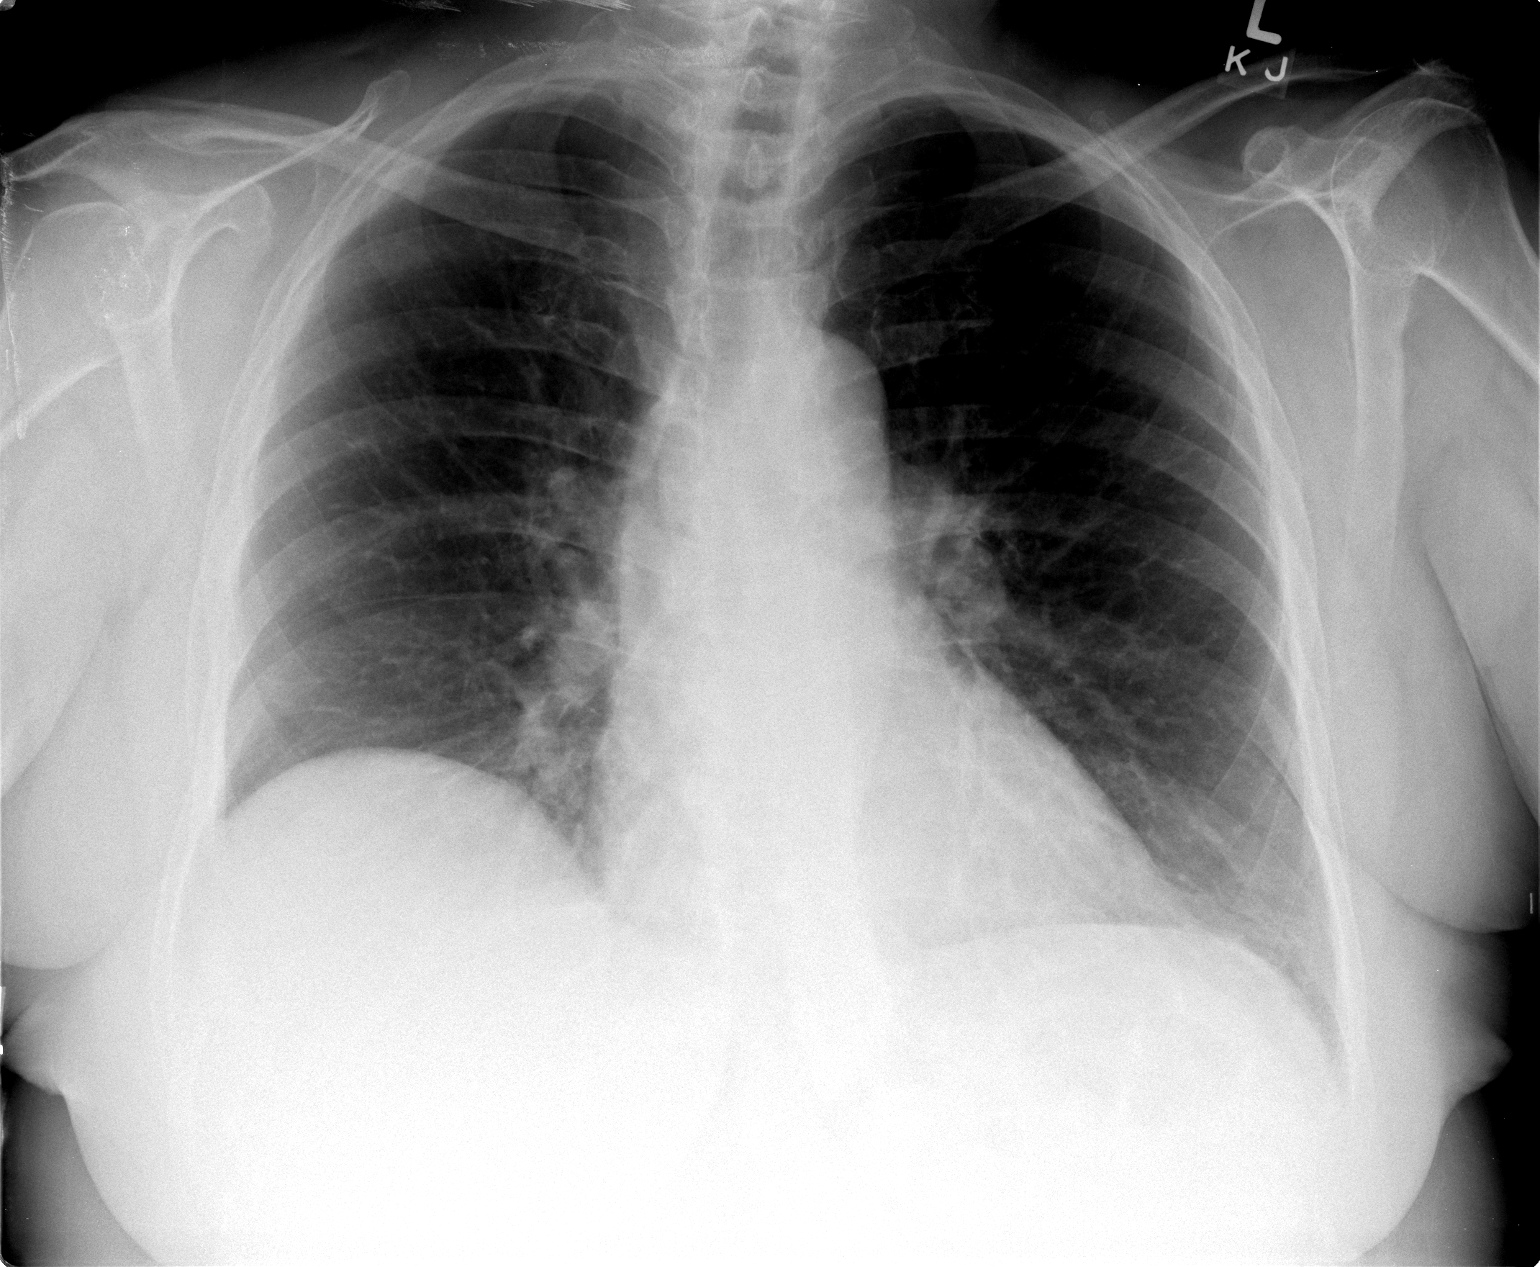

[view not recorded (2 of 2)]
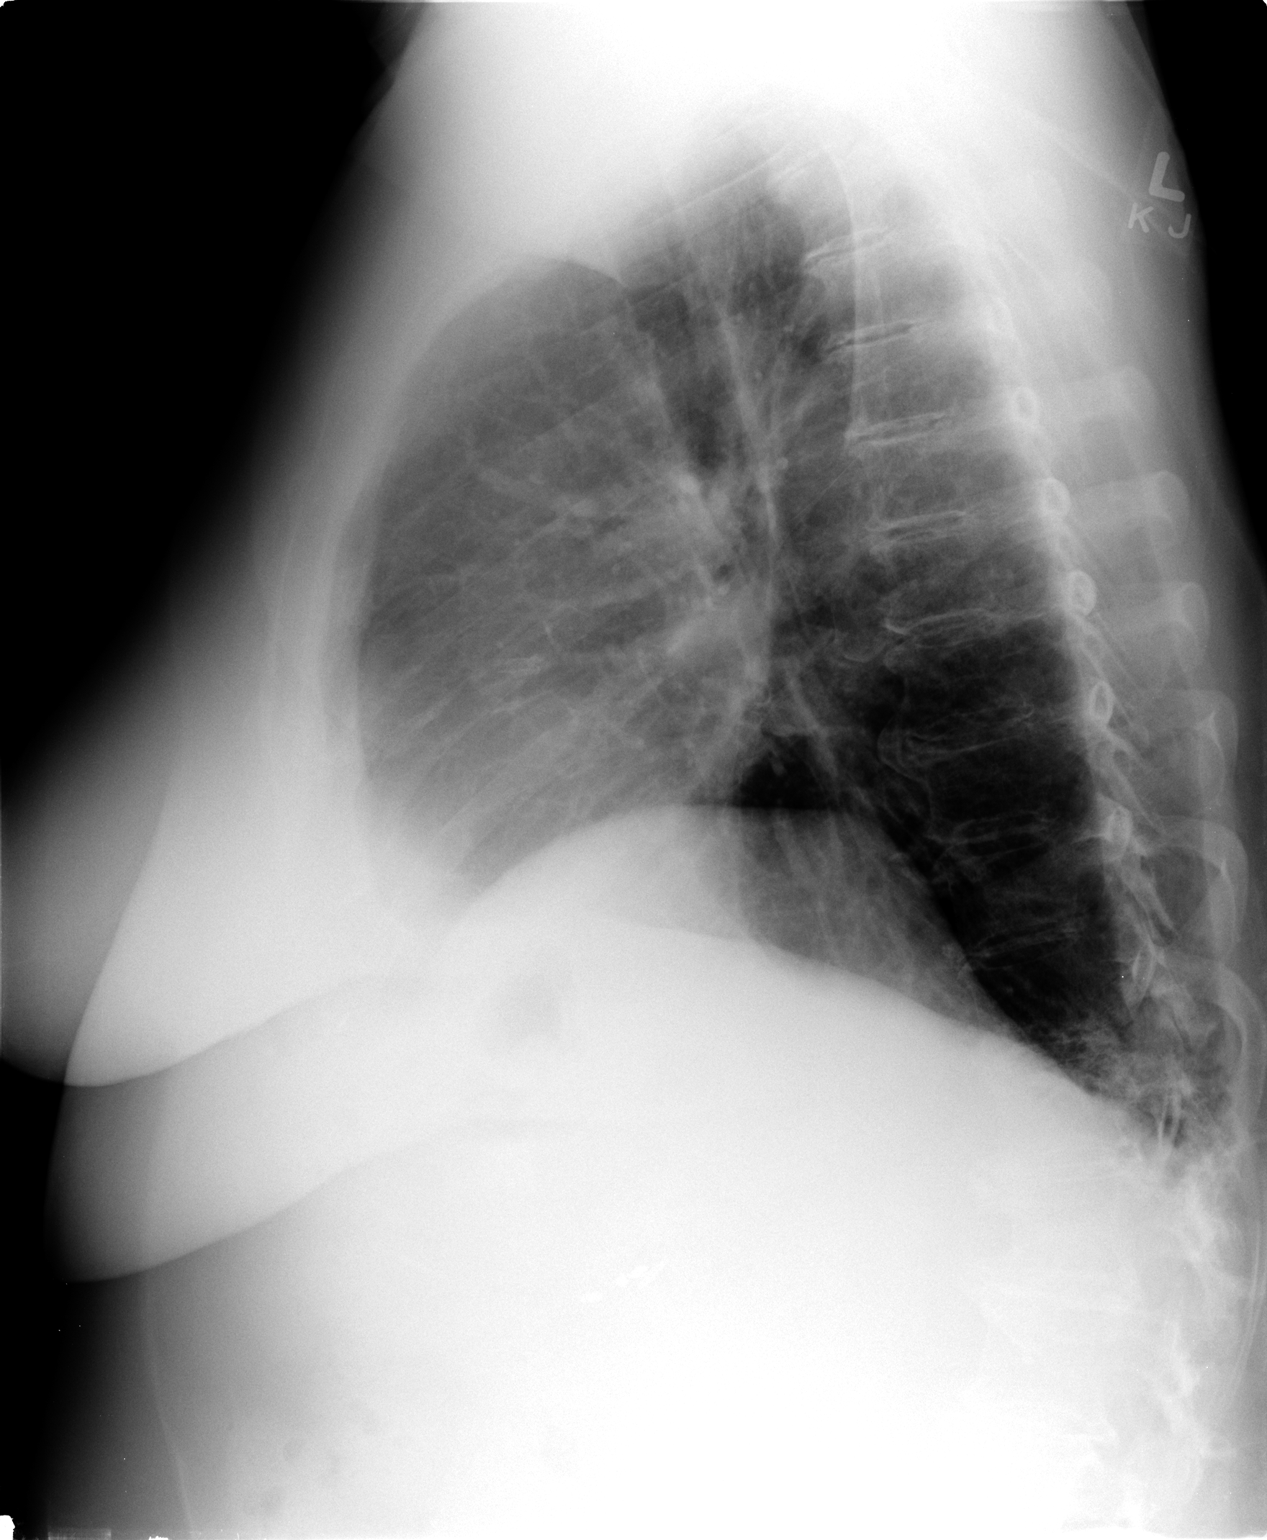

[2 of 2 positions shown; findings below may reference images not displayed]

FINDINGS: Negative for heart failure or edema.  Negative for
pneumonia.  Mild elevation right hemidiaphragm is unchanged.  No
mass lesion is present.
IMPRESSION: No acute abnormality.

## 2013-04-11 ENCOUNTER — Telehealth: Payer: Self-pay

## 2013-04-11 ENCOUNTER — Other Ambulatory Visit (INDEPENDENT_AMBULATORY_CARE_PROVIDER_SITE_OTHER): Payer: Medicare Other

## 2013-04-11 DIAGNOSIS — R079 Chest pain, unspecified: Secondary | ICD-10-CM

## 2013-04-11 DIAGNOSIS — I2510 Atherosclerotic heart disease of native coronary artery without angina pectoris: Secondary | ICD-10-CM | POA: Diagnosis not present

## 2013-04-11 LAB — CBC WITH DIFFERENTIAL/PLATELET
Basophils Absolute: 0 10*3/uL (ref 0.0–0.1)
Eosinophils Absolute: 0.2 10*3/uL (ref 0.0–0.7)
Lymphocytes Relative: 24.4 % (ref 12.0–46.0)
MCHC: 33.5 g/dL (ref 30.0–36.0)
Monocytes Relative: 6.7 % (ref 3.0–12.0)
Neutro Abs: 4.6 10*3/uL (ref 1.4–7.7)
Neutrophils Relative %: 65.2 % (ref 43.0–77.0)
Platelets: 329 10*3/uL (ref 150.0–400.0)
RDW: 14 % (ref 11.5–14.6)
WBC: 7.1 10*3/uL (ref 4.5–10.5)

## 2013-04-11 LAB — BASIC METABOLIC PANEL
BUN: 16 mg/dL (ref 6–23)
CO2: 27 mEq/L (ref 19–32)
Calcium: 9.1 mg/dL (ref 8.4–10.5)
Creatinine, Ser: 0.7 mg/dL (ref 0.4–1.2)
GFR: 86.4 mL/min (ref >60.00)
Glucose, Bld: 96 mg/dL (ref 70–99)

## 2013-04-11 LAB — PROTIME-INR: Prothrombin Time: 12.1 s (ref 10.2–12.4)

## 2013-04-11 NOTE — Telephone Encounter (Signed)
Patient came by office.Cath scheduled 04/12/13 10:30 am with Dr.Jordan,arrive at 8:30 am.Instructions given to patient she understood directions.Patient sent to lab for pre cath lab.

## 2013-04-12 ENCOUNTER — Ambulatory Visit (HOSPITAL_COMMUNITY)
Admission: RE | Admit: 2013-04-12 | Discharge: 2013-04-12 | Disposition: A | Discharge status: Home / Self Care | Payer: Medicare Other | Source: Ambulatory Visit | Attending: Cardiology | Admitting: Cardiology

## 2013-04-12 ENCOUNTER — Encounter (HOSPITAL_COMMUNITY)
Admission: RE | Disposition: A | Discharge status: Home / Self Care | Payer: Self-pay | Source: Ambulatory Visit | Attending: Cardiology

## 2013-04-12 ENCOUNTER — Other Ambulatory Visit: Payer: Self-pay | Admitting: Cardiology

## 2013-04-12 DIAGNOSIS — Z9861 Coronary angioplasty status: Secondary | ICD-10-CM | POA: Diagnosis not present

## 2013-04-12 DIAGNOSIS — I2510 Atherosclerotic heart disease of native coronary artery without angina pectoris: Secondary | ICD-10-CM | POA: Insufficient documentation

## 2013-04-12 DIAGNOSIS — R0789 Other chest pain: Secondary | ICD-10-CM | POA: Diagnosis not present

## 2013-04-12 HISTORY — PX: LEFT HEART CATHETERIZATION WITH CORONARY ANGIOGRAM: SHX5451

## 2013-04-12 SURGERY — LEFT HEART CATHETERIZATION WITH CORONARY ANGIOGRAM
Anesthesia: LOCAL

## 2013-04-12 MED ORDER — SODIUM CHLORIDE 0.9 % IV SOLN: 250.0000 mL | INTRAVENOUS | Status: DC | PRN

## 2013-04-12 MED ORDER — LIDOCAINE HCL (PF) 1 % IJ SOLN
INTRAMUSCULAR | Status: AC
  Filled 2013-04-12: qty 30

## 2013-04-12 MED ORDER — NITROGLYCERIN 0.2 MG/ML ON CALL CATH LAB
INTRAVENOUS | Status: AC
  Filled 2013-04-12: qty 1

## 2013-04-12 MED ORDER — FENTANYL CITRATE 0.05 MG/ML IJ SOLN
INTRAMUSCULAR | Status: AC
  Filled 2013-04-12: qty 2

## 2013-04-12 MED ORDER — ONDANSETRON HCL 4 MG/2ML IJ SOLN: 4.0000 mg | Freq: Four times a day (QID) | INTRAMUSCULAR | Status: DC | PRN

## 2013-04-12 MED ORDER — HEPARIN SODIUM (PORCINE) 1000 UNIT/ML IJ SOLN
INTRAMUSCULAR | Status: AC
  Filled 2013-04-12: qty 1

## 2013-04-12 MED ORDER — SODIUM CHLORIDE 0.9 % IJ SOLN: 3.0000 mL | Freq: Two times a day (BID) | INTRAMUSCULAR | Status: DC

## 2013-04-12 MED ORDER — VERAPAMIL HCL 2.5 MG/ML IV SOLN
INTRAVENOUS | Status: AC
  Filled 2013-04-12: qty 2

## 2013-04-12 MED ORDER — ASPIRIN 81 MG PO CHEW: 81.0000 mg | CHEWABLE_TABLET | ORAL | Status: DC

## 2013-04-12 MED ORDER — MIDAZOLAM HCL 2 MG/2ML IJ SOLN
INTRAMUSCULAR | Status: AC
  Filled 2013-04-12: qty 2

## 2013-04-12 MED ORDER — SODIUM CHLORIDE 0.9 % IV SOLN: 1.0000 mL/kg/h | INTRAVENOUS | Status: DC

## 2013-04-12 MED ORDER — SODIUM CHLORIDE 0.9 % IV SOLN: INTRAVENOUS | Status: DC

## 2013-04-12 MED ORDER — HEPARIN (PORCINE) IN NACL 2-0.9 UNIT/ML-% IJ SOLN
INTRAMUSCULAR | Status: AC
  Filled 2013-04-12: qty 1000

## 2013-04-12 MED ORDER — ACETAMINOPHEN 325 MG PO TABS: 650.0000 mg | ORAL_TABLET | ORAL | Status: DC | PRN

## 2013-04-12 MED ORDER — SODIUM CHLORIDE 0.9 % IJ SOLN: 3.0000 mL | INTRAMUSCULAR | Status: DC | PRN

## 2013-04-12 NOTE — Interval H&P Note (Signed)
History and Physical Interval Note:  04/12/2013 10:16 AM  Jody Ruiz  has presented today for surgery, with the diagnosis of cp/cad  The various methods of treatment have been discussed with the patient and family. After consideration of risks, benefits and other options for treatment, the patient has consented to  Procedure(s): LEFT HEART CATHETERIZATION WITH CORONARY ANGIOGRAM (N/A) as a surgical intervention .  The patient's history has been reviewed, patient examined, no change in status, stable for surgery.  I have reviewed the patient's chart and labs.  Questions were answered to the patient's satisfaction.   Cath Lab Visit (complete for each Cath Lab visit)  Clinical Evaluation Leading to the Procedure:   ACS: no  Non-ACS:    Anginal Classification: CCS III  Anti-ischemic medical therapy: Maximal Therapy (2 or more classes of medications)  Non-Invasive Test Results: Intermediate-risk stress test findings: cardiac mortality 1-3%/year  Prior CABG: No previous CABG        Jody Ruiz 04/12/2013 10:16 AM

## 2013-04-12 NOTE — CV Procedure (Signed)
    Cardiac Catheterization Procedure Note  Name: Jody Ruiz MRN: [id number] DOB: 1941-03-29  Procedure: Left Heart Cath, Selective Coronary Angiography, LV angiography  Indication: 72 yo WF with history of CAD s/p stenting of the proximal and mid LAD in October 2013 with DES presents with symptoms of atypical chest pain. Myoview study shows a small apical area of ischemia. She continues to have pain despite optimal medical therapy.   Procedural Details: The right wrist was prepped, draped, and anesthetized with 1% lidocaine. Using the modified Seldinger technique, a 5 French sheath was introduced into the right radial artery. 3 mg of verapamil was administered through the sheath, weight-based unfractionated heparin was administered intravenously. Standard Judkins catheters were used for selective coronary angiography and left ventriculography. Catheter exchanges were performed over an exchange length guidewire. There were no immediate procedural complications. A TR band was used for radial hemostasis at the completion of the procedure.  The patient was transferred to the post catheterization recovery area for further monitoring.  Procedural Findings: Hemodynamics: AO 171/74 mean 117 mmHg LV 176/21 mm Hg  Coronary angiography: Coronary dominance: right  Left mainstem: Normal   Left anterior descending (LAD): the stents in the proximal and mid LAD are widely patent. There is 30% disease distally.  Left circumflex (LCx): Diffuse irregularities less than 20%..  Right coronary artery (RCA): 40-50% mid PDA. Otherwise no significant disease.  Left ventriculography: Left ventricular systolic function is normal, LVEF is estimated at 55-65%, there is no significant mitral regurgitation   Final Conclusions:   1. Nonobstructive CAD, patent LAD stents. 2. Normal LV function.  Recommendations: Medical management. Consider GI source of chest pain.  Theron Arista [facility] 04/12/2013, 10:52  AM

## 2013-04-12 NOTE — H&P (View-Only) (Signed)
Jody Ruiz Date of Birth: 1940-09-25 Medical Record #[id number]  History of Present Illness: Jody Ruiz is seen today for followup after recent Myoview study. 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 She had a non-ST elevation myocardial infarction in October 2013. She had stenting of tandem lesions in the proximal and mid LAD with a 3.0 x 16 mm and 2.5 x 28 mm Promus stents respectively. In January this year she presented with some recurrent symptoms then repeat cardiac catheterization showed continued patency.  This year she has been diagnosed with idiopathic pulmonary fibrosis. She's also on CPAP therapy for obstructive sleep apnea. She has had recurrent upper respiratory infections and has had 3 courses of antibiotics this past month. Her PFTs showed a decline in her pulmonary function. A week ago Monday she developed symptoms of chest heaviness associated with significant belching. She states these symptoms were similar to what she had last October. Her symptoms were nonexertional. She was taking a lot of antacids. She was placed on Protonix 40 mg a day and isosorbide 30 mg daily. A followup Myoview was arranged. Since then her symptoms of chest discomfort have improved significantly. She has no active cough or fever. Current Outpatient Prescriptions on File Prior to Visit  Medication Sig Dispense Refill  . amLODipine (NORVASC) 5 MG tablet Take 1 tablet (5 mg total) by mouth daily.  30 tablet  3  . aspirin EC 81 MG tablet Take 81 mg by mouth daily.      . clopidogrel (PLAVIX) 75 MG tablet Take 1 tablet (75 mg total) by mouth daily.  30 tablet  6  . isosorbide mononitrate (IMDUR) 30 MG 24 hr tablet Take 1 tablet (30 mg total) by mouth daily.  30 tablet  6  . metoprolol succinate (TOPROL XL) 50 MG 24 hr tablet Take 1 tablet (50 mg total) by mouth daily. Take with or immediately following a meal.      . nitroGLYCERIN (NITROSTAT) 0.4 MG SL tablet Place 1  tablet (0.4 mg total) under the tongue every 5 (five) minutes x 3 doses as needed for chest pain.  25 tablet  3  . pantoprazole (PROTONIX) 40 MG tablet Take 1 tablet (40 mg total) by mouth daily.  30 tablet  5  . polyvinyl alcohol (LIQUIFILM TEARS) 1.4 % ophthalmic solution Place 1 drop into both eyes as needed. For dry eyes       No current facility-administered medications on file prior to visit.    Allergies  Allergen Reactions  . Brilinta [Ticagrelor] Shortness Of Breath and Cough    fatigue  . Lisinopril Shortness Of Breath and Cough  . Statins     Muscle Weakness  . Macrodantin [Nitrofurantoin Macrocrystal] Rash  . Sulfa Antibiotics Rash    Past Medical History  Diagnosis Date  . Anemia   . Hx of adenomatous colonic polyps   . CAD (coronary artery disease)     a. NSTEMI 10/13 => LHC 04/28/12:  pLAD 90%, mLAD 70-80% (long), pD1 70-80%, prox Int Br 30% (small), CFX 20%, OM1 40%, mOM2 30%, pRCA 30%, PDA 30-40%, pPLB 70%, then mid 90%, EF 65%.  PCI:  Promus DES x 2 to prox and mid LAD (3x94mm and 2.5x40mm);  b. 07/2012 Cath: LM nl, LAD patent stents, LCX 20, OM1 40p, OM2 79m, RCA 30pPDA 30-40, RPL 40p, 60m, EF 55-60% ->Cont Med Rx.  Marland Kitchen Hypertension   . Microscopic colitis   . Arthritis   .  Obesity   . HLD (hyperlipidemia)     intolerant to statins  . Dyspnea     a. CP and SOB 06/2012 => Ticagrelor changed to Plavix  . Myocardial infarction 04/2012     non stemi  . Hx of echocardiogram     a. Echo 2/14:  mild LVH, EF 55-65%, Gr 1 diast dysfn, mild LAE  . OSA (obstructive sleep apnea)     on CPAP    Past Surgical History  Procedure Laterality Date  . Appendectomy    . Cholecystectomy    . Ventral hernia repair    . Breast biopsy      bilateral  . Knee arthroscopy      bilateral  . Vaginal polypectomy    . Shoulder arthroscopy  2012    left  . Cardiac catheterization      x4  . Coronary stent placement  04/2012    x2    History  Smoking status  . Never Smoker    Smokeless tobacco  . Never Used    History  Alcohol Use No    Family History  Problem Relation Age of Onset  . Breast cancer Sister   . Prostate cancer Father   . Colon cancer Sister   . Colon polyps Father   . Heart disease Mother   . Heart disease Father   . Heart disease Brother   . Heart disease Sister   . Kidney disease Father     Review of Systems: As noted in history of present illness.  All other systems were reviewed and are negative.  Physical Exam: BP 150/91  Pulse 78  Wt 223 lb (101.152 kg)  BMI 33.91 kg/m2 She is a pleasant white female in no acute distress. HEENT: Normal No JVD or bruits. Lungs:  Bibasilar crackles Cardiovascular: Regular rate and rhythm. Normal S1 and S2. No gallop, murmur, or click. Abdomen: Soft and nontender. No hepatosplenomegaly. Bowel sounds are positive. Extremities: No edema. Pulses are 2+ and symmetric. Skin: Warm and dry Neuro: Alert and oriented x3. Cranial nerves II through XII are intact. LABORATORY DATA:  Lab Results  Component Value Date   WBC 7.3 08/03/2012   HGB 12.8 08/03/2012   HCT 37.7 08/03/2012   PLT 273 08/03/2012   GLUCOSE 83 08/26/2012   CHOL 165 04/29/2012   TRIG 132 04/29/2012   HDL 43 04/29/2012   LDLCALC 96 04/29/2012   ALT 25 08/02/2012   AST 23 08/02/2012   NA 139 08/26/2012   K 4.3 08/26/2012   CL 103 08/26/2012   CREATININE 0.8 08/26/2012   BUN 18 08/26/2012   CO2 29 08/26/2012   INR 0.93 08/02/2012   Cardiology Nuclear Med Study  Jody Ruiz is a 72 y.o. female MRN : [id number] DOB: [birth year]  Procedure Date: 03/31/2013  Nuclear Med Background  Indication for Stress Test: Evaluation for Ischemia and Stent Patency  History: '04 WUJ:[id number], antero-septal and lateral ischemia, EF=65%; '13 NSTEMI>Stent x 2; 1/14 Cath:patent Stents with n/o CAD, EF=55-40%; 2/14 Echo:EF=65%  Cardiac Risk Factors:Strong Family History - CAD, Hypertension and Lipids  Symptoms: Chest Pressure. (last episode of chest  discomfort is now, 5/10, CP is constant with a lot of belching), DOE, Fatigue and Nausea  Nuclear Pre-Procedure  Caffeine/Decaff Intake: None > 12 hrs  NPO After: 9:30pm   Lungs: (R) lower rhonchi.  O2 Sat: 96% on room air.  IV 0.9% NS with Angio Cath: 22g   IV Site: R Forearm x 1,  tolerated well  IV Started by: Irean Hong, RN   Chest Size (in): 42  Cup Size: C   Height: 5\' 8"  (1.727 m)  Weight: 222 lb (100.699 kg)   BMI: Body mass index is 33.76 kg/(m^2).  Tech Comments: Toprol held x 24 hours; Amlodipine taken this am   Nuclear Med Study  1 or 2 day study: 1 day  Stress Test Type: Eugenie Birks   Reading MD: Kristeen Miss, MD  Order Authorizing Provider: Kieren Adkison [place], MD, and Jacolyn Reedy, [facility]   Resting Radionuclide: Technetium 74m Sestamibi  Resting Radionuclide Dose: 10.7 mCi   Stress Radionuclide: Technetium 51m Sestamibi  Stress Radionuclide Dose: 33.0 mCi   Stress Protocol  Rest HR: 61  Stress HR: 130   Rest BP: 118/63  Stress BP: 173/66   Exercise Time (min): 2:00  METS: n/a   Predicted Max HR: 150 bpm  % Max HR: 86.67 bpm  Rate Pressure Product: 14782  Dose of Adenosine (mg): n/a  Dose of Lexiscan: 0.4 mg   Dose of Atropine (mg): n/a  Dose of Dobutamine: n/a mcg/kg/min (at max HR)   Stress Test Technologist: Smiley Houseman, CMA-N  Nuclear Technologist: Domenic Polite, CNMT   Rest Procedure: Myocardial perfusion imaging was performed at rest 45 minutes following the intravenous administration of Technetium 19m Sestamibi.  Rest ECG: NSR - Normal EKG  Stress Procedure: The patient received IV Lexiscan 0.4 mg over 15-seconds with concurrent low level exercise and then Technetium 54m Sestamibi was injected at 30-seconds while the patient continued walking one more minute. She c/o nausea, lightheadedness, headache and had a lot of belching with Lexiscan. Occasional PVC's were noted. Quantitative spect images were obtained after a 45-minute delay.  Stress ECG: No significant change from  baseline ECG  QPS  Raw Data Images: Normal; no motion artifact; normal heart/lung ratio.  Stress Images: There is a small, moderately severe area of attenuation in the apex. Marland Kitchen  Rest Images: There is a small area of very mild attenuation of the apex with normal uptake in the remaining LV.  Subtraction (SDS): There is evidence of a small - medium sized area of mild ischemia. SDS = 6  Transient Ischemic Dilatation (Normal <1.22): n/a  Lung/Heart Ratio (Normal <0.45): 0.46  Quantitative Gated Spect Images  QGS EDV: 82 ml  QGS ESV: 22 ml  Impression  Exercise Capacity: Lexiscan with low level exercise.  BP Response: Normal blood pressure response.  Clinical Symptoms: No significant symptoms noted.  ECG Impression: No significant ST segment change suggestive of ischemia.  Comparison with Prior Nuclear Study: No images to compare  Overall Impression: Intermediate risk stress nuclear study . There is evidence of moderate ischemia of the apex. .  LV Ejection Fraction: 74%. LV Wall Motion: NL LV Function; NL Wall Motion. The apex contracts vigorously.  Alvia Grove., MD, [facility]  Assessment / Plan: 1. Coronary disease. Status post non-ST elevation myocardial infarction October 2013. Status post stenting of tandem lesions in the proximal and mid LAD. Repeat cardiac catheterization in January showed continued stent patency. Patient has developed recent symptoms of chest pain with atypical features. Clinically she is much improved on isosorbide and Protonix. Her Myoview study was reviewed. This demonstrates a small area of ischemia at the apex. It is noteworthy that the patient had a nuclear stress test in 2004 which described  anterior lateral, anteroseptal, and apical ischemia. Cardiac catheterization that time demonstrated only 50% disease in the mid LAD. She had both IVUS and  FFR performed which demonstrated no hemodynamically significant findings. Although her Myoview is abnormal this is not a  high risk study. Clinically she is doing better with treatment for both acid reflux and with nitrates. Our alternatives at this point are to continue medical therapy or to consider repeat cardiac catheterization. I discussed these options in detail with the patient and her husband. Given her clinical improvement I think it would be best to continue medical management at this point. If she were to develop worsening chest discomfort then cardiac catheterization would be warranted.  2. Dyslipidemia. She is intolerant to statins. Continue Zetia.  3. Idiopathic pulmonary fibrosis  4. Obstructive sleep apnea.  I'll followup again in 2 months.

## 2013-04-18 DIAGNOSIS — M7530 Calcific tendinitis of unspecified shoulder: Secondary | ICD-10-CM | POA: Diagnosis not present

## 2013-04-18 DIAGNOSIS — M7500 Adhesive capsulitis of unspecified shoulder: Secondary | ICD-10-CM | POA: Diagnosis not present

## 2013-04-20 DIAGNOSIS — M7530 Calcific tendinitis of unspecified shoulder: Secondary | ICD-10-CM | POA: Diagnosis not present

## 2013-04-20 DIAGNOSIS — M7500 Adhesive capsulitis of unspecified shoulder: Secondary | ICD-10-CM | POA: Diagnosis not present

## 2013-04-20 DIAGNOSIS — M25519 Pain in unspecified shoulder: Secondary | ICD-10-CM | POA: Diagnosis not present

## 2013-04-22 ENCOUNTER — Encounter: Payer: Self-pay | Admitting: Internal Medicine

## 2013-04-22 NOTE — Telephone Encounter (Signed)
Error

## 2013-04-25 DIAGNOSIS — M25669 Stiffness of unspecified knee, not elsewhere classified: Secondary | ICD-10-CM | POA: Diagnosis not present

## 2013-04-25 DIAGNOSIS — M7530 Calcific tendinitis of unspecified shoulder: Secondary | ICD-10-CM | POA: Diagnosis not present

## 2013-04-25 DIAGNOSIS — M25519 Pain in unspecified shoulder: Secondary | ICD-10-CM | POA: Diagnosis not present

## 2013-04-27 ENCOUNTER — Ambulatory Visit (INDEPENDENT_AMBULATORY_CARE_PROVIDER_SITE_OTHER): Payer: Medicare Other | Admitting: Cardiology

## 2013-04-27 ENCOUNTER — Encounter: Payer: Self-pay | Admitting: Cardiology

## 2013-04-27 VITALS — BP 142/68 | HR 70 | Ht 68.0 in | Wt 220.8 lb

## 2013-04-27 DIAGNOSIS — I2510 Atherosclerotic heart disease of native coronary artery without angina pectoris: Secondary | ICD-10-CM | POA: Diagnosis not present

## 2013-04-27 DIAGNOSIS — M25669 Stiffness of unspecified knee, not elsewhere classified: Secondary | ICD-10-CM | POA: Diagnosis not present

## 2013-04-27 DIAGNOSIS — Z0000 Encounter for general adult medical examination without abnormal findings: Secondary | ICD-10-CM | POA: Diagnosis not present

## 2013-04-27 DIAGNOSIS — I10 Essential (primary) hypertension: Secondary | ICD-10-CM

## 2013-04-27 DIAGNOSIS — R079 Chest pain, unspecified: Secondary | ICD-10-CM

## 2013-04-27 DIAGNOSIS — Z23 Encounter for immunization: Secondary | ICD-10-CM

## 2013-04-27 DIAGNOSIS — E785 Hyperlipidemia, unspecified: Secondary | ICD-10-CM

## 2013-04-27 DIAGNOSIS — M25519 Pain in unspecified shoulder: Secondary | ICD-10-CM | POA: Diagnosis not present

## 2013-04-27 DIAGNOSIS — M7530 Calcific tendinitis of unspecified shoulder: Secondary | ICD-10-CM | POA: Diagnosis not present

## 2013-04-27 NOTE — Patient Instructions (Signed)
You can stop Plavix   Continue your other therapy  I will see you in 6 months.

## 2013-04-27 NOTE — Progress Notes (Signed)
Jody Ruiz Date of Birth: August 13, 1940 Medical Record #[id number]  History of Present Illness: Jody Ruiz is seen today for followup.She had a non-ST elevation myocardial infarction in October 2013. She had stenting of tandem lesions in the proximal and mid LAD with a 3.0 x 16 mm and 2.5 x 28 mm Promus stents respectively. In January this year she presented with some recurrent symptoms then repeat cardiac catheterization showed continued patency. Recently she had a Myoview study was suggested some apical ischemia. Repeat cardiac catheterization continued to show stent patency. She was started on protonix and isosorbide empirically. She states that her symptoms of chest pain have resolved completely. She still complains of some belching. She is scheduled to see Jody Ruiz with GI next week. This year she has been diagnosed with idiopathic pulmonary fibrosis. She's also on CPAP therapy for obstructive sleep apnea. She states that Jody Ruiz had reduced her metoprolol dose from 100>>50 mg per day. He thinks he wanted to try to get her off of this medication.  Current Outpatient Prescriptions on File Prior to Visit  Medication Sig Dispense Refill  . acetaminophen (TYLENOL) 500 MG tablet Take 1,000 mg by mouth daily as needed for pain.      Marland Kitchen amLODipine (NORVASC) 5 MG tablet Take 1 tablet (5 mg total) by mouth daily.  30 tablet  3  . aspirin EC 81 MG tablet Take 81 mg by mouth daily.      . clopidogrel (PLAVIX) 75 MG tablet Take 1 tablet (75 mg total) by mouth daily.  30 tablet  6  . isosorbide mononitrate (IMDUR) 30 MG 24 hr tablet Take 30 mg by mouth at bedtime.      . metoprolol succinate (TOPROL XL) 50 MG 24 hr tablet Take 1 tablet (50 mg total) by mouth daily. Take with or immediately following a meal.      . nitroGLYCERIN (NITROSTAT) 0.4 MG SL tablet Place 1 tablet (0.4 mg total) under the tongue every 5 (five) minutes x 3 doses as needed for chest pain.  25 tablet  3  . pantoprazole  (PROTONIX) 40 MG tablet Take 1 tablet (40 mg total) by mouth daily.  30 tablet  5  . polyvinyl alcohol (LIQUIFILM TEARS) 1.4 % ophthalmic solution Place 1 drop into both eyes as needed. For dry eyes       No current facility-administered medications on file prior to visit.    Allergies  Allergen Reactions  . Brilinta [Ticagrelor] Shortness Of Breath and Cough    fatigue  . Lisinopril Shortness Of Breath and Cough  . Statins     Muscle Weakness  . Macrodantin [Nitrofurantoin Macrocrystal] Rash  . Sulfa Antibiotics Rash    Past Medical History  Diagnosis Date  . Anemia   . Hx of adenomatous colonic polyps   . CAD (coronary artery disease)     a. NSTEMI 10/13 => LHC 04/28/12:  pLAD 90%, mLAD 70-80% (long), pD1 70-80%, prox Int Br 30% (small), CFX 20%, OM1 40%, mOM2 30%, pRCA 30%, PDA 30-40%, pPLB 70%, then mid 90%, EF 65%.  PCI:  Promus DES x 2 to prox and mid LAD (3x57mm and 2.5x65mm);  b. 07/2012 Cath: LM nl, LAD patent stents, LCX 20, OM1 40p, OM2 29m, RCA 30pPDA 30-40, RPL 40p, 68m, EF 55-60% ->Cont Med Rx.  Marland Kitchen Hypertension   . Microscopic colitis   . Arthritis   . Obesity   . HLD (hyperlipidemia)     intolerant to statins  .  Dyspnea     a. CP and SOB 06/2012 => Ticagrelor changed to Plavix  . Myocardial infarction 04/2012     non stemi  . Hx of echocardiogram     a. Echo 2/14:  mild LVH, EF 55-65%, Gr 1 diast dysfn, mild LAE  . OSA (obstructive sleep apnea)     on CPAP  . Internal hemorrhoids     Past Surgical History  Procedure Laterality Date  . Appendectomy    . Cholecystectomy    . Ventral hernia repair    . Breast biopsy      bilateral  . Knee arthroscopy      bilateral  . Vaginal polypectomy    . Shoulder arthroscopy  2012    left  . Cardiac catheterization      x4  . Coronary stent placement  04/2012    x2    History  Smoking status  . Never Smoker   Smokeless tobacco  . Never Used    History  Alcohol Use No    Family History  Problem  Relation Age of Onset  . Breast cancer Sister   . Prostate cancer Father   . Colon cancer Sister   . Colon polyps Father   . Heart disease Mother   . Heart disease Father   . Heart disease Brother   . Heart disease Sister   . Kidney disease Father     Review of Systems: As noted in history of present illness.  All other systems were reviewed and are negative.  Physical Exam: BP 142/68  Pulse 70  Ht 5\' 8"  (1.727 m)  Wt 220 lb 12.8 oz (100.154 kg)  BMI 33.58 kg/m2  SpO2 98% She is a pleasant white female in no acute distress. HEENT: Normal No JVD or bruits. Lungs:  Bibasilar crackles Cardiovascular: Regular rate and rhythm. Normal S1 and S2. No gallop, murmur, or click. Abdomen: Soft and nontender. No hepatosplenomegaly. Bowel sounds are positive. Extremities: No edema. Pulses are 2+ and symmetric. Skin: Warm and dry Neuro: Alert and oriented x3. Cranial nerves II through XII are intact. LABORATORY DATA:  Lab Results  Component Value Date   WBC 7.1 04/11/2013   HGB 12.4 04/11/2013   HCT 37.0 04/11/2013   PLT 329.0 04/11/2013   GLUCOSE 96 04/11/2013   CHOL 165 04/29/2012   TRIG 132 04/29/2012   HDL 43 04/29/2012   LDLCALC 96 04/29/2012   ALT 25 08/02/2012   AST 23 08/02/2012   NA 137 04/11/2013   K 3.6 04/11/2013   CL 105 04/11/2013   CREATININE 0.7 04/11/2013   BUN 16 04/11/2013   CO2 27 04/11/2013   INR 1.1* 04/11/2013    Assessment / Plan: 1. Coronary disease. Status post non-ST elevation myocardial infarction October 2013. Status post stenting of tandem lesions in the proximal and mid LAD. Repeat cardiac catheterization in January and October of this year showed continued stent patency. Patient has developed recent symptoms of chest pain with atypical features. Clinically she is much improved on isosorbide and Protonix. I suspect her symptoms were more GI related especially with her continued belching. We'll await GI evaluation. We will continue her current anti-anginal  therapy. She may stop Plavix at this point since she is a year out from her stent. Given her history of previous myocardial infarction now recommend she stay on beta blocker therapy. She does not have obstructive lung disease.  2. Dyslipidemia. She is intolerant to statins. Continue Zetia.  3. Idiopathic  pulmonary fibrosis  4. Obstructive sleep apnea.  I'll followup again in 6 months.

## 2013-04-29 ENCOUNTER — Encounter: Payer: Self-pay | Admitting: Internal Medicine

## 2013-04-29 ENCOUNTER — Ambulatory Visit (INDEPENDENT_AMBULATORY_CARE_PROVIDER_SITE_OTHER): Payer: Medicare Other | Admitting: Internal Medicine

## 2013-04-29 VITALS — BP 134/72 | HR 80 | Ht 65.25 in | Wt 221.5 lb

## 2013-04-29 DIAGNOSIS — M25519 Pain in unspecified shoulder: Secondary | ICD-10-CM | POA: Diagnosis not present

## 2013-04-29 DIAGNOSIS — R197 Diarrhea, unspecified: Secondary | ICD-10-CM | POA: Diagnosis not present

## 2013-04-29 DIAGNOSIS — R1031 Right lower quadrant pain: Secondary | ICD-10-CM | POA: Diagnosis not present

## 2013-04-29 DIAGNOSIS — M25669 Stiffness of unspecified knee, not elsewhere classified: Secondary | ICD-10-CM | POA: Diagnosis not present

## 2013-04-29 DIAGNOSIS — K52832 Lymphocytic colitis: Secondary | ICD-10-CM

## 2013-04-29 DIAGNOSIS — K5289 Other specified noninfective gastroenteritis and colitis: Secondary | ICD-10-CM | POA: Diagnosis not present

## 2013-04-29 DIAGNOSIS — M7530 Calcific tendinitis of unspecified shoulder: Secondary | ICD-10-CM | POA: Diagnosis not present

## 2013-04-29 MED ORDER — METRONIDAZOLE 250 MG PO TABS: 250.0000 mg | ORAL_TABLET | Freq: Four times a day (QID) | ORAL | Status: DC

## 2013-04-29 NOTE — Progress Notes (Signed)
HISTORY OF PRESENT ILLNESS:  Jody Ruiz is a 72 y.o. female with multiple significant medical problems including hypertension, coronary artery disease status post myocardial infarction and coronary artery stenting, morbid obesity, osteoarthritis, adenomatous colon polyps, microscopic colitis (previously under the care of Dr. Dorena Cookey for greater than 1 decade), sleep apnea with CPAP intermittently, and recently diagnosed pulmonary fibrosis. She was seen in February regarding a symptomatic Hemoccult-positive stool. See that dictation. No GI interventions deemed appropriate to do her other medical issues. She has had recent pulmonary cardiology office visits which have been reviewed. Recently with atypical chest pain for which she underwent cardiac catheterization. Not felt to be cardiac in origin. Empirically placed on PPI. Somewhat better. She does complain of excessive belching. No additional upper GI complaints. She also mentions problems with loose stools and incontinence over the past month. This is reminiscent of problems she had earlier in the year that responded to metronidazole therapy. She has had multiple courses of antibiotics since August. Finally, she mentions new onset right lower quadrant discomfort over the past few days. Is described as sharp and lasts less than 5 minutes. No other associated features. Over an area of prior incisional hernia repair. She is covered by husband.  REVIEW OF SYSTEMS:  All non-GI ROS negative except for headaches, shortness of breath, fever, cough, sleeping problems  Past Medical History  Diagnosis Date  . Anemia   . Hx of adenomatous colonic polyps   . CAD (coronary artery disease)     a. NSTEMI 10/13 => LHC 04/28/12:  pLAD 90%, mLAD 70-80% (long), pD1 70-80%, prox Int Br 30% (small), CFX 20%, OM1 40%, mOM2 30%, pRCA 30%, PDA 30-40%, pPLB 70%, then mid 90%, EF 65%.  PCI:  Promus DES x 2 to prox and mid LAD (3x61mm and 2.5x58mm);  b. 07/2012 Cath: LM  nl, LAD patent stents, LCX 20, OM1 40p, OM2 12m, RCA 30pPDA 30-40, RPL 40p, 58m, EF 55-60% ->Cont Med Rx.  Marland Kitchen Hypertension   . Microscopic colitis   . Arthritis   . Obesity   . HLD (hyperlipidemia)     intolerant to statins  . Dyspnea     a. CP and SOB 06/2012 => Ticagrelor changed to Plavix  . Myocardial infarction 04/2012     non stemi  . Hx of echocardiogram     a. Echo 2/14:  mild LVH, EF 55-65%, Gr 1 diast dysfn, mild LAE  . OSA (obstructive sleep apnea)     on CPAP  . Internal hemorrhoids   . Idiopathic pulmonary fibrosis   . Lung nodules     Past Surgical History  Procedure Laterality Date  . Appendectomy    . Cholecystectomy    . Ventral hernia repair    . Breast biopsy      bilateral  . Knee arthroscopy      bilateral  . Vaginal polypectomy    . Shoulder arthroscopy  2012    left  . Cardiac catheterization      x4  . Coronary stent placement  04/2012    x2    Social History BASHA KRYGIER  reports that she has never smoked. She has never used smokeless tobacco. She reports that she does not drink alcohol or use illicit drugs.  family history includes Breast cancer in her sister; Colon cancer in her sister; Colon polyps in her father; Heart disease in her brother, father, mother, and sister; Kidney disease in her father; Prostate cancer in her father.  Allergies  Allergen Reactions  . Brilinta [Ticagrelor] Shortness Of Breath and Cough    fatigue  . Lisinopril Shortness Of Breath and Cough  . Statins     Muscle Weakness  . Macrodantin [Nitrofurantoin Macrocrystal] Rash  . Sulfa Antibiotics Rash       PHYSICAL EXAMINATION: Vital signs: BP 134/72  Pulse 80  Ht 5' 5.25" (1.657 m)  Wt 221 lb 8 oz (100.472 kg)  BMI 36.59 kg/m2 General: Well-developed, obese, well-nourished, no acute distress HEENT: Sclerae are anicteric, conjunctiva pink. Oral mucosa intact Lungs: Clear Heart: Regular Abdomen: soft, obese, mild tenderness in right lower quadrant  over previous incision, no hernia, nondistended, no obvious ascites, no peritoneal signs, normal bowel sounds. No organomegaly. Extremities: No edema Psychiatric: alert and oriented x3. Cooperative   ASSESSMENT:  #1. Belching without worrisome features #2. Atypical chest pain. Not certain this is GI. Possibly better on PPI #3. Loose stools with incontinence. Similar previous episode that responded to metronidazole #4. Vague fleeting right lower quadrant discomfort. No worrisome features #5. Multiple significant medical problems #6. History of adenomatous colon polyps. Surveillance up-to-date. Last colonoscopy in 2011 was negative.  PLAN:  #1. Discussion on intestinal gas #2. Literature on intestinal gas as well as anti-gas and flatulence dietary sheet #3. Empiric course of metronidazole 250 mg 4 times a day x10 days. One refill for future use if needed #4. Reassurance regarding her lower quadrant discomfort. #5. Continue ongoing followup with PCP and other specialists. GI followup as needed

## 2013-04-29 NOTE — Patient Instructions (Signed)
We have sent the following medications to your pharmacy for you to pick up at your convenience:  Flagyl (Metronidazole)

## 2013-05-02 DIAGNOSIS — M25519 Pain in unspecified shoulder: Secondary | ICD-10-CM | POA: Diagnosis not present

## 2013-05-02 DIAGNOSIS — M25669 Stiffness of unspecified knee, not elsewhere classified: Secondary | ICD-10-CM | POA: Diagnosis not present

## 2013-05-02 DIAGNOSIS — M7530 Calcific tendinitis of unspecified shoulder: Secondary | ICD-10-CM | POA: Diagnosis not present

## 2013-05-04 DIAGNOSIS — M7530 Calcific tendinitis of unspecified shoulder: Secondary | ICD-10-CM | POA: Diagnosis not present

## 2013-05-04 DIAGNOSIS — M25669 Stiffness of unspecified knee, not elsewhere classified: Secondary | ICD-10-CM | POA: Diagnosis not present

## 2013-05-04 DIAGNOSIS — M25519 Pain in unspecified shoulder: Secondary | ICD-10-CM | POA: Diagnosis not present

## 2013-05-09 DIAGNOSIS — M25669 Stiffness of unspecified knee, not elsewhere classified: Secondary | ICD-10-CM | POA: Diagnosis not present

## 2013-05-09 DIAGNOSIS — M7530 Calcific tendinitis of unspecified shoulder: Secondary | ICD-10-CM | POA: Diagnosis not present

## 2013-05-09 DIAGNOSIS — M25519 Pain in unspecified shoulder: Secondary | ICD-10-CM | POA: Diagnosis not present

## 2013-05-16 DIAGNOSIS — M25519 Pain in unspecified shoulder: Secondary | ICD-10-CM | POA: Diagnosis not present

## 2013-05-16 DIAGNOSIS — M7500 Adhesive capsulitis of unspecified shoulder: Secondary | ICD-10-CM | POA: Diagnosis not present

## 2013-05-16 DIAGNOSIS — M7530 Calcific tendinitis of unspecified shoulder: Secondary | ICD-10-CM | POA: Diagnosis not present

## 2013-05-30 ENCOUNTER — Ambulatory Visit: Payer: Medicare Other | Admitting: Cardiology

## 2013-06-13 ENCOUNTER — Other Ambulatory Visit: Payer: Self-pay | Admitting: Cardiology

## 2013-06-16 ENCOUNTER — Ambulatory Visit (INDEPENDENT_AMBULATORY_CARE_PROVIDER_SITE_OTHER): Payer: Medicare Other | Admitting: Pulmonary Disease

## 2013-06-16 ENCOUNTER — Encounter: Payer: Self-pay | Admitting: Pulmonary Disease

## 2013-06-16 VITALS — BP 124/58 | HR 95 | Temp 97.5°F | Ht 66.0 in | Wt 222.0 lb

## 2013-06-16 DIAGNOSIS — R911 Solitary pulmonary nodule: Secondary | ICD-10-CM

## 2013-06-16 DIAGNOSIS — G4733 Obstructive sleep apnea (adult) (pediatric): Secondary | ICD-10-CM | POA: Diagnosis not present

## 2013-06-16 DIAGNOSIS — J84112 Idiopathic pulmonary fibrosis: Secondary | ICD-10-CM

## 2013-06-16 LAB — PULMONARY FUNCTION TEST
DL/VA % pred: 78 %
DLCO unc % pred: 68 %
DLCO unc: 18.58 ml/min/mmHg
FEF 25-75 Pre: 2.72 L/sec
FEF2575-%Pred-Pre: 136 %
FEV1-%Change-Post: 0 %
FEV6-%Pred-Post: 94 %
FEV6-Post: 2.92 L
FEV6-Pre: 3.07 L
FEV6FVC-%Pred-Post: 104 %
FEV6FVC-%Pred-Pre: 104 %
FVC-%Change-Post: -4 %
FVC-%Pred-Pre: 95 %
FVC-Post: 2.92 L
Post FEV1/FVC ratio: 87 %
Post FEV6/FVC ratio: 100 %
RV: 1.57 L
TLC % pred: 86 %
TLC: 4.63 L

## 2013-06-16 NOTE — Patient Instructions (Signed)
CT chest in May 2015 Your fibrosis is stable We will check CPAP download

## 2013-06-16 NOTE — Assessment & Plan Note (Signed)
Ct CPAP 5-11  Weight loss encouraged, compliance with goal of at least 4-6 hrs every night is the expectation. Advised against medications with sedative side effects Cautioned against driving when sleepy - understanding that sleepiness will vary on a day to day basis

## 2013-06-16 NOTE — Assessment & Plan Note (Signed)
Radiographic c/w IPF but stable lung function Discussed new meds & side effects in detail Hold off for now

## 2013-06-16 NOTE — Progress Notes (Signed)
PFT done today. 

## 2013-06-16 NOTE — Assessment & Plan Note (Signed)
FU CT in may 2015

## 2013-06-16 NOTE — Progress Notes (Signed)
Subjective:    Patient ID: Jody Ruiz, female    DOB: 10-08-1940, 72 y.o.   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  Initially referred 08/26/2012 to pulmonary clinic by Dr Jody Ruiz for abn cxr & new onset sob Jan 2014 - thought to be pulmonary fibrosis  She had LAD stent in 10/13 & rpt cath 1/14 showed Patent stent  She underwent following workup for dyspnea :  No ambulatory desats  Elevated rt hemi-D due to eventration, Neg sniff test , Neg PE on CT  Nml EF, grade 1 diastolic on echo  nml bnp  CT chest showed Subpleural fibrosis at the lung bases & sub -sm nodles Rt fissure, largest 8mm  PSG showed AHI 66/h with predominant hypopneas & nadir desatn of 79% corrected by CPAP 11 cm, with med full face mask.  Download 5/14 on 12 cm showed residual 9/h, good compliance, small leak   For bloating ,changed to auto--> Download on auto 5-11 02/04/13 >> residual 7/h   Good usage, min leak , bloating improved     06/16/2013  67m FU Underwent rpt cath -patent LAD stents, nml LV fn Referred to GI  Jody Ruiz)- put on flagyl -since had received 3 rounds of abx fro UTI, then bronchitis Was not using CPAP for a while (while above symptoms) back on it now -no bloating  Dyspnea stable, CT chest  02/2013 >> Nodules are stable Fibrosis slight worse than feb 2014  TLC 5.03 -stable FVC 3.3--> 3.07 (06/2013) DLCO 15.7 --> 18.6 - 68% , improved   Past Medical History  Diagnosis Date  . Anemia   . Hx of adenomatous colonic polyps   . CAD (coronary artery disease)     a. NSTEMI 10/13 => LHC 04/28/12:  pLAD 90%, mLAD 70-80% (long), pD1 70-80%, prox Int Br 30% (small), CFX 20%, OM1 40%, mOM2 30%, pRCA 30%, PDA 30-40%, pPLB 70%, then mid 90%, EF 65%.  PCI:  Promus DES x 2 to prox and mid LAD (3x81mm and 2.5x68mm);  b. 07/2012 Cath: LM nl, LAD patent stents, LCX 20, OM1 40p, OM2 34m, RCA 30pPDA 30-40, RPL 40p, 63m, EF 55-60% ->Cont Med Rx.  Marland Kitchen Hypertension   .  Microscopic colitis   . Arthritis   . Obesity   . HLD (hyperlipidemia)     intolerant to statins  . Dyspnea     a. CP and SOB 06/2012 => Ticagrelor changed to Plavix  . Myocardial infarction 04/2012     non stemi  . Hx of echocardiogram     a. Echo 2/14:  mild LVH, EF 55-65%, Gr 1 diast dysfn, mild LAE  . OSA (obstructive sleep apnea)     on CPAP  . Internal hemorrhoids   . Idiopathic pulmonary fibrosis   . Lung nodules      Review of Systems neg for any significant sore throat, dysphagia, itching, sneezing, nasal congestion or excess/ purulent secretions, fever, chills, sweats, unintended wt loss, pleuritic or exertional cp, hempoptysis, orthopnea pnd or change in chronic leg swelling. Also denies presyncope, palpitations, heartburn, abdominal pain, nausea, vomiting, diarrhea or change in bowel or urinary habits, dysuria,hematuria, rash, arthralgias, visual complaints, headache, numbness weakness or ataxia.     Objective:   Physical Exam  Gen. Pleasant, obese, in no distress, normal affect ENT - no lesions, no post nasal drip, class 2-3 airway Neck: No JVD, no thyromegaly, no carotid bruits Lungs: no use  of accessory muscles, no dullness to percussion, bibasal rales, no rhonchi  Cardiovascular: Rhythm regular, heart sounds  normal, no murmurs or gallops, no peripheral edema Abdomen: soft and non-tender, no hepatosplenomegaly, BS normal. Musculoskeletal: No deformities, no cyanosis or clubbing Neuro:  alert, non focal, no tremors        Assessment & Plan:

## 2013-07-05 ENCOUNTER — Ambulatory Visit (INDEPENDENT_AMBULATORY_CARE_PROVIDER_SITE_OTHER): Payer: Medicare Other | Admitting: Internal Medicine

## 2013-07-05 ENCOUNTER — Encounter: Payer: Self-pay | Admitting: Internal Medicine

## 2013-07-05 VITALS — BP 120/72 | HR 68 | Ht 66.0 in | Wt 218.8 lb

## 2013-07-05 DIAGNOSIS — R197 Diarrhea, unspecified: Secondary | ICD-10-CM | POA: Diagnosis not present

## 2013-07-05 NOTE — Patient Instructions (Signed)
Your physician has requested that you go to the basement for the following lab work before leaving today:  Stool pathogen panel  Complete the course of Flagyl you have begun.  Ok to use Imodium for diarrhea  Remember to stay hydrated  Please follow up with Dr. [name] as needed

## 2013-07-05 NOTE — Progress Notes (Signed)
HISTORY OF PRESENT ILLNESS:  Jody Ruiz is a 72 y.o. female with multiple significant medical problems including hypertension, coronary artery disease status post myocardial infarction and coronary artery stenting, morbid obesity, osteoarthritis, adenomatous colon polyps, sleep apnea, pulmonary fibrosis, and microscopic colitis (previously under the care of Dr. Dorena Cookey for greater than 1 decade). Last seen in this office 04/29/2013 regarding several GI complaints such as belching, atypical chest pain, loose stools with incontinence, and vague right lower quadrant discomfort. See that dictation for details. Her issues with gas, atypical chest discomfort, and right lower quadrant discomfort were felt to be insignificant. In terms of her loose stools, she was treated empirically with metronidazole 250 mg 4 times daily for 10 days. She reports to me that her symptoms resolved. She states she was doing well until 5 days ago when she developed an acute watery diarrheal illness with lower abdominal discomfort. As well nausea. No vomiting. She reinitiated metronidazole yesterday (had one refill on her previous prescription for future use if needed). Working days office schedule after contacted the office earlier. Patient denies fever or bleeding. No additional active GI complaints. Significant stress in her life regarding multiple family members health, social issues, as well as problems with pets health. She is emotional.  REVIEW OF SYSTEMS:  All non-GI ROS negative except for fatigue and back pain  Past Medical History  Diagnosis Date  . Anemia   . Hx of adenomatous colonic polyps   . CAD (coronary artery disease)     a. NSTEMI 10/13 => LHC 04/28/12:  pLAD 90%, mLAD 70-80% (long), pD1 70-80%, prox Int Br 30% (small), CFX 20%, OM1 40%, mOM2 30%, pRCA 30%, PDA 30-40%, pPLB 70%, then mid 90%, EF 65%.  PCI:  Promus DES x 2 to prox and mid LAD (3x11mm and 2.5x47mm);  b. 07/2012 Cath: LM nl, LAD patent  stents, LCX 20, OM1 40p, OM2 37m, RCA 30pPDA 30-40, RPL 40p, 17m, EF 55-60% ->Cont Med Rx.  Marland Kitchen Hypertension   . Microscopic colitis   . Arthritis   . Obesity   . HLD (hyperlipidemia)     intolerant to statins  . Dyspnea     a. CP and SOB 06/2012 => Ticagrelor changed to Plavix  . Myocardial infarction 04/2012     non stemi  . Hx of echocardiogram     a. Echo 2/14:  mild LVH, EF 55-65%, Gr 1 diast dysfn, mild LAE  . OSA (obstructive sleep apnea)     on CPAP  . Internal hemorrhoids   . Idiopathic pulmonary fibrosis   . Lung nodules     Past Surgical History  Procedure Laterality Date  . Appendectomy    . Cholecystectomy    . Ventral hernia repair    . Breast biopsy      bilateral  . Knee arthroscopy      bilateral  . Vaginal polypectomy    . Shoulder arthroscopy  2012    left  . Cardiac catheterization      x4  . Coronary stent placement  04/2012    x2    Social History Jody Ruiz  reports that she has never smoked. She has never used smokeless tobacco. She reports that she does not drink alcohol or use illicit drugs.  family history includes Breast cancer in her sister; Colon cancer in her sister; Colon polyps in her father; Heart disease in her brother, father, mother, and sister; Kidney disease in her father; Prostate cancer in her  father.  Allergies  Allergen Reactions  . Brilinta [Ticagrelor] Shortness Of Breath and Cough    fatigue  . Lisinopril Shortness Of Breath and Cough  . Statins     Muscle Weakness  . Macrodantin [Nitrofurantoin Macrocrystal] Rash  . Sulfa Antibiotics Rash       PHYSICAL EXAMINATION: Vital signs: BP 120/72  Pulse 68  Ht 5\' 6"  (1.676 m)  Wt 218 lb 12.8 oz (99.247 kg)  BMI 35.33 kg/m2 General: Well-developed, well-nourished, no acute distress HEENT: Sclerae are anicteric, conjunctiva pink. Oral mucosa intact Lungs: Clear Heart: Regular Abdomen: soft, obese, nontender, nondistended, no obvious ascites, no peritoneal  signs, normal bowel sounds. No organomegaly. Extremities: No edema Psychiatric: alert and oriented x3. Cooperative   ASSESSMENT:  #1. Acute diarrheal illness. Maybe norovirus, as this is going around. She reinitiated Flagyl yesterday #2. Anxiety/depression #3. History of adenomatous colon polyps. Last colonoscopy 2011 was negative  PLAN:  #1. Stool for GI pathogens panel #2. Hydration #3. Complete initiated metronidazole #4. Imodium as needed for diarrhea #5. If symptoms persist after the above, contact the office. Otherwise followup as needed

## 2013-07-06 ENCOUNTER — Other Ambulatory Visit: Payer: Medicare Other

## 2013-07-06 ENCOUNTER — Other Ambulatory Visit: Payer: Self-pay | Admitting: Internal Medicine

## 2013-07-06 DIAGNOSIS — R197 Diarrhea, unspecified: Secondary | ICD-10-CM

## 2013-07-06 DIAGNOSIS — R141 Gas pain: Secondary | ICD-10-CM | POA: Diagnosis not present

## 2013-07-11 ENCOUNTER — Telehealth: Payer: Self-pay | Admitting: Internal Medicine

## 2013-07-11 LAB — GASTROINTESTINAL PATHOGEN PANEL PCR
C. difficile Tox A/B, PCR: NEGATIVE
Campylobacter, PCR: NEGATIVE
Cryptosporidium, PCR: NEGATIVE
E coli (ETEC) LT/ST PCR: NEGATIVE
E coli (STEC) stx1/stx2, PCR: NEGATIVE
E coli 0157, PCR: NEGATIVE
Giardia lamblia, PCR: NEGATIVE
Norovirus, PCR: NEGATIVE
Rotavirus A, PCR: NEGATIVE
Salmonella, PCR: NEGATIVE
Shigella, PCR: NEGATIVE

## 2013-07-11 NOTE — Telephone Encounter (Signed)
Pt states she is still taking Flagyl and had gotten some better. States last night the pain came back and the diarrhea has returned like it was last week. Stool pathogen panel final result not back yet. Pt instructed to take Immodium and hydrate. Please advise if she needs to do anything different pending lab result.

## 2013-07-11 NOTE — Telephone Encounter (Signed)
Agree for now

## 2013-07-12 NOTE — Telephone Encounter (Signed)
Stool study negative and pt aware.

## 2013-07-15 DIAGNOSIS — L820 Inflamed seborrheic keratosis: Secondary | ICD-10-CM | POA: Diagnosis not present

## 2013-07-15 DIAGNOSIS — L821 Other seborrheic keratosis: Secondary | ICD-10-CM | POA: Diagnosis not present

## 2013-07-15 DIAGNOSIS — D1801 Hemangioma of skin and subcutaneous tissue: Secondary | ICD-10-CM | POA: Diagnosis not present

## 2013-07-16 ENCOUNTER — Observation Stay (HOSPITAL_COMMUNITY)
Admission: EM | Admit: 2013-07-16 | Discharge: 2013-07-17 | Disposition: A | Discharge status: Home / Self Care | Payer: Medicare Other | Attending: Cardiology | Admitting: Cardiology

## 2013-07-16 ENCOUNTER — Encounter (HOSPITAL_COMMUNITY): Payer: Self-pay | Admitting: Emergency Medicine

## 2013-07-16 ENCOUNTER — Emergency Department (HOSPITAL_COMMUNITY): Payer: Medicare Other

## 2013-07-16 DIAGNOSIS — Z9861 Coronary angioplasty status: Secondary | ICD-10-CM | POA: Insufficient documentation

## 2013-07-16 DIAGNOSIS — E669 Obesity, unspecified: Secondary | ICD-10-CM | POA: Diagnosis present

## 2013-07-16 DIAGNOSIS — K5289 Other specified noninfective gastroenteritis and colitis: Secondary | ICD-10-CM | POA: Diagnosis not present

## 2013-07-16 DIAGNOSIS — R0602 Shortness of breath: Secondary | ICD-10-CM

## 2013-07-16 DIAGNOSIS — I252 Old myocardial infarction: Secondary | ICD-10-CM | POA: Diagnosis not present

## 2013-07-16 DIAGNOSIS — Z7982 Long term (current) use of aspirin: Secondary | ICD-10-CM | POA: Insufficient documentation

## 2013-07-16 DIAGNOSIS — M129 Arthropathy, unspecified: Secondary | ICD-10-CM | POA: Diagnosis not present

## 2013-07-16 DIAGNOSIS — Z888 Allergy status to other drugs, medicaments and biological substances status: Secondary | ICD-10-CM | POA: Diagnosis not present

## 2013-07-16 DIAGNOSIS — D649 Anemia, unspecified: Secondary | ICD-10-CM | POA: Diagnosis not present

## 2013-07-16 DIAGNOSIS — R079 Chest pain, unspecified: Secondary | ICD-10-CM

## 2013-07-16 DIAGNOSIS — Z860100 Personal history of colon polyps, unspecified: Secondary | ICD-10-CM | POA: Insufficient documentation

## 2013-07-16 DIAGNOSIS — M549 Dorsalgia, unspecified: Secondary | ICD-10-CM | POA: Diagnosis not present

## 2013-07-16 DIAGNOSIS — K648 Other hemorrhoids: Secondary | ICD-10-CM | POA: Insufficient documentation

## 2013-07-16 DIAGNOSIS — I10 Essential (primary) hypertension: Secondary | ICD-10-CM

## 2013-07-16 DIAGNOSIS — Z79899 Other long term (current) drug therapy: Secondary | ICD-10-CM | POA: Diagnosis not present

## 2013-07-16 DIAGNOSIS — R918 Other nonspecific abnormal finding of lung field: Secondary | ICD-10-CM | POA: Diagnosis not present

## 2013-07-16 DIAGNOSIS — E785 Hyperlipidemia, unspecified: Secondary | ICD-10-CM | POA: Diagnosis not present

## 2013-07-16 DIAGNOSIS — J84112 Idiopathic pulmonary fibrosis: Secondary | ICD-10-CM

## 2013-07-16 DIAGNOSIS — R911 Solitary pulmonary nodule: Secondary | ICD-10-CM

## 2013-07-16 DIAGNOSIS — R0789 Other chest pain: Principal | ICD-10-CM

## 2013-07-16 DIAGNOSIS — G4733 Obstructive sleep apnea (adult) (pediatric): Secondary | ICD-10-CM

## 2013-07-16 DIAGNOSIS — I2510 Atherosclerotic heart disease of native coronary artery without angina pectoris: Secondary | ICD-10-CM

## 2013-07-16 DIAGNOSIS — J8410 Pulmonary fibrosis, unspecified: Secondary | ICD-10-CM | POA: Diagnosis not present

## 2013-07-16 LAB — COMPREHENSIVE METABOLIC PANEL
ALK PHOS: 74 U/L (ref 39–117)
ALT: 32 U/L (ref 0–35)
AST: 32 U/L (ref 0–37)
Albumin: 3.9 g/dL (ref 3.5–5.2)
BILIRUBIN TOTAL: 0.3 mg/dL (ref 0.3–1.2)
BUN: 20 mg/dL (ref 6–23)
CHLORIDE: 100 meq/L (ref 96–112)
CO2: 23 mEq/L (ref 19–32)
Calcium: 9.4 mg/dL (ref 8.4–10.5)
Creatinine, Ser: 0.65 mg/dL (ref 0.50–1.10)
GFR calc non Af Amer: 88 mL/min — ABNORMAL LOW (ref >90)
Glucose, Bld: 133 mg/dL — ABNORMAL HIGH (ref 70–99)
Potassium: 3.8 mEq/L (ref 3.7–5.3)
SODIUM: 138 meq/L (ref 137–147)
TOTAL PROTEIN: 8 g/dL (ref 6.0–8.3)

## 2013-07-16 LAB — CBC WITH DIFFERENTIAL/PLATELET
Basophils Absolute: 0 10*3/uL (ref 0.0–0.1)
Basophils Relative: 0 % (ref 0–1)
Eosinophils Absolute: 0.2 10*3/uL (ref 0.0–0.7)
Eosinophils Relative: 2 % (ref 0–5)
HCT: 38.7 % (ref 36.0–46.0)
Hemoglobin: 13.3 g/dL (ref 12.0–15.0)
LYMPHS ABS: 2.3 10*3/uL (ref 0.7–4.0)
Lymphocytes Relative: 28 % (ref 12–46)
MCH: 31.4 pg (ref 26.0–34.0)
MCHC: 34.4 g/dL (ref 30.0–36.0)
MCV: 91.5 fL (ref 78.0–100.0)
Monocytes Absolute: 0.5 10*3/uL (ref 0.1–1.0)
Monocytes Relative: 6 % (ref 3–12)
NEUTROS PCT: 63 % (ref 43–77)
Neutro Abs: 5.2 10*3/uL (ref 1.7–7.7)
PLATELETS: 319 10*3/uL (ref 150–400)
RBC: 4.23 MIL/uL (ref 3.87–5.11)
RDW: 12.9 % (ref 11.5–15.5)
WBC: 8.2 10*3/uL (ref 4.0–10.5)

## 2013-07-16 LAB — POCT I-STAT TROPONIN I: Troponin i, poc: 0 ng/mL (ref 0.00–0.08)

## 2013-07-16 NOTE — ED Notes (Signed)
Pt to ED via [facility] for evaluation of chest pain- pt reports midsternal CP radiation to back and down left arm, pt took 324 ASA at home and 2 nitro with relief.  Pt denies any chest pain upon arrival to ED.  NSR noted on monitor- pt has hx of NSTEMI in November 2013.  Pt alert and oriented X 4 upon arrival to ED.

## 2013-07-16 NOTE — ED Notes (Signed)
Pt transported to xray 

## 2013-07-16 NOTE — ED Provider Notes (Signed)
CSN: [id number]     Arrival date & time 07/16/13  2210 History   First MD Initiated Contact with Patient 07/16/13 2258     Chief Complaint  Patient presents with  . Chest Pain   (Consider location/radiation/quality/duration/timing/severity/associated sxs/prior Treatment) HPI Hx per PT - L sided CP while at home tonight. Radiating to L arm and to her back.  Took 2 NTG without relief. Called EMS and received baby ASA and more NTG and by the time she arrived to the ER is pain free.  Some nausea no emesis. Some SOB improved with oxygen in route. Pain sharp in quality, moderate in severity. Pain lasted about an hour. Now 0/10. H/o CAD and NSTEMI 2013  Past Medical History  Diagnosis Date  . Anemia   . Hx of adenomatous colonic polyps   . CAD (coronary artery disease)     a. NSTEMI 10/13 => LHC 04/28/12:  pLAD 90%, mLAD 70-80% (long), pD1 70-80%, prox Int Br 30% (small), CFX 20%, OM1 40%, mOM2 30%, pRCA 30%, PDA 30-40%, pPLB 70%, then mid 90%, EF 65%.  PCI:  Promus DES x 2 to prox and mid LAD (3x43mm and 2.5x75mm);  b. 07/2012 Cath: LM nl, LAD patent stents, LCX 20, OM1 40p, OM2 21m, RCA 30pPDA 30-40, RPL 40p, 45m, EF 55-60% ->Cont Med Rx.  Marland Kitchen Hypertension   . Microscopic colitis   . Arthritis   . Obesity   . HLD (hyperlipidemia)     intolerant to statins  . Dyspnea     a. CP and SOB 06/2012 => Ticagrelor changed to Plavix  . Myocardial infarction 04/2012     non stemi  . Hx of echocardiogram     a. Echo 2/14:  mild LVH, EF 55-65%, Gr 1 diast dysfn, mild LAE  . OSA (obstructive sleep apnea)     on CPAP  . Internal hemorrhoids   . Idiopathic pulmonary fibrosis   . Lung nodules    Past Surgical History  Procedure Laterality Date  . Appendectomy    . Cholecystectomy    . Ventral hernia repair    . Breast biopsy      bilateral  . Knee arthroscopy      bilateral  . Vaginal polypectomy    . Shoulder arthroscopy  2012    left  . Cardiac catheterization      x4  . Coronary stent  placement  04/2012    x2   Family History  Problem Relation Age of Onset  . Breast cancer Sister   . Prostate cancer Father   . Colon cancer Sister   . Colon polyps Father   . Heart disease Mother   . Heart disease Father   . Heart disease Brother   . Heart disease Sister   . Kidney disease Father    History  Substance Use Topics  . Smoking status: Never Smoker   . Smokeless tobacco: Never Used  . Alcohol Use: No   OB History   Grav Para Term Preterm Abortions TAB SAB Ect Mult Living                 Review of Systems  Constitutional: Negative for fever and chills.  Respiratory: Negative for cough and wheezing.   Cardiovascular: Positive for chest pain.  Gastrointestinal: Negative for vomiting and abdominal pain.  Genitourinary: Negative for flank pain.  Musculoskeletal: Positive for back pain.  Skin: Negative for rash.  Neurological: Negative for headaches.  All other systems reviewed and  are negative.    Allergies  Brilinta; Lisinopril; Statins; Macrodantin; and Sulfa antibiotics  Home Medications   Current Outpatient Rx  Name  Route  Sig  Dispense  Refill  . amLODipine (NORVASC) 5 MG tablet   Oral   Take 5 mg by mouth daily.         Marland Kitchen aspirin EC 81 MG tablet   Oral   Take 81 mg by mouth daily.         . isosorbide mononitrate (IMDUR) 30 MG 24 hr tablet   Oral   Take 30 mg by mouth at bedtime.         . metoprolol succinate (TOPROL XL) 50 MG 24 hr tablet   Oral   Take 1 tablet (50 mg total) by mouth daily. Take with or immediately following a meal.         . metroNIDAZOLE (FLAGYL) 250 MG tablet   Oral   Take 250 mg by mouth 4 (four) times daily.          . nitroGLYCERIN (NITROSTAT) 0.4 MG SL tablet   Sublingual   Place 0.4 mg under the tongue every 5 (five) minutes as needed for chest pain.         . pantoprazole (PROTONIX) 40 MG tablet   Oral   Take 40 mg by mouth daily.         . polyvinyl alcohol (LIQUIFILM TEARS) 1.4 %  ophthalmic solution   Both Eyes   Place 1 drop into both eyes as needed. For dry eyes          BP 141/77  Pulse 74  Temp(Src) 98.1 F (36.7 C) (Oral)  Resp 18  Ht 5\' 6"  (1.676 m)  Wt 221 lb (100.245 kg)  BMI 35.69 kg/m2  SpO2 99% Physical Exam  Nursing note and vitals reviewed. Constitutional: She is oriented to person, place, and time. She appears well-developed and well-nourished.  HENT:  Head: Normocephalic and atraumatic.  Mouth/Throat: Oropharynx is clear and moist.  Eyes: EOM are normal. Pupils are equal, round, and reactive to light.  Neck: Neck supple.  Cardiovascular: Normal rate, regular rhythm and intact distal pulses.   Pulmonary/Chest: Effort normal and breath sounds normal. No respiratory distress. She exhibits no tenderness.  Abdominal: Soft. Bowel sounds are normal. She exhibits no distension. There is no tenderness.  Musculoskeletal: Normal range of motion. She exhibits no edema and no tenderness.  Neurological: She is alert and oriented to person, place, and time. No cranial nerve deficit.  Skin: Skin is warm and dry.    ED Course  Procedures (including critical care time) Labs Review Labs Reviewed  COMPREHENSIVE METABOLIC PANEL - Abnormal; Notable for the following:    Glucose, Bld 133 (*)    GFR calc non Af Amer 88 (*)    All other components within normal limits  CBC WITH DIFFERENTIAL  POCT I-STAT TROPONIN I   Imaging Review Dg Chest 2 View  07/16/2013   CLINICAL DATA:  Chest pain.  EXAM: CHEST  2 VIEW  COMPARISON:  August 02, 2012.  FINDINGS: The heart size and mediastinal contours are within normal limits. Both lungs are clear. No pleural effusion or pneumothorax is noted. Stable elevation of right hemidiaphragm. The visualized skeletal structures are unremarkable.  IMPRESSION: No active cardiopulmonary disease.   Electronically Signed   By: Sabino Dick M.D.   On: 07/16/2013 23:10    EKG Interpretation    Date/Time:  Saturday July 16 2013  22:21:10 EST Ventricular Rate:  79 PR Interval:  195 QRS Duration: 95 QT Interval:  379 QTC Calculation: 434 R Axis:   42 Text Interpretation:  Sinus rhythm Low voltage, extremity leads No significant change since last tracing Confirmed by SHELDON  MD, CHARLES [phone]) on 07/16/2013 10:31:21 PM           ASA, NTG PTA  Previous records reviewed, last cardiology admit October 2014 with cath at that time - patent LAD stent with diffuse disease, EF 55-60%  4:44 AM d/w Dr Percival Spanish, will evaluate bedside. Remains CP free. MDM  Dx: CP history of coronary artery disease  ECG, labs, CXR, CT chest -no PE CAR consult/ admit    Teressa Lower, MD 07/17/13 [phone]

## 2013-07-17 ENCOUNTER — Encounter (HOSPITAL_COMMUNITY): Payer: Self-pay | Admitting: Cardiology

## 2013-07-17 ENCOUNTER — Emergency Department (HOSPITAL_COMMUNITY): Payer: Medicare Other

## 2013-07-17 DIAGNOSIS — I10 Essential (primary) hypertension: Secondary | ICD-10-CM | POA: Diagnosis not present

## 2013-07-17 DIAGNOSIS — R0602 Shortness of breath: Secondary | ICD-10-CM

## 2013-07-17 DIAGNOSIS — I2510 Atherosclerotic heart disease of native coronary artery without angina pectoris: Secondary | ICD-10-CM

## 2013-07-17 DIAGNOSIS — R079 Chest pain, unspecified: Secondary | ICD-10-CM | POA: Diagnosis not present

## 2013-07-17 DIAGNOSIS — R911 Solitary pulmonary nodule: Secondary | ICD-10-CM

## 2013-07-17 DIAGNOSIS — E785 Hyperlipidemia, unspecified: Secondary | ICD-10-CM | POA: Diagnosis not present

## 2013-07-17 DIAGNOSIS — J84112 Idiopathic pulmonary fibrosis: Secondary | ICD-10-CM

## 2013-07-17 DIAGNOSIS — G4733 Obstructive sleep apnea (adult) (pediatric): Secondary | ICD-10-CM

## 2013-07-17 DIAGNOSIS — R0789 Other chest pain: Secondary | ICD-10-CM

## 2013-07-17 DIAGNOSIS — R072 Precordial pain: Secondary | ICD-10-CM | POA: Insufficient documentation

## 2013-07-17 LAB — TROPONIN I: Troponin I: <0.3 ng/mL (ref <0.30)

## 2013-07-17 LAB — POCT I-STAT TROPONIN I: TROPONIN I, POC: 0.06 ng/mL (ref 0.00–0.08)

## 2013-07-17 LAB — D-DIMER, QUANTITATIVE: D-Dimer, Quant: 0.72 ug/mL-FEU — ABNORMAL HIGH (ref 0.00–0.48)

## 2013-07-17 MED ORDER — ISOSORBIDE MONONITRATE ER 30 MG PO TB24
30.0000 mg | ORAL_TABLET | Freq: Every day | ORAL | Status: DC
  Filled 2013-07-17: qty 1

## 2013-07-17 MED ORDER — ASPIRIN EC 81 MG PO TBEC
81.0000 mg | DELAYED_RELEASE_TABLET | Freq: Every day | ORAL | Status: DC
  Administered 2013-07-17: 81 mg via ORAL
  Filled 2013-07-17: qty 1

## 2013-07-17 MED ORDER — IOHEXOL 350 MG/ML SOLN
100.0000 mL | Freq: Once | INTRAVENOUS | Status: AC | PRN
  Administered 2013-07-17: 100 mL via INTRAVENOUS

## 2013-07-17 MED ORDER — ACETAMINOPHEN 325 MG PO TABS: 650.0000 mg | ORAL_TABLET | ORAL | Status: DC | PRN

## 2013-07-17 MED ORDER — METOPROLOL SUCCINATE ER 50 MG PO TB24
50.0000 mg | ORAL_TABLET | Freq: Every day | ORAL | Status: DC
  Administered 2013-07-17: 50 mg via ORAL
  Filled 2013-07-17: qty 1

## 2013-07-17 MED ORDER — ONDANSETRON HCL 4 MG/2ML IJ SOLN: 4.0000 mg | Freq: Four times a day (QID) | INTRAMUSCULAR | Status: DC | PRN

## 2013-07-17 MED ORDER — NITROGLYCERIN 0.4 MG SL SUBL
0.4000 mg | SUBLINGUAL_TABLET | SUBLINGUAL | Status: AC | PRN
  Administered 2013-07-17 (×3): 0.4 mg via SUBLINGUAL
  Filled 2013-07-17: qty 25

## 2013-07-17 MED ORDER — NITROGLYCERIN 0.4 MG SL SUBL: 0.4000 mg | SUBLINGUAL_TABLET | SUBLINGUAL | Status: DC | PRN

## 2013-07-17 MED ORDER — AMLODIPINE BESYLATE 5 MG PO TABS
5.0000 mg | ORAL_TABLET | Freq: Every day | ORAL | Status: DC
  Administered 2013-07-17: 5 mg via ORAL
  Filled 2013-07-17: qty 1

## 2013-07-17 MED ORDER — ISOSORBIDE MONONITRATE ER 60 MG PO TB24: 60.0000 mg | ORAL_TABLET | Freq: Every day | ORAL | Status: DC

## 2013-07-17 MED ORDER — PANTOPRAZOLE SODIUM 40 MG PO TBEC
40.0000 mg | DELAYED_RELEASE_TABLET | Freq: Every day | ORAL | Status: DC
  Administered 2013-07-17: 40 mg via ORAL
  Filled 2013-07-17: qty 1

## 2013-07-17 NOTE — Progress Notes (Signed)
SUBJECTIVE: Pt is currently without chest pain, but did have an episode of left infrascapular pain which was similar to what led to her hospitalization. She currently denies shortness of breath and palpitations. Troponins x 2 have been normal.     Intake/Output Summary (Last 24 hours) at 07/17/13 1052 Last data filed at 07/17/13 0920  Gross per 24 hour  Intake    240 ml  Output      0 ml  Net    240 ml    Current Facility-Administered Medications  Medication Dose Route Frequency Provider Last Rate Last Dose  . acetaminophen (TYLENOL) tablet 650 mg  650 mg Oral Q4H PRN Minus Breeding, MD      . amLODipine (NORVASC) tablet 5 mg  5 mg Oral Daily Minus Breeding, MD   5 mg at 07/17/13 1015  . aspirin EC tablet 81 mg  81 mg Oral Daily Minus Breeding, MD   81 mg at 07/17/13 1015  . isosorbide mononitrate (IMDUR) 24 hr tablet 30 mg  30 mg Oral QHS Minus Breeding, MD      . metoprolol succinate (TOPROL-XL) 24 hr tablet 50 mg  50 mg Oral Daily Minus Breeding, MD   50 mg at 07/17/13 1015  . nitroGLYCERIN (NITROSTAT) SL tablet 0.4 mg  0.4 mg Sublingual Q5 min PRN Minus Breeding, MD      . ondansetron [facility]) injection 4 mg  4 mg Intravenous Q6H PRN Minus Breeding, MD      . pantoprazole (PROTONIX) EC tablet 40 mg  40 mg Oral Daily Minus Breeding, MD   40 mg at 07/17/13 1015    Filed Vitals:   07/17/13 0721 07/17/13 0727 07/17/13 0730 07/17/13 0915  BP: 167/72 132/61 121/70 133/54  Pulse: 71 76 79 69  Temp:    97.6 F (36.4 C)  TempSrc:    Oral  Resp: 17 12 16 18   Height:      Weight:      SpO2: 97% 94% 94% 95%    PHYSICAL EXAM General: NAD Neck: No JVD, no thyromegaly or thyroid nodule.  Lungs: Faint dry crackles at right base, otherwise clear to auscultation bilaterally with normal respiratory effort. CV: Nondisplaced PMI.  Regular rhythm, normal S1/S2, no S3/S4, no murmur.  No pretibial edema.  No carotid bruit.  Normal pedal pulses.  Abdomen: Soft, nontender, no  hepatosplenomegaly, no distention.  Neurologic: Alert and oriented x 3.  Psych: Normal affect. Extremities: No clubbing or cyanosis.    LABS: Basic Metabolic Panel:  Recent Labs  07/16/13 2235  NA 138  K 3.8  CL 100  CO2 23  GLUCOSE 133*  BUN 20  CREATININE 0.65  CALCIUM 9.4   Liver Function Tests:  Recent Labs  07/16/13 2235  AST 32  ALT 32  ALKPHOS 74  BILITOT 0.3  PROT 8.0  ALBUMIN 3.9   No results found for this basename: LIPASE, AMYLASE,  in the last 72 hours CBC:  Recent Labs  07/16/13 2235  WBC 8.2  NEUTROABS 5.2  HGB 13.3  HCT 38.7  MCV 91.5  PLT 319   Cardiac Enzymes: No results found for this basename: CKTOTAL, CKMB, CKMBINDEX, TROPONINI,  in the last 72 hours BNP: No components found with this basename: POCBNP,  D-Dimer:  Recent Labs  07/16/13 2359  DDIMER 0.72*   Hemoglobin A1C: No results found for this basename: HGBA1C,  in the last 72 hours Fasting Lipid Panel: No results found for this  basename: CHOL, HDL, LDLCALC, TRIG, CHOLHDL, LDLDIRECT,  in the last 72 hours Thyroid Function Tests: No results found for this basename: TSH, T4TOTAL, FREET3, T3FREE, THYROIDAB,  in the last 72 hours Anemia Panel: No results found for this basename: VITAMINB12, FOLATE, FERRITIN, TIBC, IRON, RETICCTPCT,  in the last 72 hours  RADIOLOGY: Dg Chest 2 View  07/16/2013   CLINICAL DATA:  Chest pain.  EXAM: CHEST  2 VIEW  COMPARISON:  August 02, 2012.  FINDINGS: The heart size and mediastinal contours are within normal limits. Both lungs are clear. No pleural effusion or pneumothorax is noted. Stable elevation of right hemidiaphragm. The visualized skeletal structures are unremarkable.  IMPRESSION: No active cardiopulmonary disease.   Electronically Signed   By: Sabino Dick M.D.   On: 07/16/2013 23:10   Ct Angio Chest Pe W/cm &/or Wo Cm  07/17/2013   CLINICAL DATA:  Midsternal chest pain beginning 10 9.  EXAM: CT ANGIOGRAPHY CHEST WITH CONTRAST   TECHNIQUE: Multidetector CT imaging of the chest was performed using the standard protocol during bolus administration of intravenous contrast. Multiplanar CT image reconstructions including MIPs were obtained to evaluate the vascular anatomy.  CONTRAST:  100 mL OMNIPAQUE IOHEXOL 350 MG/ML SOLN  COMPARISON:  CT chest 08/27/2012.  FINDINGS: No pulmonary embolus is identified. Coronary artery disease is seen. A stent is identified in the left anterior descending coronary artery. Heart size is upper normal. There is no axillary, hilar or mediastinal lymphadenopathy. 0.8 cm nodule adjacent to the minor fissure is unchanged on image 40. A 0.4 cm right middle lobe nodule is unchanged on image 48. A subpleural right lower lobe nodule measuring 0.7 cm is unchanged on image 29. No new or enlarging nodules are identified.  Incidentally imaged upper abdomen demonstrates no focal abnormality. There is thoracic spondylosis but no worrisome lytic or sclerotic bony lesion.  Review of the MIP images confirms the above findings.  IMPRESSION: Negative for pulmonary embolus or acute disease.  Scattered pulmonary nodules subcentimeter pulmonary nodules are unchanged.   Electronically Signed   By: Inge Rise M.D.   On: 07/17/2013 03:15      ASSESSMENT AND PLAN: 1. Chest pain in the setting of known CAD: given her mild to moderate PDA disease, she may be experiencing some degree of coronary vasospasm. Will increase Imdur to 60 mg daily. Will also check one additional troponin. If this is normal, will plan to d/c with close f/u with Dr. Martinique, her cardiologist. 2. HTN: controlled on present therapy. 3. Hyperlipidemia: apparently intolerant to statins and had been on Zetia as per office note by Dr. Martinique in 04/2013. Will clarify with pt.  Dispo: if third troponin is normal, will d/c with increase of Imdur to 60 mg daily and make f/u appt in near future with Dr. Martinique.   Kate Sable, M.D., F.A.C.C.

## 2013-07-17 NOTE — Progress Notes (Signed)
Utilization review completed.  

## 2013-07-17 NOTE — Discharge Instructions (Signed)
***  PLEASE REMEMBER TO BRING ALL OF YOUR MEDICATIONS TO EACH OF YOUR FOLLOW-UP OFFICE VISITS.  

## 2013-07-17 NOTE — Progress Notes (Signed)
Pt provided with dc instructions and educatino. Pt verbalized understanding. Pt has no questions at this time. IV removed with tip intact. Heart monitor cleaned and returned to frotn. Dorna Bloom, RN

## 2013-07-17 NOTE — ED Notes (Signed)
Spoke with lab about add on d-dimer.

## 2013-07-17 NOTE — H&P (Signed)
CARDIOLOGY ADMISSION NOTE  Patient ID: Jody Ruiz MRN: [id number] DOB/AGE: 1940-09-05 73 y.o.  Admit date: 07/16/2013 Primary Physician   Geoffery Lyons, MD Primary Cardiologist   Dr. Martinique Chief Complaint    Chest pain  HPI:  The patient presents for evaluation of chest pain.  She has a history of CAD as described below.  Her most recent cath in October demonstrated a patent LAD stent.  She was at home tonight.  At rest at 8 PM she had sharp chest pain.  This is unlike her GI or cardiac pain. It radiated through to her back.  She did have some left arm pain. She had some SOB and nausea.  She did have some belching.  She took 2 NTG wihtout relief.  EMS was called.  She did have ASA x 4 in the ambulance.  Her pain did go away.  She has had no recurrence of this.  She has had no new palpitations, presyncope or syncope.  She does not have PND or orthopnea.    In the ER troponin x 1 was negative.  D dimer was elevated.  However, chest CT did not demonstrate pulmonary embolism.     Past Medical History  Diagnosis Date  . Anemia   . Hx of adenomatous colonic polyps   . CAD (coronary artery disease)     a. NSTEMI 10/13 => LHC 04/28/12:  pLAD 90%, mLAD 70-80% (long), pD1 70-80%, prox Int Br 30% (small), CFX 20%, OM1 40%, mOM2 30%, pRCA 30%, PDA 30-40%, pPLB 70%, then mid 90%, EF 65%.  PCI:  Promus DES x 2 to prox and mid LAD (3x61mm and 2.5x41mm);  b. 07/2012 Cath: LM nl, LAD patent stents, LCX 20, OM1 40p, OM2 32m, RCA 30pPDA 30-40, RPL 40p, 75m, EF 55-60% ->Cont Med Rx.  Marland Kitchen Hypertension   . Microscopic colitis   . Arthritis   . Obesity   . HLD (hyperlipidemia)     intolerant to statins  . Dyspnea     a. CP and SOB 06/2012 => Ticagrelor changed to Plavix  . Myocardial infarction 04/2012     non stemi  . Hx of echocardiogram     a. Echo 2/14:  mild LVH, EF 55-65%, Gr 1 diast dysfn, mild LAE  . OSA (obstructive sleep apnea)     on CPAP  . Internal hemorrhoids   . Idiopathic  pulmonary fibrosis   . Lung nodules     Past Surgical History  Procedure Laterality Date  . Appendectomy    . Cholecystectomy    . Ventral hernia repair    . Breast biopsy      bilateral  . Knee arthroscopy      bilateral  . Vaginal polypectomy    . Shoulder arthroscopy  2012    left  . Cardiac catheterization      x4  . Coronary stent placement  04/2012    x2    Allergies  Allergen Reactions  . Brilinta [Ticagrelor] Shortness Of Breath and Cough    fatigue  . Lisinopril Shortness Of Breath and Cough  . Statins     Muscle Weakness  . Macrodantin [Nitrofurantoin Macrocrystal] Rash  . Sulfa Antibiotics Rash   No current facility-administered medications on file prior to encounter.   Current Outpatient Prescriptions on File Prior to Encounter  Medication Sig Dispense Refill  . aspirin EC 81 MG tablet Take 81 mg by mouth daily.      . isosorbide mononitrate (IMDUR)  30 MG 24 hr tablet Take 30 mg by mouth at bedtime.       Norvasc 5 mg daily     . metoprolol succinate (TOPROL XL) 50 MG 24 hr tablet Take 1 tablet (50 mg total) by mouth daily. Take with or immediately following a meal.      . metroNIDAZOLE (FLAGYL) 250 MG tablet Take 250 mg by mouth 4 (four) times daily.       . polyvinyl alcohol (LIQUIFILM TEARS) 1.4 % ophthalmic solution Place 1 drop into both eyes as needed. For dry eyes       History   Social History  . Marital Status: Married    Spouse Name: N/A    Number of Children: 3  . Years of Education: N/A   Occupational History  . Retired Therapist, sports    Social History Main Topics  . Smoking status: Never Smoker   . Smokeless tobacco: Never Used  . Alcohol Use: No  . Drug Use: No  . Sexual Activity: Not on file   Other Topics Concern  . Not on file   Social History Narrative  . No narrative on file    Family History  Problem Relation Age of Onset  . Breast cancer Sister   . Prostate cancer Father   . Colon cancer Sister   . Colon polyps Father   .  Heart disease Mother   . Heart disease Father   . Heart disease Brother   . Heart disease Sister   . Kidney disease Father      ROS:  Diarrhea, weight loss.  As stated in the HPI and negative for all other systems.  Physical Exam: Blood pressure 141/77, pulse 74, temperature 98.1 F (36.7 C), temperature source Oral, resp. rate 18, height 5\' 6"  (1.676 m), weight 221 lb (100.245 kg), SpO2 99.00%.  GENERAL:  Well appearing HEENT:  Pupils equal round and reactive, fundi not visualized, oral mucosa unremarkable NECK:  No jugular venous distention, waveform within normal limits, carotid upstroke brisk and symmetric, no bruits, no thyromegaly LYMPHATICS:  No cervical, inguinal adenopathy LUNGS:  Clear to auscultation bilaterally BACK:  No CVA tenderness CHEST:  Unremarkable HEART:  PMI not displaced or sustained,S1 and S2 within normal limits, no S3, no S4, no clicks, no rubs, no murmurs ABD:  Flat, positive bowel sounds normal in frequency in pitch, no bruits, no rebound, no guarding, no midline pulsatile mass, no hepatomegaly, no splenomegaly EXT:  2 plus pulses throughout, no edema, no cyanosis no clubbing SKIN:  No rashes no nodules NEURO:  Cranial nerves II through XII grossly intact, motor grossly intact throughout PSYCH:  Cognitively intact, oriented to person place and time  Labs: Lab Results  Component Value Date   BUN 20 07/16/2013   Lab Results  Component Value Date   CREATININE 0.65 07/16/2013   Lab Results  Component Value Date   NA 138 07/16/2013   K 3.8 07/16/2013   CL 100 07/16/2013   CO2 23 07/16/2013   Lab Results  Component Value Date   TROPONINI <0.30 08/02/2012   Lab Results  Component Value Date   WBC 8.2 07/16/2013   HGB 13.3 07/16/2013   HCT 38.7 07/16/2013   MCV 91.5 07/16/2013   PLT 319 07/16/2013    Lab Results  Component Value Date   ALT 32 07/16/2013   AST 32 07/16/2013   ALKPHOS 74 07/16/2013   BILITOT 0.3 07/16/2013      Radiology:  CHEST CT:  Negative for pulmonary embolus or acute disease.  Scattered pulmonary nodules subcentimeter pulmonary nodules are  unchanged.    EKG:  NSR, rate 79, low voltage in the limb leads.  Diffuse nonspecific ST T wave changes.  07/17/2013  ASSESSMENT AND PLAN:    CHEST PAIN:  Her pain is atypical.  There is no objective evidence of ischemia.  She will be observed.  If enzymes are negative and repeat EKG without changes no further work up will be indicated.   HTN:  The blood pressure is at target. No change in medications is indicated. We will continue with therapeutic lifestyle changes (TLC).  SLEEP APNEA:  She would rather not use the hospital CPAP.    SignedMinus Breeding 07/17/2013, 4:46 AM

## 2013-07-17 NOTE — Discharge Summary (Signed)
Discharge Summary   Patient ID: Jody Ruiz,  MRN: [id number], DOB/AGE: 08/03/1940 73 y.o.  Admit date: 07/16/2013 Discharge date: 07/18/2013  Primary Care Provider: ARONSON,RICHARD A Primary Cardiologist: P. Martinique, MD   Discharge Diagnoses Principal Problem:   Midsternal chest pain  **No objective evidence of ischemia during this admission.  **Imdur dose increased to 60mg  daily. Active Problems:   Coronary atherosclerosis of native coronary artery   Hypertension   Obesity   Dyslipidemia   IPF (idiopathic pulmonary fibrosis)   OSA (obstructive sleep apnea)  Allergies Allergies  Allergen Reactions  . Brilinta [Ticagrelor] Shortness Of Breath and Cough    fatigue  . Lisinopril Shortness Of Breath and Cough  . Statins     Muscle Weakness  . Zetia [Ezetimibe]     Myalgias   . Macrodantin [Nitrofurantoin Macrocrystal] Rash  . Sulfa Antibiotics Rash   Procedures  CT Angio of the Chest with Contrast 1.11.2015  IMPRESSION: Negative for pulmonary embolus or acute disease.  Scattered pulmonary nodules subcentimeter pulmonary nodules are unchanged. _____________   History of Present Illness  73 y/o female with a h/o CAD status post NSTEMI in October 2013 with PCI and drug-eluting stent placement to the LAD.  She has had repeat cath x 2 since then, both times showing patency of previously placed LAD stents and otherwise non-obstructive CAD and normal LV function.  Her last catheterization was in October 2014.  She was in her usual state of health until the evening of 07/16/2013, when she was resting and developed sharp chest pain unlike her prior cardiac or GI pain, radiating through to her back and into the left arm, associated with mild dyspnea and nausea.  When pain did not subside after two sublingual nitroglycerin tabs, she called EMS.  En route to [facility], she was treated with 4 ASA, and pain resolved.  In the ED, her initial troponin was normal.  D dimer was elevateed  however CTA of the chest was negative for pulmonary embolus.  She was placed in observation.  Hospital Course  Following admission, she did have another spell of self-limiting, sharp left infrascapular pain.  Her troponin has remained negative.  In the absence of objective evidence of ischemia with relatively recent catheterization showing stable coronary anatomy, she will be discharged home this afternoon in good condition.  Her home dose of imdur has been increased to 60mg  daily.  We will arrange outpatient follow-up with Dr. Martinique in the next few weeks.  Discharge Vitals Blood pressure 133/54, pulse 69, temperature 97.6 F (36.4 C), temperature source Oral, resp. rate 18, height 5\' 6"  (1.676 m), weight 221 lb (100.245 kg), SpO2 95.00%.  Filed Weights   07/16/13 2224  Weight: 221 lb (100.245 kg)   Labs  CBC  Recent Labs  07/16/13 2235  WBC 8.2  NEUTROABS 5.2  HGB 13.3  HCT 38.7  MCV 91.5  PLT 387   Basic Metabolic Panel  Recent Labs  07/16/13 2235  NA 138  K 3.8  CL 100  CO2 23  GLUCOSE 133*  BUN 20  CREATININE 0.65  CALCIUM 9.4   Liver Function Tests  Recent Labs  07/16/13 2235  AST 32  ALT 32  ALKPHOS 74  BILITOT 0.3  PROT 8.0  ALBUMIN 3.9   Cardiac Enzymes  Recent Labs  07/17/13 1136  TROPONINI <0.30    D-Dimer  Recent Labs  07/16/13 2359  DDIMER 0.72*   Disposition  Pt is being discharged home today in good  condition.  Follow-up Plans & Appointments      Follow-up Information   Follow up with ARONSON,RICHARD A, MD. (as scheduled.)    Specialty:  Internal Medicine   Contact information:   [place] [place] [address] [phone]       Follow up with Peter Martinique, MD. (3-4 wks - we will arrange and contact you.)    Specialty:  Cardiology   Contact information:   [address] [place] [address] [phone]      Discharge Medications    Medication List      STOP taking these medications       metroNIDAZOLE 250 MG tablet  Commonly known as:  FLAGYL      TAKE these medications       amLODipine 5 MG tablet  Commonly known as:  NORVASC  Take 5 mg by mouth daily.     aspirin EC 81 MG tablet  Take 81 mg by mouth daily.     isosorbide mononitrate 60 MG 24 hr tablet  Commonly known as:  IMDUR  Take 1 tablet (60 mg total) by mouth at bedtime.     nitroGLYCERIN 0.4 MG SL tablet  Commonly known as:  NITROSTAT  Place 0.4 mg under the tongue every 5 (five) minutes as needed for chest pain.     pantoprazole 40 MG tablet  Commonly known as:  PROTONIX  Take 40 mg by mouth daily.     polyvinyl alcohol 1.4 % ophthalmic solution  Commonly known as:  LIQUIFILM TEARS  Place 1 drop into both eyes as needed. For dry eyes     TOPROL XL 50 MG 24 hr tablet  Generic drug:  metoprolol succinate  Take 1 tablet (50 mg total) by mouth daily. Take with or immediately following a meal.      Outstanding Labs/Studies  None  Duration of Discharge Encounter   Greater than 30 minutes including physician time.  Signed, Murray Hodgkins NP 07/18/2013, 6:51 AM

## 2013-07-17 NOTE — ED Notes (Signed)
Pt began having chest pain, EDP Opitz aware. Pt on cardiac monitor.

## 2013-07-22 ENCOUNTER — Ambulatory Visit: Payer: Medicare Other | Admitting: Cardiology

## 2013-07-28 DIAGNOSIS — E785 Hyperlipidemia, unspecified: Secondary | ICD-10-CM | POA: Diagnosis not present

## 2013-07-28 DIAGNOSIS — I10 Essential (primary) hypertension: Secondary | ICD-10-CM | POA: Diagnosis not present

## 2013-07-28 DIAGNOSIS — R7301 Impaired fasting glucose: Secondary | ICD-10-CM | POA: Diagnosis not present

## 2013-07-28 DIAGNOSIS — R809 Proteinuria, unspecified: Secondary | ICD-10-CM | POA: Diagnosis not present

## 2013-07-28 DIAGNOSIS — E039 Hypothyroidism, unspecified: Secondary | ICD-10-CM | POA: Diagnosis not present

## 2013-08-04 DIAGNOSIS — E039 Hypothyroidism, unspecified: Secondary | ICD-10-CM | POA: Diagnosis not present

## 2013-08-04 DIAGNOSIS — I10 Essential (primary) hypertension: Secondary | ICD-10-CM | POA: Diagnosis not present

## 2013-08-04 DIAGNOSIS — Z1331 Encounter for screening for depression: Secondary | ICD-10-CM | POA: Diagnosis not present

## 2013-08-04 DIAGNOSIS — Q619 Cystic kidney disease, unspecified: Secondary | ICD-10-CM | POA: Diagnosis not present

## 2013-08-04 DIAGNOSIS — E669 Obesity, unspecified: Secondary | ICD-10-CM | POA: Diagnosis not present

## 2013-08-04 DIAGNOSIS — Z0000 Encounter for general adult medical examination without abnormal findings: Secondary | ICD-10-CM | POA: Diagnosis not present

## 2013-08-04 DIAGNOSIS — I2510 Atherosclerotic heart disease of native coronary artery without angina pectoris: Secondary | ICD-10-CM | POA: Diagnosis not present

## 2013-08-04 DIAGNOSIS — E785 Hyperlipidemia, unspecified: Secondary | ICD-10-CM | POA: Diagnosis not present

## 2013-08-05 ENCOUNTER — Encounter: Payer: Medicare Other | Admitting: Cardiology

## 2013-08-09 ENCOUNTER — Telehealth: Payer: Self-pay

## 2013-08-09 ENCOUNTER — Encounter: Payer: Self-pay | Admitting: Cardiology

## 2013-08-09 ENCOUNTER — Ambulatory Visit (INDEPENDENT_AMBULATORY_CARE_PROVIDER_SITE_OTHER): Payer: Medicare Other | Admitting: Cardiology

## 2013-08-09 VITALS — BP 138/53 | HR 81 | Ht 66.0 in | Wt 220.0 lb

## 2013-08-09 DIAGNOSIS — E785 Hyperlipidemia, unspecified: Secondary | ICD-10-CM | POA: Diagnosis not present

## 2013-08-09 DIAGNOSIS — I10 Essential (primary) hypertension: Secondary | ICD-10-CM | POA: Diagnosis not present

## 2013-08-09 DIAGNOSIS — J84112 Idiopathic pulmonary fibrosis: Secondary | ICD-10-CM | POA: Diagnosis not present

## 2013-08-09 DIAGNOSIS — I2510 Atherosclerotic heart disease of native coronary artery without angina pectoris: Secondary | ICD-10-CM

## 2013-08-09 NOTE — Telephone Encounter (Signed)
03/31/13 Myoview report faxed to Dr.Aronson's office at fax # [phone].

## 2013-08-09 NOTE — Patient Instructions (Signed)
Continue your current therapy   I will see you in 3 months. 

## 2013-08-09 NOTE — Progress Notes (Signed)
Jody Ruiz Date of Birth: 1941-03-22 Medical Record #[id number]  History of Present Illness: Jody Ruiz is seen today for followup.She had a non-ST elevation myocardial infarction in October 2013. She had stenting of tandem lesions in the proximal and mid LAD with a 3.0 x 16 mm and 2.5 x 28 mm Promus stents respectively. In January this year she presented with some recurrent symptoms then repeat cardiac catheterization showed continued patency. In September 2014 she had a Myoview study was suggested some apical ischemia. Repeat cardiac catheterization continued to show stent patency.  In December she saw Dr. Henrene Pastor with complaints of diarrhea, weight loss and abdominal pain. She was started on Flagyl for colitis. In early January She developed sharp pain beneath her left breast radiating down her left arm and into her back. She was placed on observation at the hospital. Troponin was negative and Ecg was unchanged. D-dimer was mildly elevated but CTA of chest was negative for PE. Imdur dose was increased and fortunately her chest pain resolved. Her Flagyl was discontinued. This year she has been diagnosed with idiopathic pulmonary fibrosis. She's also on CPAP therapy for obstructive sleep apnea. This is followed by Dr. Elsworth Soho.  Current Outpatient Prescriptions on File Prior to Visit  Medication Sig Dispense Refill  . amLODipine (NORVASC) 5 MG tablet Take 5 mg by mouth daily.      Marland Kitchen aspirin EC 81 MG tablet Take 81 mg by mouth daily.      . isosorbide mononitrate (IMDUR) 60 MG 24 hr tablet Take 1 tablet (60 mg total) by mouth at bedtime.  30 tablet  6  . metoprolol succinate (TOPROL XL) 50 MG 24 hr tablet Take 1 tablet (50 mg total) by mouth daily. Take with or immediately following a meal.      . nitroGLYCERIN (NITROSTAT) 0.4 MG SL tablet Place 0.4 mg under the tongue every 5 (five) minutes as needed for chest pain.      . pantoprazole (PROTONIX) 40 MG tablet Take 40 mg by mouth daily.      .  polyvinyl alcohol (LIQUIFILM TEARS) 1.4 % ophthalmic solution Place 1 drop into both eyes as needed. For dry eyes       No current facility-administered medications on file prior to visit.    Allergies  Allergen Reactions  . Brilinta [Ticagrelor] Shortness Of Breath and Cough    fatigue  . Lisinopril Shortness Of Breath and Cough  . Statins     Muscle Weakness  . Zetia [Ezetimibe]     Myalgias   . Macrodantin [Nitrofurantoin Macrocrystal] Rash  . Sulfa Antibiotics Rash    Past Medical History  Diagnosis Date  . Anemia   . Hx of adenomatous colonic polyps   . CAD (coronary artery disease)     a. 04/2012 NSTEMI/Cath:  pLAD 90%, mLAD 70-80% (long) (3x16 & 2.5x28 Promus DES to p/m LAD), pD1 70-80%, pRI 30% (small), CFX 20%, OM1 40%, mOM2 30%, pRCA 30%, PDA 30-40%, pPLB 70%, then mid 90%, EF 65%;  b. 03/2013 Abnl CL w/apical isch;  c. 04/2013 Cath: LM nl, LAD patent stents, LCX <20, RCA 30p PDA 40-50 EF 55-60% ->Cont Med Rx.  Marland Kitchen Hypertension   . Microscopic colitis   . Arthritis   . Obesity   . HLD (hyperlipidemia)     intolerant to statins  . Dyspnea     a. CP and SOB 06/2012 => Ticagrelor changed to Plavix  . Hx of echocardiogram     a.  Echo 2/14:  mild LVH, EF 55-65%, Gr 1 diast dysfn, mild LAE  . OSA (obstructive sleep apnea)     a. on CPAP  . Internal hemorrhoids   . Idiopathic pulmonary fibrosis   . Lung nodules     Past Surgical History  Procedure Laterality Date  . Appendectomy    . Cholecystectomy    . Ventral hernia repair    . Breast biopsy      bilateral  . Knee arthroscopy      bilateral  . Vaginal polypectomy    . Shoulder arthroscopy  2012    left  . Cardiac catheterization      x4  . Coronary stent placement  04/2012    x2    History  Smoking status  . Never Smoker   Smokeless tobacco  . Never Used    History  Alcohol Use No    Family History  Problem Relation Age of Onset  . Breast cancer Sister   . Prostate cancer Father   . Colon  cancer Sister   . Colon polyps Father   . Heart disease Mother 71    Died acute MI  . Heart disease Father 12    CAD, died of ESRD  . Heart disease Brother 1    CABG  . Kidney disease Father     Review of Systems: As noted in history of present illness.  All other systems were reviewed and are negative.  Physical Exam: BP 138/53  Pulse 81  Ht 5\' 6"  (1.676 m)  Wt 220 lb (99.791 kg)  BMI 35.53 kg/m2 She is a pleasant white female in no acute distress. HEENT: Normal No JVD or bruits. Lungs:  few crackles Cardiovascular: Regular rate and rhythm. Normal S1 and S2. No gallop, murmur, or click. Abdomen: Soft and nontender. No hepatosplenomegaly. Bowel sounds are positive. Extremities: No edema. Pulses are 2+ and symmetric. Skin: Warm and dry Neuro: Alert and oriented x3. Cranial nerves II through XII are intact. LABORATORY DATA:  Lab Results  Component Value Date   WBC 8.2 07/16/2013   HGB 13.3 07/16/2013   HCT 38.7 07/16/2013   PLT 319 07/16/2013   GLUCOSE 133* 07/16/2013   CHOL 165 04/29/2012   TRIG 132 04/29/2012   HDL 43 04/29/2012   LDLCALC 96 04/29/2012   ALT 32 07/16/2013   AST 32 07/16/2013   NA 138 07/16/2013   K 3.8 07/16/2013   CL 100 07/16/2013   CREATININE 0.65 07/16/2013   BUN 20 07/16/2013   CO2 23 07/16/2013   INR 1.1* 04/11/2013   From Dr. Jacquiline Doe office 07/28/13: glucose 115. All other chemistries and CBC are normal. Total chol-180, trig-126, LDL-123, HDL 32. TSH 5.0. A1c 6.1%.  Cardiology Nuclear Med Study  Jody Ruiz is a 73 y.o. female MRN : [id number] DOB: 03/06/1941  Procedure Date: 03/31/2013  Nuclear Med Background  Indication for Stress Test: Evaluation for Ischemia and Stent Patency  History: '04 [id number]:[id number], antero-septal and lateral ischemia, EF=65%; '13 NSTEMI>Stent x 2; 1/14 Cath:patent Stents with n/o CAD, EF=55-40%; 2/14 Echo:EF=65%  Cardiac Risk Factors:Strong Family History - CAD, Hypertension and Lipids  Symptoms: Chest Pressure.  (last episode of chest discomfort is now, 5/10, CP is constant with a lot of belching), DOE, Fatigue and Nausea  Nuclear Pre-Procedure  Caffeine/Decaff Intake: None > 12 hrs  NPO After: 9:30pm   Lungs: (R) lower rhonchi.  O2 Sat: 96% on room air.  IV 0.9% NS with Angio Cath: 22g  IV Site: R Forearm x 1, tolerated well  IV Started by: Irven Baltimore, RN   Chest Size (in): 42  Cup Size: C   Height: 5\' 8"  (1.727 m)  Weight: 222 lb (100.699 kg)   BMI: Body mass index is 33.76 kg/(m^2).  Tech Comments: Toprol held x 24 hours; Amlodipine taken this am   Nuclear Med Study  1 or 2 day study: 1 day  Stress Test Type: Carlton Adam   Reading MD: Mertie Moores, MD  Order Authorizing Provider: Peter Martinique, MD, and Ermalinda Barrios, C[facility]   Resting Radionuclide: Technetium 73m Sestamibi  Resting Radionuclide Dose: 10.7 mCi   Stress Radionuclide: Technetium 63m Sestamibi  Stress Radionuclide Dose: 33.0 mCi   Stress Protocol  Rest HR: 61  Stress HR: 130   Rest BP: 118/63  Stress BP: 173/66   Exercise Time (min): 2:00  METS: n/a   Predicted Max HR: 150 bpm  % Max HR: 86.67 bpm  Rate Pressure Product: 22490  Dose of Adenosine (mg): n/a  Dose of Lexiscan: 0.4 mg   Dose of Atropine (mg): n/a  Dose of Dobutamine: n/a mcg/kg/min (at max HR)   Stress Test Technologist: Letta Moynahan, CMA-N  Nuclear Technologist: Charlton Amor, CNMT   Rest Procedure: Myocardial perfusion imaging was performed at rest 45 minutes following the intravenous administration of Technetium 33m Sestamibi.  Rest ECG: NSR - Normal EKG  Stress Procedure: The patient received IV Lexiscan 0.4 mg over 15-seconds with concurrent low level exercise and then Technetium 51m Sestamibi was injected at 30-seconds while the patient continued walking one more minute. She c/o nausea, lightheadedness, headache and had a lot of belching with Lexiscan. Occasional PVC's were noted. Quantitative spect images were obtained after a 45-minute delay.  Stress ECG: No  significant change from baseline ECG  QPS  Raw Data Images: Normal; no motion artifact; normal heart/lung ratio.  Stress Images: There is a small, moderately severe area of attenuation in the apex. Marland Kitchen  Rest Images: There is a small area of very mild attenuation of the apex with normal uptake in the remaining LV.  Subtraction (SDS): There is evidence of a small - medium sized area of mild ischemia. SDS = 6  Transient Ischemic Dilatation (Normal <1.22): n/a  Lung/Heart Ratio (Normal <0.45): 0.46  Quantitative Gated Spect Images  QGS EDV: 82 ml  QGS ESV: 22 ml  Impression  Exercise Capacity: Lexiscan with low level exercise.  BP Response: Normal blood pressure response.  Clinical Symptoms: No significant symptoms noted.  ECG Impression: No significant ST segment change suggestive of ischemia.  Comparison with Prior Nuclear Study: No images to compare  Overall Impression: Intermediate risk stress nuclear study . There is evidence of moderate ischemia of the apex. .  LV Ejection Fraction: 74%. LV Wall Motion: NL LV Function; NL Wall Motion. The apex contracts vigorously.  Thayer Headings, Brooke Bonito., MD, [facility]  03/31/2013, 7:25 PM  Cardiac Catheterization Procedure Note  Name: Jody Ruiz  MRN: [id number]  DOB: 1941-01-24  Procedure: Left Heart Cath, Selective Coronary Angiography, LV angiography  Indication: 73 yo WF with history of CAD s/p stenting of the proximal and mid LAD in October 2013 with DES presents with symptoms of atypical chest pain. Myoview study shows a small apical area of ischemia. She continues to have pain despite optimal medical therapy.  Procedural Details: The right wrist was prepped, draped, and anesthetized with 1% lidocaine. Using the modified Seldinger technique, a 5 French sheath was introduced into the  right radial artery. 3 mg of verapamil was administered through the sheath, weight-based unfractionated heparin was administered intravenously. Standard Judkins  catheters were used for selective coronary angiography and left ventriculography. Catheter exchanges were performed over an exchange length guidewire. There were no immediate procedural complications. A TR band was used for radial hemostasis at the completion of the procedure. The patient was transferred to the post catheterization recovery area for further monitoring.  Procedural Findings:  Hemodynamics:  AO 171/74 mean 117 mmHg  LV 176/21 mm Hg  Coronary angiography:  Coronary dominance: right  Left mainstem: Normal  Left anterior descending (LAD): the stents in the proximal and mid LAD are widely patent. There is 30% disease distally.  Left circumflex (LCx): Diffuse irregularities less than 20%..  Right coronary artery (RCA): 40-50% mid PDA. Otherwise no significant disease.  Left ventriculography: Left ventricular systolic function is normal, LVEF is estimated at 55-65%, there is no significant mitral regurgitation  Final Conclusions:  1. Nonobstructive CAD, patent LAD stents.  2. Normal LV function.  Recommendations: Medical management. Consider GI source of chest pain.  Collier Salina [facility]  04/12/2013, 10:52 AM   Assessment / Plan: 1. Coronary disease. Status post non-ST elevation myocardial infarction October 2013. Status post stenting of tandem lesions in the proximal and mid LAD. Repeat cardiac catheterization in January and October of 2014 showed continued stent patency. Patient has developed recent symptoms of chest pain with atypical features. Clinically she is much improved now. On higher dose of Imdur.  I doubt her symptoms in January were cardiac. May have been associated with GI issues given diarrhea and abdominal pain. Continue her other therapy. I will follow up in 3 months.  2. Dyslipidemia. She is intolerant to statins. Continue Zetia.  3. Idiopathic pulmonary fibrosis  4. Obstructive sleep apnea.  5. Mild hypothyroidism.

## 2013-08-18 DIAGNOSIS — H35039 Hypertensive retinopathy, unspecified eye: Secondary | ICD-10-CM | POA: Diagnosis not present

## 2013-08-18 DIAGNOSIS — H2510 Age-related nuclear cataract, unspecified eye: Secondary | ICD-10-CM | POA: Diagnosis not present

## 2013-08-18 DIAGNOSIS — H04129 Dry eye syndrome of unspecified lacrimal gland: Secondary | ICD-10-CM | POA: Diagnosis not present

## 2013-08-18 DIAGNOSIS — H524 Presbyopia: Secondary | ICD-10-CM | POA: Diagnosis not present

## 2013-09-12 ENCOUNTER — Other Ambulatory Visit: Payer: Self-pay | Admitting: Cardiology

## 2013-10-18 ENCOUNTER — Ambulatory Visit (INDEPENDENT_AMBULATORY_CARE_PROVIDER_SITE_OTHER): Payer: Medicare Other | Admitting: Cardiology

## 2013-10-18 ENCOUNTER — Encounter: Payer: Self-pay | Admitting: Cardiology

## 2013-10-18 VITALS — BP 126/68 | HR 58 | Ht 66.0 in | Wt 217.1 lb

## 2013-10-18 DIAGNOSIS — I10 Essential (primary) hypertension: Secondary | ICD-10-CM | POA: Diagnosis not present

## 2013-10-18 DIAGNOSIS — E785 Hyperlipidemia, unspecified: Secondary | ICD-10-CM | POA: Diagnosis not present

## 2013-10-18 DIAGNOSIS — I2510 Atherosclerotic heart disease of native coronary artery without angina pectoris: Secondary | ICD-10-CM | POA: Diagnosis not present

## 2013-10-18 NOTE — Progress Notes (Signed)
[name] Date of Birth: 08-21-1940 Medical Record #[id number]  History of Present Illness: Jody Ruiz is seen today for followup.She had a non-ST elevation myocardial infarction in October 2013. She had stenting of tandem lesions in the proximal and mid LAD with a 3.0 x 16 mm and 2.5 x 28 mm Promus stents respectively. In January 2014 she presented with some recurrent symptoms then repeat cardiac catheterization showed continued patency. In September 2014 she had a Myoview study was suggested some apical ischemia. Repeat cardiac catheterization continued to show stent patency.  In early January 2015 she developed sharp pain beneath her left breast radiating down her left arm and into her back. She was placed on observation at the hospital. Troponin was negative and Ecg was unchanged. D-dimer was mildly elevated but CTA of chest was negative for PE. Imdur dose was increased and fortunately her chest pain resolved.  This year she has been diagnosed with idiopathic pulmonary fibrosis. She's also on CPAP therapy for obstructive sleep apnea. This is followed by Dr. Elsworth Soho.  On follow up today she reports she is doing very well. No chest pain or SOB. Watching her diet better and has lost 3 lbs. No edema.  Current Outpatient Prescriptions on File Prior to Visit  Medication Sig Dispense Refill  . amLODipine (NORVASC) 5 MG tablet TAKE 1 TABLET ONCE A DAY  30 tablet  1  . aspirin EC 81 MG tablet Take 81 mg by mouth daily.      . isosorbide mononitrate (IMDUR) 60 MG 24 hr tablet Take 1 tablet (60 mg total) by mouth at bedtime.  30 tablet  6  . metoprolol succinate (TOPROL XL) 50 MG 24 hr tablet Take 1 tablet (50 mg total) by mouth daily. Take with or immediately following a meal.      . nitroGLYCERIN (NITROSTAT) 0.4 MG SL tablet Place 0.4 mg under the tongue every 5 (five) minutes as needed for chest pain.      . polyvinyl alcohol (LIQUIFILM TEARS) 1.4 % ophthalmic solution Place 1 drop into both  eyes as needed. For dry eyes       No current facility-administered medications on file prior to visit.    Allergies  Allergen Reactions  . Brilinta [Ticagrelor] Shortness Of Breath and Cough    fatigue  . Lisinopril Shortness Of Breath and Cough  . Statins     Muscle Weakness  . Zetia [Ezetimibe]     Myalgias   . Macrodantin [Nitrofurantoin Macrocrystal] Rash  . Sulfa Antibiotics Rash    Past Medical History  Diagnosis Date  . Anemia   . Hx of adenomatous colonic polyps   . CAD (coronary artery disease)     a. 04/2012 NSTEMI/Cath:  pLAD 90%, mLAD 70-80% (long) (3x16 & 2.5x28 Promus DES to p/m LAD), pD1 70-80%, pRI 30% (small), CFX 20%, OM1 40%, mOM2 30%, pRCA 30%, PDA 30-40%, pPLB 70%, then mid 90%, EF 65%;  b. 03/2013 Abnl CL w/apical isch;  c. 04/2013 Cath: LM nl, LAD patent stents, LCX <20, RCA 30p PDA 40-50 EF 55-60% ->Cont Med Rx.  Marland Kitchen Hypertension   . Microscopic colitis   . Arthritis   . Obesity   . HLD (hyperlipidemia)     intolerant to statins  . Dyspnea     a. CP and SOB 06/2012 => Ticagrelor changed to Plavix  . Hx of echocardiogram     a. Echo 2/14:  mild LVH, EF 55-65%, Gr 1 diast dysfn, mild LAE  .  OSA (obstructive sleep apnea)     a. on CPAP  . Internal hemorrhoids   . Idiopathic pulmonary fibrosis   . Lung nodules     Past Surgical History  Procedure Laterality Date  . Appendectomy    . Cholecystectomy    . Ventral hernia repair    . Breast biopsy      bilateral  . Knee arthroscopy      bilateral  . Vaginal polypectomy    . Shoulder arthroscopy  2012    left  . Cardiac catheterization      x4  . Coronary stent placement  04/2012    x2    History  Smoking status  . Never Smoker   Smokeless tobacco  . Never Used    History  Alcohol Use No    Family History  Problem Relation Age of Onset  . Breast cancer Sister   . Prostate cancer Father   . Colon cancer Sister   . Colon polyps Father   . Heart disease Mother 47    Died acute MI   . Heart disease Father 44    CAD, died of ESRD  . Heart disease Brother 76    CABG  . Kidney disease Father     Review of Systems: As noted in history of present illness.  All other systems were reviewed and are negative.  Physical Exam: BP 126/68  Pulse 58  Ht 5\' 6"  (1.676 m)  Wt 217 lb 1.9 oz (98.485 kg)  BMI 35.06 kg/m2  SpO2 98% She is a pleasant white female in no acute distress. HEENT: Normal No JVD or bruits. Lungs: fairly clear Cardiovascular: Regular rate and rhythm. Normal S1 and S2. No gallop, murmur, or click. Abdomen: Soft and nontender. No hepatosplenomegaly. Bowel sounds are positive. Extremities: No edema. Pulses are 2+ and symmetric. Skin: Warm and dry Neuro: Alert and oriented x3. Cranial nerves II through XII are intact. LABORATORY DATA:  Lab Results  Component Value Date   WBC 8.2 07/16/2013   HGB 13.3 07/16/2013   HCT 38.7 07/16/2013   PLT 319 07/16/2013   GLUCOSE 133* 07/16/2013   CHOL 165 04/29/2012   TRIG 132 04/29/2012   HDL 43 04/29/2012   LDLCALC 96 04/29/2012   ALT 32 07/16/2013   AST 32 07/16/2013   NA 138 07/16/2013   K 3.8 07/16/2013   CL 100 07/16/2013   CREATININE 0.65 07/16/2013   BUN 20 07/16/2013   CO2 23 07/16/2013   INR 1.1* 04/11/2013    Assessment / Plan: 1. Coronary disease. Status post non-ST elevation myocardial infarction October 2013. Status post stenting of tandem lesions in the proximal and mid LAD. Repeat cardiac catheterization in January and October of 2014 showed continued stent patency. Clinically asymptomatic now on metoprolol, norvasc, and Imdur. Continue current therapy and follow up 6 months. Encourage increased aerobic activity.  2. Dyslipidemia. She is intolerant to statins. Continue Zetia.  3. Idiopathic pulmonary fibrosis  4. Obstructive sleep apnea.  5. Mild hypothyroidism.

## 2013-10-18 NOTE — Patient Instructions (Signed)
Continue your current therapy  I will see you in 6 months.   

## 2013-11-11 ENCOUNTER — Other Ambulatory Visit: Payer: Self-pay | Admitting: Cardiology

## 2013-12-09 ENCOUNTER — Telehealth: Payer: Self-pay | Admitting: Cardiology

## 2013-12-09 NOTE — Telephone Encounter (Signed)
Returned call to patient she stated she was confused on her next appointment with Dr.Jordan.Advised she is in recalls for 10/15.Advised to call in August to schedule October appointment with Dr.Jordan.

## 2013-12-09 NOTE — Telephone Encounter (Signed)
New message           Pt would like for cheryl to call her back about a f/u appt

## 2013-12-15 ENCOUNTER — Encounter: Payer: Self-pay | Admitting: Adult Health

## 2013-12-15 ENCOUNTER — Ambulatory Visit (INDEPENDENT_AMBULATORY_CARE_PROVIDER_SITE_OTHER): Payer: Medicare Other | Admitting: Adult Health

## 2013-12-15 VITALS — BP 122/68 | HR 77 | Temp 98.0°F | Ht 67.0 in | Wt 216.6 lb

## 2013-12-15 DIAGNOSIS — R911 Solitary pulmonary nodule: Secondary | ICD-10-CM

## 2013-12-15 DIAGNOSIS — N63 Unspecified lump in unspecified breast: Secondary | ICD-10-CM | POA: Diagnosis not present

## 2013-12-15 DIAGNOSIS — Z803 Family history of malignant neoplasm of breast: Secondary | ICD-10-CM | POA: Diagnosis not present

## 2013-12-15 DIAGNOSIS — G4733 Obstructive sleep apnea (adult) (pediatric): Secondary | ICD-10-CM | POA: Diagnosis not present

## 2013-12-15 DIAGNOSIS — J84112 Idiopathic pulmonary fibrosis: Secondary | ICD-10-CM | POA: Diagnosis not present

## 2013-12-15 DIAGNOSIS — I2510 Atherosclerotic heart disease of native coronary artery without angina pectoris: Secondary | ICD-10-CM | POA: Diagnosis not present

## 2013-12-15 DIAGNOSIS — N6489 Other specified disorders of breast: Secondary | ICD-10-CM | POA: Diagnosis not present

## 2013-12-15 NOTE — Patient Instructions (Signed)
Wear CPAP every night .  Work on weight loss .  CT chest in January 2016  Follow up for mammogram today as planned  Please contact office for sooner follow up if symptoms do not improve or worsen or seek emergency care  Follow up Dr. Elsworth Soho  In 6 months and As needed

## 2013-12-20 NOTE — Addendum Note (Signed)
Addended by: Parke Poisson E on: 12/20/2013 05:28 PM   Modules accepted: Orders

## 2013-12-20 NOTE — Assessment & Plan Note (Signed)
Wear CPAP every night .  Work on weight loss .  Follow up Dr. Elsworth Soho  In 6 months and As needed

## 2013-12-20 NOTE — Assessment & Plan Note (Signed)
CT chest in January 2016  Please contact office for sooner follow up if symptoms do not improve or worsen or seek emergency care  Follow up Dr. Elsworth Soho  In 6 months and As needed

## 2013-12-20 NOTE — Assessment & Plan Note (Signed)
CT chest 07/2013 >Scattered pulmonary nodules subcentimeter pulmonary nodules are  unchanged.  Check CT chest in 6 months

## 2013-12-20 NOTE — Progress Notes (Signed)
Subjective:    Patient ID: Jody Ruiz, female    DOB: 11/20/1940, 73 y.o.   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  Initially referred 08/26/2012 to pulmonary clinic by Dr Lia Foyer for abn cxr & new onset sob Jan 2014 - thought to be pulmonary fibrosis  She had LAD stent in 10/13 & rpt cath 1/14 showed Patent stent  She underwent following workup for dyspnea :  No ambulatory desats  Elevated rt hemi-D due to eventration, Neg sniff test , Neg PE on CT  Nml EF, grade 1 diastolic on echo  nml bnp  CT chest showed Subpleural fibrosis at the lung bases & sub -sm nodles Rt fissure, largest 66mm  PSG showed AHI 66/h with predominant hypopneas & nadir desatn of 79% corrected by CPAP 11 cm, with med full face mask.  Download 5/14 on 12 cm showed residual 9/h, good compliance, small leak   For bloating ,changed to auto--> Download on auto 5-11 02/04/13 >> residual 7/h   Good usage, min leak , bloating improved     83m FU Underwent rpt cath -patent LAD stents, nml LV fn Referred to GI  Henrene Pastor)- put on flagyl -since had received 3 rounds of abx fro UTI, then bronchitis Was not using CPAP for a while (while above symptoms) back on it now -no bloating  Dyspnea stable, CT chest  02/2013 >> Nodules are stable Fibrosis slight worse than feb 2014  TLC 5.03 -stable FVC 3.3--> 3.07 (06/2013) DLCO 15.7 --> 18.6 - 68% , improved  12/15/13 Follow up OSA/IPF  Returns for  6 month IPF follow up Reports some increased DOE since last ov with dry cough at times.  Overall breathing is okay .  CT chest 07/2013 >Scattered pulmonary nodules subcentimeter pulmonary nodules are  unchanged.   Denies any wheezing, tightness, congestion, f/c/s, hemoptysis, nausea, vomiting, PND, leg swelling. Says she has dx mammogram today r/t breast nodule .    Past Medical History  Diagnosis Date  . Anemia   . Hx of adenomatous colonic polyps   . CAD (coronary artery disease)      a. 04/2012 NSTEMI/Cath:  pLAD 90%, mLAD 70-80% (long) (3x16 & 2.5x28 Promus DES to p/m LAD), pD1 70-80%, pRI 30% (small), CFX 20%, OM1 40%, mOM2 30%, pRCA 30%, PDA 30-40%, pPLB 70%, then mid 90%, EF 65%;  b. 03/2013 Abnl CL w/apical isch;  c. 04/2013 Cath: LM nl, LAD patent stents, LCX <20, RCA 30p PDA 40-50 EF 55-60% ->Cont Med Rx.  Marland Kitchen Hypertension   . Microscopic colitis   . Arthritis   . Obesity   . HLD (hyperlipidemia)     intolerant to statins  . Dyspnea     a. CP and SOB 06/2012 => Ticagrelor changed to Plavix  . Hx of echocardiogram     a. Echo 2/14:  mild LVH, EF 55-65%, Gr 1 diast dysfn, mild LAE  . OSA (obstructive sleep apnea)     a. on CPAP  . Internal hemorrhoids   . Idiopathic pulmonary fibrosis   . Lung nodules      Review of Systems  neg for any significant sore throat, dysphagia, itching, sneezing, nasal congestion or excess/ purulent secretions, fever, chills, sweats, unintended wt loss, pleuritic or exertional cp, hempoptysis, orthopnea pnd or change in chronic leg swelling. Also denies presyncope, palpitations, heartburn, abdominal pain, nausea, vomiting, diarrhea or change in bowel or urinary habits, dysuria,hematuria,  rash, arthralgias, visual complaints, headache, numbness weakness or ataxia.     Objective:   Physical Exam   Gen. Pleasant, obese, in no distress, normal affect ENT - no lesions, no post nasal drip, class 2-3 airway Neck: No JVD, no thyromegaly, no carotid bruits Lungs: no use of accessory muscles, no dullness to percussion, bibasal rales, no rhonchi  Cardiovascular: Rhythm regular, heart sounds  normal, no murmurs or gallops, no peripheral edema Abdomen: soft and non-tender, no hepatosplenomegaly, BS normal. Musculoskeletal: No deformities, no cyanosis or clubbing Neuro:  alert, non focal, no tremors   CT chest 07/2013 >Scattered pulmonary nodules subcentimeter pulmonary nodules are  unchanged.      Assessment & Plan:

## 2014-01-03 ENCOUNTER — Other Ambulatory Visit: Payer: Self-pay | Admitting: Radiology

## 2014-01-03 DIAGNOSIS — N641 Fat necrosis of breast: Secondary | ICD-10-CM | POA: Diagnosis not present

## 2014-01-03 DIAGNOSIS — N63 Unspecified lump in unspecified breast: Secondary | ICD-10-CM | POA: Diagnosis not present

## 2014-01-03 DIAGNOSIS — D249 Benign neoplasm of unspecified breast: Secondary | ICD-10-CM | POA: Diagnosis not present

## 2014-01-03 DIAGNOSIS — Z803 Family history of malignant neoplasm of breast: Secondary | ICD-10-CM | POA: Diagnosis not present

## 2014-02-08 DIAGNOSIS — E785 Hyperlipidemia, unspecified: Secondary | ICD-10-CM | POA: Diagnosis not present

## 2014-02-08 DIAGNOSIS — M1990 Unspecified osteoarthritis, unspecified site: Secondary | ICD-10-CM | POA: Diagnosis not present

## 2014-02-08 DIAGNOSIS — Z6832 Body mass index (BMI) 32.0-32.9, adult: Secondary | ICD-10-CM | POA: Diagnosis not present

## 2014-02-08 DIAGNOSIS — E669 Obesity, unspecified: Secondary | ICD-10-CM | POA: Diagnosis not present

## 2014-02-08 DIAGNOSIS — I2510 Atherosclerotic heart disease of native coronary artery without angina pectoris: Secondary | ICD-10-CM | POA: Diagnosis not present

## 2014-02-08 DIAGNOSIS — E039 Hypothyroidism, unspecified: Secondary | ICD-10-CM | POA: Diagnosis not present

## 2014-02-08 DIAGNOSIS — R7301 Impaired fasting glucose: Secondary | ICD-10-CM | POA: Diagnosis not present

## 2014-02-08 DIAGNOSIS — I10 Essential (primary) hypertension: Secondary | ICD-10-CM | POA: Diagnosis not present

## 2014-02-15 DIAGNOSIS — Z803 Family history of malignant neoplasm of breast: Secondary | ICD-10-CM | POA: Diagnosis not present

## 2014-02-15 DIAGNOSIS — N63 Unspecified lump in unspecified breast: Secondary | ICD-10-CM | POA: Diagnosis not present

## 2014-02-16 ENCOUNTER — Other Ambulatory Visit: Payer: Self-pay | Admitting: Obstetrics and Gynecology

## 2014-02-16 DIAGNOSIS — Z124 Encounter for screening for malignant neoplasm of cervix: Secondary | ICD-10-CM | POA: Diagnosis not present

## 2014-02-16 DIAGNOSIS — M899 Disorder of bone, unspecified: Secondary | ICD-10-CM | POA: Diagnosis not present

## 2014-02-16 DIAGNOSIS — Z01419 Encounter for gynecological examination (general) (routine) without abnormal findings: Secondary | ICD-10-CM | POA: Diagnosis not present

## 2014-02-16 DIAGNOSIS — N951 Menopausal and female climacteric states: Secondary | ICD-10-CM | POA: Diagnosis not present

## 2014-02-16 DIAGNOSIS — M949 Disorder of cartilage, unspecified: Secondary | ICD-10-CM | POA: Diagnosis not present

## 2014-02-20 ENCOUNTER — Other Ambulatory Visit (HOSPITAL_COMMUNITY): Payer: Self-pay | Admitting: Nurse Practitioner

## 2014-02-20 LAB — CYTOLOGY - PAP

## 2014-04-17 DIAGNOSIS — J209 Acute bronchitis, unspecified: Secondary | ICD-10-CM | POA: Diagnosis not present

## 2014-04-17 DIAGNOSIS — Z6833 Body mass index (BMI) 33.0-33.9, adult: Secondary | ICD-10-CM | POA: Diagnosis not present

## 2014-04-19 ENCOUNTER — Ambulatory Visit (INDEPENDENT_AMBULATORY_CARE_PROVIDER_SITE_OTHER): Payer: Medicare Other | Admitting: Cardiology

## 2014-04-19 ENCOUNTER — Encounter: Payer: Self-pay | Admitting: Cardiology

## 2014-04-19 VITALS — BP 130/60 | HR 64 | Ht 67.0 in | Wt 221.0 lb

## 2014-04-19 DIAGNOSIS — E785 Hyperlipidemia, unspecified: Secondary | ICD-10-CM | POA: Diagnosis not present

## 2014-04-19 DIAGNOSIS — I2510 Atherosclerotic heart disease of native coronary artery without angina pectoris: Secondary | ICD-10-CM

## 2014-04-19 DIAGNOSIS — I10 Essential (primary) hypertension: Secondary | ICD-10-CM | POA: Diagnosis not present

## 2014-04-19 DIAGNOSIS — J209 Acute bronchitis, unspecified: Secondary | ICD-10-CM | POA: Diagnosis not present

## 2014-04-19 NOTE — Patient Instructions (Signed)
Continue your current therapy  I will see you in 6 months.   

## 2014-04-19 NOTE — Progress Notes (Signed)
Jody Ruiz Date of Birth: 14-Mar-1941 Medical Record #[id number]  History of Present Illness: Jody Ruiz is seen today for followup.She is s/p non-ST elevation myocardial infarction in October 2013. She had stenting of tandem lesions in the proximal and mid LAD with a 3.0 x 16 mm and 2.5 x 28 mm Promus stents respectively. In January 2014 she presented with some recurrent symptoms then repeat cardiac catheterization showed continued patency. In September 2014 she had a Myoview study was suggested some apical ischemia. Repeat cardiac catheterization continued to show stent patency.  On follow up today she reports she is being treated for bronchitis over the past week. Still has cough. No fever. Can't use CPAP due to coughing. No chest pain. No edema. Notes she has been inactive over the past few months.  Current Outpatient Prescriptions on File Prior to Visit  Medication Sig Dispense Refill  . amLODipine (NORVASC) 5 MG tablet TAKE 1 TABLET ONCE A DAY  30 tablet  5  . aspirin EC 81 MG tablet Take 81 mg by mouth daily.      . isosorbide mononitrate (IMDUR) 60 MG 24 hr tablet TAKE ONE TABLET AT BEDTIME  30 tablet  1  . metaxalone (SKELAXIN) 800 MG tablet Take 1 tablet by mouth daily as needed.      . metoprolol succinate (TOPROL XL) 50 MG 24 hr tablet Take 1 tablet (50 mg total) by mouth daily. Take with or immediately following a meal.      . nitroGLYCERIN (NITROSTAT) 0.4 MG SL tablet Place 0.4 mg under the tongue every 5 (five) minutes as needed for chest pain.      . polyvinyl alcohol (LIQUIFILM TEARS) 1.4 % ophthalmic solution Place 1 drop into both eyes as needed. For dry eyes       No current facility-administered medications on file prior to visit.    Allergies  Allergen Reactions  . Brilinta [Ticagrelor] Shortness Of Breath and Cough    fatigue  . Lisinopril Shortness Of Breath and Cough  . Statins     Muscle Weakness  . Zetia [Ezetimibe]     Myalgias   . Macrodantin  [Nitrofurantoin Macrocrystal] Rash  . Sulfa Antibiotics Rash    Past Medical History  Diagnosis Date  . Anemia   . Hx of adenomatous colonic polyps   . CAD (coronary artery disease)     a. 04/2012 NSTEMI/Cath:  pLAD 90%, mLAD 70-80% (long) (3x16 & 2.5x28 Promus DES to p/m LAD), pD1 70-80%, pRI 30% (small), CFX 20%, OM1 40%, mOM2 30%, pRCA 30%, PDA 30-40%, pPLB 70%, then mid 90%, EF 65%;  b. 03/2013 Abnl CL w/apical isch;  c. 04/2013 Cath: LM nl, LAD patent stents, LCX <20, RCA 30p PDA 40-50 EF 55-60% ->Cont Med Rx.  Marland Kitchen Hypertension   . Microscopic colitis   . Arthritis   . Obesity   . HLD (hyperlipidemia)     intolerant to statins  . Dyspnea     a. CP and SOB 06/2012 => Ticagrelor changed to Plavix  . Hx of echocardiogram     a. Echo 2/14:  mild LVH, EF 55-65%, Gr 1 diast dysfn, mild LAE  . OSA (obstructive sleep apnea)     a. on CPAP  . Internal hemorrhoids   . Idiopathic pulmonary fibrosis   . Lung nodules     Past Surgical History  Procedure Laterality Date  . Appendectomy    . Cholecystectomy    . Ventral hernia repair    .  Breast biopsy      bilateral  . Knee arthroscopy      bilateral  . Vaginal polypectomy    . Shoulder arthroscopy  2012    left  . Cardiac catheterization      x4  . Coronary stent placement  04/2012    x2    History  Smoking status  . Never Smoker   Smokeless tobacco  . Never Used    History  Alcohol Use No    Family History  Problem Relation Age of Onset  . Breast cancer Sister   . Prostate cancer Father   . Colon cancer Sister   . Colon polyps Father   . Heart disease Mother 15    Died acute MI  . Heart disease Father 18    CAD, died of ESRD  . Heart disease Brother 11    CABG  . Kidney disease Father     Review of Systems: As noted in history of present illness. She did have evaluation for a breast lump that was benign.  All other systems were reviewed and are negative.  Physical Exam: BP 130/60  Pulse 64  Ht 5\' 7"   (1.702 m)  Wt 221 lb (100.245 kg)  BMI 34.61 kg/m2 She is a pleasant white female in no acute distress. HEENT: Normal. Hoarse No JVD or bruits. Lungs: fairly clear Cardiovascular: Regular rate and rhythm. Normal S1 and S2. No gallop, murmur, or click. Abdomen: Soft and nontender. No hepatosplenomegaly. Bowel sounds are positive. Extremities: No edema. Pulses are 2+ and symmetric. Skin: Warm and dry Neuro: Alert and oriented x3. Cranial nerves II through XII are intact. LABORATORY DATA:  Lab Results  Component Value Date   WBC 8.2 07/16/2013   HGB 13.3 07/16/2013   HCT 38.7 07/16/2013   PLT 319 07/16/2013   GLUCOSE 133* 07/16/2013   CHOL 165 04/29/2012   TRIG 132 04/29/2012   HDL 43 04/29/2012   LDLCALC 96 04/29/2012   ALT 32 07/16/2013   AST 32 07/16/2013   NA 138 07/16/2013   K 3.8 07/16/2013   CL 100 07/16/2013   CREATININE 0.65 07/16/2013   BUN 20 07/16/2013   CO2 23 07/16/2013   INR 1.1* 04/11/2013    Assessment / Plan: 1. Coronary disease. Status post non-ST elevation myocardial infarction October 2013. Status post stenting of tandem lesions in the proximal and mid LAD. Repeat cardiac catheterization in January and October of 2014 showed continued stent patency. Clinically asymptomatic now on metoprolol, norvasc, and Imdur. Continue current therapy and follow up 6 months.   2. Dyslipidemia. She is intolerant to statins. Continue Zetia.  3. Idiopathic pulmonary fibrosis  4. Obstructive sleep apnea.  5. Mild hypothyroidism.  6. Acute bronchitis. Complete course of antibiotics.

## 2014-04-20 ENCOUNTER — Other Ambulatory Visit (HOSPITAL_COMMUNITY): Payer: Self-pay | Admitting: Cardiology

## 2014-04-21 ENCOUNTER — Other Ambulatory Visit: Payer: Self-pay

## 2014-04-22 DIAGNOSIS — H66012 Acute suppurative otitis media with spontaneous rupture of ear drum, left ear: Secondary | ICD-10-CM | POA: Diagnosis not present

## 2014-05-04 DIAGNOSIS — E669 Obesity, unspecified: Secondary | ICD-10-CM | POA: Diagnosis not present

## 2014-05-04 DIAGNOSIS — J209 Acute bronchitis, unspecified: Secondary | ICD-10-CM | POA: Diagnosis not present

## 2014-05-04 DIAGNOSIS — E039 Hypothyroidism, unspecified: Secondary | ICD-10-CM | POA: Diagnosis not present

## 2014-05-04 DIAGNOSIS — Z6833 Body mass index (BMI) 33.0-33.9, adult: Secondary | ICD-10-CM | POA: Diagnosis not present

## 2014-05-04 DIAGNOSIS — I2510 Atherosclerotic heart disease of native coronary artery without angina pectoris: Secondary | ICD-10-CM | POA: Diagnosis not present

## 2014-05-11 ENCOUNTER — Other Ambulatory Visit: Payer: Self-pay | Admitting: Cardiology

## 2014-05-18 ENCOUNTER — Ambulatory Visit: Payer: Medicare Other

## 2014-05-18 ENCOUNTER — Ambulatory Visit (INDEPENDENT_AMBULATORY_CARE_PROVIDER_SITE_OTHER): Payer: Medicare Other

## 2014-05-18 DIAGNOSIS — Z23 Encounter for immunization: Secondary | ICD-10-CM | POA: Diagnosis not present

## 2014-05-18 NOTE — Progress Notes (Signed)
Patient came in office with husband today she requested flu vaccine.Flu vaccine given to patient.

## 2014-05-24 DIAGNOSIS — N6001 Solitary cyst of right breast: Secondary | ICD-10-CM | POA: Diagnosis not present

## 2014-05-24 DIAGNOSIS — R928 Other abnormal and inconclusive findings on diagnostic imaging of breast: Secondary | ICD-10-CM | POA: Diagnosis not present

## 2014-05-24 DIAGNOSIS — Z09 Encounter for follow-up examination after completed treatment for conditions other than malignant neoplasm: Secondary | ICD-10-CM | POA: Diagnosis not present

## 2014-06-13 ENCOUNTER — Other Ambulatory Visit (INDEPENDENT_AMBULATORY_CARE_PROVIDER_SITE_OTHER): Payer: Medicare Other

## 2014-06-13 ENCOUNTER — Encounter: Payer: Self-pay | Admitting: Pulmonary Disease

## 2014-06-13 ENCOUNTER — Ambulatory Visit (INDEPENDENT_AMBULATORY_CARE_PROVIDER_SITE_OTHER): Payer: Medicare Other | Admitting: Pulmonary Disease

## 2014-06-13 VITALS — BP 138/82 | HR 54 | Ht 67.0 in | Wt 223.2 lb

## 2014-06-13 DIAGNOSIS — Z23 Encounter for immunization: Secondary | ICD-10-CM

## 2014-06-13 DIAGNOSIS — I2510 Atherosclerotic heart disease of native coronary artery without angina pectoris: Secondary | ICD-10-CM

## 2014-06-13 DIAGNOSIS — G4733 Obstructive sleep apnea (adult) (pediatric): Secondary | ICD-10-CM

## 2014-06-13 DIAGNOSIS — J84112 Idiopathic pulmonary fibrosis: Secondary | ICD-10-CM

## 2014-06-13 LAB — SEDIMENTATION RATE: Sed Rate: 41 mm/hr — ABNORMAL HIGH (ref 0–22)

## 2014-06-13 NOTE — Progress Notes (Signed)
   Subjective:    Patient ID: Jody Ruiz, female    DOB: 1940/10/06, 73 y.o.   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  Download 11/2012 on 12 cm showed residual 9/h, good compliance, small leak   For bloating ,changed to auto--> Download on auto 5-11 02/04/13 >> residual 7/h  Good usage, min leak , bloating improved   Dec 2014 -TLC 5.03 -stable FVC 3.3--> 3.07 (06/2013) DLCO 15.7 --> 18.6 - 68% , improved   06/13/2014  Chief Complaint  Patient presents with  . Follow-up    fatigue; cough; unable to wear CPAP (no download available); wants to resume wearing CPAP again.     Bronchitis with bad cough in oct 2015 Overall breathing is okay . Some dry cough persists She stopped using CPAP CT chest 07/2013 >Scattered pulmonary nodules subcentimeter  are unchanged.  Review of Systems neg for any significant sore throat, dysphagia, itching, sneezing, nasal congestion or excess/ purulent secretions, fever, chills, sweats, unintended wt loss, pleuritic or exertional cp, hempoptysis, orthopnea pnd or change in chronic leg swelling. Also denies presyncope, palpitations, heartburn, abdominal pain, nausea, vomiting, diarrhea or change in bowel or urinary habits, dysuria,hematuria, rash, arthralgias, visual complaints, headache, numbness weakness or ataxia.     Objective:   Physical  Exam  Gen. Pleasant, well-nourished, in no distress ENT - no lesions, no post nasal drip Neck: No JVD, no thyromegaly, no carotid bruits Lungs: no use of accessory muscles, no dullness to percussion, clear without rales or rhonchi  Cardiovascular: Rhythm regular, heart sounds  normal, no murmurs or gallops, no peripheral edema Musculoskeletal: No deformities, no cyanosis or clubbing        Assessment & Plan:

## 2014-06-13 NOTE — Patient Instructions (Addendum)
Get back on CPAP - we will check download in 1 month Breathing test & Ct scan Blood work for fibrosis-ANA, ESR, CCP We discussed new medications for fibrosis Prevnar

## 2014-06-14 LAB — ANTI-NUCLEAR AB-TITER (ANA TITER): ANA Titer 1: 1:320 {titer} — ABNORMAL HIGH

## 2014-06-14 LAB — ANA: Anti Nuclear Antibody(ANA): POSITIVE — AB

## 2014-06-14 LAB — CYCLIC CITRUL PEPTIDE ANTIBODY, IGG: Cyclic Citrullin Peptide Ab: <2 U/mL (ref 0.0–5.0)

## 2014-06-14 NOTE — Assessment & Plan Note (Signed)
PFTs & Ct scan Blood work for fibrosis-ANA, ESR, CCP We discussed new medications for fibrosis -will consider this if there is further deterioration in lung function, likely she appears stable except for slight worsening cough-which may be post bronchitic Prevnar

## 2014-06-14 NOTE — Assessment & Plan Note (Signed)
Get back on CPAP - we will check download in 1 month

## 2014-06-15 ENCOUNTER — Encounter (HOSPITAL_COMMUNITY): Payer: Self-pay | Admitting: Cardiology

## 2014-06-15 ENCOUNTER — Other Ambulatory Visit: Payer: Self-pay | Admitting: Pulmonary Disease

## 2014-06-15 DIAGNOSIS — J84112 Idiopathic pulmonary fibrosis: Secondary | ICD-10-CM

## 2014-06-19 ENCOUNTER — Other Ambulatory Visit: Payer: Medicare Other

## 2014-06-19 ENCOUNTER — Ambulatory Visit (INDEPENDENT_AMBULATORY_CARE_PROVIDER_SITE_OTHER): Payer: Medicare Other | Admitting: Pulmonary Disease

## 2014-06-19 DIAGNOSIS — M3500 Sicca syndrome, unspecified: Secondary | ICD-10-CM

## 2014-06-19 DIAGNOSIS — J84112 Idiopathic pulmonary fibrosis: Secondary | ICD-10-CM

## 2014-06-19 DIAGNOSIS — R768 Other specified abnormal immunological findings in serum: Secondary | ICD-10-CM

## 2014-06-19 LAB — PULMONARY FUNCTION TEST
DL/VA % pred: 73 %
DL/VA: 3.72 ml/min/mmHg/L
DLCO unc % pred: 64 %
DLCO unc: 17.4 ml/min/mmHg
FEF 25-75 Post: 2.74 L/sec
FEF 25-75 Pre: 2.98 L/sec
FEF2575-%Change-Post: -8 %
FEF2575-%Pred-Post: 141 %
FEF2575-%Pred-Pre: 153 %
FEV1-%CHANGE-POST: -3 %
FEV1-%PRED-POST: 102 %
FEV1-%PRED-PRE: 106 %
FEV1-POST: 2.48 L
FEV1-Pre: 2.56 L
FEV1FVC-%CHANGE-POST: 3 %
FEV1FVC-%Pred-Pre: 111 %
FEV6-%Change-Post: -6 %
FEV6-%Pred-Post: 92 %
FEV6-%Pred-Pre: 99 %
FEV6-PRE: 3.03 L
FEV6-Post: 2.83 L
FEV6FVC-%PRED-PRE: 104 %
FEV6FVC-%Pred-Post: 104 %
FVC-%Change-Post: -6 %
FVC-%Pred-Post: 88 %
FVC-%Pred-Pre: 95 %
FVC-Post: 2.83 L
FVC-Pre: 3.03 L
POST FEV1/FVC RATIO: 87 %
POST FEV6/FVC RATIO: 100 %
PRE FEV6/FVC RATIO: 100 %
Pre FEV1/FVC ratio: 84 %
RV % pred: 80 %
RV: 1.86 L
TLC % pred: 91 %
TLC: 4.87 L

## 2014-06-19 NOTE — Progress Notes (Signed)
PFT done today. 

## 2014-06-20 LAB — SJOGRENS SYNDROME-B EXTRACTABLE NUCLEAR ANTIBODY: SSB (LA) (ENA) ANTIBODY, IGG: NEGATIVE

## 2014-06-20 LAB — ANTI-DNA ANTIBODY, DOUBLE-STRANDED: DS DNA AB: 1 [IU]/mL

## 2014-06-20 LAB — SJOGRENS SYNDROME-A EXTRACTABLE NUCLEAR ANTIBODY: SSA (Ro) (ENA) Antibody, IgG: 1.8 — ABNORMAL HIGH

## 2014-06-22 ENCOUNTER — Ambulatory Visit
Admission: RE | Admit: 2014-06-22 | Discharge: 2014-06-22 | Disposition: A | Discharge status: Home / Self Care | Payer: Self-pay | Source: Ambulatory Visit | Attending: Pulmonary Disease | Admitting: Pulmonary Disease

## 2014-06-22 DIAGNOSIS — J84112 Idiopathic pulmonary fibrosis: Secondary | ICD-10-CM

## 2014-06-22 NOTE — Addendum Note (Signed)
Addended by: Mathis Dad on: 06/22/2014 03:22 PM   Modules accepted: Orders

## 2014-06-23 ENCOUNTER — Ambulatory Visit (INDEPENDENT_AMBULATORY_CARE_PROVIDER_SITE_OTHER)
Admission: RE | Admit: 2014-06-23 | Discharge: 2014-06-23 | Disposition: A | Discharge status: Home / Self Care | Payer: Medicare Other | Source: Ambulatory Visit | Attending: Pulmonary Disease | Admitting: Pulmonary Disease

## 2014-06-23 DIAGNOSIS — J8410 Pulmonary fibrosis, unspecified: Secondary | ICD-10-CM | POA: Diagnosis not present

## 2014-06-23 DIAGNOSIS — J84112 Idiopathic pulmonary fibrosis: Secondary | ICD-10-CM

## 2014-06-23 DIAGNOSIS — I2510 Atherosclerotic heart disease of native coronary artery without angina pectoris: Secondary | ICD-10-CM | POA: Diagnosis not present

## 2014-06-28 NOTE — Progress Notes (Signed)
Quick Note:  Spoke with pt and notified of results per Dr. Alva Pt verbalized understanding and denied any questions.  ______ 

## 2014-07-03 ENCOUNTER — Other Ambulatory Visit: Payer: Self-pay | Admitting: Physician Assistant

## 2014-07-04 ENCOUNTER — Other Ambulatory Visit: Payer: Self-pay | Admitting: *Deleted

## 2014-07-04 MED ORDER — NITROGLYCERIN 0.4 MG SL SUBL: 0.4000 mg | SUBLINGUAL_TABLET | SUBLINGUAL | Status: DC | PRN

## 2014-07-10 DIAGNOSIS — M2550 Pain in unspecified joint: Secondary | ICD-10-CM | POA: Diagnosis not present

## 2014-07-10 DIAGNOSIS — J8410 Pulmonary fibrosis, unspecified: Secondary | ICD-10-CM | POA: Diagnosis not present

## 2014-07-10 DIAGNOSIS — R768 Other specified abnormal immunological findings in serum: Secondary | ICD-10-CM | POA: Diagnosis not present

## 2014-07-17 ENCOUNTER — Encounter: Payer: Self-pay | Admitting: Pulmonary Disease

## 2014-07-17 ENCOUNTER — Ambulatory Visit (INDEPENDENT_AMBULATORY_CARE_PROVIDER_SITE_OTHER): Payer: Medicare Other | Admitting: Pulmonary Disease

## 2014-07-17 ENCOUNTER — Other Ambulatory Visit: Payer: Medicare Other

## 2014-07-17 ENCOUNTER — Telehealth: Payer: Self-pay | Admitting: Pulmonary Disease

## 2014-07-17 ENCOUNTER — Ambulatory Visit (INDEPENDENT_AMBULATORY_CARE_PROVIDER_SITE_OTHER)
Admission: RE | Admit: 2014-07-17 | Discharge: 2014-07-17 | Disposition: A | Discharge status: Home / Self Care | Payer: Medicare Other | Source: Ambulatory Visit | Attending: Pulmonary Disease | Admitting: Pulmonary Disease

## 2014-07-17 VITALS — BP 150/76 | HR 71 | Temp 98.0°F | Ht 67.0 in | Wt 217.8 lb

## 2014-07-17 DIAGNOSIS — R05 Cough: Secondary | ICD-10-CM

## 2014-07-17 DIAGNOSIS — J209 Acute bronchitis, unspecified: Secondary | ICD-10-CM

## 2014-07-17 DIAGNOSIS — R059 Cough, unspecified: Secondary | ICD-10-CM

## 2014-07-17 DIAGNOSIS — R0689 Other abnormalities of breathing: Secondary | ICD-10-CM | POA: Diagnosis not present

## 2014-07-17 DIAGNOSIS — J84112 Idiopathic pulmonary fibrosis: Secondary | ICD-10-CM | POA: Diagnosis not present

## 2014-07-17 DIAGNOSIS — R0602 Shortness of breath: Secondary | ICD-10-CM | POA: Diagnosis not present

## 2014-07-17 LAB — COMPLETE METABOLIC PANEL WITH GFR
ALT: 16 U/L (ref 0–35)
AST: 16 U/L (ref 0–37)
Albumin: 3.6 g/dL (ref 3.5–5.2)
Alkaline Phosphatase: 83 U/L (ref 39–117)
BILIRUBIN TOTAL: 0.4 mg/dL (ref 0.2–1.2)
BUN: 14 mg/dL (ref 6–23)
CALCIUM: 9.2 mg/dL (ref 8.4–10.5)
CO2: 29 mEq/L (ref 19–32)
Chloride: 102 mEq/L (ref 96–112)
Creat: 0.63 mg/dL (ref 0.50–1.10)
GFR, Est African American: >89 mL/min
GFR, Est Non African American: >89 mL/min
Glucose, Bld: 105 mg/dL — ABNORMAL HIGH (ref 70–99)
Potassium: 4 mEq/L (ref 3.5–5.3)
SODIUM: 139 meq/L (ref 135–145)
Total Protein: 7 g/dL (ref 6.0–8.3)

## 2014-07-17 MED ORDER — LEVOFLOXACIN 500 MG PO TABS: 500.0000 mg | ORAL_TABLET | Freq: Every day | ORAL | Status: DC

## 2014-07-17 NOTE — Assessment & Plan Note (Signed)
Appears stable by imaging and lung function Await rheumatology input, regarding strongly positive ANA

## 2014-07-17 NOTE — Progress Notes (Signed)
   Subjective:    Patient ID: Jody Ruiz, female    DOB: 17-Jun-1941, 74 y.o.   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  Download 11/2012 on 12 cm showed residual 9/h, good compliance, small leak   For bloating ,changed to auto--> Download on auto 5-11 02/04/13 >> residual 7/h  Good usage, min leak , bloating improved   Dec 2014 -TLC 5.03 -stable FVC 3.3--> 3.07 (06/2013) DLCO 15.7 --> 18.6 - 68% , improved  CT chest 07/2013 >Scattered pulmonary nodules subcentimeter are unchanged. 06/2014 CT - unchanged  PFTs -stable  ANA 1:320, ds-DNA 1 -referred to [facility]  07/17/2014  Chief Complaint  Patient presents with  . Acute Visit    cough, fever, sob, fatigue, headache, off balance, dizzy, incontinence, weak, coughing up dark, thick yellow mucus; loss of appetite   Started with fever, cough, headache  -now yellow sputum PCP Reynaldo Minium called in ceftin Feels weak, tired, low grade T 100 last night, afebrile currently  Unable to use CPAP Daughter hosp with acute PE    Review of Systems neg for any significant sore throat, dysphagia, itching, sneezing, nasal congestion or excess/ purulent secretions, fever, chills, sweats, unintended wt loss, pleuritic or exertional cp, hempoptysis, orthopnea pnd or change in chronic leg swelling. Also denies presyncope, palpitations, heartburn,  abdominal pain, nausea, vomiting, diarrhea or change in bowel or urinary habits, dysuria,hematuria, rash, arthralgias, visual complaints, headache, numbness weakness or ataxia.     Objective:   Physical Exam  Gen. Pleasant, obese, in no distress, normal affect ENT - no lesions, no post nasal drip, class 2-3 airway Neck: No JVD, no thyromegaly, no carotid bruits Lungs: no use of accessory muscles, no dullness to percussion, bibasal rales , norhonchi  Cardiovascular: Rhythm regular, heart sounds  normal, no murmurs or gallops, no peripheral edema Abdomen: soft and non-tender, no hepatosplenomegaly, BS normal. Musculoskeletal: No deformities, no cyanosis or clubbing Neuro:  alert, non focal, no tremors       Assessment & Plan:

## 2014-07-17 NOTE — Patient Instructions (Signed)
Blood work - CXR today Levaquin 500 daily x 7 days Mucinex daytime , OK for DESLYM at night Call if no better by Thursday

## 2014-07-17 NOTE — Telephone Encounter (Signed)
Pt states that she is needing an appt with RA today d/t having increased weakness, dizziness, cough with thick yellow/green mucus, loss of appetite and fever (101-103). Pt states that her husband told her that she was "talking out of her head", confused and delirious. Pt reports having very bad night sweats and pain at base of lungs. Pt had horrible cough while talking on the phone.  Pt reports fever today is around 100 degrees.   Allergies  Allergen Reactions  . Brilinta [Ticagrelor] Shortness Of Breath and Cough    fatigue  . Lisinopril Shortness Of Breath and Cough  . Statins     Muscle Weakness  . Zetia [Ezetimibe]     Myalgias   . Macrodantin [Nitrofurantoin Macrocrystal] Rash  . Sulfa Antibiotics Rash   Pt scheduled for OV today at 12p with Dr Elsworth Soho. Pt requested OV over getting rec's from RA.

## 2014-07-17 NOTE — Assessment & Plan Note (Signed)
Blood work - CXR today does not show evidence of pneumonia She likely has a viral syndrome, with bacterial superinfection Levaquin 500 daily x 7 days Mucinex daytime , OK for DESLYM at night Call if no better by Thursday

## 2014-07-19 ENCOUNTER — Telehealth: Payer: Self-pay | Admitting: Pulmonary Disease

## 2014-07-19 NOTE — Telephone Encounter (Signed)
Notes Recorded by Rigoberto Noel, MD on 07/18/2014 at 5:49 PM Blood work ok -no dehydration, lytes ok  I spoke with patient about results and she verbalized understanding and had no questions

## 2014-07-31 ENCOUNTER — Telehealth: Payer: Self-pay | Admitting: Cardiology

## 2014-07-31 NOTE — Telephone Encounter (Signed)
Returned call to patient she stated she has been having chest pain,sob for the last 3 days.No chest pain at present.Stated she has been feeling bad no energy for the past 2 to 3 weeks.Appointment scheduled with Truitt Merle NP 08/01/14 at 11:00 am at [facility].Advised to go to ER if needed.

## 2014-07-31 NOTE — Telephone Encounter (Signed)
Pt called in wanting Malachy Mood to return her call..she did not specify that the call was in regards to .  Thanks

## 2014-08-01 ENCOUNTER — Encounter (HOSPITAL_COMMUNITY): Payer: Self-pay

## 2014-08-01 ENCOUNTER — Inpatient Hospital Stay (HOSPITAL_COMMUNITY)
Admission: EM | Admit: 2014-08-01 | Discharge: 2014-08-03 | DRG: 287 | Disposition: A | Discharge status: Home / Self Care | Payer: Medicare Other | Attending: Cardiology | Admitting: Cardiology

## 2014-08-01 ENCOUNTER — Encounter: Payer: Self-pay | Admitting: Nurse Practitioner

## 2014-08-01 ENCOUNTER — Ambulatory Visit (INDEPENDENT_AMBULATORY_CARE_PROVIDER_SITE_OTHER): Payer: Medicare Other | Admitting: Nurse Practitioner

## 2014-08-01 ENCOUNTER — Other Ambulatory Visit: Payer: Self-pay | Admitting: Nurse Practitioner

## 2014-08-01 ENCOUNTER — Observation Stay (HOSPITAL_COMMUNITY): Payer: Medicare Other

## 2014-08-01 ENCOUNTER — Emergency Department (HOSPITAL_COMMUNITY): Payer: Medicare Other

## 2014-08-01 VITALS — BP 124/76 | HR 70 | Temp 98.7°F | Ht 67.0 in | Wt 213.4 lb

## 2014-08-01 DIAGNOSIS — Z7982 Long term (current) use of aspirin: Secondary | ICD-10-CM

## 2014-08-01 DIAGNOSIS — R0600 Dyspnea, unspecified: Secondary | ICD-10-CM | POA: Diagnosis not present

## 2014-08-01 DIAGNOSIS — I209 Angina pectoris, unspecified: Secondary | ICD-10-CM

## 2014-08-01 DIAGNOSIS — Z955 Presence of coronary angioplasty implant and graft: Secondary | ICD-10-CM

## 2014-08-01 DIAGNOSIS — I2510 Atherosclerotic heart disease of native coronary artery without angina pectoris: Secondary | ICD-10-CM | POA: Diagnosis present

## 2014-08-01 DIAGNOSIS — I200 Unstable angina: Secondary | ICD-10-CM

## 2014-08-01 DIAGNOSIS — R0602 Shortness of breath: Secondary | ICD-10-CM

## 2014-08-01 DIAGNOSIS — J208 Acute bronchitis due to other specified organisms: Secondary | ICD-10-CM | POA: Diagnosis present

## 2014-08-01 DIAGNOSIS — I10 Essential (primary) hypertension: Secondary | ICD-10-CM | POA: Diagnosis present

## 2014-08-01 DIAGNOSIS — E669 Obesity, unspecified: Secondary | ICD-10-CM | POA: Diagnosis present

## 2014-08-01 DIAGNOSIS — J84112 Idiopathic pulmonary fibrosis: Secondary | ICD-10-CM | POA: Diagnosis present

## 2014-08-01 DIAGNOSIS — G4733 Obstructive sleep apnea (adult) (pediatric): Secondary | ICD-10-CM | POA: Diagnosis not present

## 2014-08-01 DIAGNOSIS — Z6833 Body mass index (BMI) 33.0-33.9, adult: Secondary | ICD-10-CM | POA: Diagnosis not present

## 2014-08-01 DIAGNOSIS — R079 Chest pain, unspecified: Secondary | ICD-10-CM | POA: Diagnosis not present

## 2014-08-01 DIAGNOSIS — I252 Old myocardial infarction: Secondary | ICD-10-CM

## 2014-08-01 DIAGNOSIS — R072 Precordial pain: Secondary | ICD-10-CM | POA: Diagnosis not present

## 2014-08-01 DIAGNOSIS — E785 Hyperlipidemia, unspecified: Secondary | ICD-10-CM | POA: Diagnosis present

## 2014-08-01 DIAGNOSIS — Z79899 Other long term (current) drug therapy: Secondary | ICD-10-CM | POA: Diagnosis not present

## 2014-08-01 DIAGNOSIS — J849 Interstitial pulmonary disease, unspecified: Secondary | ICD-10-CM | POA: Diagnosis present

## 2014-08-01 DIAGNOSIS — I25119 Atherosclerotic heart disease of native coronary artery with unspecified angina pectoris: Secondary | ICD-10-CM

## 2014-08-01 DIAGNOSIS — R4182 Altered mental status, unspecified: Secondary | ICD-10-CM | POA: Diagnosis not present

## 2014-08-01 DIAGNOSIS — R0789 Other chest pain: Secondary | ICD-10-CM | POA: Diagnosis not present

## 2014-08-01 DIAGNOSIS — J984 Other disorders of lung: Secondary | ICD-10-CM | POA: Diagnosis not present

## 2014-08-01 DIAGNOSIS — J209 Acute bronchitis, unspecified: Secondary | ICD-10-CM | POA: Diagnosis present

## 2014-08-01 LAB — COMPREHENSIVE METABOLIC PANEL
ALK PHOS: 81 U/L (ref 39–117)
ALT: 21 U/L (ref 0–35)
ALT: 19 U/L (ref 0–35)
ANION GAP: 11 (ref 5–15)
AST: 25 U/L (ref 0–37)
AST: 23 U/L (ref 0–37)
Albumin: 3.8 g/dL (ref 3.5–5.2)
Albumin: 3.6 g/dL (ref 3.5–5.2)
Alkaline Phosphatase: 77 U/L (ref 39–117)
Anion gap: 9 (ref 5–15)
BILIRUBIN TOTAL: 0.8 mg/dL (ref 0.3–1.2)
BUN: 15 mg/dL (ref 6–23)
BUN: 13 mg/dL (ref 6–23)
CO2: 25 mmol/L (ref 19–32)
CO2: 27 mmol/L (ref 19–32)
Calcium: 9.6 mg/dL (ref 8.4–10.5)
Calcium: 9.4 mg/dL (ref 8.4–10.5)
Chloride: 104 mmol/L (ref 96–112)
Chloride: 102 mmol/L (ref 96–112)
Creatinine, Ser: 0.67 mg/dL (ref 0.50–1.10)
Creatinine, Ser: 0.62 mg/dL (ref 0.50–1.10)
GFR calc Af Amer: >90 mL/min (ref >90)
GFR calc Af Amer: >90 mL/min (ref >90)
GFR calc non Af Amer: 86 mL/min — ABNORMAL LOW (ref >90)
GFR calc non Af Amer: 89 mL/min — ABNORMAL LOW (ref >90)
GLUCOSE: 104 mg/dL — AB (ref 70–99)
Glucose, Bld: 129 mg/dL — ABNORMAL HIGH (ref 70–99)
POTASSIUM: 3.7 mmol/L (ref 3.5–5.1)
Potassium: 3.8 mmol/L (ref 3.5–5.1)
SODIUM: 140 mmol/L (ref 135–145)
Sodium: 138 mmol/L (ref 135–145)
Total Bilirubin: 0.8 mg/dL (ref 0.3–1.2)
Total Protein: 7.8 g/dL (ref 6.0–8.3)
Total Protein: 7.6 g/dL (ref 6.0–8.3)

## 2014-08-01 LAB — CBC WITH DIFFERENTIAL/PLATELET
BASOS PCT: 0 % (ref 0–1)
Basophils Absolute: 0 10*3/uL (ref 0.0–0.1)
Basophils Absolute: 0 10*3/uL (ref 0.0–0.1)
Basophils Relative: 0 % (ref 0–1)
Eosinophils Absolute: 0.1 10*3/uL (ref 0.0–0.7)
Eosinophils Absolute: 0.1 10*3/uL (ref 0.0–0.7)
Eosinophils Relative: 2 % (ref 0–5)
Eosinophils Relative: 2 % (ref 0–5)
HCT: 37 % (ref 36.0–46.0)
HEMATOCRIT: 38.2 % (ref 36.0–46.0)
Hemoglobin: 12.7 g/dL (ref 12.0–15.0)
Hemoglobin: 12.5 g/dL (ref 12.0–15.0)
LYMPHS ABS: 2.4 10*3/uL (ref 0.7–4.0)
LYMPHS PCT: 27 % (ref 12–46)
Lymphocytes Relative: 36 % (ref 12–46)
Lymphs Abs: 2.8 10*3/uL (ref 0.7–4.0)
MCH: 30.6 pg (ref 26.0–34.0)
MCH: 31.1 pg (ref 26.0–34.0)
MCHC: 33.2 g/dL (ref 30.0–36.0)
MCHC: 33.8 g/dL (ref 30.0–36.0)
MCV: 92 fL (ref 78.0–100.0)
MCV: 92 fL (ref 78.0–100.0)
Monocytes Absolute: 0.6 10*3/uL (ref 0.1–1.0)
Monocytes Absolute: 0.5 10*3/uL (ref 0.1–1.0)
Monocytes Relative: 7 % (ref 3–12)
Monocytes Relative: 7 % (ref 3–12)
Neutro Abs: 5.6 10*3/uL (ref 1.7–7.7)
Neutro Abs: 4.3 10*3/uL (ref 1.7–7.7)
Neutrophils Relative %: 64 % (ref 43–77)
Neutrophils Relative %: 55 % (ref 43–77)
PLATELETS: 421 10*3/uL — AB (ref 150–400)
Platelets: 419 10*3/uL — ABNORMAL HIGH (ref 150–400)
RBC: 4.15 MIL/uL (ref 3.87–5.11)
RBC: 4.02 MIL/uL (ref 3.87–5.11)
RDW: 13.3 % (ref 11.5–15.5)
RDW: 13.2 % (ref 11.5–15.5)
WBC: 8.8 10*3/uL (ref 4.0–10.5)
WBC: 7.7 10*3/uL (ref 4.0–10.5)

## 2014-08-01 LAB — MRSA PCR SCREENING: MRSA by PCR: NEGATIVE

## 2014-08-01 LAB — TROPONIN I
Troponin I: <0.03 ng/mL (ref <0.031)
Troponin I: <0.03 ng/mL (ref <0.031)

## 2014-08-01 LAB — BRAIN NATRIURETIC PEPTIDE
B NATRIURETIC PEPTIDE 5: 31 pg/mL (ref 0.0–100.0)
B Natriuretic Peptide: 26.1 pg/mL (ref 0.0–100.0)

## 2014-08-01 LAB — MAGNESIUM: Magnesium: 2 mg/dL (ref 1.5–2.5)

## 2014-08-01 LAB — APTT: aPTT: 122 seconds — ABNORMAL HIGH (ref 24–37)

## 2014-08-01 LAB — TSH: TSH: 0.131 u[IU]/mL — ABNORMAL LOW (ref 0.350–4.500)

## 2014-08-01 LAB — I-STAT TROPONIN, ED: Troponin i, poc: 0 ng/mL (ref 0.00–0.08)

## 2014-08-01 LAB — PROTIME-INR
INR: 1.2 (ref 0.00–1.49)
Prothrombin Time: 15.3 seconds — ABNORMAL HIGH (ref 11.6–15.2)

## 2014-08-01 MED ORDER — SODIUM CHLORIDE 0.9 % IV SOLN
INTRAVENOUS | Status: DC
  Administered 2014-08-01: 10 mL/h via INTRAVENOUS

## 2014-08-01 MED ORDER — HEPARIN BOLUS VIA INFUSION
4000.0000 [IU] | Freq: Once | INTRAVENOUS | Status: AC
  Administered 2014-08-01: 4000 [IU] via INTRAVENOUS
  Filled 2014-08-01: qty 4000

## 2014-08-01 MED ORDER — HEPARIN (PORCINE) IN NACL 100-0.45 UNIT/ML-% IJ SOLN
1100.0000 [IU]/h | INTRAMUSCULAR | Status: DC
  Administered 2014-08-01: 1100 [IU]/h via INTRAVENOUS
  Filled 2014-08-01 (×3): qty 250

## 2014-08-01 MED ORDER — AMLODIPINE BESYLATE 5 MG PO TABS
5.0000 mg | ORAL_TABLET | Freq: Every day | ORAL | Status: DC
  Administered 2014-08-02 – 2014-08-03 (×2): 5 mg via ORAL
  Filled 2014-08-01 (×2): qty 1

## 2014-08-01 MED ORDER — ASPIRIN EC 81 MG PO TBEC
81.0000 mg | DELAYED_RELEASE_TABLET | Freq: Every day | ORAL | Status: DC
  Administered 2014-08-02 – 2014-08-03 (×2): 81 mg via ORAL
  Filled 2014-08-01 (×2): qty 1

## 2014-08-01 MED ORDER — NITROGLYCERIN 0.4 MG SL SUBL: 0.4000 mg | SUBLINGUAL_TABLET | SUBLINGUAL | Status: DC | PRN

## 2014-08-01 MED ORDER — ASPIRIN 81 MG PO CHEW: 324.0000 mg | CHEWABLE_TABLET | ORAL | Status: AC

## 2014-08-01 MED ORDER — NITROGLYCERIN 0.4 MG SL SUBL
0.4000 mg | SUBLINGUAL_TABLET | SUBLINGUAL | Status: DC | PRN
  Administered 2014-08-01: 0.4 mg via SUBLINGUAL
  Filled 2014-08-01: qty 1

## 2014-08-01 MED ORDER — ACETAMINOPHEN 325 MG PO TABS
650.0000 mg | ORAL_TABLET | ORAL | Status: DC | PRN
  Administered 2014-08-01: 650 mg via ORAL
  Filled 2014-08-01: qty 2

## 2014-08-01 MED ORDER — ONDANSETRON HCL 4 MG/2ML IJ SOLN: 4.0000 mg | Freq: Four times a day (QID) | INTRAMUSCULAR | Status: DC | PRN

## 2014-08-01 MED ORDER — NITROGLYCERIN IN D5W 200-5 MCG/ML-% IV SOLN
3.0000 ug/min | INTRAVENOUS | Status: DC
  Administered 2014-08-01: 10 ug/min via INTRAVENOUS
  Filled 2014-08-01: qty 250

## 2014-08-01 MED ORDER — ASPIRIN 300 MG RE SUPP
300.0000 mg | RECTAL | Status: AC
  Filled 2014-08-01: qty 1

## 2014-08-01 MED ORDER — METOPROLOL SUCCINATE ER 50 MG PO TB24
50.0000 mg | ORAL_TABLET | Freq: Every day | ORAL | Status: DC
  Administered 2014-08-02 – 2014-08-03 (×2): 50 mg via ORAL
  Filled 2014-08-01 (×2): qty 1

## 2014-08-01 MED ORDER — ASPIRIN 81 MG PO CHEW
324.0000 mg | CHEWABLE_TABLET | Freq: Once | ORAL | Status: AC
  Administered 2014-08-01: 324 mg via ORAL
  Filled 2014-08-01: qty 4

## 2014-08-01 NOTE — Progress Notes (Signed)
CARDIOLOGY OFFICE NOTE  Date:  08/01/2014    Jody Ruiz Date of Birth: 19-Aug-1940 Medical Record #[id number]  PCP:  Geoffery Lyons, MD  Cardiologist:  Martinique  Chief Complaint  Patient presents with  . Chest Pain    Work in visit - seen for Dr. Martinique.   . Shortness of Breath     History of Present Illness: Jody Ruiz is a 74 y.o. female who presents today for a work in visit. Seen for Dr. Martinique. She is s/p non-ST elevation myocardial infarction in October 2013. She had stenting of tandem lesions in the proximal and mid LAD with a 3.0 x 16 mm and 2.5 x 28 mm Promus stents respectively. In January 2014 she presented with some recurrent symptoms then repeat cardiac catheterization showed continued patency. In September 2014 she had a Myoview study was suggested some apical ischemia. Repeat cardiac catheterization continued to show stent patency at that time. Other issues are as noted below.  Last saw Dr. Martinique in October - felt to be doing ok.   Called yesterday -   Returned call to patient she stated she has been having chest pain,sob for the last 3 days.No chest pain at present.Stated she has been feeling bad no energy for the past 2 to 3 weeks.Appointment scheduled with Truitt Merle NP 08/01/14 at 11:00 am at [facility].Advised to go to ER if needed.    Thus added to my schedule for today.  Comes in here. Here with her husband. She is not doing well. She has basically been in bed for 3 weeks. She is "so fatigued" - can't do anything. Has been more short of breath. Cough. Fever, sweats as well. Treated with antibiotics. Looks like she was tested for autoimmune disorder- +lupus, +ANA and elevated sed rate as well. Referred to rheumatology and saw them on 07/10/2014 - has not heard back from them about the extensive labs that were drawn. Has had chest pressure - worse with exertion - responsive to NTG and aspirin. Now constant over the past day. Continues to  use NTG. Very frustrated about her overall condition. She tells me that her primary complaint is that of the chest pressure and severe fatigue. She has active chest pain at rest now.   Past Medical History  Diagnosis Date  . Anemia   . Hx of adenomatous colonic polyps   . CAD (coronary artery disease)     a. 04/2012 NSTEMI/Cath:  pLAD 90%, mLAD 70-80% (long) (3x16 & 2.5x28 Promus DES to p/m LAD), pD1 70-80%, pRI 30% (small), CFX 20%, OM1 40%, mOM2 30%, pRCA 30%, PDA 30-40%, pPLB 70%, then mid 90%, EF 65%;  b. 03/2013 Abnl CL w/apical isch;  c. 04/2013 Cath: LM nl, LAD patent stents, LCX <20, RCA 30p PDA 40-50 EF 55-60% ->Cont Med Rx.  Marland Kitchen Hypertension   . Microscopic colitis   . Arthritis   . Obesity   . HLD (hyperlipidemia)     intolerant to statins  . Dyspnea     a. CP and SOB 06/2012 => Ticagrelor changed to Plavix  . Hx of echocardiogram     a. Echo 2/14:  mild LVH, EF 55-65%, Gr 1 diast dysfn, mild LAE  . OSA (obstructive sleep apnea)     a. on CPAP  . Internal hemorrhoids   . Idiopathic pulmonary fibrosis   . Lung nodules     Past Surgical History  Procedure Laterality Date  . Appendectomy    .  Cholecystectomy    . Ventral hernia repair    . Breast biopsy      bilateral  . Knee arthroscopy      bilateral  . Vaginal polypectomy    . Shoulder arthroscopy  2012    left  . Cardiac catheterization      x4  . Coronary stent placement  04/2012    x2  . Left heart catheterization with coronary angiogram N/A 04/29/2012    Procedure: LEFT HEART CATHETERIZATION WITH CORONARY ANGIOGRAM;  Surgeon: Peter M Martinique, MD;  Location: [facility] CATH LAB;  Service: Cardiovascular;  Laterality: N/A;  . Percutaneous coronary stent intervention (pci-s) N/A 04/29/2012    Procedure: PERCUTANEOUS CORONARY STENT INTERVENTION (PCI-S);  Surgeon: Peter M Martinique, MD;  Location: [facility] CATH LAB;  Service: Cardiovascular;  Laterality: N/A;  . Left heart catheterization with coronary angiogram N/A 08/03/2012     Procedure: LEFT HEART CATHETERIZATION WITH CORONARY ANGIOGRAM;  Surgeon: Burnell Blanks, MD;  Location: [facility] CATH LAB;  Service: Cardiovascular;  Laterality: N/A;  . Left heart catheterization with coronary angiogram N/A 04/12/2013    Procedure: LEFT HEART CATHETERIZATION WITH CORONARY ANGIOGRAM;  Surgeon: Peter M Martinique, MD;  Location: [facility] CATH LAB;  Service: Cardiovascular;  Laterality: N/A;     Medications: Current Outpatient Prescriptions  Medication Sig Dispense Refill  . amLODipine (NORVASC) 5 MG tablet TAKE 1 TABLET ONCE A DAY 30 tablet 10  . aspirin EC 81 MG tablet Take 81 mg by mouth daily.    . cefUROXime (CEFTIN) 250 MG tablet Take 250 mg by mouth 2 (two) times daily with a meal.    . isosorbide mononitrate (IMDUR) 60 MG 24 hr tablet TAKE ONE TABLET AT BEDTIME 30 tablet 6  . levofloxacin (LEVAQUIN) 500 MG tablet Take 1 tablet (500 mg total) by mouth daily. (Patient not taking: Reported on 08/01/2014) 7 tablet 0  . metaxalone (SKELAXIN) 800 MG tablet Take 1 tablet by mouth daily as needed.    . metoprolol succinate (TOPROL XL) 50 MG 24 hr tablet Take 1 tablet (50 mg total) by mouth daily. Take with or immediately following a meal.    . nitroGLYCERIN (NITROSTAT) 0.4 MG SL tablet Place 1 tablet (0.4 mg total) under the tongue every 5 (five) minutes as needed for chest pain. 25 tablet 3  . polyvinyl alcohol (LIQUIFILM TEARS) 1.4 % ophthalmic solution Place 1 drop into both eyes as needed. For dry eyes     No current facility-administered medications for this visit.    Allergies: Allergies  Allergen Reactions  . Brilinta [Ticagrelor] Shortness Of Breath and Cough    fatigue  . Lisinopril Shortness Of Breath and Cough  . Statins     Muscle Weakness  . Zetia [Ezetimibe]     Myalgias   . Macrodantin [Nitrofurantoin Macrocrystal] Rash  . Sulfa Antibiotics Rash    Social History: The patient  reports that she has never smoked. She has never used smokeless tobacco. She  reports that she does not drink alcohol or use illicit drugs.   Family History: The patient's family history includes Breast cancer in her sister; Colon cancer in her sister; Colon polyps in her father; Heart disease (age of onset: 93) in her brother; Heart disease (age of onset: 51) in her mother; Heart disease (age of onset: 11) in her father; Kidney disease in her father; Prostate cancer in her father.   Review of Systems: Please see the history of present illness.   Otherwise, the review of  systems is positive for chest pain and shortness of breath. She is not able to eat. She is coughing. She is dizzy and has had headaches.  All other systems are reviewed and negative.   Physical Exam: VS:  BP 124/76 mmHg  Pulse 70  Ht 5\' 7"  (1.702 m)  Wt 213 lb 6.4 oz (96.798 kg)  BMI 33.42 kg/m2 .  BMI Body mass index is 33.42 kg/(m^2). Weight is down 10 pounds over the last 6 weeks. General: Pleasant. Well developed, well nourished and in no acute distress but looks quite fatigued.  HEENT: Normal. Neck: Supple, no JVD, carotid bruits, or masses noted.  Cardiac: Regular rate and rhythm. No murmurs, rubs, or gallops. No edema.  Respiratory:  Lungs with rales 1/3 up posteriorly.  GI: Soft and nontender.  MS: No deformity or atrophy. Gait and ROM intact. Skin: Warm and dry. Color sallow.   Neuro:  Strength and sensation are intact and no gross focal deficits noted.  Psych: Alert, appropriate and with normal affect. Looks fatigued.    Wt Readings from Last 3 Encounters:  08/01/14 213 lb 6.4 oz (96.798 kg)  07/17/14 217 lb 12.8 oz (98.793 kg)  06/13/14 223 lb 3.2 oz (101.243 kg)    LABORATORY DATA:  EKG:  EKG is ordered today. The EKG ordered today demonstrates NSR with nonspecific ST & T wave changes.  Lab Results  Component Value Date   WBC 8.2 07/16/2013   HGB 13.3 07/16/2013   HCT 38.7 07/16/2013   PLT 319 07/16/2013   GLUCOSE 105* 07/17/2014   CHOL 165 04/29/2012   TRIG 132  04/29/2012   HDL 43 04/29/2012   LDLCALC 96 04/29/2012   ALT 16 07/17/2014   AST 16 07/17/2014   NA 139 07/17/2014   K 4.0 07/17/2014   CL 102 07/17/2014   CREATININE 0.63 07/17/2014   BUN 14 07/17/2014   CO2 29 07/17/2014   INR 1.1* 04/11/2013    BNP (last 3 results) No results for input(s): PROBNP in the last 8760 hours.  Other Studies Reviewed Today:   Cardiac Catheterization Procedure Note  Name: Jody Ruiz MRN: [id number] DOB: Jul 02, 1941  Procedure: Left Heart Cath, Selective Coronary Angiography, LV angiography  Indication: 74 yo WF with history of CAD s/p stenting of the proximal and mid LAD in October 2013 with DES presents with symptoms of atypical chest pain. Myoview study shows a small apical area of ischemia. She continues to have pain despite optimal medical therapy.  Procedural Details: The right wrist was prepped, draped, and anesthetized with 1% lidocaine. Using the modified Seldinger technique, a 5 French sheath was introduced into the right radial artery. 3 mg of verapamil was administered through the sheath, weight-based unfractionated heparin was administered intravenously. Standard Judkins catheters were used for selective coronary angiography and left ventriculography. Catheter exchanges were performed over an exchange length guidewire. There were no immediate procedural complications. A TR band was used for radial hemostasis at the completion of the procedure. The patient was transferred to the post catheterization recovery area for further monitoring.  Procedural Findings: Hemodynamics: AO 171/74 mean 117 mmHg LV 176/21 mm Hg  Coronary angiography: Coronary dominance: right  Left mainstem: Normal  Left anterior descending (LAD): the stents in the proximal and mid LAD are widely patent. There is 30% disease distally.  Left circumflex (LCx): Diffuse irregularities less than 20%..  Right coronary  artery (RCA): 40-50% mid PDA. Otherwise no significant disease.  Left ventriculography: Left ventricular systolic function  is normal, LVEF is estimated at 55-65%, there is no significant mitral regurgitation   Final Conclusions:  1. Nonobstructive CAD, patent LAD stents. 2. Normal LV function.  Recommendations: Medical management. Consider GI source of chest pain.  Collier [facility] 04/12/2013, 10:52 AM          EXAM: CT CHEST WITHOUT CONTRAST  TECHNIQUE: Multidetector CT imaging of the chest was performed following the standard protocol without intravenous contrast. High resolution imaging of the lungs, as well as inspiratory and expiratory imaging, was performed.  COMPARISON: Chest CT 07/17/2013. High-resolution chest CT 03/01/2013.  FINDINGS: Mediastinum: Heart size is normal. There is no significant pericardial fluid, thickening or pericardial calcification. There is atherosclerosis of the thoracic aorta, the great vessels of the mediastinum and the coronary arteries, including calcified atherosclerotic plaque in the left anterior descending, left circumflex and right coronary arteries. Coronary artery stent in the proximal left anterior descending coronary artery. No pathologically enlarged mediastinal or hilar lymph nodes. Please note that accurate exclusion of hilar adenopathy is limited on noncontrast CT scans. Esophagus is unremarkable in appearance.  Lungs/Pleura: Small nodule measuring 7 mm in the right middle lobe intimately associated with the minor fissure (image 30 of series 3) is unchanged, presumably a subpleural lymph node. Likewise, a sessile appearing subpleural 7 mm nodule in the superior segment of the right lower lobe (image 24 of series 3), and a 4 mm subpleural nodule in the lateral segment of the right middle lobe (image 37 of series 3) are both also unchanged. No new suspicious appearing pulmonary nodules or masses are noted. No  acute consolidative airspace disease. No pleural effusions. High-resolution images demonstrate several areas of lower lung predominant subpleural reticulation and frank honeycombing, particularly in the posterior aspects of the lower lobes of the lungs bilaterally. The appearance is very similar to prior study 03/01/2013. No definite new areas of subpleural reticulation, parenchymal banding, traction bronchiectasis or areas of honeycombing are otherwise noted. Inspiratory and expiratory imaging demonstrates some mild air trapping, indicative of small airways disease.  Upper Abdomen: Status post cholecystectomy.  Musculoskeletal: There are no aggressive appearing lytic or blastic lesions noted in the visualized portions of the skeleton.  IMPRESSION: 1. Fibrotic changes are again noted in lung bases bilaterally. Given the extensive honeycombing, at first glance findings are suspicious for usual interstitial pneumonia (UIP), however, the isolation of findings in the extreme lung bases is slightly unusual for UIP (although not unheard of), and the complete lack of progression compared to the prior study is also unusual in the setting of UIP (but could be seen with highly effective medical therapy). This may alternatively simply reflect areas of post infectious fibrosis. 2. All previously noted small pulmonary nodules are unchanged dating back to 08/27/2012, these are presumably benign subpleural lymph nodes. 3. Atherosclerosis, including 3 vessel coronary artery disease. Assessment for potential risk factor modification, dietary therapy or pharmacologic therapy may be warranted, if clinically indicated.   Electronically Signed  By: Vinnie Langton M.D.  On: 06/23/2014 15:00        Assessment/Plan:  1. Coronary disease. Status post non-ST elevation myocardial infarction October 2013. Status post stenting of tandem lesions in the proximal and mid LAD. Repeat cardiac  catheterization in January and lastly in October of 2014 showed continued stent patency. Now with recurrent symptoms - with exertion and at rest - has chest pain at this time.   2. Dyslipidemia. She is intolerant to statins. On Zetia.  3. Idiopathic pulmonary fibrosis - followed by  pulmonary - sat 93% on RA  4. Obstructive sleep apnea.  5. Noted + lupus, ANA and elevated sed rate with no definitive plan in place.   I am going to admit for the chest pain. Will need primary care to see her in conjunction for these other issues that are most likely impacting her. May need to consider pulmonary referral as well. Need definitive plan from rheumatology as well.   Discussed with Dr. Angelena Form who is in agreement with this plan. Transferred by EMS.    Current medicines are reviewed with the patient today.  The patient does not have concerns regarding medicines.  The following changes have been made:  See above  Labs/ tests ordered today include:    Orders Placed This Encounter  Procedures  . EKG 12-Lead     Disposition:   Further disposition to follow. Will plan to admit. Check enzymes. Place on NTG/Heparin - may need to consider repeat cath versus medical management. Will defer to Dr. Martinique. I have called [facility] to see in consultation as well.   Card Master Notified. Cardiology to see again later today.   Patient is agreeable to this plan and will call if any problems develop in the interim.   Signed: Burtis Junes, RN, ANP-C 08/01/2014 12:09 PM  [facility] [address] [place],   [address] Phone: [phone] Fax: [phone]

## 2014-08-01 NOTE — ED Notes (Signed)
Per [facility]: pt. Is from home. Was seen by dr. Martinique today, he wanted to direct admit pt. But there were no beds available. Pt. Sent here to wait. Pt. Complaint of intermittent CP and pressure since last Thursday. Pt. Has been recently treated for URI. Has finished abx at this time.

## 2014-08-01 NOTE — Progress Notes (Signed)
I spoke with Jody Ruiz. Perplexing recent history. Clearly she has gone downhill significantly over the past 2 months. Symptoms of dyspnea, chest pain and dizziness. Much more fatigue. Cardiac evaluation negative so far with normal Ecg, troponin, and BNP. I suspect her symptoms are not cardiac related but she has failed to respond to antibiotics and CT of the chest in Dec was unchanged. Will check d-dimer. If etiology cannot be pinned down may need to consider cardiac cath to clear the air.   Peter Martinique MD, [facility]

## 2014-08-01 NOTE — ED Provider Notes (Signed)
CSN: [id number]     Arrival date & time 08/01/14  1305 History   First MD Initiated Contact with Patient 08/01/14 1306     Chief Complaint  Patient presents with  . Chest Pain     (Consider location/radiation/quality/duration/timing/severity/associated sxs/prior Treatment) HPI Comments: Over the last 1 month patient has had fever, night sweats, productive cough and completed a course of antibiotics. She states the productive cough is finally resolving however she also saw a rheumatologist who did a lot of blood work that she's not have the results back from you yet. Because of the chest pain she was seen at her cardiologist's office today and they sent her here for admission and further evaluation of her chest pain given her prior history of MI status post stent placement  Patient is a 74 y.o. female presenting with chest pain. The history is provided by the patient.  Chest Pain Pain location:  L chest Pain quality: aching and tightness   Pain radiates to:  L shoulder Pain radiates to the back: no   Pain severity:  Moderate Onset quality:  Gradual Duration:  3 hours Timing:  Intermittent Progression:  Waxing and waning Chronicity:  Recurrent Context comment:  Start at rest or activity. Not associated eating Relieved by:  Nitroglycerin and aspirin Worsened by:  Nothing tried Ineffective treatments:  None tried Associated symptoms: cough, fatigue, fever and shortness of breath   Associated symptoms: no abdominal pain, no altered mental status and no anorexia   Risk factors: coronary artery disease and hypertension   Risk factors: no immobilization, no smoking and no surgery     Past Medical History  Diagnosis Date  . Anemia   . Hx of adenomatous colonic polyps   . CAD (coronary artery disease)     a. 04/2012 NSTEMI/Cath:  pLAD 90%, mLAD 70-80% (long) (3x16 & 2.5x28 Promus DES to p/m LAD), pD1 70-80%, pRI 30% (small), CFX 20%, OM1 40%, mOM2 30%, pRCA 30%, PDA 30-40%, pPLB 70%,  then mid 90%, EF 65%;  b. 03/2013 Abnl CL w/apical isch;  c. 04/2013 Cath: LM nl, LAD patent stents, LCX <20, RCA 30p PDA 40-50 EF 55-60% ->Cont Med Rx.  Marland Kitchen Hypertension   . Microscopic colitis   . Arthritis   . Obesity   . HLD (hyperlipidemia)     intolerant to statins  . Dyspnea     a. CP and SOB 06/2012 => Ticagrelor changed to Plavix  . Hx of echocardiogram     a. Echo 2/14:  mild LVH, EF 55-65%, Gr 1 diast dysfn, mild LAE  . OSA (obstructive sleep apnea)     a. on CPAP  . Internal hemorrhoids   . Idiopathic pulmonary fibrosis   . Lung nodules    Past Surgical History  Procedure Laterality Date  . Appendectomy    . Cholecystectomy    . Ventral hernia repair    . Breast biopsy      bilateral  . Knee arthroscopy      bilateral  . Vaginal polypectomy    . Shoulder arthroscopy  2012    left  . Cardiac catheterization      x4  . Coronary stent placement  04/2012    x2  . Left heart catheterization with coronary angiogram N/A 04/29/2012    Procedure: LEFT HEART CATHETERIZATION WITH CORONARY ANGIOGRAM;  Surgeon: Peter M Martinique, MD;  Location: [facility] CATH LAB;  Service: Cardiovascular;  Laterality: N/A;  . Percutaneous coronary stent intervention (pci-s) N/A 04/29/2012  Procedure: PERCUTANEOUS CORONARY STENT INTERVENTION (PCI-S);  Surgeon: Peter M Martinique, MD;  Location: [facility] CATH LAB;  Service: Cardiovascular;  Laterality: N/A;  . Left heart catheterization with coronary angiogram N/A 08/03/2012    Procedure: LEFT HEART CATHETERIZATION WITH CORONARY ANGIOGRAM;  Surgeon: Burnell Blanks, MD;  Location: [facility] CATH LAB;  Service: Cardiovascular;  Laterality: N/A;  . Left heart catheterization with coronary angiogram N/A 04/12/2013    Procedure: LEFT HEART CATHETERIZATION WITH CORONARY ANGIOGRAM;  Surgeon: Peter M Martinique, MD;  Location: [facility] CATH LAB;  Service: Cardiovascular;  Laterality: N/A;   Family History  Problem Relation Age of Onset  . Breast cancer Sister   . Prostate cancer  Father   . Colon cancer Sister   . Colon polyps Father   . Heart disease Mother 72    Died acute MI  . Heart disease Father 65    CAD, died of ESRD  . Heart disease Brother 56    CABG  . Kidney disease Father    History  Substance Use Topics  . Smoking status: Never Smoker   . Smokeless tobacco: Never Used  . Alcohol Use: No   OB History    No data available     Review of Systems  Constitutional: Positive for fever and fatigue.  Respiratory: Positive for cough and shortness of breath.   Cardiovascular: Positive for chest pain.  Gastrointestinal: Negative for abdominal pain and anorexia.  All other systems reviewed and are negative.     Allergies  Brilinta; Lisinopril; Statins; Zetia; Macrodantin; and Sulfa antibiotics  Home Medications   Prior to Admission medications   Medication Sig Start Date End Date Taking? Authorizing Provider  amLODipine (NORVASC) 5 MG tablet TAKE 1 TABLET ONCE A DAY 05/12/14   Peter M Martinique, MD  aspirin EC 81 MG tablet Take 81 mg by mouth daily.    Historical Provider, MD  cefUROXime (CEFTIN) 250 MG tablet Take 250 mg by mouth 2 (two) times daily with a meal.    Historical Provider, MD  isosorbide mononitrate (IMDUR) 60 MG 24 hr tablet TAKE ONE TABLET AT BEDTIME 04/21/14   Peter M Martinique, MD  metaxalone (SKELAXIN) 800 MG tablet Take 1 tablet by mouth daily as needed. 10/21/13   Historical Provider, MD  metoprolol succinate (TOPROL XL) 50 MG 24 hr tablet Take 1 tablet (50 mg total) by mouth daily. Take with or immediately following a meal. 06/18/12   Hillary Bow, MD  nitroGLYCERIN (NITROSTAT) 0.4 MG SL tablet Place 1 tablet (0.4 mg total) under the tongue every 5 (five) minutes as needed for chest pain. 07/04/14   Peter M Martinique, MD  polyvinyl alcohol (LIQUIFILM TEARS) 1.4 % ophthalmic solution Place 1 drop into both eyes as needed. For dry eyes    Historical Provider, MD   BP 118/67 mmHg  Pulse 91  Temp(Src) 98.4 F (36.9 C) (Oral)  Resp  17  Ht 5\' 7"  (1.702 m)  Wt 213 lb (96.616 kg)  BMI 33.35 kg/m2  SpO2 97% Physical Exam  Constitutional: She is oriented to person, place, and time. She appears well-developed and well-nourished. No distress.  HENT:  Head: Normocephalic and atraumatic.  Eyes: EOM are normal. Pupils are equal, round, and reactive to light.  Cardiovascular: Normal rate, regular rhythm, normal heart sounds and intact distal pulses.  Exam reveals no friction rub.   No murmur heard. Pulmonary/Chest: Effort normal and breath sounds normal. She has no wheezes. She has no rales.  Abdominal: Soft.  Bowel sounds are normal. She exhibits no distension. There is no tenderness. There is no rebound and no guarding.  Musculoskeletal: Normal range of motion. She exhibits no tenderness.  No edema  Neurological: She is alert and oriented to person, place, and time. No cranial nerve deficit.  Skin: Skin is warm and dry. No rash noted.  Psychiatric: She has a normal mood and affect. Her behavior is normal.  Nursing note and vitals reviewed.   ED Course  Procedures (including critical care time) Labs Review Labs Reviewed  CBC WITH DIFFERENTIAL/PLATELET - Abnormal; Notable for the following:    Platelets 421 (*)    All other components within normal limits  COMPREHENSIVE METABOLIC PANEL  BRAIN NATRIURETIC PEPTIDE  I-STAT TROPOININ, ED    Imaging Review Dg Chest Port 1 View  08/01/2014   CLINICAL DATA:  Chest pain, left upper chest pain for 6 days, dizziness, coronary artery disease  EXAM: PORTABLE CHEST - 1 VIEW  COMPARISON:  07/17/2014  FINDINGS: There is elevation of the right diaphragm. The heart size and mediastinal contours are within normal limits. There is no focal consolidation, pleural effusion or pneumothorax. Is bibasilar fibrosis. The visualized skeletal structures are unremarkable.  IMPRESSION: No active disease.   Electronically Signed   By: Kathreen Devoid   On: 08/01/2014 14:11     EKG  Interpretation   Date/Time:  Tuesday August 01 2014 13:33:58 EST Ventricular Rate:  73 PR Interval:  168 QRS Duration: 89 QT Interval:  385 QTC Calculation: 424 R Axis:   12 Text Interpretation:  Sinus rhythm No significant change since last  tracing Confirmed by Maryan Rued  MD, Loree Fee ([id number]) on 08/01/2014 2:09:26 PM      MDM   Final diagnoses:  Chest pain    Patient with a prior cardiac history of stent placement and multiple catheterizations who presents today with 3 days of intermittent chest tightness on the left side causing shortness of breath. She was seen by her cardiologist today and sent her here for admission. Patient is still having left-sided chest pain and she was given aspirin and nitroglycerin. The pain is been intermittent over the last 3 days and is improved with nitroglycerin at home. Initial x-rays, labs and EKG are unchanged.  Of note also over the last 1 month patient has had fever, night sweats, cough. Productive cough is improving however she still has generalized fatigue. She completed a course of antibiotics without improvement. She's also seemed a rheumatologist with multiple labs that she still awaiting results for    Blanchie Dessert, MD 08/01/14 1446

## 2014-08-01 NOTE — Progress Notes (Signed)
ANTICOAGULATION CONSULT NOTE - Initial Consult  Pharmacy Consult for Heparin Indication: chest pain/ACS  Allergies  Allergen Reactions  . Brilinta [Ticagrelor] Shortness Of Breath and Cough    fatigue  . Lisinopril Shortness Of Breath and Cough  . Statins     Muscle Weakness  . Zetia [Ezetimibe]     Myalgias   . Macrodantin [Nitrofurantoin Macrocrystal] Rash  . Sulfa Antibiotics Rash    Patient Measurements: Height: 5\' 7"  (170.2 cm) Weight: 213 lb (96.616 kg) IBW/kg (Calculated) : 61.6 Heparin Dosing Weight: 83 kg  Vital Signs: Temp: 98.4 F (36.9 C) (01/26 1308) Temp Source: Oral (01/26 1308) BP: 135/60 mmHg (01/26 1645) Pulse Rate: 67 (01/26 1645)  Labs:  Recent Labs  08/01/14 1339  HGB 12.7  HCT 38.2  PLT 421*  CREATININE 0.67    Estimated Creatinine Clearance: 77 mL/min (by C-G formula based on Cr of 0.67).   Medical History: Past Medical History  Diagnosis Date  . Anemia   . Hx of adenomatous colonic polyps   . CAD (coronary artery disease)     a. 04/2012 NSTEMI/Cath:  pLAD 90%, mLAD 70-80% (long) (3x16 & 2.5x28 Promus DES to p/m LAD), pD1 70-80%, pRI 30% (small), CFX 20%, OM1 40%, mOM2 30%, pRCA 30%, PDA 30-40%, pPLB 70%, then mid 90%, EF 65%;  b. 03/2013 Abnl CL w/apical isch;  c. 04/2013 Cath: LM nl, LAD patent stents, LCX <20, RCA 30p PDA 40-50 EF 55-60% ->Cont Med Rx.  Marland Kitchen Hypertension   . Microscopic colitis   . Arthritis   . Obesity   . HLD (hyperlipidemia)     intolerant to statins  . Dyspnea     a. CP and SOB 06/2012 => Ticagrelor changed to Plavix  . Hx of echocardiogram     a. Echo 2/14:  mild LVH, EF 55-65%, Gr 1 diast dysfn, mild LAE  . OSA (obstructive sleep apnea)     a. on CPAP  . Internal hemorrhoids   . Idiopathic pulmonary fibrosis   . Lung nodules     Medications:  Prescriptions prior to admission  Medication Sig Dispense Refill Last Dose  . acetaminophen (TYLENOL) 500 MG tablet Take 1,000 mg by mouth daily as needed for  fever or headache.   Past Month at Unknown time  . amLODipine (NORVASC) 5 MG tablet TAKE 1 TABLET ONCE A DAY 30 tablet 10 08/01/2014 at Unknown time  . aspirin EC 81 MG tablet Take 81 mg by mouth daily.   08/01/2014 at Unknown time  . isosorbide mononitrate (IMDUR) 60 MG 24 hr tablet TAKE ONE TABLET AT BEDTIME 30 tablet 6 07/31/2014 at Unknown time  . metaxalone (SKELAXIN) 800 MG tablet Take 1 tablet by mouth daily as needed for muscle spasms.    over 30 days  . metoprolol succinate (TOPROL XL) 50 MG 24 hr tablet Take 1 tablet (50 mg total) by mouth daily. Take with or immediately following a meal.   08/01/2014 at 0830  . nitroGLYCERIN (NITROSTAT) 0.4 MG SL tablet Place 1 tablet (0.4 mg total) under the tongue every 5 (five) minutes as needed for chest pain. 25 tablet 3 Past Week at Unknown time  . polyvinyl alcohol (LIQUIFILM TEARS) 1.4 % ophthalmic solution Place 1 drop into both eyes as needed. For dry eyes   over 30 days   Scheduled:  . amLODipine  5 mg Oral Daily  . aspirin  324 mg Oral NOW   Or  . aspirin  300 mg Rectal NOW  . [  START ON 08/02/2014] aspirin EC  81 mg Oral Daily  . metoprolol succinate  50 mg Oral Daily   Infusions:  . sodium chloride    . nitroGLYCERIN      Assessment: 74yo female with history of NSTEMI presents from clinic having chest pain and SOB. Pharmacy is consulted to dose heparin for ACS/chest pain. Hgb 12.7, Plt 421, sCr 0.67, trop neg x1, BNP 31.  Goal of Therapy:  Heparin level 0.3-0.7 units/ml Monitor platelets by anticoagulation protocol: Yes   Plan:  Give 4000 units bolus x 1 Start heparin infusion at 1100 units/hr Check anti-Xa level in 8 hours and daily while on heparin Continue to monitor H&H and platelets  Monitor s/sx of bleeding  Andrey Cota. Diona Foley, PharmD Clinical Pharmacist Pager [phone] 08/01/2014,5:26 PM

## 2014-08-01 NOTE — ED Notes (Signed)
Pt's husband Lamari Youngers cell phone number [phone]

## 2014-08-01 NOTE — H&P (Signed)
Jody Ruiz  08/01/2014 11:00 AM  Office Visit  MRN:  [id number]   Description: Female DOB: 1941/04/19  Provider: Burtis Junes, NP  Department: [facility]       Vital Signs  Most recent update: 08/01/2014 12:20 PM by Newt Minion, RN    BP Pulse Temp(Src) Ht Wt BMI    124/76 mmHg 70 98.7 F (37.1 C) 5\' 7"  (1.702 m) 213 lb 6.4 oz (96.798 kg) 33.42 kg/m2     SpO2              93%         Vitals History     Progress Notes      Burtis Junes, NP at 08/01/2014 11:33 AM     Status: Signed       Expand All Collapse All       CARDIOLOGY OFFICE NOTE  Date: 08/01/2014    Jody Ruiz Date of Birth: 1940-09-12 Medical Record #[id number]  PCP: Geoffery Lyons, MD Cardiologist: Martinique  Chief Complaint  Patient presents with  . Chest Pain    Work in visit - seen for Dr. Martinique.   . Shortness of Breath     History of Present Illness: Jody Ruiz is a 74 y.o. female who presents today for a work in visit. Seen for Dr. Martinique. She is s/p non-ST elevation myocardial infarction in October 2013. She had stenting of tandem lesions in the proximal and mid LAD with a 3.0 x 16 mm and 2.5 x 28 mm Promus stents respectively. In January 2014 she presented with some recurrent symptoms then repeat cardiac catheterization showed continued patency. In September 2014 she had a Myoview study was suggested some apical ischemia. Repeat cardiac catheterization continued to show stent patency at that time. Other issues are as noted below.  Last saw Dr. Martinique in October - felt to be doing ok.   Called yesterday -   Returned call to patient she stated she has been having chest pain,sob for the last 3 days.No chest pain at present.Stated she has been feeling bad no energy for the past 2 to 3 weeks.Appointment scheduled with Truitt Merle NP 08/01/14 at 11:00 am at [facility].Advised to go to ER if needed.    Thus added  to my schedule for today.  Comes in here. Here with her husband. She is not doing well. She has basically been in bed for 3 weeks. She is "so fatigued" - can't do anything. Has been more short of breath. Cough. Fever, sweats as well. Treated with antibiotics. Looks like she was tested for autoimmune disorder- +lupus, +ANA and elevated sed rate as well. Referred to rheumatology and saw them on 07/10/2014 - has not heard back from them about the extensive labs that were drawn. Has had chest pressure - worse with exertion - responsive to NTG and aspirin. Now constant over the past day. Continues to use NTG. Very frustrated about her overall condition. She tells me that her primary complaint is that of the chest pressure and severe fatigue. She has active chest pain at rest now.   Past Medical History  Diagnosis Date  . Anemia   . Hx of adenomatous colonic polyps   . CAD (coronary artery disease)     a. 04/2012 NSTEMI/Cath: pLAD 90%, mLAD 70-80% (long) (3x16 & 2.5x28 Promus DES to p/m LAD), pD1 70-80%, pRI 30% (small), CFX 20%, OM1 40%, mOM2 30%, pRCA 30%, PDA 30-40%, pPLB 70%, then  mid 90%, EF 65%; b. 03/2013 Abnl CL w/apical isch; c. 04/2013 Cath: LM nl, LAD patent stents, LCX <20, RCA 30p PDA 40-50 EF 55-60% ->Cont Med Rx.  Marland Kitchen Hypertension   . Microscopic colitis   . Arthritis   . Obesity   . HLD (hyperlipidemia)     intolerant to statins  . Dyspnea     a. CP and SOB 06/2012 => Ticagrelor changed to Plavix  . Hx of echocardiogram     a. Echo 2/14: mild LVH, EF 55-65%, Gr 1 diast dysfn, mild LAE  . OSA (obstructive sleep apnea)     a. on CPAP  . Internal hemorrhoids   . Idiopathic pulmonary fibrosis   . Lung nodules     Past Surgical History  Procedure Laterality Date  . Appendectomy    . Cholecystectomy    . Ventral hernia repair    . Breast biopsy      bilateral  . Knee arthroscopy       bilateral  . Vaginal polypectomy    . Shoulder arthroscopy  2012    left  . Cardiac catheterization      x4  . Coronary stent placement  04/2012    x2  . Left heart catheterization with coronary angiogram N/A 04/29/2012    Procedure: LEFT HEART CATHETERIZATION WITH CORONARY ANGIOGRAM; Surgeon: Peter M Martinique, MD; Location: [facility] CATH LAB; Service: Cardiovascular; Laterality: N/A;  . Percutaneous coronary stent intervention (pci-s) N/A 04/29/2012    Procedure: PERCUTANEOUS CORONARY STENT INTERVENTION (PCI-S); Surgeon: Peter M Martinique, MD; Location: [facility] CATH LAB; Service: Cardiovascular; Laterality: N/A;  . Left heart catheterization with coronary angiogram N/A 08/03/2012    Procedure: LEFT HEART CATHETERIZATION WITH CORONARY ANGIOGRAM; Surgeon: Burnell Blanks, MD; Location: [facility] CATH LAB; Service: Cardiovascular; Laterality: N/A;  . Left heart catheterization with coronary angiogram N/A 04/12/2013    Procedure: LEFT HEART CATHETERIZATION WITH CORONARY ANGIOGRAM; Surgeon: Peter M Martinique, MD; Location: [facility] CATH LAB; Service: Cardiovascular; Laterality: N/A;     Medications: Current Outpatient Prescriptions  Medication Sig Dispense Refill  . amLODipine (NORVASC) 5 MG tablet TAKE 1 TABLET ONCE A DAY 30 tablet 10  . aspirin EC 81 MG tablet Take 81 mg by mouth daily.    . cefUROXime (CEFTIN) 250 MG tablet Take 250 mg by mouth 2 (two) times daily with a meal.    . isosorbide mononitrate (IMDUR) 60 MG 24 hr tablet TAKE ONE TABLET AT BEDTIME 30 tablet 6  . levofloxacin (LEVAQUIN) 500 MG tablet Take 1 tablet (500 mg total) by mouth daily. (Patient not taking: Reported on 08/01/2014) 7 tablet 0  . metaxalone (SKELAXIN) 800 MG tablet Take 1 tablet by mouth daily as needed.    . metoprolol succinate (TOPROL XL) 50 MG 24 hr tablet Take 1 tablet (50 mg total) by mouth daily. Take with or immediately  following a meal.    . nitroGLYCERIN (NITROSTAT) 0.4 MG SL tablet Place 1 tablet (0.4 mg total) under the tongue every 5 (five) minutes as needed for chest pain. 25 tablet 3  . polyvinyl alcohol (LIQUIFILM TEARS) 1.4 % ophthalmic solution Place 1 drop into both eyes as needed. For dry eyes     No current facility-administered medications for this visit.    Allergies: Allergies  Allergen Reactions  . Brilinta [Ticagrelor] Shortness Of Breath and Cough    fatigue  . Lisinopril Shortness Of Breath and Cough  . Statins     Muscle Weakness  . Zetia [Ezetimibe]  Myalgias   . Macrodantin [Nitrofurantoin Macrocrystal] Rash  . Sulfa Antibiotics Rash    Social History: The patient  reports that she has never smoked. She has never used smokeless tobacco. She reports that she does not drink alcohol or use illicit drugs.  Family History: The patient's family history includes Breast cancer in her sister; Colon cancer in her sister; Colon polyps in her father; Heart disease (age of onset: 29) in her brother; Heart disease (age of onset: 49) in her mother; Heart disease (age of onset: 84) in her father; Kidney disease in her father; Prostate cancer in her father.   Review of Systems: Please see the history of present illness. Otherwise, the review of systems is positive for chest pain and shortness of breath. She is not able to eat. She is coughing. She is dizzy and has had headaches. All other systems are reviewed and negative.   Physical Exam: VS: BP 124/76 mmHg  Pulse 70  Ht 5\' 7"  (1.702 m)  Wt 213 lb 6.4 oz (96.798 kg)  BMI 33.42 kg/m2 . BMI Body mass index is 33.42 kg/(m^2). Weight is down 10 pounds over the last 6 weeks. General: Pleasant. Well developed, well nourished and in no acute distress but looks quite fatigued.  HEENT: Normal.  Neck: Supple, no JVD, carotid bruits, or masses noted.  Cardiac: Regular rate and rhythm. No  murmurs, rubs, or gallops. No edema.  Respiratory: Lungs with rales 1/3 up posteriorly.  GI: Soft and nontender.  MS: No deformity or atrophy. Gait and ROM intact.  Skin: Warm and dry. Color sallow.  Neuro: Strength and sensation are intact and no gross focal deficits noted.  Psych: Alert, appropriate and with normal affect. Looks fatigued.    Wt Readings from Last 3 Encounters:  08/01/14 213 lb 6.4 oz (96.798 kg)  07/17/14 217 lb 12.8 oz (98.793 kg)  06/13/14 223 lb 3.2 oz (101.243 kg)    LABORATORY DATA:  EKG: EKG is ordered today. The EKG ordered today demonstrates NSR with nonspecific ST & T wave changes.   Recent Labs    Lab Results  Component Value Date   WBC 8.2 07/16/2013   HGB 13.3 07/16/2013   HCT 38.7 07/16/2013   PLT 319 07/16/2013   GLUCOSE 105* 07/17/2014   CHOL 165 04/29/2012   TRIG 132 04/29/2012   HDL 43 04/29/2012   LDLCALC 96 04/29/2012   ALT 16 07/17/2014   AST 16 07/17/2014   NA 139 07/17/2014   K 4.0 07/17/2014   CL 102 07/17/2014   CREATININE 0.63 07/17/2014   BUN 14 07/17/2014   CO2 29 07/17/2014   INR 1.1* 04/11/2013      BNP (last 3 results)  Recent Labs (within last 365 days)    No results for input(s): PROBNP in the last 8760 hours.    Other Studies Reviewed Today:   Cardiac Catheterization Procedure Note  Name: Jody Ruiz MRN: [id number] DOB: [birth year]  Procedure: Left Heart Cath, Selective Coronary Angiography, LV angiography  Indication: 74 yo WF with history of CAD s/p stenting of the proximal and mid LAD in October 2013 with DES presents with symptoms of atypical chest pain. Myoview study shows a small apical area of ischemia. She continues to have pain despite optimal medical therapy.  Procedural Details: The right wrist was prepped, draped, and anesthetized with 1% lidocaine. Using the  modified Seldinger technique, a 5 French sheath was introduced into the right radial artery. 3 mg of verapamil was  administered through the sheath, weight-based unfractionated heparin was administered intravenously. Standard Judkins catheters were used for selective coronary angiography and left ventriculography. Catheter exchanges were performed over an exchange length guidewire. There were no immediate procedural complications. A TR band was used for radial hemostasis at the completion of the procedure. The patient was transferred to the post catheterization recovery area for further monitoring.  Procedural Findings: Hemodynamics: AO 171/74 mean 117 mmHg LV 176/21 mm Hg  Coronary angiography: Coronary dominance: right  Left mainstem: Normal  Left anterior descending (LAD): the stents in the proximal and mid LAD are widely patent. There is 30% disease distally.  Left circumflex (LCx): Diffuse irregularities less than 20%..  Right coronary artery (RCA): 40-50% mid PDA. Otherwise no significant disease.  Left ventriculography: Left ventricular systolic function is normal, LVEF is estimated at 55-65%, there is no significant mitral regurgitation   Final Conclusions:  1. Nonobstructive CAD, patent LAD stents. 2. Normal LV function.  Recommendations: Medical management. Consider GI source of chest pain.  Collier [facility] 04/12/2013, 10:52 AM          EXAM: CT CHEST WITHOUT CONTRAST  TECHNIQUE: Multidetector CT imaging of the chest was performed following the standard protocol without intravenous contrast. High resolution imaging of the lungs, as well as inspiratory and expiratory imaging, was performed.  COMPARISON: Chest CT 07/17/2013. High-resolution chest CT 03/01/2013.  FINDINGS: Mediastinum: Heart size is normal. There is no significant pericardial fluid, thickening or pericardial calcification. There is atherosclerosis of the thoracic  aorta, the great vessels of the mediastinum and the coronary arteries, including calcified atherosclerotic plaque in the left anterior descending, left circumflex and right coronary arteries. Coronary artery stent in the proximal left anterior descending coronary artery. No pathologically enlarged mediastinal or hilar lymph nodes. Please note that accurate exclusion of hilar adenopathy is limited on noncontrast CT scans. Esophagus is unremarkable in appearance.  Lungs/Pleura: Small nodule measuring 7 mm in the right middle lobe intimately associated with the minor fissure (image 30 of series 3) is unchanged, presumably a subpleural lymph node. Likewise, a sessile appearing subpleural 7 mm nodule in the superior segment of the right lower lobe (image 24 of series 3), and a 4 mm subpleural nodule in the lateral segment of the right middle lobe (image 37 of series 3) are both also unchanged. No new suspicious appearing pulmonary nodules or masses are noted. No acute consolidative airspace disease. No pleural effusions. High-resolution images demonstrate several areas of lower lung predominant subpleural reticulation and frank honeycombing, particularly in the posterior aspects of the lower lobes of the lungs bilaterally. The appearance is very similar to prior study 03/01/2013. No definite new areas of subpleural reticulation, parenchymal banding, traction bronchiectasis or areas of honeycombing are otherwise noted. Inspiratory and expiratory imaging demonstrates some mild air trapping, indicative of small airways disease.  Upper Abdomen: Status post cholecystectomy.  Musculoskeletal: There are no aggressive appearing lytic or blastic lesions noted in the visualized portions of the skeleton.  IMPRESSION: 1. Fibrotic changes are again noted in lung bases bilaterally. Given the extensive honeycombing, at first glance findings are suspicious for usual interstitial pneumonia (UIP),  however, the isolation of findings in the extreme lung bases is slightly unusual for UIP (although not unheard of), and the complete lack of progression compared to the prior study is also unusual in the setting of UIP (but could be seen with highly effective medical therapy). This may alternatively simply reflect areas of post infectious fibrosis. 2. All previously noted small  pulmonary nodules are unchanged dating back to 08/27/2012, these are presumably benign subpleural lymph nodes. 3. Atherosclerosis, including 3 vessel coronary artery disease. Assessment for potential risk factor modification, dietary therapy or pharmacologic therapy may be warranted, if clinically indicated.   Electronically Signed  By: Vinnie Langton M.D.  On: 06/23/2014 15:00        Assessment/Plan:  1. Coronary disease. Status post non-ST elevation myocardial infarction October 2013. Status post stenting of tandem lesions in the proximal and mid LAD. Repeat cardiac catheterization in January and lastly in October of 2014 showed continued stent patency. Now with recurrent symptoms - with exertion and at rest - has chest pain at this time.   2. Dyslipidemia. She is intolerant to statins. On Zetia.  3. Idiopathic pulmonary fibrosis - followed by pulmonary - sat 93% on RA  4. Obstructive sleep apnea.  5. Noted + lupus, ANA and elevated sed rate with no definitive plan in place.   I am going to admit for the chest pain. Will need primary care to see her in conjunction for these other issues that are most likely impacting her. May need to consider pulmonary referral as well. Need definitive plan from rheumatology as well.   Discussed with Dr. Angelena Form who is in agreement with this plan. Transferred by EMS.   Current medicines are reviewed with the patient today. The patient does not have concerns regarding medicines.  The following changes have been made: See above  Labs/ tests ordered today  include:   Orders Placed This Encounter  Procedures  . EKG 12-Lead     Disposition: Further disposition to follow. Will plan to admit. Check enzymes. Place on NTG/Heparin - may need to consider repeat cath versus medical management. Will defer to Dr. Martinique. I have called [place] to see in consultation as well.   Card Master Notified. Cardiology to see again later today. I have personally spoken to Dr. Martinique as well.   Patient is agreeable to this plan and will call if any problems develop in the interim.   Signed: Burtis Junes, RN, ANP-C 08/01/2014 12:09 PM  [facility] [address] [place],  [address] Phone: [phone] Fax: [phone]                   Referring Provider     Geoffery Lyons, MD     Diagnoses     Ischemic chest pain - Primary    ICD-9-CM: 786.50 ICD-10-CM: I20.9    Essential hypertension     ICD-9-CM: 401.9 ICD-10-CM: I10    Coronary artery disease with unspecified angina pectoris     ICD-9-CM: 414.00, 413.9 ICD-10-CM: I25.119    Shortness of breath     ICD-9-CM: 786.05 ICD-10-CM: R06.02    Essential hypertension, benign     ICD-9-CM: 401.1 ICD-10-CM: I10       Reason for Visit     Chest Pain    Work in visit - seen for Dr. Martinique.     Shortness of Breath    Reason for Visit History        Level of Service     PR OFFICE OUTPATIENT VISIT 25 MINUTES [99214]      Follow-up and Disposition     Routing History       All Charges for This Encounter     Code Description Service Date Service Provider Modifiers Qty    Allen, COMPLETE 08/01/2014 Cecille Rubin  Marin Olp, NP  1    [phone] PR OFFICE OUTPATIENT VISIT 25 MINUTES 08/01/2014 Burtis Junes, NP  1    [phone] PR ORAL ANTIPLATELET T THERAPY PRESCRIBED 08/01/2014 Burtis Junes, NP  1    [phone] PR ASP THERP USED 08/01/2014 Burtis Junes, NP  1    [phone] PR CURRENT TOBACCO NON-USER 08/01/2014 Burtis Junes, NP  1      AVS Reports     No AVS Snapshots are available for this encounter.     Patient Instructions     We are going to admit you to the hospital       Routing History     There are no sent or routed communications associated with this encounter.     Previous Visit       Provider Department Encounter #    07/31/2014 2:39 PM Truitt Merle, NP [facility] [id number]

## 2014-08-01 NOTE — Patient Instructions (Signed)
We are going to admit you to the hospital.  

## 2014-08-02 ENCOUNTER — Encounter (HOSPITAL_COMMUNITY)
Admission: EM | Disposition: A | Discharge status: Home / Self Care | Payer: Self-pay | Source: Home / Self Care | Attending: Cardiology

## 2014-08-02 DIAGNOSIS — I2510 Atherosclerotic heart disease of native coronary artery without angina pectoris: Secondary | ICD-10-CM | POA: Diagnosis not present

## 2014-08-02 DIAGNOSIS — R0600 Dyspnea, unspecified: Secondary | ICD-10-CM | POA: Diagnosis not present

## 2014-08-02 DIAGNOSIS — R079 Chest pain, unspecified: Secondary | ICD-10-CM | POA: Diagnosis not present

## 2014-08-02 DIAGNOSIS — I25119 Atherosclerotic heart disease of native coronary artery with unspecified angina pectoris: Secondary | ICD-10-CM

## 2014-08-02 DIAGNOSIS — J208 Acute bronchitis due to other specified organisms: Secondary | ICD-10-CM | POA: Diagnosis present

## 2014-08-02 DIAGNOSIS — I10 Essential (primary) hypertension: Secondary | ICD-10-CM

## 2014-08-02 DIAGNOSIS — E669 Obesity, unspecified: Secondary | ICD-10-CM | POA: Diagnosis present

## 2014-08-02 DIAGNOSIS — R072 Precordial pain: Secondary | ICD-10-CM | POA: Diagnosis not present

## 2014-08-02 DIAGNOSIS — E785 Hyperlipidemia, unspecified: Secondary | ICD-10-CM | POA: Diagnosis present

## 2014-08-02 DIAGNOSIS — I200 Unstable angina: Secondary | ICD-10-CM

## 2014-08-02 DIAGNOSIS — J8410 Pulmonary fibrosis, unspecified: Secondary | ICD-10-CM

## 2014-08-02 DIAGNOSIS — J84112 Idiopathic pulmonary fibrosis: Secondary | ICD-10-CM | POA: Diagnosis not present

## 2014-08-02 DIAGNOSIS — Z955 Presence of coronary angioplasty implant and graft: Secondary | ICD-10-CM | POA: Diagnosis not present

## 2014-08-02 DIAGNOSIS — R4182 Altered mental status, unspecified: Secondary | ICD-10-CM | POA: Diagnosis not present

## 2014-08-02 DIAGNOSIS — R0789 Other chest pain: Secondary | ICD-10-CM | POA: Diagnosis not present

## 2014-08-02 DIAGNOSIS — G4733 Obstructive sleep apnea (adult) (pediatric): Secondary | ICD-10-CM | POA: Diagnosis not present

## 2014-08-02 DIAGNOSIS — Z7982 Long term (current) use of aspirin: Secondary | ICD-10-CM | POA: Diagnosis not present

## 2014-08-02 DIAGNOSIS — I252 Old myocardial infarction: Secondary | ICD-10-CM | POA: Diagnosis not present

## 2014-08-02 DIAGNOSIS — Z6833 Body mass index (BMI) 33.0-33.9, adult: Secondary | ICD-10-CM | POA: Diagnosis not present

## 2014-08-02 DIAGNOSIS — Z79899 Other long term (current) drug therapy: Secondary | ICD-10-CM | POA: Diagnosis not present

## 2014-08-02 HISTORY — PX: LEFT AND RIGHT HEART CATHETERIZATION WITH CORONARY ANGIOGRAM: SHX5449

## 2014-08-02 LAB — POCT I-STAT 3, VENOUS BLOOD GAS (G3P V)
Acid-base deficit: 8 mmol/L — ABNORMAL HIGH (ref 0.0–2.0)
Bicarbonate: 17.5 mEq/L — ABNORMAL LOW (ref 20.0–24.0)
O2 SAT: 72 %
PO2 VEN: 40 mmHg (ref 30.0–45.0)
TCO2: 18 mmol/L (ref 0–100)
pCO2, Ven: 33.4 mmHg — ABNORMAL LOW (ref 45.0–50.0)
pH, Ven: 7.326 — ABNORMAL HIGH (ref 7.250–7.300)

## 2014-08-02 LAB — POCT I-STAT 3, ART BLOOD GAS (G3+)
Acid-base deficit: 3 mmol/L — ABNORMAL HIGH (ref 0.0–2.0)
BICARBONATE: 22.1 meq/L (ref 20.0–24.0)
O2 Saturation: 92 %
TCO2: 23 mmol/L (ref 0–100)
pCO2 arterial: 36.6 mmHg (ref 35.0–45.0)
pH, Arterial: 7.389 (ref 7.350–7.450)
pO2, Arterial: 65 mmHg — ABNORMAL LOW (ref 80.0–100.0)

## 2014-08-02 LAB — CBC
HEMATOCRIT: 35.5 % — AB (ref 36.0–46.0)
HEMOGLOBIN: 11.7 g/dL — AB (ref 12.0–15.0)
MCH: 30.5 pg (ref 26.0–34.0)
MCHC: 33 g/dL (ref 30.0–36.0)
MCV: 92.7 fL (ref 78.0–100.0)
PLATELETS: 365 10*3/uL (ref 150–400)
RBC: 3.83 MIL/uL — AB (ref 3.87–5.11)
RDW: 13.2 % (ref 11.5–15.5)
WBC: 7.3 10*3/uL (ref 4.0–10.5)

## 2014-08-02 LAB — TROPONIN I: Troponin I: <0.03 ng/mL (ref <0.031)

## 2014-08-02 LAB — HEPARIN LEVEL (UNFRACTIONATED): Heparin Unfractionated: 0.37 IU/mL (ref 0.30–0.70)

## 2014-08-02 SURGERY — LEFT AND RIGHT HEART CATHETERIZATION WITH CORONARY ANGIOGRAM

## 2014-08-02 MED ORDER — FENTANYL CITRATE 0.05 MG/ML IJ SOLN
INTRAMUSCULAR | Status: AC
  Filled 2014-08-02: qty 2

## 2014-08-02 MED ORDER — NITROGLYCERIN 1 MG/10 ML FOR IR/CATH LAB
INTRA_ARTERIAL | Status: AC
  Filled 2014-08-02: qty 10

## 2014-08-02 MED ORDER — ASPIRIN 81 MG PO CHEW: 81.0000 mg | CHEWABLE_TABLET | ORAL | Status: AC

## 2014-08-02 MED ORDER — ISOSORBIDE MONONITRATE ER 60 MG PO TB24
60.0000 mg | ORAL_TABLET | Freq: Every day | ORAL | Status: DC
  Administered 2014-08-02: 60 mg via ORAL
  Filled 2014-08-02 (×2): qty 1

## 2014-08-02 MED ORDER — HEPARIN SODIUM (PORCINE) 1000 UNIT/ML IJ SOLN
INTRAMUSCULAR | Status: AC
  Filled 2014-08-02: qty 1

## 2014-08-02 MED ORDER — VERAPAMIL HCL 2.5 MG/ML IV SOLN
INTRAVENOUS | Status: AC
  Filled 2014-08-02: qty 2

## 2014-08-02 MED ORDER — LIDOCAINE HCL (PF) 1 % IJ SOLN
INTRAMUSCULAR | Status: AC
  Filled 2014-08-02: qty 30

## 2014-08-02 MED ORDER — SODIUM CHLORIDE 0.9 % IV SOLN: 250.0000 mL | INTRAVENOUS | Status: DC | PRN

## 2014-08-02 MED ORDER — SODIUM CHLORIDE 0.9 % IV SOLN
INTRAVENOUS | Status: AC
  Administered 2014-08-02: 75 mL/h via INTRAVENOUS

## 2014-08-02 MED ORDER — SODIUM CHLORIDE 0.9 % IJ SOLN: 3.0000 mL | Freq: Two times a day (BID) | INTRAMUSCULAR | Status: DC

## 2014-08-02 MED ORDER — MIDAZOLAM HCL 2 MG/2ML IJ SOLN
INTRAMUSCULAR | Status: AC
  Filled 2014-08-02: qty 2

## 2014-08-02 MED ORDER — SODIUM CHLORIDE 0.9 % IJ SOLN: 3.0000 mL | INTRAMUSCULAR | Status: DC | PRN

## 2014-08-02 MED ORDER — HEPARIN (PORCINE) IN NACL 2-0.9 UNIT/ML-% IJ SOLN
INTRAMUSCULAR | Status: AC
  Filled 2014-08-02: qty 1000

## 2014-08-02 MED ORDER — SODIUM CHLORIDE 0.9 % IV SOLN
INTRAVENOUS | Status: DC
  Administered 2014-08-02: 10:00:00 via INTRAVENOUS

## 2014-08-02 NOTE — Progress Notes (Signed)
[place] for Heparin Indication: chest pain/ACS  Allergies  Allergen Reactions  . Brilinta [Ticagrelor] Shortness Of Breath and Cough    fatigue  . Lisinopril Shortness Of Breath and Cough  . Statins     Muscle Weakness  . Zetia [Ezetimibe]     Myalgias   . Macrodantin [Nitrofurantoin Macrocrystal] Rash  . Sulfa Antibiotics Rash    Patient Measurements: Height: 5\' 7"  (170.2 cm) Weight: 216 lb 0.8 oz (98 kg) IBW/kg (Calculated) : 61.6 Heparin Dosing Weight: 83 kg  Vital Signs: Temp: 97.9 F (36.6 C) (01/27 0400) Temp Source: Oral (01/27 0400) BP: 126/58 mmHg (01/27 0400) Pulse Rate: 74 (01/27 0400)  Labs:  Recent Labs  08/01/14 1339 08/01/14 1816 08/01/14 2259 08/02/14 0220  HGB 12.7 12.5  --   --   HCT 38.2 37.0  --   --   PLT 421* 419*  --   --   APTT  --  122*  --   --   LABPROT  --  15.3*  --   --   INR  --  1.20  --   --   HEPARINUNFRC  --   --   --  0.37  CREATININE 0.67 0.62  --   --   TROPONINI  --  <0.03 <0.03  --     Estimated Creatinine Clearance: 77.6 mL/min (by C-G formula based on Cr of 0.62).  Assessment: 74 y.o. female with chest pain for heparin   Goal of Therapy:  Heparin level 0.3-0.7 units/ml Monitor platelets by anticoagulation protocol: Yes   Plan: Continue Heparin at current rate for now F/U plan   Phillis Knack, PharmD, BCPS   08/02/2014,4:53 AM

## 2014-08-02 NOTE — H&P (View-Only) (Signed)
PROGRESS NOTE Primary cardiologist : Martinique  Subjective:   74 yo with hx of CAD, hx of MI in [place] 2013, last cath in Sept. 2014 showed patent stents.  Has had CP for 3 days, low energy, dyspnea. , increasing fatigue. Has had + lupus and ANA markers.  Has idopathic pulmonary fibrosis . Has had constant pain.  Troponin levels are negative     Objective:    Vital Signs:   Temp:  [97.5 F (36.4 C)-99.2 F (37.3 C)] 97.5 F (36.4 C) (01/27 0752) Pulse Rate:  [63-91] 63 (01/27 0752) Resp:  [14-32] 16 (01/27 0752) BP: (110-153)/(43-114) 153/61 mmHg (01/27 0752) SpO2:  [93 %-100 %] 97 % (01/27 0752) Weight:  [213 lb (96.616 kg)-216 lb 0.8 oz (98 kg)] 216 lb 0.8 oz (98 kg) (01/27 0400)      24-hour weight change: Weight change:   Weight trends: Filed Weights   08/01/14 1308 08/02/14 0400  Weight: 213 lb (96.616 kg) 216 lb 0.8 oz (98 kg)    Intake/Output:  01/26 0701 - 01/27 0700 In: 567 [P.O.:240; I.V.:327] Out: 500 [Urine:500]     Physical Exam: BP 153/61 mmHg  Pulse 63  Temp(Src) 97.5 F (36.4 C) (Oral)  Resp 16  Ht 5\' 7"  (1.702 m)  Wt 216 lb 0.8 oz (98 kg)  BMI 33.83 kg/m2  SpO2 97%  Wt Readings from Last 3 Encounters:  08/02/14 216 lb 0.8 oz (98 kg)  08/01/14 213 lb 6.4 oz (96.798 kg)  07/17/14 217 lb 12.8 oz (98.793 kg)    General: Vital signs reviewed and noted.   Head: Normocephalic, atraumatic.  Eyes: conjunctivae/corneas clear.  EOM's intact.   Throat: normal  Neck:  normal   Lungs:    bilateral wheezing   Heart:   RR   Abdomen:  Soft, non-tender, non-distended    Extremities: Good pulses, no edema, no clubbing    Neurologic: A&O X3, CN II - XII are grossly intact.   Psych: Normal     Labs: BMET:  Recent Labs  08/01/14 1339 08/01/14 1816  NA 140 138  K 3.7 3.8  CL 104 102  CO2 25 27  GLUCOSE 104* 129*  BUN 15 13  CREATININE 0.67 0.62  CALCIUM 9.6 9.4  MG  --  2.0    Liver function tests:  Recent Labs   08/01/14 1339 08/01/14 1816  AST 25 23  ALT 21 19  ALKPHOS 81 77  BILITOT 0.8 0.8  PROT 7.8 7.6  ALBUMIN 3.8 3.6   No results for input(s): LIPASE, AMYLASE in the last 72 hours.  CBC:  Recent Labs  08/01/14 1339 08/01/14 1816 08/02/14 0515  WBC 8.8 7.7 7.3  NEUTROABS 5.6 4.3  --   HGB 12.7 12.5 11.7*  HCT 38.2 37.0 35.5*  MCV 92.0 92.0 92.7  PLT 421* 419* 365    Cardiac Enzymes:  Recent Labs  08/01/14 1816 08/01/14 2259 08/02/14 0515  TROPONINI <0.03 <0.03 <0.03    Coagulation Studies:  Recent Labs  08/01/14 1816  LABPROT 15.3*  INR 1.20    Other: Invalid input(s): POCBNP No results for input(s): DDIMER in the last 72 hours. No results for input(s): HGBA1C in the last 72 hours. No results for input(s): CHOL, HDL, LDLCALC, TRIG, CHOLHDL in the last 72 hours.  Recent Labs  08/01/14 1816  TSH 0.131*   No results for input(s): VITAMINB12, FOLATE, FERRITIN, TIBC, IRON, RETICCTPCT in the last 72 hours.   Other  results:  EKG;   NSR at 64.  No ST or T wave changes.     Medications:    Infusions: . sodium chloride 10 mL/hr (08/01/14 1755)  . heparin 1,100 Units/hr (08/02/14 0700)  . nitroGLYCERIN 10 mcg/min (08/02/14 0400)    Scheduled Medications: . amLODipine  5 mg Oral Daily  . aspirin EC  81 mg Oral Daily  . metoprolol succinate  50 mg Oral Daily    Assessment/ Plan:    1. CAD:    The patient has a hx of CAD and stenting.  She has had CP in addition to her symptoms of fever, dyspnea, wheezing, fatigue. She needs a right heart cath to evaluate her for pulmonary HTN and it would make sense to do a left heart cath to ensure that she does not have progressive CAD.  I have discussed with Dr. Angelena Form. Have also discussed the case with Dr. Martinique and Dr. Reynaldo Minium.   2. Unstable angina - troponins are negative so far  3. Pulmonary fibrosis- for right heart cath today  4. Essential HTN - continue current meds.   5. Viral URI.  She likely  also has a viral URI,  Dr. Reynaldo Minium will likely give her a steroid taper.   Disposition: right and left heart cath today  Length of Stay: 1  Thayer Headings, Brooke Bonito., MD, [facility] 08/02/2014, 8:10 AM Office [phone] Pager [phone]

## 2014-08-02 NOTE — Interval H&P Note (Signed)
History and Physical Interval Note:  08/02/2014 2:42 PM  Jody Ruiz  has presented today for cardiac cath with the diagnosis of CAD, chest pain.  The various methods of treatment have been discussed with the patient and family. After consideration of risks, benefits and other options for treatment, the patient has consented to  Procedure(s): LEFT AND RIGHT HEART CATHETERIZATION WITH CORONARY ANGIOGRAM (N/A) as a surgical intervention .  The patient's history has been reviewed, patient examined, no change in status, stable for surgery.  I have reviewed the patient's chart and labs.  Questions were answered to the patient's satisfaction.    Cath Lab Visit (complete for each Cath Lab visit)  Clinical Evaluation Leading to the Procedure:   ACS: No.  Non-ACS:    Anginal Classification: CCS III  Anti-ischemic medical therapy: Maximal Therapy (2 or more classes of medications)  Non-Invasive Test Results: No non-invasive testing performed  Prior CABG: No previous CABG        Jody Ruiz

## 2014-08-02 NOTE — Progress Notes (Signed)
PROGRESS NOTE Primary cardiologist : Martinique  Subjective:   74 yo with hx of CAD, hx of [place] in [place], last cath in Sept. 2014 showed patent stents.  Has had CP for 3 days, low energy, dyspnea. , increasing fatigue. Has had + lupus and ANA markers.  Has idopathic pulmonary fibrosis . Has had constant pain.  Troponin levels are negative     Objective:    Vital Signs:   Temp:  [97.5 F (36.4 C)-99.2 F (37.3 C)] 97.5 F (36.4 C) (01/27 0752) Pulse Rate:  [63-91] 63 (01/27 0752) Resp:  [14-32] 16 (01/27 0752) BP: (110-153)/(43-114) 153/61 mmHg (01/27 0752) SpO2:  [93 %-100 %] 97 % (01/27 0752) Weight:  [213 lb (96.616 kg)-216 lb 0.8 oz (98 kg)] 216 lb 0.8 oz (98 kg) (01/27 0400)      24-hour weight change: Weight change:   Weight trends: Filed Weights   08/01/14 1308 08/02/14 0400  Weight: 213 lb (96.616 kg) 216 lb 0.8 oz (98 kg)    Intake/Output:  01/26 0701 - 01/27 0700 In: 567 [P.O.:240; I.V.:327] Out: 500 [Urine:500]     Physical Exam: BP 153/61 mmHg  Pulse 63  Temp(Src) 97.5 F (36.4 C) (Oral)  Resp 16  Ht 5\' 7"  (1.702 m)  Wt 216 lb 0.8 oz (98 kg)  BMI 33.83 kg/m2  SpO2 97%  Wt Readings from Last 3 Encounters:  08/02/14 216 lb 0.8 oz (98 kg)  08/01/14 213 lb 6.4 oz (96.798 kg)  07/17/14 217 lb 12.8 oz (98.793 kg)    General: Vital signs reviewed and noted.   Head: Normocephalic, atraumatic.  Eyes: conjunctivae/corneas clear.  EOM's intact.   Throat: normal  Neck:  normal   Lungs:    bilateral wheezing   Heart:   RR   Abdomen:  Soft, non-tender, non-distended    Extremities: Good pulses, no edema, no clubbing    Neurologic: A&O X3, CN II - XII are grossly intact.   Psych: Normal     Labs: BMET:  Recent Labs  08/01/14 1339 08/01/14 1816  NA 140 138  K 3.7 3.8  CL 104 102  CO2 25 27  GLUCOSE 104* 129*  BUN 15 13  CREATININE 0.67 0.62  CALCIUM 9.6 9.4  MG  --  2.0    Liver function tests:  Recent Labs   08/01/14 1339 08/01/14 1816  AST 25 23  ALT 21 19  ALKPHOS 81 77  BILITOT 0.8 0.8  PROT 7.8 7.6  ALBUMIN 3.8 3.6   No results for input(s): LIPASE, AMYLASE in the last 72 hours.  CBC:  Recent Labs  08/01/14 1339 08/01/14 1816 08/02/14 0515  WBC 8.8 7.7 7.3  NEUTROABS 5.6 4.3  --   HGB 12.7 12.5 11.7*  HCT 38.2 37.0 35.5*  MCV 92.0 92.0 92.7  PLT 421* 419* 365    Cardiac Enzymes:  Recent Labs  08/01/14 1816 08/01/14 2259 08/02/14 0515  TROPONINI <0.03 <0.03 <0.03    Coagulation Studies:  Recent Labs  08/01/14 1816  LABPROT 15.3*  INR 1.20    Other: Invalid input(s): POCBNP No results for input(s): DDIMER in the last 72 hours. No results for input(s): HGBA1C in the last 72 hours. No results for input(s): CHOL, HDL, LDLCALC, TRIG, CHOLHDL in the last 72 hours.  Recent Labs  08/01/14 1816  TSH 0.131*   No results for input(s): VITAMINB12, FOLATE, FERRITIN, TIBC, IRON, RETICCTPCT in the last 72 hours.   Other  results:  EKG;   NSR at 64.  No ST or T wave changes.     Medications:    Infusions: . sodium chloride 10 mL/hr (08/01/14 1755)  . heparin 1,100 Units/hr (08/02/14 0700)  . nitroGLYCERIN 10 mcg/min (08/02/14 0400)    Scheduled Medications: . amLODipine  5 mg Oral Daily  . aspirin EC  81 mg Oral Daily  . metoprolol succinate  50 mg Oral Daily    Assessment/ Plan:    1. CAD:    The patient has a hx of CAD and stenting.  She has had CP in addition to her symptoms of fever, dyspnea, wheezing, fatigue. She needs a right heart cath to evaluate her for pulmonary HTN and it would make sense to do a left heart cath to ensure that she does not have progressive CAD.  I have discussed with Dr. Angelena Form. Have also discussed the case with Dr. Martinique and Dr. Reynaldo Minium.   2. Unstable angina - troponins are negative so far  3. Pulmonary fibrosis- for right heart cath today  4. Essential HTN - continue current meds.   5. Viral URI.  She likely  also has a viral URI,  Dr. Reynaldo Minium will likely give her a steroid taper.   Disposition: right and left heart cath today  Length of Stay: 1  Thayer Headings, Brooke Bonito., MD, [facility] 08/02/2014, 8:10 AM Office [phone] Pager [phone]

## 2014-08-02 NOTE — CV Procedure (Signed)
Cardiac Catheterization Operative Report  Jody Ruiz [id number] 1/27/20162:39 PM ARONSON,RICHARD A, MD  Procedure Performed:  1. Left Heart Catheterization 2. Selective Coronary Angiography 3. Right Heart Catheterization 4. Left ventricular angiogram  Operator: Lauree Chandler, MD  Indication:  74 yo female with history of CAD s/p prior stenting of the LAD, HTN, HLD, IPF admitted with chest pain. Negative cardiac markers.                                Procedure Details: The risks, benefits, complications, treatment options, and expected outcomes were discussed with the patient. The patient and/or family concurred with the proposed plan, giving informed consent. The patient was brought to the cath lab after IV hydration was begun and oral premedication was given. The patient was further sedated with Versed and Fentanyl. The right antecubital area was prepped and draped a sterile fashion. A wire was used to change the antecubital IV for a 5 [place] sheath.  A balloon tipped catheter was used to perform a right heart catheterization. The right wrist was prepped and draped in a sterile fashion. Modified Allens test was positive. Using the modified Seldinger access technique, a 5 French sheath was placed in the right radial artery. 3 mg Verapamil was given. 5000 Units IV heparin was given. Standard diagnostic catheters were used to perform selective coronary angiography. A pigtail catheter was used to perform a left ventricular angiogram. There were no immediate complications. The patient was taken to the recovery area in stable condition.   Hemodynamic Findings: Ao: 156/65             LV: 171/0/13 RA:  4           RV: 29/1/5 PA:  29/10 (mean 17)    PCWP: 12 Fick Cardiac Output: 8.74 L/min Fick Cardiac Index: 4.1 L/min/m2 Central Aortic Saturation: 92% Pulmonary Artery Saturation: 72%  Angiographic Findings:  Left main: No obstructive disease noted.   Left  Anterior Descending Artery: Large caliber vessel that courses to the apex. Patent proximal and mid stents with no restenosis. There is diffuse 60% stenosis in the very small caliber LAD as it wraps the apex. There are several very small caliber diagonal branches with mild plaque disease. No change since last cath.   Circumflex Artery: The left circumflex coronary gives rise to 2 marginal branches. The circumflex has mild nonobstructive disease up to 20%. The first marginal branch is a small caliber vessel with 40% focal stenosis at the ostium. The second marginal branch has 20% stenosis in the mid vessel.  Right Coronary Artery: Large dominant vessel. There is a 30% stenosis in the proximal vessel. There is mild calcific plaque in the mid vessel. The PDA is a small caliber branch with 30% proximal stenosis. The posterolateral branch is a small caliber vessel with serial 50-60% stenoses in the mid segment. This does not appear to be flow limiting.   Left Ventricular Angiogram: LVEF=60-65%.   Impression: 1. Double vessel CAD with patent stents LAD 2. Moderate stenosis in small caliber posterolateral branch that does not appear to be flow limiting.  3. Normal LV systolic function 4. Non-cardiac chest pain 5. Normal filling pressures  Recommendations: Continue medical management of CAD. I do not think I can explain her chest pain based on the degree of CAD.        Complications:  None; patient tolerated the procedure well.

## 2014-08-02 NOTE — Consult Note (Signed)
Patient well known to me.  Seen and examined.  I have not been involved in recent pulmonary or Rheumatologic work-up.  This having been said--baseline radiographic basilar pulmonary fibrosis, low titer ANA and known cardiac disease.  Two week viral bronchitic illness slowly improving but now confounded with episodic chest pain responsive to ntg and asa.  Believe will need cardiac cath at this point to definitively rule out obstructive disease as cause of some of this clinical picture.  Assuming clear, CT angio to r/o PE would also be helpful--daughter has had hypercoagulable state and frequent PEs.  Finally Echo to evaluate pericardium.  Doubt that SLE is playing a significant role in this and we can continue to address this outpatient.  Will discuss with cardiology and follow.

## 2014-08-03 ENCOUNTER — Inpatient Hospital Stay (HOSPITAL_COMMUNITY): Payer: Medicare Other

## 2014-08-03 ENCOUNTER — Encounter (HOSPITAL_COMMUNITY): Payer: Self-pay | Admitting: Cardiovascular Disease

## 2014-08-03 DIAGNOSIS — J849 Interstitial pulmonary disease, unspecified: Secondary | ICD-10-CM | POA: Diagnosis present

## 2014-08-03 DIAGNOSIS — R079 Chest pain, unspecified: Secondary | ICD-10-CM

## 2014-08-03 DIAGNOSIS — E785 Hyperlipidemia, unspecified: Secondary | ICD-10-CM | POA: Diagnosis present

## 2014-08-03 DIAGNOSIS — R0600 Dyspnea, unspecified: Secondary | ICD-10-CM

## 2014-08-03 DIAGNOSIS — R4182 Altered mental status, unspecified: Secondary | ICD-10-CM

## 2014-08-03 DIAGNOSIS — R072 Precordial pain: Secondary | ICD-10-CM

## 2014-08-03 DIAGNOSIS — I2510 Atherosclerotic heart disease of native coronary artery without angina pectoris: Secondary | ICD-10-CM | POA: Diagnosis present

## 2014-08-03 LAB — COMPREHENSIVE METABOLIC PANEL
ALT: 19 U/L (ref 0–35)
ANION GAP: 6 (ref 5–15)
AST: 22 U/L (ref 0–37)
Albumin: 3.4 g/dL — ABNORMAL LOW (ref 3.5–5.2)
Alkaline Phosphatase: 73 U/L (ref 39–117)
BUN: 10 mg/dL (ref 6–23)
CALCIUM: 8.8 mg/dL (ref 8.4–10.5)
CO2: 27 mmol/L (ref 19–32)
Chloride: 104 mmol/L (ref 96–112)
Creatinine, Ser: 0.68 mg/dL (ref 0.50–1.10)
GFR calc Af Amer: >90 mL/min (ref >90)
GFR, EST NON AFRICAN AMERICAN: 86 mL/min — AB (ref >90)
GLUCOSE: 136 mg/dL — AB (ref 70–99)
POTASSIUM: 3.7 mmol/L (ref 3.5–5.1)
Sodium: 137 mmol/L (ref 135–145)
TOTAL PROTEIN: 7 g/dL (ref 6.0–8.3)
Total Bilirubin: 0.5 mg/dL (ref 0.3–1.2)

## 2014-08-03 LAB — CBC
HCT: 36.2 % (ref 36.0–46.0)
HEMOGLOBIN: 12.2 g/dL (ref 12.0–15.0)
MCH: 31 pg (ref 26.0–34.0)
MCHC: 33.7 g/dL (ref 30.0–36.0)
MCV: 92.1 fL (ref 78.0–100.0)
PLATELETS: 350 10*3/uL (ref 150–400)
RBC: 3.93 MIL/uL (ref 3.87–5.11)
RDW: 13.1 % (ref 11.5–15.5)
WBC: 10.5 10*3/uL (ref 4.0–10.5)

## 2014-08-03 LAB — PROTIME-INR
INR: 1 (ref 0.00–1.49)
PROTHROMBIN TIME: 13.3 s (ref 11.6–15.2)

## 2014-08-03 LAB — GLUCOSE, CAPILLARY: GLUCOSE-CAPILLARY: 124 mg/dL — AB (ref 70–99)

## 2014-08-03 LAB — POCT I-STAT 3, ART BLOOD GAS (G3+)
ACID-BASE DEFICIT: 1 mmol/L (ref 0.0–2.0)
BICARBONATE: 23.4 meq/L (ref 20.0–24.0)
O2 SAT: 91 %
PCO2 ART: 37.1 mmHg (ref 35.0–45.0)
TCO2: 24 mmol/L (ref 0–100)
pH, Arterial: 7.407 (ref 7.350–7.450)
pO2, Arterial: 61 mmHg — ABNORMAL LOW (ref 80.0–100.0)

## 2014-08-03 LAB — APTT: aPTT: 25 seconds (ref 24–37)

## 2014-08-03 MED ORDER — NITROGLYCERIN 0.4 MG SL SUBL: 0.4000 mg | SUBLINGUAL_TABLET | SUBLINGUAL | Status: AC | PRN

## 2014-08-03 MED ORDER — PREDNISONE (PAK) 10 MG PO TABS: ORAL_TABLET | ORAL | Status: DC

## 2014-08-03 NOTE — Consult Note (Signed)
Consult Reason for Consult:altered mental status Referring Physician: Dr Halford Chessman  CC: altered mental status  HPI: Jody Ruiz is an 74 y.o. female history of CAD, HTN, HLD, pulmonary fibrosis admitted with chest pain. Had negative cardiac cath on 1/27 and was in her normal state of health up until midnight when she became acutely unresponsive. Per RN, had eyes open, staring upward, nonverbal, some moaning. Had severe coughing spell. Not following commands, no spontaneous movement, no extremity twitching, no response to noxious stimuli. ABG showed oxygen level of 61 but otherwise unremarkable labs. She has not received any sedating medications.   Patient went for stat MRI. During transport mental status slowly improved and she returned to baseline mental status. MRI imaging reviewed and overall unremarkable.  Past Medical History  Diagnosis Date  . Anemia   . Hx of adenomatous colonic polyps   . CAD (coronary artery disease)     a. 04/2012 NSTEMI/Cath:  pLAD 90%, mLAD 70-80% (long) (3x16 & 2.5x28 Promus DES to p/m LAD), pD1 70-80%, pRI 30% (small), CFX 20%, OM1 40%, mOM2 30%, pRCA 30%, PDA 30-40%, pPLB 70%, then mid 90%, EF 65%;  b. 03/2013 Abnl CL w/apical isch;  c. 04/2013 Cath: LM nl, LAD patent stents, LCX <20, RCA 30p PDA 40-50 EF 55-60% ->Cont Med Rx.  Marland Kitchen Hypertension   . Microscopic colitis   . Arthritis   . Obesity   . HLD (hyperlipidemia)     intolerant to statins  . Dyspnea     a. CP and SOB 06/2012 => Ticagrelor changed to Plavix  . Hx of echocardiogram     a. Echo 2/14:  mild LVH, EF 55-65%, Gr 1 diast dysfn, mild LAE  . OSA (obstructive sleep apnea)     a. on CPAP  . Internal hemorrhoids   . Idiopathic pulmonary fibrosis   . Lung nodules     Past Surgical History  Procedure Laterality Date  . Appendectomy    . Cholecystectomy    . Ventral hernia repair    . Breast biopsy      bilateral  . Knee arthroscopy      bilateral  . Vaginal polypectomy    . Shoulder  arthroscopy  2012    left  . Cardiac catheterization      x4  . Coronary stent placement  04/2012    x2  . Left heart catheterization with coronary angiogram N/A 04/29/2012    Procedure: LEFT HEART CATHETERIZATION WITH CORONARY ANGIOGRAM;  Surgeon: Shirlean Berman M Martin[place], MD;  Location: [facility] CATH LAB;  Service: Cardiovascular;  Laterality: N/A;  . Percutaneous coronary stent intervention (pci-s) N/A 04/29/2012    Procedure: PERCUTANEOUS CORONARY STENT INTERVENTION (PCI-S);  Surgeon: Shalaine Payson M [place], MD;  Location: [facility] CATH LAB;  Service: Cardiovascular;  Laterality: N/A;  . Left heart catheterization with coronary angiogram N/A 08/03/2012    Procedure: LEFT HEART CATHETERIZATION WITH CORONARY ANGIOGRAM;  Surgeon: Burnell Blanks, MD;  Location: [facility] CATH LAB;  Service: Cardiovascular;  Laterality: N/A;  . Left heart catheterization with coronary angiogram N/A 04/12/2013    Procedure: LEFT HEART CATHETERIZATION WITH CORONARY ANGIOGRAM;  Surgeon: Geniene List M [place], MD;  Location: [facility] CATH LAB;  Service: Cardiovascular;  Laterality: N/A;  . Left and right heart catheterization with coronary angiogram N/A 08/02/2014    Procedure: LEFT AND RIGHT HEART CATHETERIZATION WITH CORONARY ANGIOGRAM;  Surgeon: Burnell Blanks, MD;  Location: [facility] CATH LAB;  Service: Cardiovascular;  Laterality: N/A;    Family History  Problem  Relation Age of Onset  . Breast cancer Sister   . Prostate cancer Father   . Colon cancer Sister   . Colon polyps Father   . Heart disease Mother 23    Died acute MI  . Heart disease Father 80    CAD, died of ESRD  . Heart disease Brother 28    CABG  . Kidney disease Father     Social History:  reports that she has never smoked. She has never used smokeless tobacco. She reports that she does not drink alcohol or use illicit drugs.  Allergies  Allergen Reactions  . Brilinta [Ticagrelor] Shortness Of Breath and Cough    fatigue  . Lisinopril Shortness Of Breath and Cough  .  Statins     Muscle Weakness  . Zetia [Ezetimibe]     Myalgias   . Macrodantin [Nitrofurantoin Macrocrystal] Rash  . Sulfa Antibiotics Rash    Medications:  Scheduled: . amLODipine  5 mg Oral Daily  . aspirin EC  81 mg Oral Daily  . isosorbide mononitrate  60 mg Oral Daily  . metoprolol succinate  50 mg Oral Daily     ROS: Out of a complete 14 system review, the patient complains of only the following symptoms, and all other reviewed systems are negative. +fatigue Physical Examination: Filed Vitals:   08/03/14 0000  BP: 114/36  Pulse: 73  Temp:   Resp: 20   Physical Exam  Constitutional: He appears well-developed and well-nourished.  Psych: Affect appropriate to situation Eyes: No scleral injection HENT: No OP obstrucion Head: Normocephalic.  Cardiovascular: Normal rate and regular rhythm.  Respiratory: Effort normal and breath sounds normal.  GI: Soft. Bowel sounds are normal. No distension. There is no tenderness.  Skin: WDI  Neurologic Examination (exam completed prior to improvement in mental status) Mental Status: Eyes closed but responsive to noxious stimuli, will open eyes and yell. Not following commands. No verbal output besides for moans/yelling Cranial Nerves: II: unable to assess fundi, , pupils equal, round, reactive to light  III,IV, VI: ptosis not present, eyes midline, no gaze deviation V,VII: face symmetric,  IX,X: gag reflex present XI: unable to test XII: unable to test Motor: No spontaneous movement, will withdrawal to noxious stimuli symmetrically in all extremities Tone and bulk:normal tone throughout; no atrophy noted Sensory: withdrawals to noxious stimuli in all extremities Deep Tendon Reflexes: 2+ and symmetric throughout Plantars: Right: downgoing   Left: downgoing Cerebellar: Unable to test Gait: unable to test  Laboratory Studies:   Basic Metabolic Panel:  Recent Labs Lab 08/01/14 1339 08/01/14 1816 08/03/14 0210   NA 140 138 137  K 3.7 3.8 3.7  CL 104 102 104  CO2 25 27 27   GLUCOSE 104* 129* 136*  BUN 15 13 10   CREATININE 0.67 0.62 0.68  CALCIUM 9.6 9.4 8.8  MG  --  2.0  --     Liver Function Tests:  Recent Labs Lab 08/01/14 1339 08/01/14 1816 08/03/14 0210  AST 25 23 22   ALT 21 19 19   ALKPHOS 81 77 73  BILITOT 0.8 0.8 0.5  PROT 7.8 7.6 7.0  ALBUMIN 3.8 3.6 3.4*   No results for input(s): LIPASE, AMYLASE in the last 168 hours. No results for input(s): AMMONIA in the last 168 hours.  CBC:  Recent Labs Lab 08/01/14 1339 08/01/14 1816 08/02/14 0515 08/03/14 0210  WBC 8.8 7.7 7.3 10.5  NEUTROABS 5.6 4.3  --   --   HGB 12.7 12.5 11.7*  12.2  HCT 38.2 37.0 35.5* 36.2  MCV 92.0 92.0 92.7 92.1  PLT 421* 419* 365 350    Cardiac Enzymes:  Recent Labs Lab 08/01/14 1816 08/01/14 2259 08/02/14 0515  TROPONINI <0.03 <0.03 <0.03    BNP: Invalid input(s): POCBNP  CBG:  Recent Labs Lab 08/03/14 0153  GLUCAP 76*    Microbiology: Results for orders placed or performed during the hospital encounter of 08/01/14  MRSA PCR Screening     Status: None   Collection Time: 08/01/14  5:20 PM  Result Value Ref Range Status   MRSA by PCR NEGATIVE NEGATIVE Final    Comment:        The GeneXpert MRSA Assay (FDA approved for NASAL specimens only), is one component of a comprehensive MRSA colonization surveillance program. It is not intended to diagnose MRSA infection nor to guide or monitor treatment for MRSA infections.     Coagulation Studies:  Recent Labs  08/01/14 1816  LABPROT 15.3*  INR 1.20    Urinalysis: No results for input(s): COLORURINE, LABSPEC, PHURINE, GLUCOSEU, HGBUR, BILIRUBINUR, KETONESUR, PROTEINUR, UROBILINOGEN, NITRITE, LEUKOCYTESUR in the last 168 hours.  Invalid input(s): APPERANCEUR  Lipid Panel:     Component Value Date/Time   CHOL 165 04/29/2012 0420   TRIG 132 04/29/2012 0420   HDL 43 04/29/2012 0420   CHOLHDL 3.8 04/29/2012 0420    VLDL 26 04/29/2012 0420   LDLCALC 96 04/29/2012 0420    HgbA1C: No results found for: HGBA1C  Urine Drug Screen:  No results found for: LABOPIA, COCAINSCRNUR, LABBENZ, AMPHETMU, THCU, LABBARB  Alcohol Level: No results for input(s): ETH in the last 168 hours.  Other results:  Imaging: X-ray Chest Pa And Lateral  08/01/2014   CLINICAL DATA:  Chest pain for 1 day  EXAM: CHEST  2 VIEW  COMPARISON:  08/01/2014 1359 hrs  FINDINGS: Cardiac shadow is stable. Elevation of the right hemidiaphragm is again seen. The lungs are well aerated bilaterally. Some scarring is noted in the bases bilaterally stable from the prior CT examination.  IMPRESSION: No acute abnormality is noted.  Bibasilar scarring is seen.   Electronically Signed   By: Inez Catalina M.D.   On: 08/01/2014 21:19   Dg Chest Port 1 View  08/01/2014   CLINICAL DATA:  Chest pain, left upper chest pain for 6 days, dizziness, coronary artery disease  EXAM: PORTABLE CHEST - 1 VIEW  COMPARISON:  07/17/2014  FINDINGS: There is elevation of the right diaphragm. The heart size and mediastinal contours are within normal limits. There is no focal consolidation, pleural effusion or pneumothorax. Is bibasilar fibrosis. The visualized skeletal structures are unremarkable.  IMPRESSION: No active disease.   Electronically Signed   By: Kathreen Devoid   On: 08/01/2014 14:11     Assessment/Plan:  73y/o woman admitted with chest pain s/p negative cardiac cath with acute change in mental status. Unclear etiology. MRI brain negative for acute process. Abrupt onset with gradual return to baseline raises question of possible seizure/post-ictal state. Would consider possible sundowning though this would be an atypical presentation. Mental status currently back to baseline.  -check EEG -avoid sedating medications   Jim Like, DO [facility] [phone]  If 7pm- 7am, please page neurology on call as listed in AMION. 08/03/2014, 3:00 AM

## 2014-08-03 NOTE — Progress Notes (Signed)
[facility] Progress Note Patient Name: Jody Ruiz DOB: 26-Nov-1940 MRN: [id number]   Date of Service  08/03/2014  HPI/Events of Note  Pt noted to have change in mental status.  She is not following commands, and only mumbles with stimulation.  She will also withdraw extremities with stimulation.  She has not received sedative or narcotic medications.  No reported hx of seizures.   eICU Interventions  Will check CT head w/o contrast, labs.  Primary team notified by bedside staff, and neurology consult requested.      Intervention Category Major Interventions: Other:  Kalib Bhagat 08/03/2014, 1:35 AM

## 2014-08-03 NOTE — Progress Notes (Signed)
  Echocardiogram 2D Echocardiogram has been performed.  Adonte Vanriper FRANCES 08/03/2014, 3:57 PM

## 2014-08-03 NOTE — Progress Notes (Signed)
EEG completed; results pending.    

## 2014-08-03 NOTE — Progress Notes (Signed)
Pt found by charge nurse at around [place] with a drastic change in LOC. Pt was last checked by me at midnight and was at her baseline. Pt was A/Ox4 and had requested to get up to go to the restroom and was able to walk to the bathroom independently. Pt was also cutting up and making jokes. The Nurse Tech was also in pt's room around midnight and also found her to be at her baseline. At [place] charge nurse entered the room and found patient very lethargic and would not answer charge nurse's questions. Pt was also having a violent coughing episode when charge nurse found her and when I entered the room. Pt was non-verbal and would not follow my commands; she would not grip my fingers or hold up her arms when asked. Pt was responsive to pain only. Pupils were checked and were equal and reactive. Dr. Radford Pax was the on-call Cards MD and was paged. Per Dr. Theodosia Blender order Neuro was also paged and came up to see pt. CCM was also called and ordered labs. Pt's vital signs had not significantly changed from when she was at baseline; BP was in 160's but was only change noted. O2=93-95 on RA, Resp=20, HR=90's.    Neuro assessed pt, called a Code Stroke due to her condition, and ordered an MRI. We called rapid response who met [place] at the elevators on our way to MRI; Neuro MD also accompanied [place] down. On our way down to MRI pt started becoming more verbal but was still not quite making sense. On arrival to MRI pt was able to tell radiology that she had stents placed in the past but was still drowsy. MRI was completed and per Neuro MD, results looked negative. On the way back up to pt's room, pt was A/Ox4; she was again joking with [place] and was found to be back to her baseline. No additional orders have been given at this time and will continue to watch pt.

## 2014-08-03 NOTE — Progress Notes (Signed)
Discharge paper work gone over with patient and family. IVs removed and belongings sent home with pt. Will d/c as soon as belongings gathered.

## 2014-08-03 NOTE — Procedures (Signed)
ELECTROENCEPHALOGRAM REPORT  [name]       Room #: 4H67 EEG No. ID: [id number] Age: 74 y.o.        Sex: female Referring Physician: [place], P Report Date:  08/03/2014        Interpreting Physician: Anthony Sar  History: [name] is an 74 y.o. female 74 year old lady admitted for chest pain with negative cardiac workup. Patient expressed an episode of unresponsiveness with staring. Patient has no history of seizures.  Indications for study:  Rule out new-onset seizure disorder.  Technique: This is an 18 channel routine scalp EEG performed at the bedside with bipolar and monopolar montages arranged in accordance to the international 10/20 system of electrode placement.   Description: This EEG recording was performed during wakefulness and during sleep. Predominant background activity during wakefulness consisted of 10-11 Hz symmetrical alpha rhythm with good attenuation of alpha activity with eye opening. Photic stimulation produced symmetrical occipital driving response. Hyperventilation was not performed. There was slowing of background activity diffusely with mixed delta and theta activity during drowsiness and sleep. Symmetrical vertex waves, sleep spindles and K-complexes are recorded during stage II of sleep. No epileptiform discharges corded during wakefulness nor during sleep.  Interpretation: This is a normal EEG recording during wakefulness and during sleep. No evidence of an epileptic disorder was demonstrated.   Rush Farmer M.D. [facility] [phone]

## 2014-08-03 NOTE — Discharge Summary (Signed)
Discharge Summary   Patient ID: Jody Ruiz MRN: [id number], DOB/AGE: 03-29-1941 74 y.o. Admit date: 08/01/2014 D/C date:     08/03/2014  Primary Cardiologist: Dr. Martinique  Principal Problem:   Midsternal chest pain Active Problems:   Acute bronchitis   Hypertension   Obesity   OSA (obstructive sleep apnea)   Altered mental status   CAD (coronary artery disease)   Interstitial lung disease   HLD (hyperlipidemia)   Admission Dates: 08/01/14- 07/24/14 Discharge Diagnosis: Chest pain s/p stable L/RHC  HPI: Jody Ruiz is a 74 y.o. female with a history of CAD s/p prior stenting of the LAD, HTN, HLD, IPF who was admitted to [facility] on 08/01/14 with chest pain.  She has a hx of MI in 04/2012 with stenting to LAD. She underwent cardiac catheterization in 07/2012 and again in 04/2013 which both showed patent stents and stable disease. She presented complaining of CP for 3 days associated with low energy, dyspnea and increasing fatigue. Looks like she was tested for autoimmune disorder- +lupus, +ANA and elevated sed rate as well. Referred to rheumatology and saw them on 07/10/2014 - but she has not heard back from them about the extensive labs that were drawn.   Hospital Course  CAD:The patient has a hx of CAD and stenting with multiple subsequent LHCs for chest pain which have showed patent stents and stable disease. -- Troponin remained negative -- S/p L/RHC on 08/02/14 which revealed   Impression:   1. Double vessel CAD with patent stents LAD  2. Moderate stenosis in small caliber posterolateral branch that does not appear to be flow limiting.   3. Normal LV systolic function  4. Non-cardiac chest pain  5. Normal filling pressures -- Continue medical management of CAD. I do not think I can explain her chest pain based on the degree of CAD.  -- 2D ECHO done right before discharge but will not delay discharge for results.   Pulmonary fibrosis- had normal right heart pressures    -- Followed by pulmonology   Essential HTN - continue current meds.   Viral URI- She likely also has a viral URI,  -- Will send her home with a 12 day prednisone dose pack per Dr. Reynaldo Minium. This has been called into her pharmacy  Possible autoimmune process- follow up with rheumatology  AMS- there was some question of some mental status changes but these have resolved. -- MRI was negative and EEG was normal.  Elevated DDimer- 0.72 on 07/16/14. Dr. Reynaldo Minium had suggested a CT angio to r/o PE  As her daughter has had hypercoagulable state and frequent PEs. Patient was reluctant to do this as she just received contrast from her cardiac cath. Dr. Reynaldo Minium was okay with her not getting CTA given that her right heart pressures were normal on RHC yesterday.    The patient has had an uncomplicated hospital course and is recovering well. The radial catheter site is stable. She has been seen by Dr. Acie Fredrickson today and deemed ready for discharge home. All follow-up appointments have been scheduled. Discharge medications are listed below. No changes were made to her medical regimen.    Discharge Vitals: Blood pressure 149/59, pulse 76, temperature 98.1 F (36.7 C), temperature source Oral, resp. rate 19, height 5\' 7"  (1.702 m), weight 216 lb 0.8 oz (98 kg), SpO2 96 %.  Labs: Lab Results  Component Value Date   WBC 10.5 08/03/2014   HGB 12.2 08/03/2014   HCT 36.2 08/03/2014  MCV 92.1 08/03/2014   PLT 350 08/03/2014     Recent Labs Lab 08/03/14 0210  NA 137  K 3.7  CL 104  CO2 27  BUN 10  CREATININE 0.68  CALCIUM 8.8  PROT 7.0  BILITOT 0.5  ALKPHOS 73  ALT 19  AST 22  GLUCOSE 136*    Recent Labs  08/01/14 1816 08/01/14 2259 08/02/14 0515  TROPONINI <0.03 <0.03 <0.03    Diagnostic Studies/Procedures   X-ray Chest Pa And Lateral  08/01/2014   CLINICAL DATA:  Chest pain for 1 day  EXAM: CHEST  2 VIEW  COMPARISON:  08/01/2014 1359 hrs  FINDINGS: Cardiac shadow is stable.  Elevation of the right hemidiaphragm is again seen. The lungs are well aerated bilaterally. Some scarring is noted in the bases bilaterally stable from the prior CT examination.  IMPRESSION: No acute abnormality is noted.  Bibasilar scarring is seen.   Electronically Signed   By: Inez Catalina M.D.   On: 08/01/2014 21:19   Dg Chest 2 View  07/17/2014   CLINICAL DATA:  Five days of cough congestion shortness of breath chest pain and fever ; history of previous MI with stent placement, bronchitis and pneumonia, nonsmoker  EXAM: CHEST  2 VIEW  COMPARISON:  PA and lateral chest x-ray of July 16, 2013 and CT scan of the chest of June 22, 2014  FINDINGS: There is chronic elevation of the right hemidiaphragm. Stable increased density in the posterior costophrenic gutters bilaterally is consistent with known fibrosis and honeycombing here. There is no significant pleural effusion. The heart and pulmonary vascularity are normal. There is tortuosity of the descending thoracic aorta. The trachea is midline. The bony thorax exhibits no acute abnormalities.  IMPRESSION: There are chronic fibrotic changes at the lung bases. There is no focal pneumonia nor evidence of CHF. The clinical findings may reflect acute bronchitis.   Electronically Signed   By: David  Martinique   On: 07/17/2014 14:10   Mr Brain Wo Contrast  08/03/2014   CLINICAL DATA:  Initial evaluation for altered mental status, not following commands, inability to move upper extremities.  EXAM: MRI HEAD WITHOUT CONTRAST  TECHNIQUE: Multiplanar, multiecho pulse sequences of the brain and surrounding structures were obtained without intravenous contrast.  COMPARISON:  None.  FINDINGS: Mild diffuse prominence of the CSF containing spaces is compatible with generalized cerebral atrophy. Mild patchy and scattered T2/FLAIR hyperintensity within the periventricular white matter most consistent with chronic small vessel ischemic disease, mild for patient age. Several  T2 hyperintense cystic lesions within the white matter of the left corona radiata are favored to reflect small dilated perivascular spaces rather remote lacunar infarcts.  No mass lesion or midline shift. Ventricles are normal in size without evidence of hydrocephalus. No extra-axial fluid collection.  No abnormal foci of restricted diffusion to suggest acute intracranial infarct. Gray-white matter differentiation maintained. Normal intravascular flow voids are present. No intracranial hemorrhage.  Craniocervical junction is normal. Pituitary gland within normal limits. No acute abnormality seen about the orbits.  Paranasal sinuses are well pneumatized and free of fluid. No mastoid effusion. Inner ear structures are normal.  Mild degenerative changes noted within the visualized upper cervical spine. Bone marrow signal intensity is normal. Scalp soft tissues within normal limits.  IMPRESSION: 1. No acute intracranial infarct or other abnormality identified. 2. Mild atrophy with chronic microvascular ischemic disease.   Electronically Signed   By: Jeannine Boga M.D.   On: 08/03/2014 03:25   Dg Chest  Port 1 View  08/01/2014   CLINICAL DATA:  Chest pain, left upper chest pain for 6 days, dizziness, coronary artery disease  EXAM: PORTABLE CHEST - 1 VIEW  COMPARISON:  07/17/2014  FINDINGS: There is elevation of the right diaphragm. The heart size and mediastinal contours are within normal limits. There is no focal consolidation, pleural effusion or pneumothorax. Is bibasilar fibrosis. The visualized skeletal structures are unremarkable.  IMPRESSION: No active disease.   Electronically Signed   By: Kathreen Devoid   On: 08/01/2014 14:11      Cardiac Catheterization Operative Report Left Ventricular Angiogram: LVEF=60-65%.  Impression: 1. Double vessel CAD with patent stents LAD 2. Moderate stenosis in small caliber posterolateral branch that does not appear to be flow limiting.  3. Normal LV systolic  function 4. Non-cardiac chest pain 5. Normal filling pressures Recommendations: Continue medical management of CAD. I do not think I can explain her chest pain based on the degree of CAD.      Discharge Medications     Medication List    TAKE these medications        acetaminophen 500 MG tablet  Commonly known as:  TYLENOL  Take 1,000 mg by mouth daily as needed for fever or headache.     amLODipine 5 MG tablet  Commonly known as:  NORVASC  TAKE 1 TABLET ONCE A DAY     aspirin EC 81 MG tablet  Take 81 mg by mouth daily.     isosorbide mononitrate 60 MG 24 hr tablet  Commonly known as:  IMDUR  TAKE ONE TABLET AT BEDTIME     metaxalone 800 MG tablet  Commonly known as:  SKELAXIN  Take 1 tablet by mouth daily as needed for muscle spasms.     nitroGLYCERIN 0.4 MG SL tablet  Commonly known as:  NITROSTAT  Place 1 tablet (0.4 mg total) under the tongue every 5 (five) minutes as needed for chest pain.     nitroGLYCERIN 0.4 MG SL tablet  Commonly known as:  NITROSTAT  Place 1 tablet (0.4 mg total) under the tongue every 5 (five) minutes x 3 doses as needed for chest pain.     polyvinyl alcohol 1.4 % ophthalmic solution  Commonly known as:  LIQUIFILM TEARS  Place 1 drop into both eyes as needed. For dry eyes     predniSONE 10 MG tablet  Commonly known as:  STERAPRED UNI-PAK  TAKE AS DIRECTED ( this was verbally called into your pharmacy )     TOPROL XL 50 MG 24 hr tablet  Generic drug:  metoprolol succinate  Take 1 tablet (50 mg total) by mouth daily. Take with or immediately following a meal.        Disposition   The patient will be discharged in stable condition to home.  Follow-up Information    Follow up with Peter Martinique, MD On 10/16/2014.   Specialty:  Cardiology   Why:  @ 2pm   Contact information:   [address] [place] [address] [phone]         Duration of Discharge Encounter: Greater than 30 minutes including  physician and PA time.  Mable Fill R PA-C 08/03/2014, 3:22 PM   Attending Note:   The patient was seen and examined.  Agree with assessment and plan as noted above.  Changes made to the above note as needed.  See my note from earlier today   Ramond Dial., MD, [facility]  08/03/2014, 5:16 PM [address],  [place] Pager [phone]

## 2014-08-03 NOTE — Progress Notes (Signed)
PROGRESS NOTE Primary cardiologist : Jody Ruiz  Subjective:   74 yo with hx of CAD, hx of MI in [place], last cath in Sept. 2014 showed patent stents.  Has had CP for 3 days, low energy, dyspnea. , increasing fatigue. Has had + lupus and ANA markers.  Has idopathic pulmonary fibrosis . Has had constant pain.  Troponin levels are negative   Right and left heart cath looked good yesterday There was some question of some mental status changes but these have resolved. MRI was negative.  EEG was normal. She is ready to go home from our standpoint.     Objective:    Vital Signs:   Temp:  [97.9 F (36.6 C)-98.9 F (37.2 C)] 98.7 F (37.1 C) (01/28 0752) Pulse Rate:  [62-100] 100 (01/28 0800) Resp:  [14-26] 23 (01/28 0800) BP: (99-160)/(22-86) 137/60 mmHg (01/28 0800) SpO2:  [92 %-99 %] 97 % (01/28 0800)      24-hour weight change: Weight change: 3 lb 0.8 oz (1.384 kg)  Weight trends: Filed Weights   08/01/14 1308 08/02/14 0400 08/02/14 1000  Weight: 213 lb (96.616 kg) 216 lb 0.8 oz (98 kg) 216 lb 0.8 oz (98 kg)    Intake/Output:  01/27 0701 - 01/28 0700 In: 413.3 [I.V.:413.3] Out: -  Total I/O In: 250 [P.O.:250] Out: -    Physical Exam: BP 137/60 mmHg  Pulse 100  Temp(Src) 98.7 F (37.1 C) (Oral)  Resp 23  Ht 5\' 7"  (1.702 m)  Wt 216 lb 0.8 oz (98 kg)  BMI 33.83 kg/m2  SpO2 97%  Wt Readings from Last 3 Encounters:  08/02/14 216 lb 0.8 oz (98 kg)  08/01/14 213 lb 6.4 oz (96.798 kg)  07/17/14 217 lb 12.8 oz (98.793 kg)    General: Vital signs reviewed and noted.   Head: Normocephalic, atraumatic.  Eyes: conjunctivae/corneas clear.  EOM's intact.   Throat: normal  Neck:  normal   Lungs:    bilateral wheezing   Heart:   RR   Abdomen:  Soft, non-tender, non-distended    Extremities: Good pulses, no edema, no clubbing , right radial and right brachial sites look good.    Neurologic: A&O X3, CN II - XII are grossly intact.   Psych: Normal      Labs: BMET:  Recent Labs  08/01/14 1816 08/03/14 0210  NA 138 137  K 3.8 3.7  CL 102 104  CO2 27 27  GLUCOSE 129* 136*  BUN 13 10  CREATININE 0.62 0.68  CALCIUM 9.4 8.8  MG 2.0  --     Liver function tests:  Recent Labs  08/01/14 1816 08/03/14 0210  AST 23 22  ALT 19 19  ALKPHOS 77 73  BILITOT 0.8 0.5  PROT 7.6 7.0  ALBUMIN 3.6 3.4*   No results for input(s): LIPASE, AMYLASE in the last 72 hours.  CBC:  Recent Labs  08/01/14 1339 08/01/14 1816 08/02/14 0515 08/03/14 0210  WBC 8.8 7.7 7.3 10.5  NEUTROABS 5.6 4.3  --   --   HGB 12.7 12.5 11.7* 12.2  HCT 38.2 37.0 35.5* 36.2  MCV 92.0 92.0 92.7 92.1  PLT 421* 419* 365 350    Cardiac Enzymes:  Recent Labs  08/01/14 1816 08/01/14 2259 08/02/14 0515  TROPONINI <0.03 <0.03 <0.03    Coagulation Studies:  Recent Labs  08/01/14 1816 08/03/14 0210  LABPROT 15.3* 13.3  INR 1.20 1.00    Other: Invalid input(s): POCBNP No results  for input(s): DDIMER in the last 72 hours. No results for input(s): HGBA1C in the last 72 hours. No results for input(s): CHOL, HDL, LDLCALC, TRIG, CHOLHDL in the last 72 hours.  Recent Labs  08/01/14 1816  TSH 0.131*   No results for input(s): VITAMINB12, FOLATE, FERRITIN, TIBC, IRON, RETICCTPCT in the last 72 hours.   Other results:  EKG;   NSR at 64.  No ST or T wave changes.     Medications:    Infusions:    Scheduled Medications: . amLODipine  5 mg Oral Daily  . aspirin EC  81 mg Oral Daily  . isosorbide mononitrate  60 mg Oral Daily  . metoprolol succinate  50 mg Oral Daily    Assessment/ Plan:    1. CAD:    The patient has a hx of CAD and stenting.  She has had CP in addition to her symptoms of fever, dyspnea, wheezing, fatigue. Her right heart cath showed normal right sided pressure. Left cath showed no significant CAD to explain her symptoms   She is ready to go home from our standpoint. Will touch base with Dr. Reynaldo Minium to make sure  that he agrees with Dc.    2. Unstable angina - troponins are negative so far  3. Pulmonary fibrosis- had normal right heart pressures   4. Essential HTN - continue current meds.   5. Viral URI.  She likely also has a viral URI,  Dr. Reynaldo Minium will likely give her a steroid taper.  Please send her home with a 12 day prednisone dosepack  Follow up with Dr. Reynaldo Minium and with pulmonary .   Follow up with Dr. Martinique in several months.   Disposition:   DC today.   Length of Stay: 2  Thayer Headings, Brooke Bonito., MD, [facility] 08/03/2014, 11:28 AM Office [phone] Pager [phone]

## 2014-08-08 ENCOUNTER — Telehealth: Payer: Self-pay | Admitting: Cardiology

## 2014-08-08 DIAGNOSIS — J84112 Idiopathic pulmonary fibrosis: Secondary | ICD-10-CM | POA: Diagnosis not present

## 2014-08-08 DIAGNOSIS — R140 Abdominal distension (gaseous): Secondary | ICD-10-CM | POA: Diagnosis not present

## 2014-08-08 DIAGNOSIS — Z6832 Body mass index (BMI) 32.0-32.9, adult: Secondary | ICD-10-CM | POA: Diagnosis not present

## 2014-08-08 DIAGNOSIS — I2510 Atherosclerotic heart disease of native coronary artery without angina pectoris: Secondary | ICD-10-CM | POA: Diagnosis not present

## 2014-08-08 NOTE — Telephone Encounter (Signed)
I would stop steroids until she has follow up with pulmonary or Dr. Reynaldo Minium.  Peter Martinique MD, [facility]

## 2014-08-08 NOTE — Telephone Encounter (Signed)
Pt need to talk to you about her Prednisone,it is making her sick

## 2014-08-08 NOTE — Telephone Encounter (Signed)
Pt had 3-4 weeks of URI issues w/ poor tolerance to AB's. Seen by PCP and pulmonology as well as cardiology.  Dr. Martinique advised direct admit to hospital on 1/26.  Pt put on Prednisone by Angelena Form PA at discharge from hospital on 1/28 for URI symptoms.  She has had problems w/ prednisone in the past and states she had requested medrol dosepak.   She has had significant GI upset (abd pain, diarrhea, esophageal discomfort) since initiating the prednisone.  She called PCP, they referred back to Dr. Martinique.  Can we advise on med change/other recommendations?

## 2014-08-08 NOTE — Telephone Encounter (Signed)
Relayed Dr. Doug Sou advice to patient, she voiced understanding.

## 2014-08-14 DIAGNOSIS — R7989 Other specified abnormal findings of blood chemistry: Secondary | ICD-10-CM | POA: Diagnosis not present

## 2014-08-14 DIAGNOSIS — E669 Obesity, unspecified: Secondary | ICD-10-CM | POA: Diagnosis not present

## 2014-08-14 DIAGNOSIS — E039 Hypothyroidism, unspecified: Secondary | ICD-10-CM | POA: Diagnosis not present

## 2014-08-14 DIAGNOSIS — I2510 Atherosclerotic heart disease of native coronary artery without angina pectoris: Secondary | ICD-10-CM | POA: Diagnosis not present

## 2014-08-14 DIAGNOSIS — E785 Hyperlipidemia, unspecified: Secondary | ICD-10-CM | POA: Diagnosis not present

## 2014-08-14 DIAGNOSIS — K589 Irritable bowel syndrome without diarrhea: Secondary | ICD-10-CM | POA: Diagnosis not present

## 2014-08-14 DIAGNOSIS — Z1389 Encounter for screening for other disorder: Secondary | ICD-10-CM | POA: Diagnosis not present

## 2014-08-14 DIAGNOSIS — R7302 Impaired glucose tolerance (oral): Secondary | ICD-10-CM | POA: Diagnosis not present

## 2014-08-14 DIAGNOSIS — M1990 Unspecified osteoarthritis, unspecified site: Secondary | ICD-10-CM | POA: Diagnosis not present

## 2014-08-24 DIAGNOSIS — H3509 Other intraretinal microvascular abnormalities: Secondary | ICD-10-CM | POA: Diagnosis not present

## 2014-08-24 DIAGNOSIS — H35033 Hypertensive retinopathy, bilateral: Secondary | ICD-10-CM | POA: Diagnosis not present

## 2014-08-24 DIAGNOSIS — H25013 Cortical age-related cataract, bilateral: Secondary | ICD-10-CM | POA: Diagnosis not present

## 2014-08-24 DIAGNOSIS — H2513 Age-related nuclear cataract, bilateral: Secondary | ICD-10-CM | POA: Diagnosis not present

## 2014-09-25 DIAGNOSIS — E785 Hyperlipidemia, unspecified: Secondary | ICD-10-CM | POA: Diagnosis not present

## 2014-09-25 DIAGNOSIS — K589 Irritable bowel syndrome without diarrhea: Secondary | ICD-10-CM | POA: Diagnosis not present

## 2014-09-25 DIAGNOSIS — I2510 Atherosclerotic heart disease of native coronary artery without angina pectoris: Secondary | ICD-10-CM | POA: Diagnosis not present

## 2014-09-25 DIAGNOSIS — R7302 Impaired glucose tolerance (oral): Secondary | ICD-10-CM | POA: Diagnosis not present

## 2014-09-25 DIAGNOSIS — I10 Essential (primary) hypertension: Secondary | ICD-10-CM | POA: Diagnosis not present

## 2014-09-25 DIAGNOSIS — E039 Hypothyroidism, unspecified: Secondary | ICD-10-CM | POA: Diagnosis not present

## 2014-09-25 DIAGNOSIS — M1990 Unspecified osteoarthritis, unspecified site: Secondary | ICD-10-CM | POA: Diagnosis not present

## 2014-09-25 DIAGNOSIS — E669 Obesity, unspecified: Secondary | ICD-10-CM | POA: Diagnosis not present

## 2014-10-16 ENCOUNTER — Ambulatory Visit: Payer: Medicare Other | Admitting: Cardiology

## 2014-10-17 ENCOUNTER — Ambulatory Visit: Payer: Medicare Other | Admitting: Cardiology

## 2014-11-08 ENCOUNTER — Ambulatory Visit (INDEPENDENT_AMBULATORY_CARE_PROVIDER_SITE_OTHER): Payer: Medicare Other | Admitting: Cardiology

## 2014-11-08 ENCOUNTER — Encounter: Payer: Self-pay | Admitting: Cardiology

## 2014-11-08 VITALS — BP 110/60 | HR 64 | Ht 67.0 in | Wt 212.6 lb

## 2014-11-08 DIAGNOSIS — I200 Unstable angina: Secondary | ICD-10-CM

## 2014-11-08 DIAGNOSIS — E785 Hyperlipidemia, unspecified: Secondary | ICD-10-CM | POA: Diagnosis not present

## 2014-11-08 DIAGNOSIS — I10 Essential (primary) hypertension: Secondary | ICD-10-CM

## 2014-11-08 DIAGNOSIS — I2510 Atherosclerotic heart disease of native coronary artery without angina pectoris: Secondary | ICD-10-CM

## 2014-11-08 NOTE — Progress Notes (Signed)
CARDIOLOGY OFFICE NOTE  Date:  11/08/2014    Jody Ruiz Date of Birth: 1940/10/16 Medical Record #[id number]  PCP:  Geoffery Lyons, MD  Cardiologist:  [place]  Chief Complaint  Patient presents with  . Follow-up    no concerns at moment     History of Present Illness: Jody Ruiz is a 74 y.o. female who presents today for follow up CAD. She is s/p non-ST elevation myocardial infarction in October 2013. She had stenting of tandem lesions in the proximal and mid LAD with a 3.0 x 16 mm and 2.5 x 28 mm Promus stents respectively. In January 2014 she presented with some recurrent symptoms then repeat cardiac catheterization showed continued patency. In September 2014 she had a Myoview study was suggested some apical ischemia. Repeat cardiac catheterization continued to show stent patency at that time.   Was seen in December with chest pain, cough, and dyspnea. Etiology unclear. Admitted. Right and left heart cath essentially normal. Stents patent. Normal pulmonary pressures. Echo was normal. Was treated with steroids but unable to take due to stomach upset. Symptoms eventually resolved. No chest pain now. Intermittent cough but improved. Breathing is OK. Scheduled for follow up with pulmonary in June.    Past Medical History  Diagnosis Date  . Anemia   . Hx of adenomatous colonic polyps   . CAD (coronary artery disease)     a. 04/2012 NSTEMI/Cath:  pLAD 90%, mLAD 70-80% (long) (3x16 & 2.5x28 Promus DES to p/m LAD), pD1 70-80%, pRI 30% (small), CFX 20%, OM1 40%, mOM2 30%, pRCA 30%, PDA 30-40%, pPLB 70%, then mid 90%, EF 65%;  b. 03/2013 Abnl CL w/apical isch;  c. 04/2013 Cath: LM nl, LAD patent stents, LCX <20, RCA 30p PDA 40-50 EF 55-60% ->Cont Med Rx.  c. CP s/p LHC with stable dz and patent stents--> Rx medically   . Hypertension   . Microscopic colitis   . Arthritis   . Obesity   . HLD (hyperlipidemia)     intolerant to statins  . Dyspnea     a. CP and SOB  06/2012 => Ticagrelor changed to Plavix  . Hx of echocardiogram     a. Echo 2/14:  mild LVH, EF 55-65%, Gr 1 diast dysfn, mild LAE  . OSA (obstructive sleep apnea)     a. on CPAP  . Internal hemorrhoids   . Idiopathic pulmonary fibrosis   . Lung nodules     Past Surgical History  Procedure Laterality Date  . Appendectomy    . Cholecystectomy    . Ventral hernia repair    . Breast biopsy      bilateral  . Knee arthroscopy      bilateral  . Vaginal polypectomy    . Shoulder arthroscopy  2012    left  . Cardiac catheterization      x4  . Coronary stent placement  04/2012    x2  . Left heart catheterization with coronary angiogram N/A 04/29/2012    Procedure: LEFT HEART CATHETERIZATION WITH CORONARY ANGIOGRAM;  Surgeon: Braxley Balandran M Martinique, MD;  Location: [facility] CATH LAB;  Service: Cardiovascular;  Laterality: N/A;  . Percutaneous coronary stent intervention (pci-s) N/A 04/29/2012    Procedure: PERCUTANEOUS CORONARY STENT INTERVENTION (PCI-S);  Surgeon: Layonna Dobie M [place], MD;  Location: [facility] CATH LAB;  Service: Cardiovascular;  Laterality: N/A;  . Left heart catheterization with coronary angiogram N/A 08/03/2012    Procedure: LEFT HEART CATHETERIZATION WITH CORONARY ANGIOGRAM;  Surgeon:  Burnell Blanks, MD;  Location: [facility] CATH LAB;  Service: Cardiovascular;  Laterality: N/A;  . Left heart catheterization with coronary angiogram N/A 04/12/2013    Procedure: LEFT HEART CATHETERIZATION WITH CORONARY ANGIOGRAM;  Surgeon: Zakariyah Freimark M [place], MD;  Location: [facility] CATH LAB;  Service: Cardiovascular;  Laterality: N/A;  . Left and right heart catheterization with coronary angiogram N/A 08/02/2014    Procedure: LEFT AND RIGHT HEART CATHETERIZATION WITH CORONARY ANGIOGRAM;  Surgeon: Burnell Blanks, MD;  Location: [facility] CATH LAB;  Service: Cardiovascular;  Laterality: N/A;     Medications: Current Outpatient Prescriptions  Medication Sig Dispense Refill  . amLODipine (NORVASC) 5 MG tablet TAKE 1 TABLET  ONCE A DAY 30 tablet 10  . aspirin EC 81 MG tablet Take 81 mg by mouth daily.    . isosorbide mononitrate (IMDUR) 60 MG 24 hr tablet TAKE ONE TABLET AT BEDTIME 30 tablet 6  . metaxalone (SKELAXIN) 800 MG tablet Take 1 tablet by mouth daily as needed for muscle spasms.     . metoprolol succinate (TOPROL XL) 50 MG 24 hr tablet Take 1 tablet (50 mg total) by mouth daily. Take with or immediately following a meal.    . nitroGLYCERIN (NITROSTAT) 0.4 MG SL tablet Place 1 tablet (0.4 mg total) under the tongue every 5 (five) minutes x 3 doses as needed for chest pain. 25 tablet 12  . polyvinyl alcohol (LIQUIFILM TEARS) 1.4 % ophthalmic solution Place 1 drop into both eyes as needed. For dry eyes     No current facility-administered medications for this visit.    Allergies: Allergies  Allergen Reactions  . Brilinta [Ticagrelor] Shortness Of Breath and Cough    fatigue  . Lisinopril Shortness Of Breath and Cough  . Statins     Muscle Weakness  . Zetia [Ezetimibe]     Myalgias   . Macrodantin [Nitrofurantoin Macrocrystal] Rash  . Sulfa Antibiotics Rash    Social History: The patient  reports that she has never smoked. She has never used smokeless tobacco. She reports that she does not drink alcohol or use illicit drugs.   Family History: The patient's family history includes Breast cancer in her sister; Colon cancer in her sister; Colon polyps in her father; Heart disease (age of onset: 54) in her brother; Heart disease (age of onset: 54) in her mother; Heart disease (age of onset: 92) in her father; Kidney disease in her father; Prostate cancer in her father.   Review of Systems: Please see the history of present illness.    All other systems are reviewed and negative.   Physical Exam: VS:  BP 110/60 mmHg  Pulse 64  Ht 5\' 7"  (1.702 m)  Wt 212 lb 9.6 oz (96.435 kg)  BMI 33.29 kg/m2 .  BMI Body mass index is 33.29 kg/(m^2). Weight is down 10 pounds over the last 6 weeks. General:  Pleasant. Well developed, well nourished and in no acute distress. HEENT: Normal. Neck: Supple, no JVD, carotid bruits, or masses noted.  Cardiac: Regular rate and rhythm. No murmurs, rubs, or gallops. No edema.  Respiratory:  Lungs clear GI: Soft and nontender.  MS: No deformity or atrophy. Gait and ROM intact. Skin: Warm and dry. Color sallow.   Neuro:  Strength and sensation are intact and no gross focal deficits noted.  Psych: Alert, appropriate and with normal affect. Looks fatigued.    Wt Readings from Last 3 Encounters:  11/08/14 212 lb 9.6 oz (96.435 kg)  08/02/14 216 lb  0.8 oz (98 kg)  08/01/14 213 lb 6.4 oz (96.798 kg)    LABORATORY DATA:    Lab Results  Component Value Date   WBC 10.5 08/03/2014   HGB 12.2 08/03/2014   HCT 36.2 08/03/2014   PLT 350 08/03/2014   GLUCOSE 136* 08/03/2014   CHOL 165 04/29/2012   TRIG 132 04/29/2012   HDL 43 04/29/2012   LDLCALC 96 04/29/2012   ALT 19 08/03/2014   AST 22 08/03/2014   NA 137 08/03/2014   K 3.7 08/03/2014   CL 104 08/03/2014   CREATININE 0.68 08/03/2014   BUN 10 08/03/2014   CO2 27 08/03/2014   TSH 0.131* 08/01/2014   INR 1.00 08/03/2014    BNP (last 3 results) No results for input(s): PROBNP in the last 8760 hours.  Other Studies Reviewed Today: Cardiac Catheterization Operative Report  Jody Ruiz [id number] 1/27/20162:39 PM ARONSON,RICHARD A, MD  Procedure Performed:  1. Left Heart Catheterization 2. Selective Coronary Angiography 3. Right Heart Catheterization 4. Left ventricular angiogram  Operator: Lauree Chandler, MD  Indication: 74 yo female with history of CAD s/p prior stenting of the LAD, HTN, HLD, IPF admitted with chest pain. Negative cardiac markers.   Procedure Details: The risks, benefits, complications, treatment options, and expected outcomes were discussed with the patient. The patient and/or family concurred with the proposed plan,  giving informed consent. The patient was brought to the cath lab after IV hydration was begun and oral premedication was given. The patient was further sedated with Versed and Fentanyl. The right antecubital area was prepped and draped a sterile fashion. A wire was used to change the antecubital IV for a 5 [place] sheath. A balloon tipped catheter was used to perform a right heart catheterization. The right wrist was prepped and draped in a sterile fashion. Modified Allens test was positive. Using the modified Seldinger access technique, a 5 French sheath was placed in the right radial artery. 3 mg Verapamil was given. 5000 Units IV heparin was given. Standard diagnostic catheters were used to perform selective coronary angiography. A pigtail catheter was used to perform a left ventricular angiogram. There were no immediate complications. The patient was taken to the recovery area in stable condition.   Hemodynamic Findings: Ao: 156/65  LV: 171/0/13 RA: 4  RV: 29/1/5 PA: 29/10 (mean 17)  PCWP: 12 Fick Cardiac Output: 8.74 L/min Fick Cardiac Index: 4.1 L/min/m2 Central Aortic Saturation: 92% Pulmonary Artery Saturation: 72%  Angiographic Findings:  Left main: No obstructive disease noted.   Left Anterior Descending Artery: Large caliber vessel that courses to the apex. Patent proximal and mid stents with no restenosis. There is diffuse 60% stenosis in the very small caliber LAD as it wraps the apex. There are several very small caliber diagonal branches with mild plaque disease. No change since last cath.   Circumflex Artery: The left circumflex coronary gives rise to 2 marginal branches. The circumflex has mild nonobstructive disease up to 20%. The first marginal branch is a small caliber vessel with 40% focal stenosis at the ostium. The second marginal branch has 20% stenosis in the mid vessel.  Right Coronary Artery: Large dominant vessel. There is a 30% stenosis  in the proximal vessel. There is mild calcific plaque in the mid vessel. The PDA is a small caliber branch with 30% proximal stenosis. The posterolateral branch is a small caliber vessel with serial 50-60% stenoses in the mid segment. This does not appear to be flow limiting.  Left Ventricular Angiogram: LVEF=60-65%.   Impression: 1. Double vessel CAD with patent stents LAD 2. Moderate stenosis in small caliber posterolateral branch that does not appear to be flow limiting.  3. Normal LV systolic function 4. Non-cardiac chest pain 5. Normal filling pressures  Recommendations: Continue medical management of CAD. I do not think I can explain her chest pain based on the degree of CAD.    Complications: None; patient tolerated the procedure well.   Echo: 08/03/14: Study Conclusions  - Left ventricle: The cavity size was normal. Wall thickness was increased in a pattern of mild LVH. Systolic function was normal. The estimated ejection fraction was in the range of 55% to 60%. Wall motion was normal; there were no regional wall motion abnormalities. Doppler parameters are consistent with abnormal left ventricular relaxation (grade 1 diastolic dysfunction). The E/e&' ratio is <8, suggesting normal LV filling pressure. - Aortic valve: Structurally normal valve. Trileaflet. There was trivial regurgitation. - Mitral valve: Calcified annulus. There was no significant regurgitation.  Impressions:  - Compared to the prior echo in 08/2012, there has been no significant change.   Assessment/Plan:  1. Coronary disease. Status post non-ST elevation myocardial infarction October 2013. Status post stenting of tandem lesions in the proximal and mid LAD. Repeat cardiac catheterization in January 2016 showed continued stent patency. Continue medical therapy.  2. Dyslipidemia. She is intolerant to statins. On Zetia.  3. Idiopathic pulmonary fibrosis - followed by  pulmonary   4. Obstructive sleep apnea.  5. Normal right heart cath and Echo.    Current medicines are reviewed with the patient today.  The patient does not have concerns regarding medicines.  The following changes have been made:  See above  Labs/ tests ordered today include:    No orders of the defined types were placed in this encounter.     Disposition:   Further disposition follow up in 6 months.

## 2014-11-08 NOTE — Patient Instructions (Signed)
Continue your current therapy  I will see you in 6 months.   

## 2014-11-21 ENCOUNTER — Other Ambulatory Visit: Payer: Self-pay | Admitting: *Deleted

## 2014-11-21 ENCOUNTER — Other Ambulatory Visit: Payer: Self-pay | Admitting: Cardiology

## 2014-11-21 MED ORDER — ISOSORBIDE MONONITRATE ER 60 MG PO TB24: 60.0000 mg | ORAL_TABLET | Freq: Every day | ORAL | Status: DC

## 2014-12-13 ENCOUNTER — Ambulatory Visit (INDEPENDENT_AMBULATORY_CARE_PROVIDER_SITE_OTHER): Payer: Medicare Other | Admitting: Adult Health

## 2014-12-13 ENCOUNTER — Encounter: Payer: Self-pay | Admitting: Adult Health

## 2014-12-13 VITALS — BP 104/58 | HR 67 | Temp 98.4°F | Ht 67.0 in | Wt 214.0 lb

## 2014-12-13 DIAGNOSIS — I200 Unstable angina: Secondary | ICD-10-CM | POA: Diagnosis not present

## 2014-12-13 DIAGNOSIS — J849 Interstitial pulmonary disease, unspecified: Secondary | ICD-10-CM | POA: Diagnosis not present

## 2014-12-13 DIAGNOSIS — G4733 Obstructive sleep apnea (adult) (pediatric): Secondary | ICD-10-CM | POA: Diagnosis not present

## 2014-12-13 NOTE — Patient Instructions (Addendum)
Restart CPAP At bedtime   Order sent to [facility] for mask and supplies  Delsym 2 tsp Twice daily  As needed  Cough .  Follow up Dr. Elsworth Soho  In 6 months and As needed

## 2014-12-18 NOTE — Progress Notes (Signed)
Subjective:    Patient ID: Jody Ruiz, female    DOB: Sep 16, 1940, 74 y.o.   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  Download 11/2012 on 12 cm showed residual 9/h, good compliance, small leak   For bloating ,changed to auto--> Download on auto 5-11 02/04/13 >> residual 7/h  Good usage, min leak , bloating improved   Dec 2014 -TLC 5.03 -stable FVC 3.3--> 3.07 (06/2013) DLCO 15.7 --> 18.6 - 68% , improved  CT chest 07/2013 >Scattered pulmonary nodules subcentimeter are unchanged. 06/2014 CT - unchanged  PFTs -stable  ANA 1:320, ds-DNA 1 -referred to [facility]  07/17/2014  Chief Complaint  Patient presents with  . Acute Visit    cough, fever, sob, fatigue, headache, off balance, dizzy, incontinence, weak, coughing up dark, thick yellow mucus; loss of appetite   Started with fever, cough, headache  -now yellow sputum PCP Reynaldo Minium called in ceftin Feels weak, tired, low grade T 100 last night, afebrile currently  Unable to use CPAP Daughter hosp with acute PE >>>levaquin rx    12/13/14 Follow up : IPF/OSA  Pt returns for follow up .  Doing okay except has dry cough .  Last CXR 07/2014 with bibasilar scarring.  CT chest 06/2014 showed stable nodules since 08/2012.  Fibrotic changes in lung bases -stable  Breathing at baseline . Gets winded at times.  Had right and left heart  cath in Jan showed patent stents, nml LV systolic fxn. nml PAP   Seen by rheumatology . No records available. -requested  Had +ANA .   Denies chest pain, orthopnea , edema or fever.  No discolored mucus .   Has severe OSA. Has been off CPAP . Wants to restart . Encouraged on compliance.   Needs new mask and supplies .  Needs order sent to DME .  Has daytime sleepiness.      Review of Systems neg for any significant sore throat, dysphagia, itching, sneezing, nasal congestion or excess/ purulent secretions, fever, chills, sweats, unintended wt loss, pleuritic or exertional cp, hempoptysis, orthopnea pnd or change in chronic leg swelling. Also denies presyncope, palpitations, heartburn, abdominal pain, nausea, vomiting, diarrhea or change in bowel or urinary habits, dysuria,hematuria, rash, arthralgias, visual complaints, headache, numbness weakness or ataxia.     Objective:   Physical Exam  Gen. Pleasant, obese, in no distress, normal affect ENT - no lesions, no post nasal drip, class 2-3 airway Neck: No JVD, no thyromegaly, no carotid bruits Lungs: no use of accessory muscles, no dullness to percussion, bibasal rales , norhonchi  Cardiovascular: Rhythm regular, heart sounds  normal, no murmurs or gallops, no peripheral edema Abdomen: soft and non-tender, no hepatosplenomegaly, BS normal. Musculoskeletal: No deformities, no cyanosis or clubbing Neuro:  alert, non focal, no tremors  Assessment & Plan:

## 2014-12-18 NOTE — Assessment & Plan Note (Signed)
Clinically stable .  Request records from rheumatology.  Cont current regimen  Delsym As needed  Cough

## 2014-12-18 NOTE — Assessment & Plan Note (Signed)
Restart CPAP At bedtime   Order sent to [facility] for mask and supplies   follow up in 6 months

## 2014-12-19 ENCOUNTER — Telehealth: Payer: Self-pay | Admitting: *Deleted

## 2014-12-19 NOTE — Telephone Encounter (Signed)
-----   Message from [name], NP sent at 12/18/2014 11:30 PM EDT ----- Can we get records from rheumatology for this lady.   Jody Ruiz

## 2014-12-19 NOTE — Addendum Note (Signed)
Addended by: Parke Poisson E on: 12/19/2014 11:47 AM   Modules accepted: Orders

## 2014-12-19 NOTE — Telephone Encounter (Signed)
LMOM for Jody Ruiz in medical records at Dr Melissa Noon office Referral was made on 12.15.15 with upcoming consult documented to be on 1.18.16  Asked Jody Ruiz to fax all ov notes by Dr Amil Amen and labs to the triage fax machine to my attention Will hold in my box to await fax

## 2014-12-22 NOTE — Telephone Encounter (Signed)
Only labs received from Dr [facility] office Called Dr Melissa Noon office again at [phone] to request ov notes again [facility] T[facility] x1 for [facility]

## 2014-12-22 NOTE — Telephone Encounter (Signed)
Office notes from Dr Amil Amen received and placed in TP's lookat with labs Nothing further needed; will sign off.

## 2014-12-29 NOTE — Telephone Encounter (Signed)
TP reviewed labs and ov notes from Dr Amil Amen TP requests to have RA review the records Will forward message to RA to make him aware - records placed in RA's lookat

## 2015-01-03 ENCOUNTER — Telehealth: Payer: Self-pay | Admitting: Pulmonary Disease

## 2015-01-03 NOTE — Telephone Encounter (Signed)
Rheum consult Amil Amen ) 07/2014 for pos ANA >>SSA +, SSB neg, p-ANCA +, MPO +, but no clinical evidence of arhtritis or vasculitis

## 2015-03-05 DIAGNOSIS — Z01419 Encounter for gynecological examination (general) (routine) without abnormal findings: Secondary | ICD-10-CM | POA: Diagnosis not present

## 2015-03-05 DIAGNOSIS — Z6835 Body mass index (BMI) 35.0-35.9, adult: Secondary | ICD-10-CM | POA: Diagnosis not present

## 2015-03-28 DIAGNOSIS — E039 Hypothyroidism, unspecified: Secondary | ICD-10-CM | POA: Diagnosis not present

## 2015-03-28 DIAGNOSIS — E785 Hyperlipidemia, unspecified: Secondary | ICD-10-CM | POA: Diagnosis not present

## 2015-03-28 DIAGNOSIS — I10 Essential (primary) hypertension: Secondary | ICD-10-CM | POA: Diagnosis not present

## 2015-03-28 DIAGNOSIS — R8290 Unspecified abnormal findings in urine: Secondary | ICD-10-CM | POA: Diagnosis not present

## 2015-03-28 DIAGNOSIS — R7302 Impaired glucose tolerance (oral): Secondary | ICD-10-CM | POA: Diagnosis not present

## 2015-03-28 DIAGNOSIS — N390 Urinary tract infection, site not specified: Secondary | ICD-10-CM | POA: Diagnosis not present

## 2015-04-04 DIAGNOSIS — K589 Irritable bowel syndrome without diarrhea: Secondary | ICD-10-CM | POA: Diagnosis not present

## 2015-04-04 DIAGNOSIS — R7302 Impaired glucose tolerance (oral): Secondary | ICD-10-CM | POA: Diagnosis not present

## 2015-04-04 DIAGNOSIS — Z0000 Encounter for general adult medical examination without abnormal findings: Secondary | ICD-10-CM | POA: Diagnosis not present

## 2015-04-04 DIAGNOSIS — E039 Hypothyroidism, unspecified: Secondary | ICD-10-CM | POA: Diagnosis not present

## 2015-04-04 DIAGNOSIS — I10 Essential (primary) hypertension: Secondary | ICD-10-CM | POA: Diagnosis not present

## 2015-04-04 DIAGNOSIS — M1990 Unspecified osteoarthritis, unspecified site: Secondary | ICD-10-CM | POA: Diagnosis not present

## 2015-04-04 DIAGNOSIS — Z23 Encounter for immunization: Secondary | ICD-10-CM | POA: Diagnosis not present

## 2015-04-04 DIAGNOSIS — I2510 Atherosclerotic heart disease of native coronary artery without angina pectoris: Secondary | ICD-10-CM | POA: Diagnosis not present

## 2015-04-04 DIAGNOSIS — E785 Hyperlipidemia, unspecified: Secondary | ICD-10-CM | POA: Diagnosis not present

## 2015-04-05 ENCOUNTER — Telehealth: Payer: Self-pay | Admitting: Internal Medicine

## 2015-04-05 ENCOUNTER — Encounter: Payer: Self-pay | Admitting: Internal Medicine

## 2015-04-05 NOTE — Telephone Encounter (Signed)
Colonoscopy scheduled.

## 2015-04-05 NOTE — Telephone Encounter (Signed)
Per OV note with Dr. Henrene Pastor he states:  Surveillance colonoscopy will be due around March 2016  Ok to schedule colon.

## 2015-04-17 ENCOUNTER — Other Ambulatory Visit: Payer: Self-pay | Admitting: Cardiology

## 2015-04-17 DIAGNOSIS — Z1212 Encounter for screening for malignant neoplasm of rectum: Secondary | ICD-10-CM | POA: Diagnosis not present

## 2015-04-17 NOTE — Telephone Encounter (Signed)
REFILL 

## 2015-04-19 DIAGNOSIS — Z1212 Encounter for screening for malignant neoplasm of rectum: Secondary | ICD-10-CM | POA: Diagnosis not present

## 2015-05-16 ENCOUNTER — Telehealth: Payer: Self-pay | Admitting: Pulmonary Disease

## 2015-05-16 ENCOUNTER — Other Ambulatory Visit: Payer: Self-pay | Admitting: Cardiology

## 2015-05-16 NOTE — Telephone Encounter (Signed)
°*  STAT* If patient is at the pharmacy, call can be transferred to refill team.   1. Which medications need to be refilled? (please list name of each medication and dose if known) Amlodipine 5mg    2. Which pharmacy/location (including street and city if local pharmacy) is medication to be sent to? [place]   3. Do they need a 30 day or 90 day supply? [place]

## 2015-05-16 NOTE — Telephone Encounter (Signed)
ATC pt fast busy signal. [facility]

## 2015-05-17 NOTE — Telephone Encounter (Signed)
Lm with husband for pt to return call when she returns home

## 2015-05-17 NOTE — Telephone Encounter (Signed)
Pt is requesting a sooner appt than Feb 2017 Pt states that Dec will be 1 year since last f/u Would like to be seen either in [place] or [place] Please advise Dr Elsworth Soho of where we can work in patient if possible. Patient refuses to see TP. Thanks.

## 2015-05-18 NOTE — Telephone Encounter (Signed)
Okay to work her in-please put on add-on list for November

## 2015-05-22 NOTE — Telephone Encounter (Signed)
Will forward to [place] to add to November list.

## 2015-05-23 ENCOUNTER — Ambulatory Visit (INDEPENDENT_AMBULATORY_CARE_PROVIDER_SITE_OTHER)
Admission: RE | Admit: 2015-05-23 | Discharge: 2015-05-23 | Disposition: A | Discharge status: Home / Self Care | Payer: Medicare Other | Source: Ambulatory Visit | Attending: Pulmonary Disease | Admitting: Pulmonary Disease

## 2015-05-23 ENCOUNTER — Ambulatory Visit (AMBULATORY_SURGERY_CENTER): Payer: Self-pay | Admitting: *Deleted

## 2015-05-23 ENCOUNTER — Ambulatory Visit (INDEPENDENT_AMBULATORY_CARE_PROVIDER_SITE_OTHER): Payer: Medicare Other | Admitting: Pulmonary Disease

## 2015-05-23 ENCOUNTER — Encounter: Payer: Self-pay | Admitting: Pulmonary Disease

## 2015-05-23 VITALS — BP 118/62 | HR 74 | Ht 68.0 in | Wt 215.4 lb

## 2015-05-23 VITALS — Ht 68.0 in | Wt 214.6 lb

## 2015-05-23 DIAGNOSIS — J84112 Idiopathic pulmonary fibrosis: Secondary | ICD-10-CM | POA: Diagnosis not present

## 2015-05-23 DIAGNOSIS — J8410 Pulmonary fibrosis, unspecified: Secondary | ICD-10-CM | POA: Diagnosis not present

## 2015-05-23 DIAGNOSIS — Z86010 Personal history of colonic polyps: Secondary | ICD-10-CM

## 2015-05-23 DIAGNOSIS — G4733 Obstructive sleep apnea (adult) (pediatric): Secondary | ICD-10-CM

## 2015-05-23 DIAGNOSIS — I200 Unstable angina: Secondary | ICD-10-CM

## 2015-05-23 MED ORDER — NA SULFATE-K SULFATE-MG SULF 17.5-3.13-1.6 GM/177ML PO SOLN: ORAL | Status: DC

## 2015-05-23 NOTE — Progress Notes (Signed)
No allergies to eggs or soy. No problems with anesthesia.  Pt not given Emmi instructions for colonoscopy; no computer access  No oxygen use  No diet drug use  

## 2015-05-23 NOTE — Patient Instructions (Signed)
Fibrosis appears stable Schedule pFTs CXR today

## 2015-05-23 NOTE — Progress Notes (Signed)
   Subjective:    Patient ID: Jody Ruiz, female    DOB: 09/15/1940, 74 y.o.   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 Acute Visit    Fatigued, discuss labs from 07/10/14 (scanned into chart); having trouble with CPAP machine, discuss.   08/2014 treated for pneumonia with Levaquin  Had right and left heart cath in Jan showed patent stents, nml LV systolic fxn. nml PAP   Seen by rheumatology- Dr. Amil Amen 08/2014 felt that she does not have significant rheumatology disease, ANA was felt to be false-positive, SSA was weakly positive, but SSB negative hence not Sjogren's  Denies chest pain, orthopnea , edema or fever.  No discolored mucus .   Has severe OSA. Back on CPAP now with sporadic compliance.     Significant tests/ events  Elevated rt hemi-D due to eventration, Neg sniff test , Neg PE on CT  Nml EF, grade 1 diastolic on echo  nml bnp  CT chest showed Subpleural fibrosis at the lung bases & sub -sm nodles Rt fissure, largest 45mm   PSG showed AHI 66/h with predominant hypopneas & nadir desatn of 79% corrected by CPAP 11 cm, with med full face mask.  Download 11/2012 on 12 cm showed residual 9/h, good compliance, small leak   For bloating ,changed to auto--> Download on auto 5-11 02/04/13 >> residual 7/h  Good usage, min leak , bloating improved   Dec 2014 -TLC 5.03 -stable FVC 3.3--> 3.07 (06/2013) DLCO 15.7 --> 18.6 - 68% , improved  CT chest 07/2013 >Scattered pulmonary nodules subcentimeter are unchanged. 06/2014 CT - unchanged  PFTs -stable  ANA 1:320, ds-DNA 1 -referred to [facility]   Review of Systems neg for any significant sore throat, dysphagia, itching,  sneezing, nasal congestion or excess/ purulent secretions, fever, chills, sweats, unintended wt loss, pleuritic or exertional cp, hempoptysis, orthopnea pnd or change in chronic leg swelling. Also denies presyncope, palpitations, heartburn, abdominal pain, nausea, vomiting, diarrhea or change in bowel or urinary habits, dysuria,hematuria, rash, arthralgias, visual complaints, headache, numbness weakness or ataxia.     Objective:   Physical Exam  Gen. Pleasant, well-nourished, in no distress ENT - no lesions, no post nasal drip Neck: No JVD, no thyromegaly, no carotid bruits Lungs: no use of accessory muscles, no dullness to percussion, bibasal rales, no rhonchi  Cardiovascular: Rhythm regular, heart sounds  normal, no murmurs or gallops, no peripheral edema Musculoskeletal: No deformities, no cyanosis or clubbing        Assessment & Plan:

## 2015-05-23 NOTE — Telephone Encounter (Signed)
Patient scheduled to see Dr. Elsworth Soho, 11/16 at 1:45pm Nothing further needed. Closing encounter

## 2015-05-24 NOTE — Assessment & Plan Note (Signed)
We discussed that the fibrotic changes could still represent idiopathic pulmonary fibrosis-but complete lack of progression and localization to the bases would be atypical. Since her lung function appears stable, do not feel the need to start antifungal products at this time ANA appears to be false-positive or rheumatology-no evidence of active arthritis

## 2015-05-24 NOTE — Progress Notes (Signed)
Quick Note:  Contacted pt with results per RA Pt expressed understanding, nothing further needed ______

## 2015-05-24 NOTE — Assessment & Plan Note (Signed)
Weight loss encouraged, compliance with goal of at least 4-6 hrs every night is the expectation. Advised against medications with sedative side effects Cautioned against driving when sleepy - understanding that sleepiness will vary on a day to day basis  

## 2015-06-06 ENCOUNTER — Ambulatory Visit (AMBULATORY_SURGERY_CENTER): Payer: Medicare Other | Admitting: Internal Medicine

## 2015-06-06 ENCOUNTER — Encounter: Payer: Self-pay | Admitting: Internal Medicine

## 2015-06-06 VITALS — BP 120/65 | HR 63 | Temp 98.2°F | Resp 10 | Ht 68.0 in | Wt 215.0 lb

## 2015-06-06 DIAGNOSIS — D125 Benign neoplasm of sigmoid colon: Secondary | ICD-10-CM

## 2015-06-06 DIAGNOSIS — Z86010 Personal history of colonic polyps: Secondary | ICD-10-CM | POA: Diagnosis not present

## 2015-06-06 DIAGNOSIS — G4733 Obstructive sleep apnea (adult) (pediatric): Secondary | ICD-10-CM | POA: Diagnosis not present

## 2015-06-06 DIAGNOSIS — D122 Benign neoplasm of ascending colon: Secondary | ICD-10-CM | POA: Diagnosis not present

## 2015-06-06 DIAGNOSIS — Z800 Family history of malignant neoplasm of digestive organs: Secondary | ICD-10-CM | POA: Diagnosis not present

## 2015-06-06 DIAGNOSIS — D123 Benign neoplasm of transverse colon: Secondary | ICD-10-CM | POA: Diagnosis not present

## 2015-06-06 DIAGNOSIS — Z1211 Encounter for screening for malignant neoplasm of colon: Secondary | ICD-10-CM | POA: Diagnosis not present

## 2015-06-06 DIAGNOSIS — I2510 Atherosclerotic heart disease of native coronary artery without angina pectoris: Secondary | ICD-10-CM | POA: Diagnosis not present

## 2015-06-06 MED ORDER — SODIUM CHLORIDE 0.9 % IV SOLN: 500.0000 mL | INTRAVENOUS | Status: DC

## 2015-06-06 NOTE — Patient Instructions (Signed)
YOU HAD AN ENDOSCOPIC PROCEDURE TODAY AT THE [facility]:   Refer to the procedure report that was given to you for any specific questions about what was found during the examination.  If the procedure report does not answer your questions, please call your gastroenterologist to clarify.  If you requested that your care partner not be given the details of your procedure findings, then the procedure report has been included in a sealed envelope for you to review at your convenience later.  YOU SHOULD EXPECT: Some feelings of bloating in the abdomen. Passage of more gas than usual.  Walking can help get rid of the air that was put into your GI tract during the procedure and reduce the bloating. If you had a lower endoscopy (such as a colonoscopy or flexible sigmoidoscopy) you may notice spotting of blood in your stool or on the toilet paper. If you underwent a bowel prep for your procedure, you may not have a normal bowel movement for a few days.  Please Note:  You might notice some irritation and congestion in your nose or some drainage.  This is from the oxygen used during your procedure.  There is no need for concern and it should clear up in a day or so.  SYMPTOMS TO REPORT IMMEDIATELY:   Following lower endoscopy (colonoscopy or flexible sigmoidoscopy):  Excessive amounts of blood in the stool  Significant tenderness or worsening of abdominal pains  Swelling of the abdomen that is new, acute  Fever of 100F or higher   For urgent or emergent issues, a gastroenterologist can be reached at any hour by calling [phone].   DIET: Your first meal following the procedure should be a small meal and then it is ok to progress to your normal diet. Heavy or fried foods are harder to digest and may make you feel nauseous or bloated.  Likewise, meals heavy in dairy and vegetables can increase bloating.  Drink plenty of fluids but you should avoid alcoholic beverages for 24  hours.  ACTIVITY:  You should plan to take it easy for the rest of today and you should NOT DRIVE or use heavy machinery until tomorrow (because of the sedation medicines used during the test).    FOLLOW UP: Our staff will call the number listed on your records the next business day following your procedure to check on you and address any questions or concerns that you may have regarding the information given to you following your procedure. If we do not reach you, we will leave a message.  However, if you are feeling well and you are not experiencing any problems, there is no need to return our call.  We will assume that you have returned to your regular daily activities without incident.  If any biopsies were taken you will be contacted by phone or by letter within the next 1-3 weeks.  Please call us at [phone] if you have not heard about the biopsies in 3 weeks.    SIGNATURES/CONFIDENTIALITY: You and/or your care partner have signed paperwork which will be entered into your electronic medical record.  These signatures attest to the fact that that the information above on your After Visit Summary has been reviewed and is understood.  Full responsibility of the confidentiality of this discharge information lies with you and/or your care-partner.  Polyp information given.  

## 2015-06-06 NOTE — Op Note (Signed)
[place]  [address] [place], [address]   COLONOSCOPY PROCEDURE REPORT [name], Jody Ruiz  MR#: [id number] BIRTHDATE: 1941-02-15 , 18  yrs. old GENDER: female ENDOSCOPIST: Eustace Quail, MD REFERRED [id number] Program Recall PROCEDURE DATE:  06/06/2015 PROCEDURE:   Colonoscopy, surveillance and Colonoscopy with snare polypectomy x 6 First Screening Colonoscopy - Avg.  risk and is 50 yrs.  old or older - No.  Prior Negative Screening - Now for repeat screening. N/A  History of Adenoma - Now for follow-up colonoscopy & has been > or = to 3 yrs.  Yes hx of adenoma.  Has been 3 or more years since last colonoscopy.  Polyps removed today? Yes ASA CLASS:   Class II INDICATIONS:Surveillance due to prior colonic neoplasia, PH Colon Adenoma, and FH Colon or Rectal Adenocarcinoma.. Sister with colon cancer and 1s; patient with multiple prior colonoscopies (Dr. Amedeo Plenty). 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  The digital rectal exam revealed no abnormalities of the rectum.   The LB [id number]  endoscope was introduced through the anus and advanced to the cecum, which was identified by both the appendix and ileocecal valve. No adverse events experienced.   The quality of the prep was excellent. (Suprep was used)  The instrument was then slowly withdrawn as the colon was fully examined. Estimated blood loss is zero unless otherwise noted in this procedure report.  COLON FINDINGS: Six polyps ranging between 3-22mm in size were found in the sigmoid colon, transverse colon, and ascending colon.  A polypectomy was performed with a cold snare.  The resection was complete, the polyp  tissue was completely retrieved and sent to histology.   The examination was otherwise normal.  Retroflexed views revealed no abnormalities. The time to cecum = 2.5 Withdrawal time = 11.9   The scope was withdrawn and the procedure completed. COMPLICATIONS: There were no immediate complications. ENDOSCOPIC IMPRESSION: 1.   Six polyps were found in the colon; polypectomy was performed with a cold snare 2.   The examination was otherwise normal RECOMMENDATIONS: 1. Repeat Colonoscopy in 3 years if 3 or more adenomas, otherwise 5 years.  eSigned:  Eustace Quail, MD 06/06/2015 3:18 PM cc: The Patient and Burnard Bunting, MD   PATIENT NAME:  Jody Ruiz, Jody Ruiz MR#: [id number]

## 2015-06-06 NOTE — Progress Notes (Signed)
Called to room to assist during endoscopic procedure.  Patient ID and intended procedure confirmed with present staff. Received instructions for my participation in the procedure from the performing physician.  

## 2015-06-06 NOTE — Progress Notes (Signed)
Patient awakening,vss,report to rn 

## 2015-06-07 ENCOUNTER — Telehealth: Payer: Self-pay | Admitting: *Deleted

## 2015-06-07 NOTE — Telephone Encounter (Signed)
  Follow up Call-  Call back number 06/06/2015  Post procedure Call Back phone  # [phone]  Permission to leave phone message Yes     Patient questions:  Do you have a fever, pain , or abdominal swelling? No. Pain Score  0 *  Have you tolerated food without any problems? Yes.    Have you been able to return to your normal activities? Yes.    Do you have any questions about your discharge instructions: Diet   No. Medications  No. Follow up visit  No.  Do you have questions or concerns about your Care? No.  Actions: * If pain score is 4 or above: No action needed, pain <4.

## 2015-06-12 ENCOUNTER — Encounter: Payer: Self-pay | Admitting: Internal Medicine

## 2015-06-20 ENCOUNTER — Ambulatory Visit (INDEPENDENT_AMBULATORY_CARE_PROVIDER_SITE_OTHER): Payer: Medicare Other | Admitting: Cardiology

## 2015-06-20 ENCOUNTER — Encounter: Payer: Self-pay | Admitting: Cardiology

## 2015-06-20 VITALS — BP 126/66 | HR 65 | Ht 67.0 in | Wt 213.4 lb

## 2015-06-20 DIAGNOSIS — E785 Hyperlipidemia, unspecified: Secondary | ICD-10-CM | POA: Diagnosis not present

## 2015-06-20 DIAGNOSIS — I10 Essential (primary) hypertension: Secondary | ICD-10-CM

## 2015-06-20 DIAGNOSIS — I200 Unstable angina: Secondary | ICD-10-CM

## 2015-06-20 DIAGNOSIS — I2510 Atherosclerotic heart disease of native coronary artery without angina pectoris: Secondary | ICD-10-CM | POA: Diagnosis not present

## 2015-06-20 NOTE — Patient Instructions (Signed)
Continue your current therapy  I will see you in about 6 months   

## 2015-06-20 NOTE — Progress Notes (Signed)
CARDIOLOGY OFFICE NOTE  Date:  06/20/2015    Jody Ruiz Date of Birth: 10/08/1940 Medical Record [id number]  PCP:  Geoffery Lyons, MD  Cardiologist:  [place]  Chief Complaint  Patient presents with  . Follow-up    pt states no chest pain no SOB no light headedness or dizziness  . Shortness of Breath    some     History of Present Illness: Jody Ruiz is a 74 y.o. female who presents today for follow up CAD. She is s/p non-ST elevation myocardial infarction in October 2013. She had stenting of tandem lesions in the proximal and mid LAD with a 3.0 x 16 mm and 2.5 x 28 mm Promus stents respectively. In January 2014 she presented with some recurrent symptoms then repeat cardiac catheterization showed continued patency. In September 2014 she had a Myoview study was suggested some apical ischemia. Repeat cardiac catheterization continued to show stent patency at that time.  She was admitted in December 2015 with chest pain, cough, and dyspnea.  Right and left heart cath essentially normal. Stents patent. Normal pulmonary pressures. Echo was normal. Was treated with steroids but unable to take due to stomach upset. Symptoms eventually resolved.  On follow up today she is doing well. No chest pain.  Breathing is OK. Seen by pulmonary last month (Dr. Elsworth Soho) and lung function was stable. Had colonoscopy at he end of November with benign polyps.    Past Medical History  Diagnosis Date  . Hx of adenomatous colonic polyps   . CAD (coronary artery disease)     a. 04/2012 NSTEMI/Cath:  pLAD 90%, mLAD 70-80% (long) (3x16 & 2.5x28 Promus DES to p/m LAD), pD1 70-80%, pRI 30% (small), CFX 20%, OM1 40%, mOM2 30%, pRCA 30%, PDA 30-40%, pPLB 70%, then mid 90%, EF 65%;  b. 03/2013 Abnl CL w/apical isch;  c. 04/2013 Cath: LM nl, LAD patent stents, LCX <20, RCA 30p PDA 40-50 EF 55-60% ->Cont Med Rx.  c. CP s/p LHC with stable dz and patent stents--> Rx medically   . Hypertension   .  Microscopic colitis   . Arthritis   . Obesity   . HLD (hyperlipidemia)     intolerant to statins  . Dyspnea     a. CP and SOB 06/2012 => Ticagrelor changed to Plavix  . Hx of echocardiogram     a. Echo 2/14:  mild LVH, EF 55-65%, Gr 1 diast dysfn, mild LAE  . OSA (obstructive sleep apnea)     a. on CPAP  . Internal hemorrhoids   . Idiopathic pulmonary fibrosis   . Lung nodules   . Sleep apnea     cpap  . Cataract   . Myocardial infarction ([place]) 2013  . Borderline hypothyroidism     Past Surgical History  Procedure Laterality Date  . Appendectomy  1994  . Cholecystectomy  2004  . Ventral hernia repair  2005  . Breast biopsy  1965, 1975    bilateral  . Knee arthroscopy  2000    bilateral  . Vaginal polypectomy  2004  . Shoulder arthroscopy  2012    left  . Cardiac catheterization      x4  . Coronary stent placement  04/2012    x2  . Left heart catheterization with coronary angiogram N/A 04/29/2012    Procedure: LEFT HEART CATHETERIZATION WITH CORONARY ANGIOGRAM;  Surgeon: Aanshi Batchelder M Martinique, MD;  Location: [facility] CATH LAB;  Service: Cardiovascular;  Laterality: N/A;  .  Percutaneous coronary stent intervention (pci-s) N/A 04/29/2012    Procedure: PERCUTANEOUS CORONARY STENT INTERVENTION (PCI-S);  Surgeon: Erendida Wrenn M Martinique, MD;  Location: [facility] CATH LAB;  Service: Cardiovascular;  Laterality: N/A;  . Left heart catheterization with coronary angiogram N/A 08/03/2012    Procedure: LEFT HEART CATHETERIZATION WITH CORONARY ANGIOGRAM;  Surgeon: Burnell Blanks, MD;  Location: [facility] CATH LAB;  Service: Cardiovascular;  Laterality: N/A;  . Left heart catheterization with coronary angiogram N/A 04/12/2013    Procedure: LEFT HEART CATHETERIZATION WITH CORONARY ANGIOGRAM;  Surgeon: Ixchel Duck M Martinique, MD;  Location: [facility] CATH LAB;  Service: Cardiovascular;  Laterality: N/A;  . Left and right heart catheterization with coronary angiogram N/A 08/02/2014    Procedure: LEFT AND RIGHT HEART CATHETERIZATION  WITH CORONARY ANGIOGRAM;  Surgeon: Burnell Blanks, MD;  Location: [facility] CATH LAB;  Service: Cardiovascular;  Laterality: N/A;     Medications: Current Outpatient Prescriptions  Medication Sig Dispense Refill  . amLODipine (NORVASC) 5 MG tablet Take 1 tablet (5 mg total) by mouth daily. 30 tablet 1  . aspirin EC 81 MG tablet Take 81 mg by mouth daily.    . isosorbide mononitrate (IMDUR) 60 MG 24 hr tablet Take 1 tablet (60 mg total) by mouth at bedtime. 30 tablet 6  . metaxalone (SKELAXIN) 800 MG tablet Take 1 tablet by mouth daily as needed for muscle spasms.     . metoprolol succinate (TOPROL XL) 50 MG 24 hr tablet Take 1 tablet (50 mg total) by mouth daily. Take with or immediately following a meal.    . nitroGLYCERIN (NITROSTAT) 0.4 MG SL tablet Place 1 tablet (0.4 mg total) under the tongue every 5 (five) minutes x 3 doses as needed for chest pain. 25 tablet 12  . polyvinyl alcohol (LIQUIFILM TEARS) 1.4 % ophthalmic solution Place 1 drop into both eyes as needed. For dry eyes     No current facility-administered medications for this visit.    Allergies: Allergies  Allergen Reactions  . Brilinta [Ticagrelor] Shortness Of Breath and Cough    fatigue  . Lisinopril Shortness Of Breath and Cough  . Statins     Muscle Weakness  . Zetia [Ezetimibe]     Myalgias   . Macrodantin [Nitrofurantoin Macrocrystal] Rash  . Sulfa Antibiotics Rash    Social History: The patient  reports that she has never smoked. She has never used smokeless tobacco. She reports that she does not drink alcohol or use illicit drugs.   Family History: The patient's family history includes Breast cancer in her sister; Colon cancer (age of onset: 37) in her sister; Colon polyps in her father; Heart disease (age of onset: 84) in her brother; Heart disease (age of onset: 61) in her mother; Heart disease (age of onset: 14) in her father; Kidney disease in her father; Prostate cancer in her father. There is no  history of Esophageal cancer, Rectal cancer, or Stomach cancer.   Review of Systems: Please see the history of present illness.    All other systems are reviewed and negative.   Physical Exam: VS:  BP 126/66 mmHg  Pulse 65  Ht 5\' 7"  (1.702 m)  Wt 96.798 kg (213 lb 6.4 oz)  BMI 33.42 kg/m2 .  BMI Body mass index is 33.42 kg/(m^2).  General: Pleasant. Well developed, well nourished and in no acute distress. HEENT: Normal. Neck: Supple, no JVD, carotid bruits, or masses noted.  Cardiac: Regular rate and rhythm. No murmurs, rubs, or gallops. No edema.  Respiratory:  Lungs clear GI: Soft and nontender.  MS: No deformity or atrophy. Gait and ROM intact. Skin: Warm and dry. Color sallow.   Neuro:  Strength and sensation are intact and no gross focal deficits noted.  Psych: Alert, appropriate and with normal affect. Looks fatigued.    Wt Readings from Last 3 Encounters:  06/20/15 96.798 kg (213 lb 6.4 oz)  06/06/15 97.523 kg (215 lb)  05/23/15 97.342 kg (214 lb 9.6 oz)    LABORATORY DATA:    Lab Results  Component Value Date   WBC 10.5 08/03/2014   HGB 12.2 08/03/2014   HCT 36.2 08/03/2014   PLT 350 08/03/2014   GLUCOSE 136* 08/03/2014   CHOL 165 04/29/2012   TRIG 132 04/29/2012   HDL 43 04/29/2012   LDLCALC 96 04/29/2012   ALT 19 08/03/2014   AST 22 08/03/2014   NA 137 08/03/2014   K 3.7 08/03/2014   CL 104 08/03/2014   CREATININE 0.68 08/03/2014   BUN 10 08/03/2014   CO2 27 08/03/2014   TSH 0.131* 08/01/2014   INR 1.00 08/03/2014    BNP (last 3 results) No results for input(s): PROBNP in the last 8760 hours.  Other Studies Reviewed Today: None  Assessment/Plan:  1. Coronary disease. Status post non-ST elevation myocardial infarction October 2013. Status post stenting of tandem lesions in the proximal and mid LAD. Repeat cardiac catheterization in January 2016 showed continued stent patency. Continue medical therapy.  2. Dyslipidemia. She is intolerant to  statins. On Zetia.  3. Idiopathic pulmonary fibrosis - followed by pulmonary - stable. Normal right heart pressures by cath.  4. Obstructive sleep apnea.  5. Normal right heart cath and Echo.    Current medicines are reviewed with the patient today.  The patient does not have concerns regarding medicines.  The following changes have been made:  See above  Labs/ tests ordered today include:    No orders of the defined types were placed in this encounter.     Disposition:   Further disposition follow up in 6 months.

## 2015-06-25 ENCOUNTER — Other Ambulatory Visit: Payer: Self-pay | Admitting: Cardiology

## 2015-06-26 NOTE — Telephone Encounter (Signed)
Peter M Martinique, MD at 06/20/2015 5:12 PM  isosorbide mononitrate (IMDUR) 60 MG 24 hr tablet Take 1 tablet (60 mg total) by mouth at bedtime Patient Instructions     Continue your current therapy  I will see you in about 6 months.

## 2015-06-27 ENCOUNTER — Ambulatory Visit (INDEPENDENT_AMBULATORY_CARE_PROVIDER_SITE_OTHER): Payer: Medicare Other | Admitting: Pulmonary Disease

## 2015-06-27 DIAGNOSIS — Z1231 Encounter for screening mammogram for malignant neoplasm of breast: Secondary | ICD-10-CM | POA: Diagnosis not present

## 2015-06-27 DIAGNOSIS — Z803 Family history of malignant neoplasm of breast: Secondary | ICD-10-CM | POA: Diagnosis not present

## 2015-06-27 DIAGNOSIS — J8410 Pulmonary fibrosis, unspecified: Secondary | ICD-10-CM | POA: Diagnosis not present

## 2015-06-27 LAB — PULMONARY FUNCTION TEST
DL/VA % PRED: 74 %
DL/VA: 3.73 ml/min/mmHg/L
DLCO unc % pred: 66 %
DLCO unc: 17.85 ml/min/mmHg
RV % pred: 69 %
RV: 1.63 L
TLC % pred: 88 %
TLC: 4.71 L

## 2015-06-27 NOTE — Progress Notes (Signed)
PFT performed today. 

## 2015-07-17 ENCOUNTER — Other Ambulatory Visit: Payer: Self-pay | Admitting: Cardiology

## 2015-07-17 NOTE — Telephone Encounter (Signed)
Rx request sent to pharmacy.  

## 2015-08-27 DIAGNOSIS — H25013 Cortical age-related cataract, bilateral: Secondary | ICD-10-CM | POA: Diagnosis not present

## 2015-08-27 DIAGNOSIS — H35031 Hypertensive retinopathy, right eye: Secondary | ICD-10-CM | POA: Diagnosis not present

## 2015-08-27 DIAGNOSIS — H2513 Age-related nuclear cataract, bilateral: Secondary | ICD-10-CM | POA: Diagnosis not present

## 2015-08-27 DIAGNOSIS — H524 Presbyopia: Secondary | ICD-10-CM | POA: Diagnosis not present

## 2015-08-27 DIAGNOSIS — H35032 Hypertensive retinopathy, left eye: Secondary | ICD-10-CM | POA: Diagnosis not present

## 2015-08-27 DIAGNOSIS — H35033 Hypertensive retinopathy, bilateral: Secondary | ICD-10-CM | POA: Diagnosis not present

## 2015-10-01 DIAGNOSIS — I10 Essential (primary) hypertension: Secondary | ICD-10-CM | POA: Diagnosis not present

## 2015-10-01 DIAGNOSIS — I2510 Atherosclerotic heart disease of native coronary artery without angina pectoris: Secondary | ICD-10-CM | POA: Diagnosis not present

## 2015-10-01 DIAGNOSIS — R55 Syncope and collapse: Secondary | ICD-10-CM | POA: Diagnosis not present

## 2015-10-01 DIAGNOSIS — E784 Other hyperlipidemia: Secondary | ICD-10-CM | POA: Diagnosis not present

## 2015-10-01 DIAGNOSIS — H5452 Low vision, left eye, normal vision right eye: Secondary | ICD-10-CM | POA: Diagnosis not present

## 2015-10-01 DIAGNOSIS — Z6832 Body mass index (BMI) 32.0-32.9, adult: Secondary | ICD-10-CM | POA: Diagnosis not present

## 2015-10-01 DIAGNOSIS — R51 Headache: Secondary | ICD-10-CM | POA: Diagnosis not present

## 2015-10-01 DIAGNOSIS — R5383 Other fatigue: Secondary | ICD-10-CM | POA: Diagnosis not present

## 2015-10-02 ENCOUNTER — Other Ambulatory Visit: Payer: Self-pay | Admitting: Internal Medicine

## 2015-10-02 DIAGNOSIS — R55 Syncope and collapse: Secondary | ICD-10-CM

## 2015-10-02 DIAGNOSIS — H5450 Low vision, one eye, unspecified eye: Secondary | ICD-10-CM

## 2015-10-02 DIAGNOSIS — R51 Headache: Principal | ICD-10-CM

## 2015-10-02 DIAGNOSIS — R519 Headache, unspecified: Secondary | ICD-10-CM

## 2015-10-03 ENCOUNTER — Ambulatory Visit
Admission: RE | Admit: 2015-10-03 | Discharge: 2015-10-03 | Disposition: A | Discharge status: Home / Self Care | Payer: Medicare Other | Source: Ambulatory Visit | Attending: Internal Medicine | Admitting: Internal Medicine

## 2015-10-03 DIAGNOSIS — R42 Dizziness and giddiness: Secondary | ICD-10-CM | POA: Diagnosis not present

## 2015-10-03 DIAGNOSIS — H5450 Low vision, one eye, unspecified eye: Secondary | ICD-10-CM

## 2015-10-03 DIAGNOSIS — R519 Headache, unspecified: Secondary | ICD-10-CM

## 2015-10-03 DIAGNOSIS — R55 Syncope and collapse: Secondary | ICD-10-CM

## 2015-10-03 DIAGNOSIS — R51 Headache: Principal | ICD-10-CM

## 2015-10-08 DIAGNOSIS — R51 Headache: Secondary | ICD-10-CM | POA: Diagnosis not present

## 2015-10-08 DIAGNOSIS — Z1389 Encounter for screening for other disorder: Secondary | ICD-10-CM | POA: Diagnosis not present

## 2015-10-08 DIAGNOSIS — E039 Hypothyroidism, unspecified: Secondary | ICD-10-CM | POA: Diagnosis not present

## 2015-10-08 DIAGNOSIS — R7302 Impaired glucose tolerance (oral): Secondary | ICD-10-CM | POA: Diagnosis not present

## 2015-10-08 DIAGNOSIS — K589 Irritable bowel syndrome without diarrhea: Secondary | ICD-10-CM | POA: Diagnosis not present

## 2015-10-08 DIAGNOSIS — M1990 Unspecified osteoarthritis, unspecified site: Secondary | ICD-10-CM | POA: Diagnosis not present

## 2015-10-08 DIAGNOSIS — E669 Obesity, unspecified: Secondary | ICD-10-CM | POA: Diagnosis not present

## 2015-10-08 DIAGNOSIS — R5383 Other fatigue: Secondary | ICD-10-CM | POA: Diagnosis not present

## 2015-10-08 DIAGNOSIS — J84112 Idiopathic pulmonary fibrosis: Secondary | ICD-10-CM | POA: Diagnosis not present

## 2015-11-21 ENCOUNTER — Encounter: Payer: Self-pay | Admitting: Adult Health

## 2015-11-21 ENCOUNTER — Ambulatory Visit (INDEPENDENT_AMBULATORY_CARE_PROVIDER_SITE_OTHER): Payer: Medicare Other | Admitting: Adult Health

## 2015-11-21 VITALS — BP 112/70 | HR 68 | Temp 97.7°F | Ht 68.0 in | Wt 215.0 lb

## 2015-11-21 DIAGNOSIS — J84112 Idiopathic pulmonary fibrosis: Secondary | ICD-10-CM | POA: Diagnosis not present

## 2015-11-21 DIAGNOSIS — G4733 Obstructive sleep apnea (adult) (pediatric): Secondary | ICD-10-CM

## 2015-11-21 NOTE — Assessment & Plan Note (Signed)
Appears stable with similar TLC and DLCO  Recent bronchitis appears to be resolving  Vaccines utd.   Plan  Continue on current regimen .  Delsym 2 tsp Twice daily  As needed  Cough .  Follow up Dr. Elsworth Soho  In 6 months and As needed

## 2015-11-21 NOTE — Assessment & Plan Note (Signed)
Severe OSA w/ CPAP noncompliance Compliance encouraged.

## 2015-11-21 NOTE — Patient Instructions (Signed)
Restart CPAP At bedtime  , try to get in at least 4hr each night.  Continue on current regimen .  Delsym 2 tsp Twice daily  As needed  Cough .  Follow up Dr. Elsworth Soho  In 6 months and As needed

## 2015-11-21 NOTE — Progress Notes (Signed)
Subjective:    Patient ID: Jody Ruiz, female    DOB: 1941-03-31, 75 y.o.   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 Amil Amen 08/2014 felt that she does not have significant rheumatology disease, ANA was felt to be false-positive, SSA was weakly positive, but SSB negative hence not Sjogren's   Significant tests/ events  Elevated rt hemi-D due to eventration, Neg sniff test , Neg PE on CT  Nml EF, grade 1 diastolic on echo  nml bnp  CT chest showed Subpleural fibrosis at the lung bases & sub -sm nodles Rt fissure, largest 5mm   PSG showed AHI 66/h with predominant hypopneas & nadir desatn of 79% corrected by CPAP 11 cm, with med full face mask.  Download 11/2012 on 12 cm showed residual 9/h, good compliance, small leak   For bloating ,changed to auto--> Download on auto 5-11 02/04/13 >> residual 7/h  Good usage, min leak , bloating improved   Dec 2014 -TLC 5.03 -stable FVC 3.3--> 3.07 (06/2013) DLCO 15.7 --> 18.6 - 68% , improved  CT chest 07/2013 >Scattered pulmonary nodules subcentimeter are unchanged. 06/2014 CT - unchanged  PFTs -stable  ANA 1:320, ds-DNA 1 -[facility] to [facility]  11/21/2015 Follow up : ILD and OSA  Pt returns for 6 month follow up .  Says she developed increased  SOB with exertion, occ prod cough with light yellow colored mucus. Few weeks ago ,  Was seen by PCP 3 weeks ago ,given abx for bronchitis . Marland Kitchen She is feeling better but has lingering cough. Taking delsym for cough, seems to be helping.  . Says whole family got same symptoms. Denies any sinus pressure/drainage, chest congestion/tightness, fever, nausea or vomiting.  Says she is not that active . Wears out easily . Typically does not  have much cough .  PFT 06/2015 showed DLCO 66%. TLC 88% , very similar to prev., stable.  PVX and Prevnar 13 vaccines utd.   ON CPAP for OSA. Does not wear on regular basis . When she wears it, gets in 5-7 hr .  Discussed compliance and importance of wearing machine. Pt education given .  Has a lot of stress with family illness.   Denies chest pain, orthopnea, edema , hemoptysis, fever.   Review of Systems   Constitutional:   No  weight loss, night sweats,  Fevers, chills,  +fatigue, or  lassitude.  HEENT:   No headaches,  Difficulty swallowing,  Tooth/dental problems, or  Sore throat,                No sneezing, itching, ear ache, nasal congestion, post nasal drip,   CV:  No chest pain,  Orthopnea, PND, swelling in lower extremities, anasarca, dizziness, palpitations, syncope.   GI  No heartburn, indigestion, abdominal pain, nausea, vomiting, diarrhea, change in bowel habits, loss of appetite, bloody stools.   Resp:    No chest wall deformity  Skin: no rash or lesions.  GU: no dysuria, change in color of urine, no urgency or frequency.  No flank pain, no hematuria   MS:  No joint pain or swelling.  No decreased range of motion.  No back pain.  Psych:  No  change in mood or affect. No depression or anxiety.  No memory loss.         Objective:   Physical Exam  Filed Vitals:   11/21/15 0946  BP: 112/70  Pulse: 68  Temp: 97.7 F (36.5 C)  TempSrc: Oral  Height: 5\' 8"  (1.727 m)  Weight: 215 lb (97.523 kg)  SpO2: 97%  Body mass index is 32.7 kg/(m^2).   GEN: A/Ox3; pleasant , NAD, elderly   HEENT:  [place]/AT,  EACs-clear, TMs-wnl, NOSE-clear, THROAT-clear, no lesions, no postnasal drip or exudate noted. Class 2-3 MP airway   NECK:  Supple w/ fair ROM; no JVD; normal carotid impulses w/o bruits; no thyromegaly or nodules palpated; no lymphadenopathy.  RESP  Faint BB crackles no accessory muscle use, no dullness to percussion  CARD:  RRR, no m/r/g  , no peripheral  edema, pulses intact, no cyanosis or clubbing.  GI:   Soft & nt; nml bowel sounds; no organomegaly or masses detected.  Musco: Warm bil, no deformities or joint swelling noted.   Neuro: alert, no focal deficits noted.    Skin: Warm, no lesions or rashes  Tammy Parrett NP-C  [facility]  11/21/2015       Assessment & Plan:

## 2015-11-22 NOTE — Progress Notes (Signed)
Reviewed & agree with plan  

## 2015-12-07 DIAGNOSIS — H04321 Acute dacryocystitis of right lacrimal passage: Secondary | ICD-10-CM | POA: Diagnosis not present

## 2015-12-12 ENCOUNTER — Ambulatory Visit (INDEPENDENT_AMBULATORY_CARE_PROVIDER_SITE_OTHER): Payer: Medicare Other | Admitting: Cardiology

## 2015-12-12 ENCOUNTER — Encounter: Payer: Self-pay | Admitting: Cardiology

## 2015-12-12 VITALS — BP 113/70 | HR 72 | Ht 67.0 in | Wt 217.2 lb

## 2015-12-12 DIAGNOSIS — J84112 Idiopathic pulmonary fibrosis: Secondary | ICD-10-CM

## 2015-12-12 DIAGNOSIS — H1013 Acute atopic conjunctivitis, bilateral: Secondary | ICD-10-CM | POA: Diagnosis not present

## 2015-12-12 DIAGNOSIS — H04321 Acute dacryocystitis of right lacrimal passage: Secondary | ICD-10-CM | POA: Diagnosis not present

## 2015-12-12 DIAGNOSIS — H04123 Dry eye syndrome of bilateral lacrimal glands: Secondary | ICD-10-CM | POA: Diagnosis not present

## 2015-12-12 DIAGNOSIS — I2510 Atherosclerotic heart disease of native coronary artery without angina pectoris: Secondary | ICD-10-CM

## 2015-12-12 DIAGNOSIS — I10 Essential (primary) hypertension: Secondary | ICD-10-CM | POA: Diagnosis not present

## 2015-12-12 DIAGNOSIS — H524 Presbyopia: Secondary | ICD-10-CM | POA: Diagnosis not present

## 2015-12-12 NOTE — Progress Notes (Signed)
CARDIOLOGY OFFICE NOTE  Date:  12/12/2015    Jody Ruiz Date of Birth: 1940-07-08 Medical Record [id number]  PCP:  Geoffery Lyons, MD  Cardiologist:  [place]  Chief Complaint  Patient presents with  . Follow-up    patient currently being treated for right eye infection. also with some chest discomfort.     History of Present Illness: Jody Ruiz is a 75 y.o. female is seen today for follow up CAD. She is s/p non-ST elevation myocardial infarction in October 2013. She had stenting of tandem lesions in the proximal and mid LAD with a 3.0 x 16 mm and 2.5 x 28 mm Promus stents respectively. In January 2014 she presented with some recurrent symptoms then repeat cardiac catheterization showed continued patency. In September 2014 she had a Myoview study was suggested some apical ischemia. Repeat cardiac catheterization continued to show stent patency at that time.  She was admitted in December 2015 with chest pain, cough, and dyspnea.  Right and left heart cath essentially normal. Stents patent. Normal pulmonary pressures. Echo was normal. Subsequently diagnosed with ILD.  On follow up today she is doing well. Rare episodes of chest pain more localized to left scapula.   Breathing is OK. She does have a dry hacky cough. Seen is followed by pulmonary and lung function was stable. Several months ago she had an episode where she felt she might pass out and had difficulty processing. MRI was negative. She has similar episode when she was last hospitalized and monitor at that time showed no arrhythmia.    Past Medical History  Diagnosis Date  . Hx of adenomatous colonic polyps   . CAD (coronary artery disease)     a. 04/2012 NSTEMI/Cath:  pLAD 90%, mLAD 70-80% (long) (3x16 & 2.5x28 Promus DES to p/m LAD), pD1 70-80%, pRI 30% (small), CFX 20%, OM1 40%, mOM2 30%, pRCA 30%, PDA 30-40%, pPLB 70%, then mid 90%, EF 65%;  b. 03/2013 Abnl CL w/apical isch;  c. 04/2013 Cath: LM nl, LAD  patent stents, LCX <20, RCA 30p PDA 40-50 EF 55-60% ->Cont Med Rx.  c. CP s/p LHC with stable dz and patent stents--> Rx medically   . Hypertension   . Microscopic colitis   . Arthritis   . Obesity   . HLD (hyperlipidemia)     intolerant to statins  . Dyspnea     a. CP and SOB 06/2012 => Ticagrelor changed to Plavix  . Hx of echocardiogram     a. Echo 2/14:  mild LVH, EF 55-65%, Gr 1 diast dysfn, mild LAE  . OSA (obstructive sleep apnea)     a. on CPAP  . Internal hemorrhoids   . Idiopathic pulmonary fibrosis   . Lung nodules   . Sleep apnea     cpap  . Cataract   . Myocardial infarction ([place]) 2013  . Borderline hypothyroidism     Past Surgical History  Procedure Laterality Date  . Appendectomy  1994  . Cholecystectomy  2004  . Ventral hernia repair  2005  . Breast biopsy  1965, 1975    bilateral  . Knee arthroscopy  2000    bilateral  . Vaginal polypectomy  2004  . Shoulder arthroscopy  2012    left  . Cardiac catheterization      x4  . Coronary stent placement  04/2012    x2  . Left heart catheterization with coronary angiogram N/A 04/29/2012    Procedure: LEFT HEART  CATHETERIZATION WITH CORONARY ANGIOGRAM;  Surgeon: Whittney Steenson M [place], MD;  Location: [facility] CATH LAB;  Service: Cardiovascular;  Laterality: N/A;  . Percutaneous coronary stent intervention (pci-s) N/A 04/29/2012    Procedure: PERCUTANEOUS CORONARY STENT INTERVENTION (PCI-S);  Surgeon: Jaida Basurto M [place], MD;  Location: [facility] CATH LAB;  Service: Cardiovascular;  Laterality: N/A;  . Left heart catheterization with coronary angiogram N/A 08/03/2012    Procedure: LEFT HEART CATHETERIZATION WITH CORONARY ANGIOGRAM;  Surgeon: Burnell Blanks, MD;  Location: [facility] CATH LAB;  Service: Cardiovascular;  Laterality: N/A;  . Left heart catheterization with coronary angiogram N/A 04/12/2013    Procedure: LEFT HEART CATHETERIZATION WITH CORONARY ANGIOGRAM;  Surgeon: Champayne Kocian M [place], MD;  Location: [facility] CATH LAB;  Service:  Cardiovascular;  Laterality: N/A;  . Left and right heart catheterization with coronary angiogram N/A 08/02/2014    Procedure: LEFT AND RIGHT HEART CATHETERIZATION WITH CORONARY ANGIOGRAM;  Surgeon: Burnell Blanks, MD;  Location: [facility] CATH LAB;  Service: Cardiovascular;  Laterality: N/A;     Medications: Current Outpatient Prescriptions  Medication Sig Dispense Refill  . amLODipine (NORVASC) 5 MG tablet TAKE 1 TABLET DAILY 30 tablet 6  . amoxicillin-clavulanate (AUGMENTIN) 875-125 MG tablet     . aspirin EC 81 MG tablet Take 81 mg by mouth daily.    . isosorbide mononitrate (IMDUR) 60 MG 24 hr tablet TAKE ONE TABLET AT BEDTIME 90 tablet 1  . metoprolol succinate (TOPROL XL) 50 MG 24 hr tablet Take 1 tablet (50 mg total) by mouth daily. Take with or immediately following a meal.    . neomycin-polymyxin b-dexamethasone (MAXITROL) 3.5-10000-0.1 SUSP INSTILL 1 DROP IN THE RIGHT EYE FOUR TIMES A DAY  0  . nitroGLYCERIN (NITROSTAT) 0.4 MG SL tablet Place 1 tablet (0.4 mg total) under the tongue every 5 (five) minutes x 3 doses as needed for chest pain. 25 tablet 12  . polyvinyl alcohol (LIQUIFILM TEARS) 1.4 % ophthalmic solution Place 1 drop into both eyes as needed. For dry eyes     No current facility-administered medications for this visit.    Allergies: Allergies  Allergen Reactions  . Brilinta [Ticagrelor] Shortness Of Breath and Cough    fatigue  . Lisinopril Shortness Of Breath and Cough  . Statins     Muscle Weakness  . Zetia [Ezetimibe]     Myalgias   . Macrodantin [Nitrofurantoin Macrocrystal] Rash  . Sulfa Antibiotics Rash    Social History: The patient  reports that she has never smoked. She has never used smokeless tobacco. She reports that she does not drink alcohol or use illicit drugs.   Family History: The patient's family history includes Breast cancer in her sister; Colon cancer (age of onset: 39) in her sister; Colon polyps in her father; Heart disease (age  of onset: 45) in her brother; Heart disease (age of onset: 1) in her mother; Heart disease (age of onset: 32) in her father; Kidney disease in her father; Prostate cancer in her father. There is no history of Esophageal cancer, Rectal cancer, or Stomach cancer.   Review of Systems: Please see the history of present illness.    All other systems are reviewed and negative.   Physical Exam: VS:  BP 113/70 mmHg  Pulse 72  Ht 5\' 7"  (1.702 m)  Wt 217 lb 3.2 oz (98.521 kg)  BMI 34.01 kg/m2 .  BMI Body mass index is 34.01 kg/(m^2).  General: Pleasant. Well developed, well nourished and in no acute distress. HEENT: Normal. Neck:  Supple, no JVD, carotid bruits, or masses noted.  Cardiac: Regular rate and rhythm. No murmurs, rubs, or gallops. No edema.  Respiratory:  Lungs clear GI: Soft and nontender.  MS: No deformity or atrophy. Gait and ROM intact. Skin: Warm and dry. Color sallow.   Neuro:  Strength and sensation are intact and no gross focal deficits noted.  Psych: Alert, appropriate and with normal affect. Looks fatigued.    Wt Readings from Last 3 Encounters:  12/12/15 217 lb 3.2 oz (98.521 kg)  11/21/15 215 lb (97.523 kg)  06/20/15 213 lb 6.4 oz (96.798 kg)    LABORATORY DATA:    Lab Results  Component Value Date   WBC 10.5 08/03/2014   HGB 12.2 08/03/2014   HCT 36.2 08/03/2014   PLT 350 08/03/2014   GLUCOSE 136* 08/03/2014   CHOL 165 04/29/2012   TRIG 132 04/29/2012   HDL 43 04/29/2012   LDLCALC 96 04/29/2012   ALT 19 08/03/2014   AST 22 08/03/2014   NA 137 08/03/2014   K 3.7 08/03/2014   CL 104 08/03/2014   CREATININE 0.68 08/03/2014   BUN 10 08/03/2014   CO2 27 08/03/2014   TSH 0.131* 08/01/2014   INR 1.00 08/03/2014    BNP (last 3 results) No results for input(s): PROBNP in the last 8760 hours.   Ecg today shows NSR rate 72. Nonspecific ST-T changes. I have personally reviewed and interpreted this study.  Other Studies Reviewed  Today: None  Assessment/Plan:  1. Coronary disease. Status post non-ST elevation myocardial infarction October 2013. Status post stenting of tandem lesions in the proximal and mid LAD. Repeat cardiac catheterization in January 2016 showed continued stent patency. Continue medical therapy.  2. Dyslipidemia. She is intolerant to statins. On Zetia.  3. Idiopathic pulmonary fibrosis - followed by pulmonary - stable. Normal right heart pressures by cath.  4. Obstructive sleep apnea.  5. Normal right heart cath and Echo.    Current medicines are reviewed with the patient today.  The patient does not have concerns regarding medicines.  The following changes have been made:  See above  Labs/ tests ordered today include:    Orders Placed This Encounter  Procedures  . EKG 12-Lead     Disposition:   Further disposition follow up in 6-8 months.

## 2015-12-12 NOTE — Patient Instructions (Signed)
Continue your current therapy  I will see you in 6-8 months. 

## 2015-12-24 ENCOUNTER — Other Ambulatory Visit: Payer: Self-pay | Admitting: Cardiology

## 2015-12-28 DIAGNOSIS — H04321 Acute dacryocystitis of right lacrimal passage: Secondary | ICD-10-CM | POA: Diagnosis not present

## 2016-02-14 ENCOUNTER — Other Ambulatory Visit: Payer: Self-pay | Admitting: Cardiology

## 2016-02-14 NOTE — Telephone Encounter (Signed)
Rx request sent to pharmacy.  

## 2016-02-29 ENCOUNTER — Telehealth: Payer: Self-pay

## 2016-02-29 NOTE — Telephone Encounter (Signed)
Patient came in this morning with husband to his appointment with Dr.Jordan.She stated she would like appointment.Stated for the past month she has been very tired,intermittent chest heaviness this week.No chest heaviness at present.Also complains of heaviness in both arms.Appointment scheduled with Almyra Deforest PA Mon 03/03/16 at 10:30 am.Advised to go to ER if needed.

## 2016-03-03 ENCOUNTER — Encounter: Payer: Self-pay | Admitting: Physician Assistant

## 2016-03-03 ENCOUNTER — Ambulatory Visit (INDEPENDENT_AMBULATORY_CARE_PROVIDER_SITE_OTHER): Payer: Medicare Other | Admitting: Physician Assistant

## 2016-03-03 VITALS — BP 110/68 | HR 63 | Ht 67.0 in | Wt 218.0 lb

## 2016-03-03 DIAGNOSIS — I10 Essential (primary) hypertension: Secondary | ICD-10-CM

## 2016-03-03 DIAGNOSIS — G4733 Obstructive sleep apnea (adult) (pediatric): Secondary | ICD-10-CM | POA: Diagnosis not present

## 2016-03-03 DIAGNOSIS — Z9989 Dependence on other enabling machines and devices: Secondary | ICD-10-CM

## 2016-03-03 DIAGNOSIS — I2510 Atherosclerotic heart disease of native coronary artery without angina pectoris: Secondary | ICD-10-CM

## 2016-03-03 DIAGNOSIS — R079 Chest pain, unspecified: Secondary | ICD-10-CM

## 2016-03-03 DIAGNOSIS — E039 Hypothyroidism, unspecified: Secondary | ICD-10-CM

## 2016-03-03 DIAGNOSIS — R5383 Other fatigue: Secondary | ICD-10-CM

## 2016-03-03 DIAGNOSIS — E785 Hyperlipidemia, unspecified: Secondary | ICD-10-CM

## 2016-03-03 DIAGNOSIS — J8410 Pulmonary fibrosis, unspecified: Secondary | ICD-10-CM

## 2016-03-03 DIAGNOSIS — I25119 Atherosclerotic heart disease of native coronary artery with unspecified angina pectoris: Secondary | ICD-10-CM

## 2016-03-03 DIAGNOSIS — R946 Abnormal results of thyroid function studies: Secondary | ICD-10-CM

## 2016-03-03 LAB — CBC
HCT: 37.8 % (ref 35.0–45.0)
Hemoglobin: 12.9 g/dL (ref 11.7–15.5)
MCH: 33.1 pg — ABNORMAL HIGH (ref 27.0–33.0)
MCHC: 34.1 g/dL (ref 32.0–36.0)
MCV: 96.9 fL (ref 80.0–100.0)
MPV: 9.8 fL (ref 7.5–12.5)
PLATELETS: 309 10*3/uL (ref 140–400)
RBC: 3.9 MIL/uL (ref 3.80–5.10)
RDW: 14.3 % (ref 11.0–15.0)
WBC: 8.2 10*3/uL (ref 3.8–10.8)

## 2016-03-03 LAB — T4, FREE: FREE T4: 1.1 ng/dL (ref 0.8–1.8)

## 2016-03-03 NOTE — Progress Notes (Signed)
Cardiology Office Note    Date:  03/03/2016   ID:  Jody Ruiz, Jody Ruiz [birth year], MRN [id number]  PCP:  Geoffery Lyons, MD  Cardiologist:  Dr. Martinique  Pulmonologist: Dr. Elsworth Soho  Chief Complaint  Patient presents with  . Follow-up    Martinique  pt c/o extreme fatigue and chest/neck discomfort, nausea, SOB on minimal exertion, numbness in left hand; states it is everyday and she cannot do anything    History of Present Illness:  Jody Ruiz is a 75 y.o. female with PMH of OSA on CPAP, HTN, idiopathic pulmonary fibrosis, HLD intolerance to statins, borderline hypothyroidism, and CAD. She had a NSTEMI in October 2013, she underwent stenting of tandem lesions in the proximal and LAD with 3.0 x 16 mm and 2.5 x 28 mm Promus DES respectively. She underwent repeat cardiac catheterization in January 2014 after presented with recurrent symptoms, this showed patent stents. In September 2014, she had a Myoview study suggested some apical ischemia, repeat cardiac catheterization continued to show patency at the time. She was admitted in December 2015 with chest pain, cough and dyspnea. She had left and right heart cath on 08/02/2014 which showed normal right-sided pressures, diffuse 60% stenosis in the very small-caliber LAD as it wraps the apex, 20% mild nonobstructive disease in left circumflex, 20% stenosis in mid OM2 lesion, 50-60% stenosis in mid posterolateral branch of RCA, 30% proximal RCA stenosis EF 60-65%.. Echocardiogram was normal, however she subsequently diagnosed with ILD. She was last seen by Dr. Martinique on 12/12/2015 at which time she was doing okay from cardiac perspective. She had colonoscopy on 06/06/2015, 6 polyps were removed, pathology was negative for high-grade dysplasia. Her last pulmonary function test was obtained in December 2016 which showed diffusion defect consistent with pulmonary vascular process, although anemia cannot be excluded as a potential cause, overall mild  diffusion defect. She had a MRI of the brain in March 2017 that was negative for acute process.  For the past 1-22 weeks, she has been having intermittent chest pain on the left side. She described it as both a sharp pain in the toe pain. It feels different from her original angina which she described as a sharp stabbing pain. She also has been having several month of fatigue. She does take care of her husband who has a lot of medical issues, she denies any increased stress recently. She mentions she can barely function without having to rest. She also admits to have some shortness of breath, however no shortness of breath at rest. She has been seen by Dr. Elsworth Soho and followed by pulmonology for idiopathic pulmonary fibrosis. She does denies any lower extremity edema, orthopnea of PND episodes. She had some nausea since the weekend, but no vomiting. She has had multiple cardiac catheterization since her originally stent placement, I am hesitant to pursue another invasive workup at this time. I think she would benefit more from noninvasive workup. I will obtain secondary workup for fatigue including CBC, BMP, and a TSH/free T4. I will also obtain echocardiogram and treadmill Myoview as well.   Past Medical History:  Diagnosis Date  . Arthritis   . Borderline hypothyroidism   . CAD (coronary artery disease)    a. 04/2012 NSTEMI/Cath:  pLAD 90%, mLAD 70-80% (long) (3x16 & 2.5x28 Promus DES to p/m LAD), pD1 70-80%, pRI 30% (small), CFX 20%, OM1 40%, mOM2 30%, pRCA 30%, PDA 30-40%, pPLB 70%, then mid 90%, EF 65%;  b. 03/2013 Abnl CL w/apical isch;  c. 04/2013 Cath: LM nl, LAD patent stents, LCX <20, RCA 30p PDA 40-50 EF 55-60% ->Cont Med Rx.  c. CP s/p LHC with stable dz and patent stents--> Rx medically   . Cataract   . Dyspnea    a. CP and SOB 06/2012 => Ticagrelor changed to Plavix  . HLD (hyperlipidemia)    intolerant to statins  . Hx of adenomatous colonic polyps   . Hx of echocardiogram    a. Echo  2/14:  mild LVH, EF 55-65%, Gr 1 diast dysfn, mild LAE  . Hypertension   . Idiopathic pulmonary fibrosis   . Internal hemorrhoids   . Lung nodules   . Microscopic colitis   . Myocardial infarction ([place]) 2013  . Obesity   . OSA (obstructive sleep apnea)    a. on CPAP  . Sleep apnea    cpap    Past Surgical History:  Procedure Laterality Date  . APPENDECTOMY  1994  . BREAST BIOPSY  1965, 1975   bilateral  . CARDIAC CATHETERIZATION     x4  . CHOLECYSTECTOMY  2004  . CORONARY STENT PLACEMENT  04/2012   x2  . KNEE ARTHROSCOPY  2000   bilateral  . LEFT AND RIGHT HEART CATHETERIZATION WITH CORONARY ANGIOGRAM N/A 08/02/2014   Procedure: LEFT AND RIGHT HEART CATHETERIZATION WITH CORONARY ANGIOGRAM;  Surgeon: Burnell Blanks, MD;  Location: [facility] CATH LAB;  Service: Cardiovascular;  Laterality: N/A;  . LEFT HEART CATHETERIZATION WITH CORONARY ANGIOGRAM N/A 04/29/2012   Procedure: LEFT HEART CATHETERIZATION WITH CORONARY ANGIOGRAM;  Surgeon: Peter M Martinique, MD;  Location: [facility] CATH LAB;  Service: Cardiovascular;  Laterality: N/A;  . LEFT HEART CATHETERIZATION WITH CORONARY ANGIOGRAM N/A 08/03/2012   Procedure: LEFT HEART CATHETERIZATION WITH CORONARY ANGIOGRAM;  Surgeon: Burnell Blanks, MD;  Location: [facility] CATH LAB;  Service: Cardiovascular;  Laterality: N/A;  . LEFT HEART CATHETERIZATION WITH CORONARY ANGIOGRAM N/A 04/12/2013   Procedure: LEFT HEART CATHETERIZATION WITH CORONARY ANGIOGRAM;  Surgeon: Peter M Martinique, MD;  Location: [facility] CATH LAB;  Service: Cardiovascular;  Laterality: N/A;  . PERCUTANEOUS CORONARY STENT INTERVENTION (PCI-S) N/A 04/29/2012   Procedure: PERCUTANEOUS CORONARY STENT INTERVENTION (PCI-S);  Surgeon: Peter M Martinique, MD;  Location: [facility] CATH LAB;  Service: Cardiovascular;  Laterality: N/A;  . SHOULDER ARTHROSCOPY  2012   left  . vaginal polypectomy  2004  . VENTRAL HERNIA REPAIR  2005    Current Medications: Outpatient Medications Prior to Visit  Medication  Sig Dispense Refill  . amLODipine (NORVASC) 5 MG tablet TAKE 1 TABLET DAILY 30 tablet 11  . aspirin EC 81 MG tablet Take 81 mg by mouth daily.    . isosorbide mononitrate (IMDUR) 60 MG 24 hr tablet TAKE ONE TABLET AT BEDTIME 90 tablet 0  . metoprolol succinate (TOPROL XL) 50 MG 24 hr tablet Take 1 tablet (50 mg total) by mouth daily. Take with or immediately following a meal.    . nitroGLYCERIN (NITROSTAT) 0.4 MG SL tablet Place 1 tablet (0.4 mg total) under the tongue every 5 (five) minutes x 3 doses as needed for chest pain. 25 tablet 12  . polyvinyl alcohol (LIQUIFILM TEARS) 1.4 % ophthalmic solution Place 1 drop into both eyes as needed. For dry eyes    . amoxicillin-clavulanate (AUGMENTIN) 875-125 MG tablet     . neomycin-polymyxin b-dexamethasone (MAXITROL) 3.5-10000-0.1 SUSP INSTILL 1 DROP IN THE RIGHT EYE FOUR TIMES A DAY  0   No facility-administered medications prior to visit.  Allergies:   Brilinta [ticagrelor]; Lisinopril; Statins; Zetia [ezetimibe]; Macrodantin [nitrofurantoin macrocrystal]; and Sulfa antibiotics   Social History   Social History  . Marital status: Married    Spouse name: N/A  . Number of children: 3  . Years of education: N/A   Occupational History  . Retired Therapist, sports Retired   Social History Main Topics  . Smoking status: Never Smoker  . Smokeless tobacco: Never Used  . Alcohol use No  . Drug use: No  . Sexual activity: Not Asked   Other Topics Concern  . None   Social History Narrative  . None     Family History:  The patient's family history includes Breast cancer in her sister; Colon cancer (age of onset: 63) in her sister; Colon polyps in her father; Heart disease (age of onset: 33) in her brother; Heart disease (age of onset: 54) in her mother; Heart disease (age of onset: 49) in her father; Kidney disease in her father; Prostate cancer in her father.   ROS:   Please see the history of present illness.    ROS All other systems reviewed  and are negative.   PHYSICAL EXAM:   VS:  BP 110/68 (BP Location: Left Arm, Patient Position: Sitting, Cuff Size: Normal)   Pulse 63   Ht 5\' 7"  (1.702 m)   Wt 218 lb (98.9 kg)   BMI 34.14 kg/m    GEN: Well nourished, well developed, in no acute distress  HEENT: normal  Neck: no JVD, carotid bruits, or masses Cardiac: RRR; no murmurs, rubs, or gallops,no edema  Respiratory:  clear to auscultation bilaterally, normal work of breathing GI: soft, nontender, nondistended, + BS MS: no deformity or atrophy  Skin: warm and dry, no rash Neuro:  Alert and Oriented x 3, Strength and sensation are intact Psych: euthymic mood, full affect  Wt Readings from Last 3 Encounters:  03/03/16 218 lb (98.9 kg)  12/12/15 217 lb 3.2 oz (98.5 kg)  11/21/15 215 lb (97.5 kg)      Studies/Labs Reviewed:   EKG:  EKG is ordered today.  The ekg ordered today demonstrates low voltage, but TWI in 1 and aVL  Recent Labs: No results found for requested labs within last 8760 hours.   Lipid Panel    Component Value Date/Time   CHOL 165 04/29/2012 0420   TRIG 132 04/29/2012 0420   HDL 43 04/29/2012 0420   CHOLHDL 3.8 04/29/2012 0420   VLDL 26 04/29/2012 0420   LDLCALC 96 04/29/2012 0420    Additional studies/ records that were reviewed today include:   Cath 08/02/2014 Impression: 1. Double vessel CAD with patent stents LAD 2. Moderate stenosis in small caliber posterolateral branch that does not appear to be flow limiting.  3. Normal LV systolic function 4. Non-cardiac chest pain 5. Normal filling pressures  Recommendations: Continue medical management of CAD. I do not think I can explain her chest pain based on the degree of CAD.      PFT 06/27/2015 Interpretation: The airway resistance is normal. The reduced diffusing capacity indicates a mild loss of functional alveolar capillary surface. However, the diffusing capacity was not corrected for the patient's hemoglobin. Conclusions: The  diffusion defect is consistent with a pulmonary vascular process. Anemia cannot be excluded as a potential cause of the diffusion defect without correcting the observed diffusing capacity for hemoglobin. Pulmonary Function Diagnosis: Mild Diffusion Defect -Pulmonary Vascular    Echo 08/03/2014 LV EF: 55% -  60%  ------------------------------------------------------------------- Indications:  Chest pain 786.51. Dyspnea 786.09.  ------------------------------------------------------------------- History:  PMH:  Altered mental status. Coronary artery disease. Risk factors: Hypertension. Obese. Dyslipidemia.  ------------------------------------------------------------------- Study Conclusions  - Left ventricle: The cavity size was normal. Wall thickness was increased in a pattern of mild LVH. Systolic function was normal. The estimated ejection fraction was in the range of 55% to 60%. Wall motion was normal; there were no regional wall motion abnormalities. Doppler parameters are consistent with abnormal left ventricular relaxation (grade 1 diastolic dysfunction). The E/e&' ratio is <8, suggesting normal LV filling pressure. - Aortic valve: Structurally normal valve. Trileaflet. There was trivial regurgitation. - Mitral valve: Calcified annulus. There was no significant regurgitation.  Impressions:  - Compared to the prior echo in 08/2012, there has been no significant change.     MRI of brain 10/03/2015 IMPRESSION: 1. No acute intracranial abnormality or mass. 2. Mild chronic small vessel ischemic disease, slightly progressed from prior.   ASSESSMENT:    1. Chest pain, unspecified chest pain type   2. Other fatigue   3. OSA on CPAP   4. Essential hypertension   5. Pulmonary fibrosis ([place])   6. Hyperlipidemia   7. Borderline hypothyroidism   8. Coronary artery disease involving native coronary artery of native heart with angina pectoris  ([place])      PLAN:  In order of problems listed above:  1. Chest pain  - Chest pain appears to be different in characteristic compared to the previous angina, she does have mild T-wave inversion in lead 1 and aVL, plan to obtain repeat echocardiogram and Myoview. Note previous cardiac catheterization showed 60% stenosis in the apex at distal LAD and posterolateral branch of left circumflex.  2. Fatigue: Unclear cause for fatigue, has been going on for several month period will rule out secondary causes via CBC, BMP, and TSH/free T4. She has a history of borderline hypothyroidism. If no obvious culprit for her fatigue is seen, may consider decreasing Toprol-XL on follow-up to 25 mg daily.  3. CAD:   - NSTEMI in October 2013, she underwent stenting of tandem lesions in the proximal and LAD with 3.0 x 16 mm and 2.5 x 28 mm Promus DES respectively  - repeat cardiac catheterization in January 2014 after presented with recurrent symptoms, this showed patent stents.  - September 2014, she had a Myoview study suggested some apical ischemia, repeat cardiac catheterization continued to show patency at the time.   - left and right heart cath on 08/02/2014 which showed normal right-sided pressures, diffuse 60% stenosis in the very small-caliber LAD as it wraps the apex, 20% mild nonobstructive disease in left circumflex, 20% stenosis in mid OM2 lesion, 50-60% stenosis in mid posterolateral branch of RCA, 30% proximal RCA stenosis EF 60-65%.  4. IPF: not on O2. Followed by Dr. Elsworth Soho, no exacerbation  5. Borderline hypothyroidism: check free T4/TSH today  6. HTN: Blood pressure. Will controlled on current medications including amlodipine, Imdur and metoprolol.  7. HLD intolerant to statins: will defer FLP to PCP  8. OSA on CPAP: Only partially compliant, which may contribute to her fatigue like symptoms, however does not explain her chest pain.    Medication Adjustments/Labs and Tests Ordered: Current  medicines are reviewed at length with the patient today.  Concerns regarding medicines are outlined above.  Medication changes, Labs and Tests ordered today are listed in the Patient Instructions below. Patient Instructions  Medication Instructions:  Your physician recommends that you continue on your current medications as directed.  Please refer to the Current Medication list given to you today.  Labwork: Your physician recommends that you return for lab work in: TODAY-BMET, CBC, TSH, FREE T4   Testing/Procedures: Your physician has requested that you have an echocardiogram. Echocardiography is a painless test that uses sound waves to create images of your heart. It provides your doctor with information about the size and shape of your heart and how well your heart's chambers and valves are working. This procedure takes approximately one hour. There are no restrictions for this procedure.  Your physician has requested that you have en exercise stress myoview. For further information please visit [email or web address]. Please follow instruction sheet, as given.  Follow-Up: Your physician recommends that you schedule a follow-up appointment in: 2 MONTHS WITH DR Martinique.   Any Other Special Instructions Will Be Listed Below (If Applicable).     If you need a refill on your cardiac medications before your next appointment, please call your pharmacy.     Hilbert Corrigan, [place]  03/03/2016 12:28 PM    [facility] [address], [place], [place]  [address] Phone: [phone]; Fax: [phone]

## 2016-03-03 NOTE — Patient Instructions (Signed)
Medication Instructions:  Your physician recommends that you continue on your current medications as directed. Please refer to the Current Medication list given to you today.  Labwork: Your physician recommends that you return for lab work in: TODAY-BMET, CBC, TSH, FREE T4   Testing/Procedures: Your physician has requested that you have an echocardiogram. Echocardiography is a painless test that uses sound waves to create images of your heart. It provides your doctor with information about the size and shape of your heart and how well your heart's chambers and valves are working. This procedure takes approximately one hour. There are no restrictions for this procedure.  Your physician has requested that you have en exercise stress myoview. For further information please visit [email or web address]. Please follow instruction sheet, as given.  Follow-Up: Your physician recommends that you schedule a follow-up appointment in: 2 MONTHS WITH DR Martinique.   Any Other Special Instructions Will Be Listed Below (If Applicable).     If you need a refill on your cardiac medications before your next appointment, please call your pharmacy.

## 2016-03-06 ENCOUNTER — Ambulatory Visit: Payer: Medicare Other | Admitting: Cardiology

## 2016-03-11 DIAGNOSIS — N958 Other specified menopausal and perimenopausal disorders: Secondary | ICD-10-CM | POA: Diagnosis not present

## 2016-03-11 DIAGNOSIS — Z6835 Body mass index (BMI) 35.0-35.9, adult: Secondary | ICD-10-CM | POA: Diagnosis not present

## 2016-03-11 DIAGNOSIS — N9089 Other specified noninflammatory disorders of vulva and perineum: Secondary | ICD-10-CM | POA: Diagnosis not present

## 2016-03-11 DIAGNOSIS — M8588 Other specified disorders of bone density and structure, other site: Secondary | ICD-10-CM | POA: Diagnosis not present

## 2016-03-11 DIAGNOSIS — Z124 Encounter for screening for malignant neoplasm of cervix: Secondary | ICD-10-CM | POA: Diagnosis not present

## 2016-03-13 DIAGNOSIS — L509 Urticaria, unspecified: Secondary | ICD-10-CM | POA: Diagnosis not present

## 2016-03-14 ENCOUNTER — Other Ambulatory Visit: Payer: Self-pay

## 2016-03-14 ENCOUNTER — Telehealth: Payer: Self-pay

## 2016-03-14 DIAGNOSIS — R0789 Other chest pain: Secondary | ICD-10-CM

## 2016-03-14 NOTE — Telephone Encounter (Signed)
Spoke to patient.I received a message from scheduler you needed appointment with Dr.Jordan in 2 months.Advised pt Dr.Jordan's schedule is full.Appointment scheduled 06/16/16 at 2:30 pm.Advised to call sooner if needed.Pt stated she received lab results but bmet and tsh never done.Spoke to [employer] and tsh were not ordered.Advised pt she can have done on Monday 03/17/16.

## 2016-03-18 ENCOUNTER — Other Ambulatory Visit: Payer: Self-pay

## 2016-03-18 ENCOUNTER — Ambulatory Visit (HOSPITAL_COMMUNITY): Payer: Medicare Other | Attending: Cardiology

## 2016-03-18 DIAGNOSIS — E785 Hyperlipidemia, unspecified: Secondary | ICD-10-CM | POA: Diagnosis not present

## 2016-03-18 DIAGNOSIS — R5383 Other fatigue: Secondary | ICD-10-CM | POA: Diagnosis not present

## 2016-03-18 DIAGNOSIS — Z6834 Body mass index (BMI) 34.0-34.9, adult: Secondary | ICD-10-CM | POA: Insufficient documentation

## 2016-03-18 DIAGNOSIS — R079 Chest pain, unspecified: Secondary | ICD-10-CM | POA: Insufficient documentation

## 2016-03-18 DIAGNOSIS — I119 Hypertensive heart disease without heart failure: Secondary | ICD-10-CM | POA: Diagnosis not present

## 2016-03-18 DIAGNOSIS — E669 Obesity, unspecified: Secondary | ICD-10-CM | POA: Diagnosis not present

## 2016-03-18 DIAGNOSIS — R072 Precordial pain: Secondary | ICD-10-CM | POA: Diagnosis not present

## 2016-03-18 DIAGNOSIS — I2510 Atherosclerotic heart disease of native coronary artery without angina pectoris: Secondary | ICD-10-CM | POA: Insufficient documentation

## 2016-03-18 LAB — ECHOCARDIOGRAM COMPLETE
CHL CUP LV S' LATERAL: 11.6 cm/s
CHL CUP PV REG GRAD DIAS: 5 mmHg
CHL CUP REG VEL DIAS: 114 cm/s
E/e' ratio: 6.93
EWDT: 197 ms
FS: 29 % (ref 28–44)
IVS/LV PW RATIO, ED: 1.08
LA vol A4C: 61 ml
LA vol index: 24.8 mL/m2
LA vol: 52 mL
LADIAMINDEX: 1.95 cm/m2
LASIZE: 41 mm
LEFT ATRIUM END SYS DIAM: 41 mm
LV E/e' medial: 6.93
LV PW d: 11 mm — AB (ref 0.6–1.1)
LV e' LATERAL: 7.75 cm/s
LVEEAVG: 6.93
LVOT VTI: 21.7 cm
LVOT area: 3.46 cm2
LVOT diameter: 21 mm
LVOT peak vel: 86.4 cm/s
LVOTSV: 75 mL
MV Dec: 197
MVPKAVEL: 82.3 m/s
MVPKEVEL: 53.7 m/s
TDI e' lateral: 7.75
TDI e' medial: 4.89

## 2016-03-19 ENCOUNTER — Telehealth (HOSPITAL_COMMUNITY): Payer: Self-pay

## 2016-03-19 LAB — BASIC METABOLIC PANEL
BUN: 20 mg/dL (ref 7–25)
CALCIUM: 9 mg/dL (ref 8.6–10.4)
CHLORIDE: 103 mmol/L (ref 98–110)
CO2: 26 mmol/L (ref 20–31)
Creat: 0.91 mg/dL (ref 0.60–0.93)
Glucose, Bld: 110 mg/dL — ABNORMAL HIGH (ref 65–99)
POTASSIUM: 4.7 mmol/L (ref 3.5–5.3)
SODIUM: 140 mmol/L (ref 135–146)

## 2016-03-19 LAB — TSH: TSH: 5.79 m[IU]/L — AB

## 2016-03-19 NOTE — Telephone Encounter (Signed)
Encounter complete. 

## 2016-03-21 ENCOUNTER — Ambulatory Visit (HOSPITAL_COMMUNITY)
Admission: RE | Admit: 2016-03-21 | Discharge: 2016-03-21 | Disposition: A | Discharge status: Home / Self Care | Payer: Medicare Other | Source: Ambulatory Visit | Attending: Cardiology | Admitting: Cardiology

## 2016-03-21 ENCOUNTER — Telehealth: Payer: Self-pay | Admitting: Cardiology

## 2016-03-21 DIAGNOSIS — R079 Chest pain, unspecified: Secondary | ICD-10-CM | POA: Insufficient documentation

## 2016-03-21 DIAGNOSIS — J84112 Idiopathic pulmonary fibrosis: Secondary | ICD-10-CM | POA: Insufficient documentation

## 2016-03-21 DIAGNOSIS — G4733 Obstructive sleep apnea (adult) (pediatric): Secondary | ICD-10-CM | POA: Insufficient documentation

## 2016-03-21 DIAGNOSIS — Z8249 Family history of ischemic heart disease and other diseases of the circulatory system: Secondary | ICD-10-CM | POA: Diagnosis not present

## 2016-03-21 DIAGNOSIS — I252 Old myocardial infarction: Secondary | ICD-10-CM | POA: Insufficient documentation

## 2016-03-21 DIAGNOSIS — I2510 Atherosclerotic heart disease of native coronary artery without angina pectoris: Secondary | ICD-10-CM | POA: Insufficient documentation

## 2016-03-21 MED ORDER — TECHNETIUM TC 99M TETROFOSMIN IV KIT
10.5000 | PACK | Freq: Once | INTRAVENOUS | Status: AC | PRN
  Administered 2016-03-21: 11 via INTRAVENOUS
  Filled 2016-03-21: qty 11

## 2016-03-21 MED ORDER — TECHNETIUM TC 99M TETROFOSMIN IV KIT
31.1000 | PACK | Freq: Once | INTRAVENOUS | Status: AC | PRN
  Administered 2016-03-21: 31.1 via INTRAVENOUS
  Filled 2016-03-21: qty 31

## 2016-03-21 NOTE — Telephone Encounter (Signed)
Returned results call. Pt wanting to discuss w Malachy Mood the recent labs. I noted the TSH was borderline high, Hao recommended freeT4 level - pt notes she just had this done 2 weeks ago, so should she be concerned - follow recommendation to see PCP for further workup? Requests Malachy Mood to call today or Monday if possible. Aware I will route.

## 2016-03-21 NOTE — Telephone Encounter (Signed)
New Message  Pt call stating she received a call from our office. Please call back to discuss

## 2016-03-21 NOTE — Telephone Encounter (Signed)
Returned call to patient.Advised Jody Ruiz reviewed recent TSH 5.79.He advised to follow up with PCP.Copy of lab faxed to PCP Dr.Richard Reynaldo Minium.

## 2016-03-24 ENCOUNTER — Other Ambulatory Visit: Payer: Self-pay | Admitting: Cardiology

## 2016-03-24 ENCOUNTER — Telehealth: Payer: Self-pay | Admitting: Cardiology

## 2016-03-24 NOTE — Telephone Encounter (Signed)
Returning call,she thinks it was her stress test results.

## 2016-03-24 NOTE — Telephone Encounter (Signed)
Returned call to patient-made aware of results:  Notes Recorded by Almyra Deforest, PA on 03/21/2016 at 9:48 AM EDT Echo is reasurring, unchanged when compare to previous echo in Jan 2016, normal pumping function of heart, mild thickening of heart wall which will be monitored but should not cause any problem. Note previous echo in 2016 also showed mild thickening, therefore this is unchanged  Pt verbalized understanding.  No further questions/concerns at this time.

## 2016-03-25 LAB — MYOCARDIAL PERFUSION IMAGING
CHL CUP RESTING HR STRESS: 56 {beats}/min
CSEPED: 5 min
CSEPEDS: 0 s
CSEPEW: 7 METS
CSEPHR: 92 %
CSEPPHR: 136 {beats}/min
LV sys vol: 34 mL
LVDIAVOL: 90 mL (ref 46–106)
MPHR: 147 {beats}/min
RPE: 18
SDS: 3
SRS: 1
SSS: 4
TID: 0.97

## 2016-04-04 DIAGNOSIS — E784 Other hyperlipidemia: Secondary | ICD-10-CM | POA: Diagnosis not present

## 2016-04-04 DIAGNOSIS — E038 Other specified hypothyroidism: Secondary | ICD-10-CM | POA: Diagnosis not present

## 2016-04-04 DIAGNOSIS — E559 Vitamin D deficiency, unspecified: Secondary | ICD-10-CM | POA: Diagnosis not present

## 2016-04-04 DIAGNOSIS — I10 Essential (primary) hypertension: Secondary | ICD-10-CM | POA: Diagnosis not present

## 2016-04-04 DIAGNOSIS — R7302 Impaired glucose tolerance (oral): Secondary | ICD-10-CM | POA: Diagnosis not present

## 2016-04-04 DIAGNOSIS — R8290 Unspecified abnormal findings in urine: Secondary | ICD-10-CM | POA: Diagnosis not present

## 2016-04-04 DIAGNOSIS — N390 Urinary tract infection, site not specified: Secondary | ICD-10-CM | POA: Diagnosis not present

## 2016-04-11 DIAGNOSIS — Z23 Encounter for immunization: Secondary | ICD-10-CM | POA: Diagnosis not present

## 2016-04-11 DIAGNOSIS — Z0000 Encounter for general adult medical examination without abnormal findings: Secondary | ICD-10-CM | POA: Diagnosis not present

## 2016-04-11 DIAGNOSIS — M1990 Unspecified osteoarthritis, unspecified site: Secondary | ICD-10-CM | POA: Diagnosis not present

## 2016-04-11 DIAGNOSIS — E784 Other hyperlipidemia: Secondary | ICD-10-CM | POA: Diagnosis not present

## 2016-04-11 DIAGNOSIS — E038 Other specified hypothyroidism: Secondary | ICD-10-CM | POA: Diagnosis not present

## 2016-04-11 DIAGNOSIS — I10 Essential (primary) hypertension: Secondary | ICD-10-CM | POA: Diagnosis not present

## 2016-04-11 DIAGNOSIS — J84112 Idiopathic pulmonary fibrosis: Secondary | ICD-10-CM | POA: Diagnosis not present

## 2016-04-11 DIAGNOSIS — K589 Irritable bowel syndrome without diarrhea: Secondary | ICD-10-CM | POA: Diagnosis not present

## 2016-04-11 DIAGNOSIS — R7989 Other specified abnormal findings of blood chemistry: Secondary | ICD-10-CM | POA: Diagnosis not present

## 2016-04-11 DIAGNOSIS — Z1212 Encounter for screening for malignant neoplasm of rectum: Secondary | ICD-10-CM | POA: Diagnosis not present

## 2016-05-19 ENCOUNTER — Ambulatory Visit (INDEPENDENT_AMBULATORY_CARE_PROVIDER_SITE_OTHER): Payer: Medicare Other | Admitting: Pulmonary Disease

## 2016-05-19 ENCOUNTER — Telehealth: Payer: Self-pay | Admitting: Pulmonary Disease

## 2016-05-19 ENCOUNTER — Encounter: Payer: Self-pay | Admitting: Pulmonary Disease

## 2016-05-19 VITALS — BP 126/70 | HR 80 | Ht 67.0 in | Wt 213.4 lb

## 2016-05-19 DIAGNOSIS — I2510 Atherosclerotic heart disease of native coronary artery without angina pectoris: Secondary | ICD-10-CM

## 2016-05-19 DIAGNOSIS — J84112 Idiopathic pulmonary fibrosis: Secondary | ICD-10-CM | POA: Diagnosis not present

## 2016-05-19 DIAGNOSIS — G4733 Obstructive sleep apnea (adult) (pediatric): Secondary | ICD-10-CM

## 2016-05-19 NOTE — Patient Instructions (Signed)
High-resolution CT scan of the chest Schedule full PFTs  Based on these results, we will decide about starting you on medication for pulmonary fibrosis We discussed side effects  We will ask [facility] to send [place] a report on your machine

## 2016-05-19 NOTE — Telephone Encounter (Signed)
Spoke with pt for over 9 minutes as she expressed her frustration with her office visit today.  Pt does not wish to schedule an office visit at this time.  Will call back to schedule follow up after she reflects on her visit today.    Will close encounter.

## 2016-05-19 NOTE — Assessment & Plan Note (Signed)
We'll review download again and make adjustments as needed  Weight loss encouraged, compliance with goal of at least 4-6 hrs every night is the expectation. Advised against medications with sedative side effects Cautioned against driving when sleepy - understanding that sleepiness will vary on a day to day basis

## 2016-05-19 NOTE — Telephone Encounter (Signed)
Spoke with pt who was very argumentative and determined to get in with RA sooner then 06-25-16. Pt states she didn't understand the need for these test if we were doing to wait until 06-25-16 to determine rather or not she needs medication. I also offered pt an appointment with another MD and pt also refused.  RA please advise. Thanks.

## 2016-05-19 NOTE — Progress Notes (Signed)
   Subjective:    Patient ID: Jody Ruiz, female    DOB: December 07, 1940, 75 y.o.   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 Amil Amen 08/2014 felt that  ANA 1:320 was  false-positive, SSA was weakly positive, but SSB negative hence not Sjogren's   05/19/2016  Chief Complaint  Patient presents with  . Follow-up    Pt. states her breath is ok, States at times she feels more SOB, coughing but unable to produce anything, Pt. denies chest pain, wheezing,    She feels that her breathing is slightly worse, more dyspneic on exertion No pedal edema, chest pain or orthopnea Cardiac evaluation was normal  Has severe OSA and is compliant with CPAP, no problems with mask or pressure, download is not available today    Significant tests/ events  Elevated rt hemi-D due to eventration, Neg sniff test , Neg PE on CT  Nml EF, grade 1 diastolic on echo  nml bnp  CT chest showed Subpleural fibrosis at the lung bases & sub -sm nodles Rt fissure, largest 41mm   PSG showed AHI 66/h with predominant hypopneas & nadir desatn of 79% corrected by CPAP 11 cm, with med full face mask.  Download 11/2012 on 12 cm showed residual 9/h, good compliance, small leak   For bloating ,changed to auto--> Download on auto 5-11 02/04/13 >> residual 7/h  Good usage, min leak , bloating improved    CT chest 06/2014 > UIP pattern, lack of progression compared to 07/2013 PFTs  06/2015 stable    Review of Systems neg for any significant sore throat, dysphagia, itching, sneezing, nasal congestion or excess/ purulent secretions, fever, chills, sweats, unintended wt loss, pleuritic or exertional cp, hempoptysis, orthopnea pnd or change in chronic  leg swelling. Also denies presyncope, palpitations, heartburn, abdominal pain, nausea, vomiting, diarrhea or change in bowel or urinary habits, dysuria,hematuria, rash, arthralgias, visual complaints, headache, numbness weakness or ataxia.     Objective:   Physical Exam  Gen. Pleasant, obese, in no distress, normal affect ENT - no lesions, no post nasal drip, class 2-3 airway Neck: No JVD, no thyromegaly, no carotid bruits Lungs: no use of accessory muscles, no dullness to percussion, bibasal rales, no rhonchi  Cardiovascular: Rhythm regular, heart sounds  normal, no murmurs or gallops, no peripheral edema Abdomen: soft and non-tender, no hepatosplenomegaly, BS normal. Musculoskeletal: No deformities, no cyanosis or clubbing Neuro:  alert, non focal, no tremors        Assessment & Plan:

## 2016-05-19 NOTE — Assessment & Plan Note (Signed)
We will repeat high-resolution CT scan of the chest obtained full PFTs to look for progression of disease.  In the past her CT had shown typical features with basal predominance, and honeycombing-but lack of progression compared to 07/2013 was considered atypical. We also held off starting of medications due to her predominant GI symptoms in the past and for concern of side effects with anti-fibrotic medications. She is more agreeable to starting this now I reviewed her LFTs from her PCP in 03/2016 which were normal.

## 2016-05-19 NOTE — Telephone Encounter (Signed)
Please add on at 12/20 at 2 PM

## 2016-05-19 NOTE — Telephone Encounter (Signed)
Please let her know that based on test results, we will get her started on medication She will not have to wait until follow-up visit to start a medication

## 2016-05-19 NOTE — Telephone Encounter (Signed)
RA pt refuses to follow up with TP, will only follow up with you.  After this week your next clinic date is 12/19, which you're 100% booked.  Please advise, thanks!

## 2016-05-20 ENCOUNTER — Ambulatory Visit (INDEPENDENT_AMBULATORY_CARE_PROVIDER_SITE_OTHER): Payer: Medicare Other | Admitting: Internal Medicine

## 2016-05-20 ENCOUNTER — Encounter: Payer: Self-pay | Admitting: Internal Medicine

## 2016-05-20 VITALS — BP 110/68 | HR 72 | Ht 67.0 in | Wt 213.0 lb

## 2016-05-20 DIAGNOSIS — I2510 Atherosclerotic heart disease of native coronary artery without angina pectoris: Secondary | ICD-10-CM | POA: Diagnosis not present

## 2016-05-20 DIAGNOSIS — A09 Infectious gastroenteritis and colitis, unspecified: Secondary | ICD-10-CM | POA: Diagnosis not present

## 2016-05-20 DIAGNOSIS — R197 Diarrhea, unspecified: Secondary | ICD-10-CM

## 2016-05-20 DIAGNOSIS — R1084 Generalized abdominal pain: Secondary | ICD-10-CM

## 2016-05-20 MED ORDER — METRONIDAZOLE 250 MG PO TABS: 250.0000 mg | ORAL_TABLET | Freq: Four times a day (QID) | ORAL | 1 refills | Status: DC

## 2016-05-20 NOTE — Patient Instructions (Signed)
We have sent the following medications to your pharmacy for you to pick up at your convenience:  Flagyl  Make sure to stay hydrated and follow up as needed

## 2016-05-20 NOTE — Progress Notes (Signed)
HISTORY OF PRESENT ILLNESS:  Jody Ruiz is a 75 y.o. female with multiple significant medical problems including hypertension, obesity, coronary artery disease with history of myocardial infarction and coronary artery stenting, morbid obesity, osteoarthritis, adenomatous colon polyps, sleep apnea, pulmonary fibrosis, and microscopic colitis (previously under the care of Dr. Teena Irani for greater than 1 decade). Patient was last seen in the office 07/05/2013. At that time had problems with acute diarrheal illness empirically treated with Flagyl which responded. Last November underwent surveillance colonoscopy. Found to have multiple tubular adenomas and SSP for which follow-up in 3 years recommended. She presents today with a one-week history of diarrhea. She describes abdominal cramping followed by loose stools with urgency. Pain is relieved post defecation. She has self medicated with probiotic for one week. Food tends to exacerbate symptoms. No nocturnal symptoms. No bleeding. Stools are mushy or watery. 2-6 bowel movements per day. Had been on Entocort previously for microscopic colitis but too expensive. Imodium does work, though she is fearful of constipation. No fevers. No vomiting. Her only other complaint is irritation in the umbilicus.  REVIEW OF SYSTEMS:  All non-GI ROS negative except for fatigue  Past Medical History:  Diagnosis Date  . Arthritis   . Borderline hypothyroidism   . CAD (coronary artery disease)    a. 04/2012 NSTEMI/Cath:  pLAD 90%, mLAD 70-80% (long) (3x16 & 2.5x28 Promus DES to p/m LAD), pD1 70-80%, pRI 30% (small), CFX 20%, OM1 40%, mOM2 30%, pRCA 30%, PDA 30-40%, pPLB 70%, then mid 90%, EF 65%;  b. 03/2013 Abnl CL w/apical isch;  c. 04/2013 Cath: LM nl, LAD patent stents, LCX <20, RCA 30p PDA 40-50 EF 55-60% ->Cont Med Rx.  c. CP s/p LHC with stable dz and patent stents--> Rx medically   . Cataract   . Dyspnea    a. CP and SOB 06/2012 => Ticagrelor changed to  Plavix  . HLD (hyperlipidemia)    intolerant to statins  . Hx of adenomatous colonic polyps   . Hx of echocardiogram    a. Echo 2/14:  mild LVH, EF 55-65%, Gr 1 diast dysfn, mild LAE  . Hypertension   . Idiopathic pulmonary fibrosis   . Internal hemorrhoids   . Lung nodules   . Microscopic colitis   . Myocardial infarction 2013  . Obesity   . OSA (obstructive sleep apnea)    a. on CPAP  . Sleep apnea    cpap    Past Surgical History:  Procedure Laterality Date  . APPENDECTOMY  1994  . BREAST BIOPSY  1965, 1975   bilateral  . CARDIAC CATHETERIZATION     x4  . CHOLECYSTECTOMY  2004  . CORONARY STENT PLACEMENT  04/2012   x2  . KNEE ARTHROSCOPY  2000   bilateral  . LEFT AND RIGHT HEART CATHETERIZATION WITH CORONARY ANGIOGRAM N/A 08/02/2014   Procedure: LEFT AND RIGHT HEART CATHETERIZATION WITH CORONARY ANGIOGRAM;  Surgeon: Burnell Blanks, MD;  Location: [facility] CATH LAB;  Service: Cardiovascular;  Laterality: N/A;  . LEFT HEART CATHETERIZATION WITH CORONARY ANGIOGRAM N/A 04/29/2012   Procedure: LEFT HEART CATHETERIZATION WITH CORONARY ANGIOGRAM;  Surgeon: Peter M Martinique, MD;  Location: [facility] CATH LAB;  Service: Cardiovascular;  Laterality: N/A;  . LEFT HEART CATHETERIZATION WITH CORONARY ANGIOGRAM N/A 08/03/2012   Procedure: LEFT HEART CATHETERIZATION WITH CORONARY ANGIOGRAM;  Surgeon: Burnell Blanks, MD;  Location: [facility] CATH LAB;  Service: Cardiovascular;  Laterality: N/A;  . LEFT HEART CATHETERIZATION WITH CORONARY ANGIOGRAM N/A 04/12/2013  Procedure: LEFT HEART CATHETERIZATION WITH CORONARY ANGIOGRAM;  Surgeon: Peter M Martinique, MD;  Location: [facility] CATH LAB;  Service: Cardiovascular;  Laterality: N/A;  . PERCUTANEOUS CORONARY STENT INTERVENTION (PCI-S) N/A 04/29/2012   Procedure: PERCUTANEOUS CORONARY STENT INTERVENTION (PCI-S);  Surgeon: Peter M Martinique, MD;  Location: [facility] CATH LAB;  Service: Cardiovascular;  Laterality: N/A;  . SHOULDER ARTHROSCOPY  2012   left  . vaginal  polypectomy  2004  . VENTRAL HERNIA REPAIR  2005    Social History ISMELDA CRUPI  reports that she has never smoked. She has never used smokeless tobacco. She reports that she does not drink alcohol or use drugs.  family history includes Breast cancer in her sister; Colon cancer (age of onset: 66) in her sister; Colon polyps in her father; Heart disease (age of onset: 19) in her brother; Heart disease (age of onset: 59) in her mother; Heart disease (age of onset: 53) in her father; Kidney disease in her father; Prostate cancer in her father.  Allergies  Allergen Reactions  . Brilinta [Ticagrelor] Shortness Of Breath and Cough    fatigue  . Lisinopril Shortness Of Breath and Cough  . Statins     Muscle Weakness  . Zetia [Ezetimibe]     Myalgias   . Macrodantin [Nitrofurantoin Macrocrystal] Rash  . Sulfa Antibiotics Rash       PHYSICAL EXAMINATION: Vital signs: BP 110/68 (BP Location: Left Arm, Patient Position: Sitting, Cuff Size: Normal)   Pulse 72   Ht 5\' 7"  (1.702 m)   Wt 213 lb (96.6 kg)   BMI 33.36 kg/m   Constitutional: generally well-appearing, no acute distress Psychiatric: alert and oriented x3, cooperative Eyes: extraocular movements intact, anicteric, conjunctiva pink Mouth: oral pharynx moist, no lesions Neck: supple no lymphadenopathy Cardiovascular: heart regular rate and rhythm, no murmur Lungs: clear to auscultation bilaterally Abdomen: soft, obese, nontender, nondistended, no obvious ascites, no peritoneal signs, normal bowel sounds, no organomegaly. Probable yeast infection in the umbilicus Rectal: Omitted, Extremities: no clubbing cyanosis or lower extremity edema bilaterally Skin: no lesions on visible extremities Neuro: No focal deficits. No asterixis.    ASSESSMENT:  #1. One-week history of abdominal cramping and diarrhea. Multiple potential etiologies. Patient does have history of microscopic colitis #2. History of multiple adenomatous colon  polyps. Last colonoscopy November 2016  PLAN:  #1. Empiric course of metronidazole 250 mg 4 times a day 10 days #2. Push fluids #3. Discuss low-dose Imodium if symptoms unresponsive #4. Discussed possibility of recurrent microscopic colitis and treatment strategies #5. Surveillance colonoscopy 3 years

## 2016-05-26 ENCOUNTER — Encounter: Payer: Self-pay | Admitting: Pulmonary Disease

## 2016-06-02 ENCOUNTER — Ambulatory Visit (HOSPITAL_COMMUNITY)
Admission: RE | Admit: 2016-06-02 | Discharge: 2016-06-02 | Disposition: A | Discharge status: Home / Self Care | Payer: Medicare Other | Source: Ambulatory Visit | Attending: Pulmonary Disease | Admitting: Pulmonary Disease

## 2016-06-02 ENCOUNTER — Telehealth: Payer: Self-pay | Admitting: Pulmonary Disease

## 2016-06-02 ENCOUNTER — Ambulatory Visit (INDEPENDENT_AMBULATORY_CARE_PROVIDER_SITE_OTHER)
Admission: RE | Admit: 2016-06-02 | Discharge: 2016-06-02 | Disposition: A | Discharge status: Home / Self Care | Payer: Medicare Other | Source: Ambulatory Visit | Attending: Pulmonary Disease | Admitting: Pulmonary Disease

## 2016-06-02 DIAGNOSIS — J84112 Idiopathic pulmonary fibrosis: Secondary | ICD-10-CM | POA: Insufficient documentation

## 2016-06-02 DIAGNOSIS — R0602 Shortness of breath: Secondary | ICD-10-CM | POA: Diagnosis not present

## 2016-06-02 DIAGNOSIS — J849 Interstitial pulmonary disease, unspecified: Secondary | ICD-10-CM

## 2016-06-02 LAB — PULMONARY FUNCTION TEST
DL/VA % pred: 64 %
DL/VA: 3.32 ml/min/mmHg/L
DLCO UNC: 14.2 ml/min/mmHg
DLCO unc % pred: 50 %
FEF 25-75 Post: 2.78 L/sec
FEF 25-75 Pre: 2.32 L/sec
FEF2575-%CHANGE-POST: 19 %
FEF2575-%PRED-POST: 145 %
FEF2575-%Pred-Pre: 121 %
FEV1-%CHANGE-POST: 2 %
FEV1-%PRED-PRE: 96 %
FEV1-%Pred-Post: 98 %
FEV1-POST: 2.42 L
FEV1-Pre: 2.36 L
FEV1FVC-%Change-Post: 0 %
FEV1FVC-%PRED-PRE: 110 %
FEV6-%Change-Post: 1 %
FEV6-%Pred-Post: 93 %
FEV6-%Pred-Pre: 91 %
FEV6-POST: 2.9 L
FEV6-PRE: 2.85 L
FEV6FVC-%PRED-POST: 104 %
FEV6FVC-%PRED-PRE: 104 %
FVC-%CHANGE-POST: 1 %
FVC-%PRED-POST: 89 %
FVC-%PRED-PRE: 87 %
FVC-POST: 2.9 L
FVC-PRE: 2.85 L
PRE FEV6/FVC RATIO: 100 %
Post FEV1/FVC ratio: 83 %
Post FEV6/FVC ratio: 100 %
Pre FEV1/FVC ratio: 83 %
RV % PRED: 68 %
RV: 1.64 L
TLC % pred: 83 %
TLC: 4.61 L

## 2016-06-02 MED ORDER — ALBUTEROL SULFATE (2.5 MG/3ML) 0.083% IN NEBU
2.5000 mg | INHALATION_SOLUTION | Freq: Once | RESPIRATORY_TRACT | Status: AC
  Administered 2016-06-02: 2.5 mg via RESPIRATORY_TRACT

## 2016-06-02 NOTE — Telephone Encounter (Signed)
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 Wynn Maudlin

## 2016-06-03 NOTE — Telephone Encounter (Signed)
Order placed. Nothing further needed at this time. 

## 2016-06-05 ENCOUNTER — Telehealth: Payer: Self-pay | Admitting: Pulmonary Disease

## 2016-06-05 NOTE — Telephone Encounter (Signed)
Per Dr Elsworth Soho: CPAP compliance shows poor usage Needs to use at least 4-6 hours per night

## 2016-06-10 NOTE — Telephone Encounter (Signed)
Results have been explained to patient, pt expressed understanding. Nothing further needed.  

## 2016-06-14 NOTE — Progress Notes (Signed)
CARDIOLOGY OFFICE NOTE  Date:  06/16/2016    Jody Ruiz Date of Birth: [birth year] Medical Record [id number]  PCP:  Geoffery Lyons, MD  Cardiologist:  Martinique  Chief Complaint  Patient presents with  . Coronary Artery Disease  . Shortness of Breath     History of Present Illness: Jody Ruiz is a 75 y.o. female is seen today for follow up CAD. She is s/p non-ST elevation myocardial infarction in October 2013. She had stenting of tandem lesions in the proximal and mid LAD with a 3.0 x 16 mm and 2.5 x 28 mm Promus stents respectively. In January 2014 she presented with some recurrent symptoms then repeat cardiac catheterization showed continued patency. In September 2014 she had a Myoview study was suggested some apical ischemia. Repeat cardiac catheterization continued to show stent patency at that time.  She was admitted in December 2015 with chest pain, cough, and dyspnea.  Right and left heart cath essentially normal. Stents patent. Normal pulmonary pressures. Echo was normal. Subsequently diagnosed with ILD.   She was seen at the end of August with atypical chest pain. Myvoview study was normal. Echo showed mild LVH-otherwise normal.   On follow up today she states she is just worn out with all her problems. She has recent trouble with her CPAP. She has a habitual cough and reports her fatigue and SOB are worse. She had follow up chest CT that was stable from 2015. PFTs showed stable lung volumes but drop in diffusion capacity. She is scheduled for a second pulmonary opinion at [facility] next week. She had recent problems with colitis and had another course of Flagyl. She denies any chest pain or palpitations.   Past Medical History:  Diagnosis Date  . Arthritis   . Borderline hypothyroidism   . CAD (coronary artery disease)    a. 04/2012 NSTEMI/Cath:  pLAD 90%, mLAD 70-80% (long) (3x16 & 2.5x28 Promus DES to p/m LAD), pD1 70-80%, pRI 30% (small), CFX 20%, OM1 40%,  mOM2 30%, pRCA 30%, PDA 30-40%, pPLB 70%, then mid 90%, EF 65%;  b. 03/2013 Abnl CL w/apical isch;  c. 04/2013 Cath: LM nl, LAD patent stents, LCX <20, RCA 30p PDA 40-50 EF 55-60% ->Cont Med Rx.  c. CP s/p LHC with stable dz and patent stents--> Rx medically   . Cataract   . Dyspnea    a. CP and SOB 06/2012 => Ticagrelor changed to Plavix  . HLD (hyperlipidemia)    intolerant to statins  . Hx of adenomatous colonic polyps   . Hx of echocardiogram    a. Echo 2/14:  mild LVH, EF 55-65%, Gr 1 diast dysfn, mild LAE  . Hypertension   . Idiopathic pulmonary fibrosis   . Internal hemorrhoids   . Lung nodules   . Microscopic colitis   . Myocardial infarction 2013  . Obesity   . OSA (obstructive sleep apnea)    a. on CPAP  . Sleep apnea    cpap    Past Surgical History:  Procedure Laterality Date  . APPENDECTOMY  1994  . BREAST BIOPSY  1965, 1975   bilateral  . CARDIAC CATHETERIZATION     x4  . CHOLECYSTECTOMY  2004  . CORONARY STENT PLACEMENT  04/2012   x2  . KNEE ARTHROSCOPY  2000   bilateral  . LEFT AND RIGHT HEART CATHETERIZATION WITH CORONARY ANGIOGRAM N/A 08/02/2014   Procedure: LEFT AND RIGHT HEART CATHETERIZATION WITH CORONARY ANGIOGRAM;  Surgeon: Annita Brod  Angelena Form, MD;  Location: [facility] CATH LAB;  Service: Cardiovascular;  Laterality: N/A;  . LEFT HEART CATHETERIZATION WITH CORONARY ANGIOGRAM N/A 04/29/2012   Procedure: LEFT HEART CATHETERIZATION WITH CORONARY ANGIOGRAM;  Surgeon: Peter M Martinique, MD;  Location: [facility] CATH LAB;  Service: Cardiovascular;  Laterality: N/A;  . LEFT HEART CATHETERIZATION WITH CORONARY ANGIOGRAM N/A 08/03/2012   Procedure: LEFT HEART CATHETERIZATION WITH CORONARY ANGIOGRAM;  Surgeon: Burnell Blanks, MD;  Location: [facility] CATH LAB;  Service: Cardiovascular;  Laterality: N/A;  . LEFT HEART CATHETERIZATION WITH CORONARY ANGIOGRAM N/A 04/12/2013   Procedure: LEFT HEART CATHETERIZATION WITH CORONARY ANGIOGRAM;  Surgeon: Peter M Martinique, MD;  Location: [facility]  CATH LAB;  Service: Cardiovascular;  Laterality: N/A;  . PERCUTANEOUS CORONARY STENT INTERVENTION (PCI-S) N/A 04/29/2012   Procedure: PERCUTANEOUS CORONARY STENT INTERVENTION (PCI-S);  Surgeon: Peter M Martinique, MD;  Location: [facility] CATH LAB;  Service: Cardiovascular;  Laterality: N/A;  . SHOULDER ARTHROSCOPY  2012   left  . vaginal polypectomy  2004  . VENTRAL HERNIA REPAIR  2005     Medications: Current Outpatient Prescriptions  Medication Sig Dispense Refill  . amLODipine (NORVASC) 5 MG tablet TAKE 1 TABLET DAILY 30 tablet 11  . aspirin EC 81 MG tablet Take 81 mg by mouth daily.    . Cholecalciferol (VITAMIN D) 2000 units CAPS Take 2,000 Units by mouth daily.    . isosorbide mononitrate (IMDUR) 60 MG 24 hr tablet TAKE ONE TABLET AT BEDTIME 90 tablet 0  . metoprolol succinate (TOPROL XL) 50 MG 24 hr tablet Take 1 tablet (50 mg total) by mouth daily. Take with or immediately following a meal.    . nitroGLYCERIN (NITROSTAT) 0.4 MG SL tablet Place 1 tablet (0.4 mg total) under the tongue every 5 (five) minutes x 3 doses as needed for chest pain. 25 tablet 12  . polyvinyl alcohol (LIQUIFILM TEARS) 1.4 % ophthalmic solution Place 1 drop into both eyes as needed. For dry eyes     No current facility-administered medications for this visit.     Allergies: Allergies  Allergen Reactions  . Brilinta [Ticagrelor] Shortness Of Breath and Cough    fatigue  . Lisinopril Shortness Of Breath and Cough  . Statins     Muscle Weakness  . Zetia [Ezetimibe]     Myalgias   . Macrodantin [Nitrofurantoin Macrocrystal] Rash  . Sulfa Antibiotics Rash    Social History: The patient  reports that she has never smoked. She has never used smokeless tobacco. She reports that she does not drink alcohol or use drugs.   Family History: The patient's family history includes Breast cancer in her sister; Colon cancer (age of onset: 68) in her sister; Colon polyps in her father; Heart disease (age of onset: 73) in  her brother; Heart disease (age of onset: 56) in her mother; Heart disease (age of onset: 73) in her father; Kidney disease in her father; Prostate cancer in her father.   Review of Systems: Please see the history of present illness.    All other systems are reviewed and negative.   Physical Exam: VS:  BP 134/82   Pulse 60   Ht 5' 6.5" (1.689 m)   Wt 211 lb 6.4 oz (95.9 kg)   LMP  (LMP Unknown)   BMI 33.61 kg/m  .  BMI Body mass index is 33.61 kg/m.  General: Pleasant. Well developed, well nourished and in no acute distress. HEENT: Normal.  Neck: Supple, no JVD, carotid bruits, or masses noted.  Cardiac: Regular rate and rhythm. No murmurs, rubs, or gallops. No edema.  Respiratory:  Lungs clear GI: Soft and nontender.  MS: No deformity or atrophy. Gait and ROM intact.  Skin: Warm and dry. Color sallow.   Neuro:  Strength and sensation are intact and no gross focal deficits noted.  Psych: Alert, appropriate and with normal affect. Looks fatigued.    Wt Readings from Last 3 Encounters:  06/16/16 211 lb 6.4 oz (95.9 kg)  05/20/16 213 lb (96.6 kg)  05/19/16 213 lb 6.4 oz (96.8 kg)    LABORATORY DATA:    Lab Results  Component Value Date   WBC 8.2 03/03/2016   HGB 12.9 03/03/2016   HCT 37.8 03/03/2016   PLT 309 03/03/2016   GLUCOSE 110 (H) 03/18/2016   CHOL 165 04/29/2012   TRIG 132 04/29/2012   HDL 43 04/29/2012   LDLCALC 96 04/29/2012   ALT 19 08/03/2014   AST 22 08/03/2014   NA 140 03/18/2016   K 4.7 03/18/2016   CL 103 03/18/2016   CREATININE 0.91 03/18/2016   BUN 20 03/18/2016   CO2 26 03/18/2016   TSH 5.79 (H) 03/18/2016   INR 1.00 08/03/2014    BNP (last 3 results) No results for input(s): PROBNP in the last 8760 hours.   Ecg today shows NSR rate 72. Nonspecific ST-T changes. I have personally reviewed and interpreted this study.  Other Studies Reviewed Today:  Labs from primary care dated 04/04/16; cholesterol 208, triglycerides 139, HDL 39, LDL  141. A1c 5.8%. Bun 17, creatinine 1.1. Other chemistries and TSH normal.  Myoview 03/21/16: Study Highlights    The left ventricular ejection fraction is normal (55-65%).  Nuclear stress EF: 62%.  Blood pressure demonstrated a hypertensive response to exercise.  T wave inversion was noted during stress in the II, aVF, V4, V5, V6, V3 and V2 leads.  The study is normal.  This is a low risk study.   Echo: 03/18/16: Study Conclusions  - Left ventricle: The cavity size was normal. Wall thickness was   increased in a pattern of mild LVH. Systolic function was normal.   The estimated ejection fraction was in the range of 55% to 60%.   Wall motion was normal; there were no regional wall motion   abnormalities. Doppler parameters are consistent with abnormal   left ventricular relaxation (grade 1 diastolic dysfunction). - Left atrium: The atrium was mildly dilated.  Impressions:  - Normal LV systolic function; grade 1 diastolic dysfunction; mild   LVH; mild LAE.  Assessment/Plan:  1. Coronary disease. Status post non-ST elevation myocardial infarction October 2013. Status post stenting of tandem lesions in the proximal and mid LAD. Repeat cardiac catheterization in January 2016 showed continued stent patency and normal right heart pressures. Myoview in September was normal. Continue medical therapy.  2. Dyslipidemia. She is intolerant to statins. On Zetia.  3. Idiopathic pulmonary fibrosis - followed by pulmonary. more dyspnea and drop in diffusion capacity.  Normal right heart pressures by cath. No RV enlargement or pulmonary HTN noted on Echo. Follow up with pulmonary next week.  4. Obstructive sleep apnea.  5. History of colitis.    Current medicines are reviewed with the patient today.  The patient does not have concerns regarding medicines.  The following changes have been made:  See above  Labs/ tests ordered today include:    No orders of the defined types were  placed in this encounter.    Disposition:  follow up  in 6 months.

## 2016-06-16 ENCOUNTER — Encounter: Payer: Self-pay | Admitting: Cardiology

## 2016-06-16 ENCOUNTER — Ambulatory Visit (INDEPENDENT_AMBULATORY_CARE_PROVIDER_SITE_OTHER): Payer: Medicare Other | Admitting: Cardiology

## 2016-06-16 VITALS — BP 134/82 | HR 60 | Ht 66.5 in | Wt 211.4 lb

## 2016-06-16 DIAGNOSIS — I2510 Atherosclerotic heart disease of native coronary artery without angina pectoris: Secondary | ICD-10-CM

## 2016-06-16 DIAGNOSIS — J84112 Idiopathic pulmonary fibrosis: Secondary | ICD-10-CM | POA: Diagnosis not present

## 2016-06-16 DIAGNOSIS — I10 Essential (primary) hypertension: Secondary | ICD-10-CM | POA: Diagnosis not present

## 2016-06-16 NOTE — Patient Instructions (Signed)
Continue your current cardiac meds.  I will see you in 6 months 

## 2016-06-20 ENCOUNTER — Other Ambulatory Visit: Payer: Self-pay | Admitting: Cardiology

## 2016-06-24 ENCOUNTER — Encounter: Payer: Self-pay | Admitting: Pulmonary Disease

## 2016-06-24 ENCOUNTER — Ambulatory Visit (INDEPENDENT_AMBULATORY_CARE_PROVIDER_SITE_OTHER): Payer: Medicare Other | Admitting: Pulmonary Disease

## 2016-06-24 DIAGNOSIS — I2510 Atherosclerotic heart disease of native coronary artery without angina pectoris: Secondary | ICD-10-CM

## 2016-06-24 DIAGNOSIS — G4733 Obstructive sleep apnea (adult) (pediatric): Secondary | ICD-10-CM | POA: Diagnosis not present

## 2016-06-24 DIAGNOSIS — J84112 Idiopathic pulmonary fibrosis: Secondary | ICD-10-CM | POA: Diagnosis not present

## 2016-06-24 NOTE — Progress Notes (Signed)
   Subjective:    Patient ID: Jody Ruiz, female    DOB: 09/10/1940, 75 y.o.   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 Amil Amen 08/2014 felt that  ANA 1:320 was  false-positive, SSA was weakly positive, but SSB negative hence not Sjogren's   06/24/2016  Chief Complaint  Patient presents with  . Follow-up    1 month. Breathing has been ok for the past month.     She feels that her breathing has become slightly worse over the past 2 years Dry cough is persistent-she's not taken anything over-the-counter for this yet  She is more compliant with her CPAP and reports improvement in her daytime somnolence No problems with mask or pressure Download shows good control of events on auto CPAP average pressure of 11 cm  She had several questions today regarding her results of PFTs and scans which we discussed in detail   Significant tests/ events  Elevated rt hemi-D due to eventration, Neg sniff test , Neg PE on CT  Nml EF, grade 1 diastolic on echo  nml bnp  CT chest showed Subpleural fibrosis at the lung bases &sub -sm nodles Rt fissure, largest 42mm   PSG showed AHI 66/h with predominant hypopneas &nadir desatn of 79% corrected by CPAP 11 cm, with med full face mask.  Download 11/2012 on 12 cm showed residual 9/h, good compliance, small leak   For bloating ,changed to auto--   HRCT chest 05/2016 > Basilar predominant pattern of pulmonary fibrosis , lack of progression compared to 07/2013 PFTs  05/2016   show slight decrease in DLCO from 64% in 2016 to 50%, lung volumes are preserved    Review of Systems neg for any significant sore throat, dysphagia, itching, sneezing, nasal congestion or  excess/ purulent secretions, fever, chills, sweats, unintended wt loss, pleuritic or exertional cp, hempoptysis, orthopnea pnd or change in chronic leg swelling. Also denies presyncope, palpitations, heartburn, abdominal pain, nausea, vomiting, diarrhea or change in bowel or urinary habits, dysuria,hematuria, rash, arthralgias, visual complaints, headache, numbness weakness or ataxia.     Objective:   Physical Exam  Gen. Pleasant, obese, in no distress ENT - no lesions, no post nasal drip Neck: No JVD, no thyromegaly, no carotid bruits Lungs: no use of accessory muscles, no dullness to percussion, decreased without rales or rhonchi  Cardiovascular: Rhythm regular, heart sounds  normal, no murmurs or gallops, no peripheral edema Musculoskeletal: No deformities, no cyanosis or clubbing , no tremors       Assessment & Plan:

## 2016-06-24 NOTE — Assessment & Plan Note (Signed)
Weight loss encouraged, compliance with goal of at least 4-6 hrs every night is the expectation. Advised against medications with sedative side effects Cautioned against driving when sleepy - understanding that sleepiness will vary on a day to day basis  

## 2016-06-24 NOTE — Assessment & Plan Note (Signed)
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

## 2016-06-24 NOTE — Patient Instructions (Signed)
We discussed scan and PFT results CPAP of 11 cm seems to be working well  Good luck with your visit to [facility] tomorrow Take copies of your scans on a disc

## 2016-06-25 DIAGNOSIS — R0600 Dyspnea, unspecified: Secondary | ICD-10-CM | POA: Diagnosis not present

## 2016-06-25 DIAGNOSIS — R0689 Other abnormalities of breathing: Secondary | ICD-10-CM | POA: Diagnosis not present

## 2016-06-25 DIAGNOSIS — J84111 Idiopathic interstitial pneumonia, not otherwise specified: Secondary | ICD-10-CM | POA: Diagnosis not present

## 2016-07-03 ENCOUNTER — Encounter: Payer: Self-pay | Admitting: Pulmonary Disease

## 2016-07-05 DIAGNOSIS — J018 Other acute sinusitis: Secondary | ICD-10-CM | POA: Diagnosis not present

## 2016-07-05 DIAGNOSIS — H60502 Unspecified acute noninfective otitis externa, left ear: Secondary | ICD-10-CM | POA: Diagnosis not present

## 2016-07-05 DIAGNOSIS — H66002 Acute suppurative otitis media without spontaneous rupture of ear drum, left ear: Secondary | ICD-10-CM | POA: Diagnosis not present

## 2016-07-16 ENCOUNTER — Encounter: Payer: Self-pay | Admitting: Pulmonary Disease

## 2016-07-16 ENCOUNTER — Ambulatory Visit (INDEPENDENT_AMBULATORY_CARE_PROVIDER_SITE_OTHER): Payer: Medicare Other | Admitting: Pulmonary Disease

## 2016-07-16 VITALS — BP 116/72 | HR 64 | Ht 66.5 in | Wt 209.2 lb

## 2016-07-16 DIAGNOSIS — R1310 Dysphagia, unspecified: Secondary | ICD-10-CM | POA: Diagnosis not present

## 2016-07-16 DIAGNOSIS — G4733 Obstructive sleep apnea (adult) (pediatric): Secondary | ICD-10-CM | POA: Diagnosis not present

## 2016-07-16 DIAGNOSIS — J84112 Idiopathic pulmonary fibrosis: Secondary | ICD-10-CM

## 2016-07-16 NOTE — Patient Instructions (Signed)
Swallowing test with therapist -Based on results we will ask Dr. Henrene Pastor to investigate further  We will hold off on anti-fibrotic medication for a year unless there is signs of worsening fibrosis in the lungs

## 2016-07-16 NOTE — Assessment & Plan Note (Signed)
Get back on CPAP machine Average pressure 11 cm seems to be adequate with good control of events and symptoms

## 2016-07-16 NOTE — Assessment & Plan Note (Signed)
Swallowing test with therapist -Based on results we will ask Dr. Henrene Pastor to investigate further  We will hold off on anti-fibrotic medication for a year unless there is signs of worsening fibrosis in the lungs

## 2016-07-16 NOTE — Progress Notes (Signed)
   Subjective:    Patient ID: Jody Ruiz, female    DOB: [birth year], 76 y.o.   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 Amil Amen 08/2014 felt that ANA 1:320 was false-positive, SSA was weakly positive, but SSB negative hence not Sjogren's   07/16/2016  Chief Complaint  Patient presents with  . Follow-up    Was sent to Dr. Randol Kern at [facility] for a second opinion. Wants to see lab results from Dr. Randol Kern. Breathing was worse around the holidays due to multiple illness. Breathing is now better. Unable to wear CPAP due to illnesses.     Saw Dr. Randol Kern at [facility] a workup for aspiration, if this workup is negative then consider empiric anti-fibrotic treatment especially perfenidone ANA 1: 75 , speckled Reviewed these notes in detail  She developed a sinus infection and received 2 rounds of antibiotics-surprisingly feels that her breathing is also improved with this She was very compliant with CPAP more than 6 hours per night which is confirmed on download but last couple of weeks due to her sinus infection she was not been using her machine    Significant tests/ events  Elevated rt hemi-D due to eventration, Neg sniff test , Neg PE on CT  Nml EF, grade 1 diastolic on echo  nml bnp  CT chest showed Subpleural fibrosis at the lung bases &sub -sm nodles Rt fissure, largest 102mm   PSG showed AHI 66/h with predominant hypopneas &nadir desatn of 79% corrected by CPAP 11 cm, with med full face mask.  Download 11/2012 on 12 cm showed residual 9/h, good compliance, small leak   For bloating ,changed to auto--   HRCT chest 05/2016 >Basilar predominant pattern of pulmonary fibrosis ,lack of  progression compared to 07/2013 PFTs 05/2016   show slight decrease in DLCO from 64% in 2016 to 50%,lung volumes are preserved    Review of Systems neg for any significant sore throat, dysphagia, itching, sneezing, nasal congestion or excess/ purulent secretions, fever, chills, sweats, unintended wt loss, pleuritic or exertional cp, hempoptysis, orthopnea pnd or change in chronic leg swelling. Also denies presyncope, palpitations, heartburn, abdominal pain, nausea, vomiting, diarrhea or change in bowel or urinary habits, dysuria,hematuria, rash, arthralgias, visual complaints, headache, numbness weakness or ataxia.     Objective:   Physical Exam  Gen. Pleasant, obese, in no distress ENT - no lesions, no post nasal drip Neck: No JVD, no thyromegaly, no carotid bruits Lungs: no use of accessory muscles, no dullness to percussion, RLL rales, no rhonchi  Cardiovascular: Rhythm regular, heart sounds  normal, no murmurs or gallops, no peripheral edema Musculoskeletal: No deformities, no cyanosis or clubbing , no tremors        Assessment & Plan:

## 2016-07-17 ENCOUNTER — Other Ambulatory Visit (HOSPITAL_COMMUNITY): Payer: Self-pay | Admitting: Pulmonary Disease

## 2016-07-17 DIAGNOSIS — R1310 Dysphagia, unspecified: Secondary | ICD-10-CM

## 2016-07-17 NOTE — Addendum Note (Signed)
Addended by: Nimisha Rathel M on: 07/17/2016 12:24 PM   Modules accepted: Orders  

## 2016-07-21 DIAGNOSIS — Z803 Family history of malignant neoplasm of breast: Secondary | ICD-10-CM | POA: Diagnosis not present

## 2016-07-21 DIAGNOSIS — Z1231 Encounter for screening mammogram for malignant neoplasm of breast: Secondary | ICD-10-CM | POA: Diagnosis not present

## 2016-07-28 ENCOUNTER — Ambulatory Visit (HOSPITAL_COMMUNITY)
Admission: RE | Admit: 2016-07-28 | Discharge: 2016-07-28 | Disposition: A | Discharge status: Home / Self Care | Payer: Medicare Other | Source: Ambulatory Visit | Attending: Pulmonary Disease | Admitting: Pulmonary Disease

## 2016-07-28 DIAGNOSIS — Z955 Presence of coronary angioplasty implant and graft: Secondary | ICD-10-CM | POA: Insufficient documentation

## 2016-07-28 DIAGNOSIS — K648 Other hemorrhoids: Secondary | ICD-10-CM | POA: Insufficient documentation

## 2016-07-28 DIAGNOSIS — R1310 Dysphagia, unspecified: Secondary | ICD-10-CM | POA: Diagnosis not present

## 2016-07-28 DIAGNOSIS — I252 Old myocardial infarction: Secondary | ICD-10-CM | POA: Diagnosis not present

## 2016-07-28 DIAGNOSIS — M1990 Unspecified osteoarthritis, unspecified site: Secondary | ICD-10-CM | POA: Diagnosis not present

## 2016-07-28 DIAGNOSIS — K219 Gastro-esophageal reflux disease without esophagitis: Secondary | ICD-10-CM | POA: Insufficient documentation

## 2016-07-28 DIAGNOSIS — G4733 Obstructive sleep apnea (adult) (pediatric): Secondary | ICD-10-CM | POA: Insufficient documentation

## 2016-07-28 DIAGNOSIS — R918 Other nonspecific abnormal finding of lung field: Secondary | ICD-10-CM | POA: Insufficient documentation

## 2016-07-28 DIAGNOSIS — J84112 Idiopathic pulmonary fibrosis: Secondary | ICD-10-CM | POA: Diagnosis not present

## 2016-07-28 DIAGNOSIS — E669 Obesity, unspecified: Secondary | ICD-10-CM | POA: Insufficient documentation

## 2016-07-28 DIAGNOSIS — E039 Hypothyroidism, unspecified: Secondary | ICD-10-CM | POA: Insufficient documentation

## 2016-07-28 DIAGNOSIS — E785 Hyperlipidemia, unspecified: Secondary | ICD-10-CM | POA: Diagnosis not present

## 2016-07-28 DIAGNOSIS — R633 Feeding difficulties: Secondary | ICD-10-CM | POA: Diagnosis not present

## 2016-07-28 DIAGNOSIS — I2510 Atherosclerotic heart disease of native coronary artery without angina pectoris: Secondary | ICD-10-CM | POA: Insufficient documentation

## 2016-07-28 DIAGNOSIS — I10 Essential (primary) hypertension: Secondary | ICD-10-CM | POA: Insufficient documentation

## 2016-08-04 ENCOUNTER — Encounter: Payer: Self-pay | Admitting: Physician Assistant

## 2016-08-04 ENCOUNTER — Ambulatory Visit (INDEPENDENT_AMBULATORY_CARE_PROVIDER_SITE_OTHER): Payer: Medicare Other | Admitting: Physician Assistant

## 2016-08-04 VITALS — BP 106/62 | HR 68 | Ht 66.5 in | Wt 203.0 lb

## 2016-08-04 DIAGNOSIS — R634 Abnormal weight loss: Secondary | ICD-10-CM

## 2016-08-04 DIAGNOSIS — K52839 Microscopic colitis, unspecified: Secondary | ICD-10-CM | POA: Diagnosis not present

## 2016-08-04 DIAGNOSIS — R197 Diarrhea, unspecified: Secondary | ICD-10-CM | POA: Diagnosis not present

## 2016-08-04 MED ORDER — DICYCLOMINE HCL 10 MG PO CAPS: 10.0000 mg | ORAL_CAPSULE | Freq: Three times a day (TID) | ORAL | 3 refills | Status: DC

## 2016-08-04 MED ORDER — SACCHAROMYCES BOULARDII 250 MG PO CAPS: 250.0000 mg | ORAL_CAPSULE | Freq: Two times a day (BID) | ORAL | 0 refills | Status: DC

## 2016-08-04 NOTE — Progress Notes (Signed)
Subjective:    Patient ID: Jody Ruiz, female    DOB: Aug 26, 1940, 76 y.o.   MRN: [id number]  HPI Jody Ruiz is a pleasant 76 year old white female known to Dr. Henrene Pastor who has history of microscopic colitis, which had been diagnosed by Dr. Amedeo Plenty several years ago. Patient also has hypertension, obesity, coronary artery disease status post MI and stent, history of adenomatous colon polyps, sleep apnea and pulmonary fibrosis. She was recently seen in the office in November 2017 at that time with 1 week history of diarrhea and abdominal cramping and urgency. She was having 2-6 bowel movements per day. It was opted to give her an empiric course of metronidazole 250 mg by mouth 4 times a day 10 days. Patient says that the true Neri is all did help and her symptoms resolved but then recurred again in December. Around that same time she was also being treated for a sinus infection and ear infection and had been given 2 courses of antibiotics. Exline She self treated with a second course of metronidazole and again says her symptoms resolved but then recurred a few weeks later. She says each time the symptoms come back they're worse than the last time. She is now having 5-10 bowel movements per day of all loose to liquid stool, no bleeding. She is having to get up at times at night with diarrhea. Decreased and she feels that eating aggravates her symptoms. Of lower abdominal cramping and discomfort and postprandial urgency. Her weight is down about 10 pounds over the past month. No documented fever or chills. Last colonoscopy had been done November 2016 with finding of multiple tubular adenomas and one sessile serrated polyp.  Review of Systems Pertinent positive and negative review of systems were noted in the above HPI section.  All other review of systems was otherwise negative.  Outpatient Encounter Prescriptions as of 08/04/2016  Medication Sig  . amLODipine (NORVASC) 5 MG tablet TAKE 1 TABLET DAILY   . aspirin EC 81 MG tablet Take 81 mg by mouth daily.  . Cholecalciferol (VITAMIN D) 2000 units CAPS Take 2,000 Units by mouth daily.  . isosorbide mononitrate (IMDUR) 60 MG 24 hr tablet TAKE ONE TABLET AT BEDTIME  . metoprolol succinate (TOPROL XL) 50 MG 24 hr tablet Take 1 tablet (50 mg total) by mouth daily. Take with or immediately following a meal.  . nitroGLYCERIN (NITROSTAT) 0.4 MG SL tablet Place 1 tablet (0.4 mg total) under the tongue every 5 (five) minutes x 3 doses as needed for chest pain.  . polyvinyl alcohol (LIQUIFILM TEARS) 1.4 % ophthalmic solution Place 1 drop into both eyes as needed. For dry eyes  . dicyclomine (BENTYL) 10 MG capsule Take 1 capsule (10 mg total) by mouth 4 (four) times daily -  before meals and at bedtime.  . saccharomyces boulardii (FLORASTOR) 250 MG capsule Take 1 capsule (250 mg total) by mouth 2 (two) times daily.   No facility-administered encounter medications on file as of 08/04/2016.    Allergies  Allergen Reactions  . Brilinta [Ticagrelor] Shortness Of Breath and Cough    fatigue  . Lisinopril Shortness Of Breath and Cough  . Statins     Muscle Weakness  . Zetia [Ezetimibe]     Myalgias   . Macrodantin [Nitrofurantoin Macrocrystal] Rash  . Sulfa Antibiotics Rash   Patient Active Problem List   Diagnosis Date Noted  . Altered mental status   . CAD (coronary artery disease)   . Interstitial lung  disease ([place])   . HLD (hyperlipidemia)   . Precordial pain 07/17/2013  . Midsternal chest pain 07/17/2013  . Pulmonary nodule 11/22/2012  . 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 OSA (obstructive sleep apnea) 10/05/2012  . IPF (idiopathic pulmonary fibrosis) ([place]) 08/26/2012  . Coronary atherosclerosis of native coronary artery 05/04/2012  . Hypertension   . Obesity    Social History   Social History  . Marital status: Married    Spouse name: N/A  . Number of children: 3  . Years of education: N/A   Occupational History  .  Retired Therapist, sports Retired   Social History Main Topics  . Smoking status: Never Smoker  . Smokeless tobacco: Never Used  . Alcohol use No  . Drug use: No  . Sexual activity: Not on file   Other Topics Concern  . Not on file   Social History Narrative  . No narrative on file    Jody Ruiz's family history includes Breast cancer in her sister; Colon cancer (age of onset: 63) in her sister; Colon polyps in her father; Heart disease (age of onset: 40) in her brother; Heart disease (age of onset: 80) in her mother; Heart disease (age of onset: 31) in her father; Kidney disease in her father; Prostate cancer in her father.      Objective:    Vitals:   08/04/16 0826  BP: 106/62  Pulse: 68    Physical Exam well-developed older white female in no acute distress, fatigued-appearing, pleasant blood pressure 106/62 pulse 68, height 5 foot 6 weight 203 BMI of 32.2. HEENT; nontraumatic normocephalic EOMI PERRLA sclera anicteric, Neck ;supple no JVD, Cardiovascular ;regular rate and rhythm with S1-S2 no murmur or gallop, Pulmonary; clear bilaterally, Abdomen; soft, bowel sounds are present there some very mild erythema in the umbilicus, No palpable mass or hepatosplenomegaly is mouth generalized tenderness in the lower abdomen no guarding or rebound, Rectal ;exam not done, Ext; is no clubbing cyanosis or edema skin warm dry, Neuropsych; mood and affect appropriate       Assessment & Plan:   #77 76 year old white female with relapsing diarrhea over the past 3 months. Patient has prior history of microscopic colitis. She has responded to empiric courses of metronidazole but then diarrhea recurs. Unclear at this time with her diarrhea is secondary to microscopic colitis or possibly superimposed C. Difficile. #2 multiple tubular adenomatous polyps and sessile serrated adenoma at last colonoscopy November 2016-indicated for 3 year interval follow-up  Plan; we'll check GI pathogen panel Start Florastor one  by mouth twice a day Start Bentyl 10 mg by mouth 3 times a day one half hour before meals and then at bedtime Will need vancomycin if C. difficile positive, if C. difficile negative will plan to start a course of budesonide. Low residue diet and push fluids.  Amy Genia Harold PA-C 08/04/2016   Cc: Burnard Bunting, MD

## 2016-08-04 NOTE — Patient Instructions (Addendum)
Please go to the basement level to have our lab for stool study.   We sent prescriptions to [facility]. 1. Bentyl 10 mg 2. Florastor probiotic

## 2016-08-04 NOTE — Progress Notes (Signed)
Initial assessment and plans reviewed 

## 2016-08-06 ENCOUNTER — Other Ambulatory Visit: Payer: Medicare Other

## 2016-08-06 DIAGNOSIS — R634 Abnormal weight loss: Secondary | ICD-10-CM

## 2016-08-06 DIAGNOSIS — K52839 Microscopic colitis, unspecified: Secondary | ICD-10-CM

## 2016-08-06 DIAGNOSIS — R197 Diarrhea, unspecified: Secondary | ICD-10-CM

## 2016-08-07 LAB — GASTROINTESTINAL PATHOGEN PANEL PCR
C. DIFFICILE TOX A/B, PCR: NOT DETECTED
Campylobacter, PCR: NOT DETECTED
Cryptosporidium, PCR: NOT DETECTED
E COLI (STEC) STX1/STX2, PCR: NOT DETECTED
E coli (ETEC) LT/ST PCR: NOT DETECTED
E coli 0157, PCR: NOT DETECTED
Giardia lamblia, PCR: NOT DETECTED
Norovirus, PCR: NOT DETECTED
ROTAVIRUS, PCR: NOT DETECTED
SALMONELLA, PCR: NOT DETECTED
SHIGELLA, PCR: NOT DETECTED

## 2016-08-08 ENCOUNTER — Other Ambulatory Visit: Payer: Self-pay

## 2016-08-08 MED ORDER — BUDESONIDE 9 MG PO TB24: 9.0000 mg | ORAL_TABLET | Freq: Every day | ORAL | 2 refills | Status: DC

## 2016-08-19 ENCOUNTER — Telehealth: Payer: Self-pay | Admitting: Physician Assistant

## 2016-08-19 NOTE — Telephone Encounter (Signed)
I called the patient. She is running out of the samples we gave her. Barb Merino RN sent a prescription for Uceris but the patient cannot afford it.  She wants to know if we can prescribe something else. She hates to have her therapy interrupted.

## 2016-08-19 NOTE — Telephone Encounter (Signed)
I have enough samples for almost 3 more weeks worth.. Lets try to call in generic budesonide for her also 9 mg per day (may be 3 mg tabs ) needs 8 weeks total

## 2016-08-20 ENCOUNTER — Other Ambulatory Visit: Payer: Self-pay | Admitting: *Deleted

## 2016-08-20 MED ORDER — BUDESONIDE 3 MG PO CPEP: ORAL_CAPSULE | ORAL | 1 refills | Status: DC

## 2016-08-20 NOTE — Telephone Encounter (Signed)
Called the pharmacy to ask how much the patient has to pay for the script I sent electronically today 08-19-2016.  Entocort 3 mg, take 3 tablets daily. # 90 with 1 refill.  I was told with her insurance she will have to pay approximately $400.00.  I had the pharmacist double check and she said that is correct.  I did tell the patient ai have samples of Uceris for 18 days for her to pick up.

## 2016-08-20 NOTE — Telephone Encounter (Signed)
Called the patient to advise the Entocort 3 mg will be $ 400.00 a month with her insurance.  Per Nicoletta Ba PA, We will try to get more samples.  The patient did pick up the 18 days samples from today I put out front for her.

## 2016-08-20 NOTE — Telephone Encounter (Signed)
Ok ..willwork on getting her more samples- she may need to call back next week

## 2016-08-26 ENCOUNTER — Telehealth: Payer: Self-pay | Admitting: Physician Assistant

## 2016-08-26 NOTE — Telephone Encounter (Signed)
Completed her 2 weeks of Bentyl. Has been on Florastor for a month now. Uceris is controlling the bowel symptoms. New onset of upper GI burning from "esophagus into the stomach" and bloating. She says she cannot tolerate this for long. Asks for other treatment options.

## 2016-08-27 DIAGNOSIS — H2513 Age-related nuclear cataract, bilateral: Secondary | ICD-10-CM | POA: Diagnosis not present

## 2016-08-27 DIAGNOSIS — H35033 Hypertensive retinopathy, bilateral: Secondary | ICD-10-CM | POA: Diagnosis not present

## 2016-08-27 DIAGNOSIS — H25013 Cortical age-related cataract, bilateral: Secondary | ICD-10-CM | POA: Diagnosis not present

## 2016-08-27 DIAGNOSIS — H3509 Other intraretinal microvascular abnormalities: Secondary | ICD-10-CM | POA: Diagnosis not present

## 2016-08-27 NOTE — Telephone Encounter (Signed)
She can stop florastor when she finishes what she has . Please send Rx for pepcid 40 mg po twice daily x one month for what sounds like esophagitis sxs- take for at least 2 weeks, then as  needed

## 2016-08-27 NOTE — Telephone Encounter (Signed)
Patient is not at home. Spouse asked that I call back in the afternoon.

## 2016-08-27 NOTE — Telephone Encounter (Signed)
Patient agrees to this plan of care. She will take Pepcid 40 mg BID. Rx called to her pharmacy. Discussed Uceris. She has a 50/50 co-pay for the prescription which puts her costs over $900 for a 30 day supply. This is not something she can afford. The plan will be to take her as far as we can with samples. When this is no longer an option, she is willing to change to a compound form available through [facility] [phone]. It will be the Budesonide 5 mg DR capsules #60 with the SIG of take 2 daily. She will need to be on budesonide for another 8 weeks.  Instructions are per Nicoletta Ba, PA-C

## 2016-09-17 ENCOUNTER — Telehealth: Payer: Self-pay | Admitting: Physician Assistant

## 2016-09-17 NOTE — Telephone Encounter (Signed)
We have reached out to the rep for Uceris to get samples.  Aaliyana will be out Friday.  I've updated her medicine list.  Entocort that was on it has been removed as she never picked it up.

## 2016-09-19 NOTE — Telephone Encounter (Signed)
We reached the uceris rep and he is coming with samples this afternoon.  Tamira has been informed and will come late afternoon to pick up.

## 2016-09-22 ENCOUNTER — Encounter: Payer: Self-pay | Admitting: Pulmonary Disease

## 2016-09-22 ENCOUNTER — Ambulatory Visit (INDEPENDENT_AMBULATORY_CARE_PROVIDER_SITE_OTHER): Payer: Medicare Other | Admitting: Pulmonary Disease

## 2016-09-22 DIAGNOSIS — J84112 Idiopathic pulmonary fibrosis: Secondary | ICD-10-CM | POA: Diagnosis not present

## 2016-09-22 DIAGNOSIS — G4733 Obstructive sleep apnea (adult) (pediatric): Secondary | ICD-10-CM | POA: Diagnosis not present

## 2016-09-22 NOTE — Patient Instructions (Signed)
Ambulatory saturation check  We discussed anti-fibrotic medication- Perfenidone  and GI side effects  Check liver function test if you have blood work done between now and next visit Call us once your abdominal symptoms are controlled

## 2016-09-22 NOTE — Assessment & Plan Note (Signed)
Weight loss encouraged, compliance with goal of at least 4-6 hrs every night is the expectation. Advised against medications with sedative side effects Cautioned against driving when sleepy - understanding that sleepiness will vary on a day to day basis  

## 2016-09-22 NOTE — Telephone Encounter (Signed)
Nieve came by Friday and picked up #12 uceris samples we had her.  I have spoken to [facility], LPN this AM to update her on this patient and Eustaquio Maize is going to see if [place] wants to get the compounded rx since it is going to be hard to get more samples.

## 2016-09-22 NOTE — Progress Notes (Signed)
   Subjective:    Patient ID: Jody Ruiz, female    DOB: 26-Nov-1940, 76 y.o.   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 Amil Amen 08/2014 felt that ANA 1:320 was false-positive, SSA was weakly positive, but SSB negative hence not Sjogren's 06/2016 Saw Dr. Randol Kern at [facility] >>consider empiric anti-fibrotic treatment especially perfenidone ANA 1: 640 , speckled  09/22/2016  Chief Complaint  Patient presents with  . Follow-up    3 month follow.      Breathing has been stable but she has been struggling with a lot of GI symptoms. She developed diarrhea and was placed on budesonide which is very expensive, difficult to obtain she's been getting samples-but with this medication she developed abdominal bloating-she is not sure that she'll be able to tolerate the 90 day course  Her breathing meantime has been stable, occasional dry cough no wheezing. 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  >>>For bloating ,changed to auto--settings   HRCT chest 05/2016>Basilar predominant pattern of pulmonary fibrosis ,lack of progression compared to 07/2013 PFTs 05/2016 show slight decrease in DLCO from 64% in 2016 to 50%,lung volumes are preserved  07/2016 swallow no aspiration   Review of Systems neg  for any significant sore throat, dysphagia, itching, sneezing, nasal congestion or excess/ purulent secretions, fever, chills, sweats, unintended wt loss, pleuritic or exertional cp, hempoptysis, orthopnea pnd or change in chronic leg swelling. Also denies presyncope, palpitations, heartburn, abdominal pain, nausea, vomiting, diarrhea or change in bowel or urinary habits, dysuria,hematuria, rash, arthralgias, visual complaints, headache, numbness weakness or ataxia.     Objective:   Physical Exam   Gen. Pleasant, obese, in no distress ENT - no lesions, no post nasal drip Neck: No JVD, no thyromegaly, no carotid bruits Lungs: no use of accessory muscles, no dullness to percussion, bibasal rales, no rhonchi  Cardiovascular: Rhythm regular, heart sounds  normal, no murmurs or gallops, no peripheral edema Musculoskeletal: No deformities, no cyanosis or clubbing , no tremors        Assessment & Plan:

## 2016-09-22 NOTE — Assessment & Plan Note (Signed)
Ambulatory saturation check  We discussed anti-fibrotic medication- Perfenidone  and GI side effects  Check liver function test if you have blood work done between now and next visit Call us once your abdominal symptoms are controlled and we can consider starting

## 2016-09-25 ENCOUNTER — Other Ambulatory Visit: Payer: Self-pay

## 2016-09-25 MED ORDER — NONFORMULARY OR COMPOUNDED ITEM: 0 refills | Status: DC

## 2016-09-25 NOTE — Telephone Encounter (Signed)
Called to her home. Mr Walrond answers. He asks for a call back in the afternoon. Patient is not home. Rx printed for Budesonide 5 mg DR capsules to send to [facility] to be compounded. They will hold the Rx until the patient calls and confirms she wants the Rx and sets up her payment.

## 2016-09-30 ENCOUNTER — Telehealth: Payer: Self-pay | Admitting: Physician Assistant

## 2016-09-30 NOTE — Telephone Encounter (Signed)
Patient wants to see Amy Esterwood PA-C or another GI provider before she continues the Uceris. She is having nausea and blames Uceris. She is refusing to take it. Appointment for 10/02/16 scheduled. Patient is also having diarrhea since Sunday.

## 2016-09-30 NOTE — Telephone Encounter (Signed)
Appointment scheduled for the patient after speaking with her. See previous telephone note.

## 2016-10-01 NOTE — Telephone Encounter (Signed)
Patient is advised. She wants to keep her appointment tomorrow. She is "living on milk and liquids".

## 2016-10-01 NOTE — Telephone Encounter (Signed)
That's fine-she can stop the Uceris- can use imodium up to 8 per days or pepto bismol as need for diarrhea

## 2016-10-02 ENCOUNTER — Other Ambulatory Visit (INDEPENDENT_AMBULATORY_CARE_PROVIDER_SITE_OTHER): Payer: Medicare Other

## 2016-10-02 ENCOUNTER — Encounter: Payer: Self-pay | Admitting: Physician Assistant

## 2016-10-02 ENCOUNTER — Ambulatory Visit (INDEPENDENT_AMBULATORY_CARE_PROVIDER_SITE_OTHER): Payer: Medicare Other | Admitting: Physician Assistant

## 2016-10-02 VITALS — BP 130/70 | HR 72 | Ht 66.5 in | Wt 201.4 lb

## 2016-10-02 DIAGNOSIS — R109 Unspecified abdominal pain: Secondary | ICD-10-CM

## 2016-10-02 DIAGNOSIS — R110 Nausea: Secondary | ICD-10-CM

## 2016-10-02 DIAGNOSIS — K52832 Lymphocytic colitis: Secondary | ICD-10-CM

## 2016-10-02 DIAGNOSIS — K52839 Microscopic colitis, unspecified: Secondary | ICD-10-CM | POA: Diagnosis not present

## 2016-10-02 DIAGNOSIS — Z86010 Personal history of colonic polyps: Secondary | ICD-10-CM | POA: Diagnosis not present

## 2016-10-02 LAB — COMPREHENSIVE METABOLIC PANEL
ALK PHOS: 67 U/L (ref 39–117)
ALT: 13 U/L (ref 0–35)
AST: 15 U/L (ref 0–37)
Albumin: 3.9 g/dL (ref 3.5–5.2)
BILIRUBIN TOTAL: 0.5 mg/dL (ref 0.2–1.2)
BUN: 23 mg/dL (ref 6–23)
CALCIUM: 9.5 mg/dL (ref 8.4–10.5)
CO2: 30 meq/L (ref 19–32)
Chloride: 103 mEq/L (ref 96–112)
Creatinine, Ser: 1.52 mg/dL — ABNORMAL HIGH (ref 0.40–1.20)
GFR: 35.54 mL/min — ABNORMAL LOW (ref >60.00)
GLUCOSE: 104 mg/dL — AB (ref 70–99)
POTASSIUM: 3.7 meq/L (ref 3.5–5.1)
Sodium: 140 mEq/L (ref 135–145)
TOTAL PROTEIN: 7.5 g/dL (ref 6.0–8.3)

## 2016-10-02 LAB — CBC WITH DIFFERENTIAL/PLATELET
Basophils Absolute: 0 10*3/uL (ref 0.0–0.1)
Basophils Relative: 0.5 % (ref 0.0–3.0)
Eosinophils Absolute: 0.2 10*3/uL (ref 0.0–0.7)
Eosinophils Relative: 2 % (ref 0.0–5.0)
HEMATOCRIT: 36.2 % (ref 36.0–46.0)
Hemoglobin: 12.4 g/dL (ref 12.0–15.0)
LYMPHS ABS: 3.1 10*3/uL (ref 0.7–4.0)
LYMPHS PCT: 30.2 % (ref 12.0–46.0)
MCHC: 34.2 g/dL (ref 30.0–36.0)
MCV: 101.5 fl — AB (ref 78.0–100.0)
MONOS PCT: 7.1 % (ref 3.0–12.0)
Monocytes Absolute: 0.7 10*3/uL (ref 0.1–1.0)
NEUTROS ABS: 6.2 10*3/uL (ref 1.4–7.7)
NEUTROS PCT: 60.2 % (ref 43.0–77.0)
PLATELETS: 376 10*3/uL (ref 150.0–400.0)
RBC: 3.57 Mil/uL — ABNORMAL LOW (ref 3.87–5.11)
RDW: 14.1 % (ref 11.5–15.5)
WBC: 10.2 10*3/uL (ref 4.0–10.5)

## 2016-10-02 LAB — C-REACTIVE PROTEIN: CRP: 0.7 mg/dL (ref 0.5–20.0)

## 2016-10-02 LAB — SEDIMENTATION RATE: Sed Rate: 40 mm/hr — ABNORMAL HIGH (ref 0–30)

## 2016-10-02 NOTE — Patient Instructions (Signed)
Your physician has requested that you go to the basement for lab work before leaving today.  Take your Pepcid 40 mg every morning and Bentyl 10 mg every 6 hours as needed for cramping and diarrhea.   Start pepto-bismol 3 capsules by mouth three times daily.

## 2016-10-02 NOTE — Progress Notes (Signed)
Subjective:    Patient ID: PAITEN BOIES, female    DOB: [birth year], 76 y.o.   MRN: [id number]  HPI Blessing is a 76 year old female known to Dr. Henrene Pastor and myself was being followed for microscopic colitis. She has history of hypertension, coronary artery disease is status post MI and stents, obstructive sleep apnea and idiopathic pulmonary fibrosis. She was diagnosed with lymphocytic colitis several years ago on colonoscopy per Dr. Amedeo Plenty. Her last colonoscopy done in November 2016 per Dr. Henrene Pastor with finding of multiple tubular adenomas and one sessile serrated polyp she was recommended for 3 year interval follow-up. Patient began with exacerbation of colitis type symptoms in the late fall after she had taken antibiotics. She was initially treated with a course of Flagyl with some improvement in symptoms relapsed. She has had GI pathogen panel done which was negative. She was seen back in the office at that time started her on a course of budesonide 9 mg by mouth daily. We had issues with insurance cover coverage etc., eventually she was given generic budesonide 9 mg by mouth daily. She was able to tolerate this for about 6 weeks and says that it did seem to help the diarrhea she was feeling a little better than she began propping problems with bloating and abdominal discomfort and GERD type symptoms with belching and burping and some burning. We started Pepcid short-term 40 mg by mouth twice a day. That did not not seem to help and she stopped all of the GI meds about a week ago. She comes in today stating that she's having about 5 bowel movements per day which is a significant improvement over the 10 she had initially. She says her stools are not necessarily diarrhea but she will have significant urgency and explosive bowel movements postprandially. She has developed nausea ,decrease in appetite, and some abdominal discomfort. No fever or chills, no bleeding.  Review of Systems Pertinent positive  and negative review of systems were noted in the above HPI section.  All other review of systems was otherwise negative.  Outpatient Encounter Prescriptions as of 10/02/2016  Medication Sig  . amLODipine (NORVASC) 5 MG tablet TAKE 1 TABLET DAILY  . aspirin EC 81 MG tablet Take 81 mg by mouth daily.  . isosorbide mononitrate (IMDUR) 60 MG 24 hr tablet TAKE ONE TABLET AT BEDTIME  . metoprolol succinate (TOPROL XL) 50 MG 24 hr tablet Take 1 tablet (50 mg total) by mouth daily. Take with or immediately following a meal.  . nitroGLYCERIN (NITROSTAT) 0.4 MG SL tablet Place 1 tablet (0.4 mg total) under the tongue every 5 (five) minutes x 3 doses as needed for chest pain.  . NONFORMULARY OR COMPOUNDED ITEM Budesonide 5 mg DR capsules SIG: take 2 capsules daily  . polyvinyl alcohol (LIQUIFILM TEARS) 1.4 % ophthalmic solution Place 1 drop into both eyes as needed. For dry eyes  . [DISCONTINUED] Budesonide (UCERIS) 9 MG TB24 Take 9 mg by mouth daily.  . [DISCONTINUED] famotidine (PEPCID) 40 MG tablet Take 40 mg by mouth 2 (two) times daily.   No facility-administered encounter medications on file as of 10/02/2016.    Allergies  Allergen Reactions  . Brilinta [Ticagrelor] Shortness Of Breath and Cough    fatigue  . Lisinopril Shortness Of Breath and Cough  . Statins     Muscle Weakness  . Zetia [Ezetimibe]     Myalgias   . Macrodantin [Nitrofurantoin Macrocrystal] Rash  . Sulfa Antibiotics Rash   Patient Active  Problem List   Diagnosis Date Noted  . Altered mental status   . CAD (coronary artery disease)   . Interstitial lung disease ([place])   . HLD (hyperlipidemia)   . Precordial pain 07/17/2013  . Midsternal chest pain 07/17/2013  . Pulmonary nodule 11/22/2012  . 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 OSA (obstructive sleep apnea) 10/05/2012  . IPF (idiopathic pulmonary fibrosis) ([place]) 08/26/2012  . Coronary atherosclerosis of native coronary artery 05/04/2012  . Hypertension   .  Obesity    Social History   Social History  . Marital status: Married    Spouse name: N/A  . Number of children: 3  . Years of education: N/A   Occupational History  . Retired Therapist, sports Retired   Social History Main Topics  . Smoking status: Never Smoker  . Smokeless tobacco: Never Used  . Alcohol use No  . Drug use: No  . Sexual activity: Not on file   Other Topics Concern  . Not on file   Social History Narrative  . No narrative on file    Ms. Berenson's family history includes Breast cancer in her sister; Colon cancer (age of onset: 61) in her sister; Colon polyps in her father; Heart disease (age of onset: 14) in her brother; Heart disease (age of onset: 15) in her mother; Heart disease (age of onset: 67) in her father; Kidney disease in her father; Prostate cancer in her father.      Objective:    Vitals:   10/02/16 1500  BP: 130/70  Pulse: 72    Physical Exam  well-developed older white female in no acute distress, pale and fatigued-looking blood pressure 130/70 pulse 72, height 5 foot 6, weight 201, BMI 32. HEENT ;nontraumatic normocephalic EOMI PERRLA sclera anicteric, Cardiovascular; regular rate and rhythm with S1-S2 no murmur or gallop, Pulmonary; clear bilaterally, Abdomen; soft she has some mild rather generalized tenderness no guarding or rebound no palpable mass or hepatosplenomegaly bowel sounds are present, Rectal ;exam not done, Extremities; no clubbing cyanosis or edema skin warm and dry, Neuropsych ;mood and affect appropriate, affect flat       Assessment & Plan:   #3 76 year old white female with lymphocytic colitis with exacerbation over the past 3 months. She had some initial response to budesonide however over the past couple of weeks had developed nausea abdominal discomfort heartburn and indigestion. She has stopped the budesonide and Pepcid. I think symptoms are secondary to side effects of Budesonide Underlying issue remains exacerbation of  lymphocytic colitis  #2 idiopathic pulmonary fibrosis #3 obstructive sleep apnea #4 coronary artery disease #5 history of multiple adenomatous colon polyps-will be due for follow-up fall 2019  Plan; long discussion with patient. She will continue a bland diet and work on pushing her fluids. Check CBC, see met sedimentation rate and CRP We'll start a course of Pepto-Bismol 3 tablets by mouth 3 times a day for 6-8 weeks  Restart Pepcid 40 mg once daily short-term hopefully can get her off in a week or 2 (trying to avoid PPIs in setting of microscopic colitis) Resume Bentyl 10 mg by mouth every 6 hours when necessary for cramping/spasm I will follow her up in the office in about 10 days if she's not making any any improvement she will need endoscopic evaluation with colonoscopy and EGD with Dr. Henrene Pastor.  Eleni Frank Genia Harold PA-C 10/02/2016   Cc: Burnard Bunting, MD

## 2016-10-03 NOTE — Progress Notes (Signed)
Initial assessment and plans noted 

## 2016-10-07 DIAGNOSIS — N6311 Unspecified lump in the right breast, upper outer quadrant: Secondary | ICD-10-CM | POA: Diagnosis not present

## 2016-10-07 DIAGNOSIS — N6312 Unspecified lump in the right breast, upper inner quadrant: Secondary | ICD-10-CM | POA: Diagnosis not present

## 2016-10-14 ENCOUNTER — Encounter: Payer: Self-pay | Admitting: Physician Assistant

## 2016-10-14 ENCOUNTER — Other Ambulatory Visit (INDEPENDENT_AMBULATORY_CARE_PROVIDER_SITE_OTHER): Payer: Medicare Other

## 2016-10-14 ENCOUNTER — Ambulatory Visit (INDEPENDENT_AMBULATORY_CARE_PROVIDER_SITE_OTHER): Payer: Medicare Other | Admitting: Physician Assistant

## 2016-10-14 ENCOUNTER — Inpatient Hospital Stay (HOSPITAL_COMMUNITY)
Admission: AD | Admit: 2016-10-14 | Discharge: 2016-10-17 | DRG: 372 | Disposition: A | Discharge status: Home / Self Care | Payer: Medicare Other | Source: Ambulatory Visit | Attending: Internal Medicine | Admitting: Internal Medicine

## 2016-10-14 ENCOUNTER — Encounter (HOSPITAL_COMMUNITY): Payer: Self-pay | Admitting: *Deleted

## 2016-10-14 VITALS — BP 124/64 | HR 100 | Ht 66.5 in | Wt 201.6 lb

## 2016-10-14 DIAGNOSIS — E860 Dehydration: Secondary | ICD-10-CM | POA: Diagnosis present

## 2016-10-14 DIAGNOSIS — K52839 Microscopic colitis, unspecified: Secondary | ICD-10-CM

## 2016-10-14 DIAGNOSIS — R197 Diarrhea, unspecified: Secondary | ICD-10-CM | POA: Diagnosis present

## 2016-10-14 DIAGNOSIS — Z841 Family history of disorders of kidney and ureter: Secondary | ICD-10-CM

## 2016-10-14 DIAGNOSIS — Z800 Family history of malignant neoplasm of digestive organs: Secondary | ICD-10-CM

## 2016-10-14 DIAGNOSIS — Z7982 Long term (current) use of aspirin: Secondary | ICD-10-CM

## 2016-10-14 DIAGNOSIS — Z8371 Family history of colonic polyps: Secondary | ICD-10-CM | POA: Diagnosis not present

## 2016-10-14 DIAGNOSIS — Z888 Allergy status to other drugs, medicaments and biological substances status: Secondary | ICD-10-CM

## 2016-10-14 DIAGNOSIS — J84112 Idiopathic pulmonary fibrosis: Secondary | ICD-10-CM | POA: Diagnosis present

## 2016-10-14 DIAGNOSIS — D519 Vitamin B12 deficiency anemia, unspecified: Secondary | ICD-10-CM | POA: Diagnosis present

## 2016-10-14 DIAGNOSIS — A0472 Enterocolitis due to Clostridium difficile, not specified as recurrent: Principal | ICD-10-CM | POA: Diagnosis present

## 2016-10-14 DIAGNOSIS — Z803 Family history of malignant neoplasm of breast: Secondary | ICD-10-CM

## 2016-10-14 DIAGNOSIS — G4733 Obstructive sleep apnea (adult) (pediatric): Secondary | ICD-10-CM | POA: Diagnosis present

## 2016-10-14 DIAGNOSIS — Z6828 Body mass index (BMI) 28.0-28.9, adult: Secondary | ICD-10-CM | POA: Diagnosis not present

## 2016-10-14 DIAGNOSIS — R1084 Generalized abdominal pain: Secondary | ICD-10-CM | POA: Diagnosis not present

## 2016-10-14 DIAGNOSIS — I252 Old myocardial infarction: Secondary | ICD-10-CM | POA: Diagnosis not present

## 2016-10-14 DIAGNOSIS — Z8042 Family history of malignant neoplasm of prostate: Secondary | ICD-10-CM | POA: Diagnosis not present

## 2016-10-14 DIAGNOSIS — M1990 Unspecified osteoarthritis, unspecified site: Secondary | ICD-10-CM | POA: Diagnosis present

## 2016-10-14 DIAGNOSIS — N179 Acute kidney failure, unspecified: Secondary | ICD-10-CM | POA: Diagnosis present

## 2016-10-14 DIAGNOSIS — Z9049 Acquired absence of other specified parts of digestive tract: Secondary | ICD-10-CM

## 2016-10-14 DIAGNOSIS — E039 Hypothyroidism, unspecified: Secondary | ICD-10-CM | POA: Diagnosis present

## 2016-10-14 DIAGNOSIS — E538 Deficiency of other specified B group vitamins: Secondary | ICD-10-CM | POA: Diagnosis not present

## 2016-10-14 DIAGNOSIS — E785 Hyperlipidemia, unspecified: Secondary | ICD-10-CM | POA: Diagnosis present

## 2016-10-14 DIAGNOSIS — I10 Essential (primary) hypertension: Secondary | ICD-10-CM | POA: Diagnosis not present

## 2016-10-14 DIAGNOSIS — I2510 Atherosclerotic heart disease of native coronary artery without angina pectoris: Secondary | ICD-10-CM | POA: Diagnosis present

## 2016-10-14 DIAGNOSIS — R531 Weakness: Secondary | ICD-10-CM

## 2016-10-14 DIAGNOSIS — N289 Disorder of kidney and ureter, unspecified: Secondary | ICD-10-CM

## 2016-10-14 DIAGNOSIS — Z8249 Family history of ischemic heart disease and other diseases of the circulatory system: Secondary | ICD-10-CM | POA: Diagnosis not present

## 2016-10-14 DIAGNOSIS — Z882 Allergy status to sulfonamides status: Secondary | ICD-10-CM | POA: Diagnosis not present

## 2016-10-14 DIAGNOSIS — Z955 Presence of coronary angioplasty implant and graft: Secondary | ICD-10-CM | POA: Diagnosis not present

## 2016-10-14 DIAGNOSIS — E669 Obesity, unspecified: Secondary | ICD-10-CM | POA: Diagnosis present

## 2016-10-14 LAB — CBC WITH DIFFERENTIAL/PLATELET
Basophils Absolute: 0 10*3/uL (ref 0.0–0.1)
Basophils Relative: 0.4 % (ref 0.0–3.0)
Eosinophils Absolute: 0.1 10*3/uL (ref 0.0–0.7)
Eosinophils Relative: 1.2 % (ref 0.0–5.0)
HCT: 37.3 % (ref 36.0–46.0)
Hemoglobin: 12.9 g/dL (ref 12.0–15.0)
Lymphocytes Relative: 35.1 % (ref 12.0–46.0)
Lymphs Abs: 3.5 10*3/uL (ref 0.7–4.0)
MCHC: 34.5 g/dL (ref 30.0–36.0)
MCV: 100.9 fl — ABNORMAL HIGH (ref 78.0–100.0)
Monocytes Absolute: 0.6 10*3/uL (ref 0.1–1.0)
Monocytes Relative: 6.4 % (ref 3.0–12.0)
Neutro Abs: 5.6 10*3/uL (ref 1.4–7.7)
Neutrophils Relative %: 56.9 % (ref 43.0–77.0)
Platelets: 407 10*3/uL — ABNORMAL HIGH (ref 150.0–400.0)
RBC: 3.7 Mil/uL — ABNORMAL LOW (ref 3.87–5.11)
RDW: 13.6 % (ref 11.5–15.5)
WBC: 9.8 10*3/uL (ref 4.0–10.5)

## 2016-10-14 LAB — COMPREHENSIVE METABOLIC PANEL
ALT: 12 U/L (ref 0–35)
AST: 15 U/L (ref 0–37)
Albumin: 4.1 g/dL (ref 3.5–5.2)
Alkaline Phosphatase: 78 U/L (ref 39–117)
BUN: 18 mg/dL (ref 6–23)
CO2: 25 mEq/L (ref 19–32)
Calcium: 9.8 mg/dL (ref 8.4–10.5)
Chloride: 105 mEq/L (ref 96–112)
Creatinine, Ser: 1.66 mg/dL — ABNORMAL HIGH (ref 0.40–1.20)
GFR: 32.1 mL/min — ABNORMAL LOW (ref >60.00)
Glucose, Bld: 114 mg/dL — ABNORMAL HIGH (ref 70–99)
Potassium: 3.5 mEq/L (ref 3.5–5.1)
Sodium: 138 mEq/L (ref 135–145)
Total Bilirubin: 0.4 mg/dL (ref 0.2–1.2)
Total Protein: 7.9 g/dL (ref 6.0–8.3)

## 2016-10-14 MED ORDER — FENTANYL CITRATE (PF) 100 MCG/2ML IJ SOLN: 25.0000 ug | INTRAMUSCULAR | Status: DC | PRN

## 2016-10-14 MED ORDER — ISOSORBIDE MONONITRATE ER 30 MG PO TB24
60.0000 mg | ORAL_TABLET | Freq: Every day | ORAL | Status: DC
  Administered 2016-10-14 – 2016-10-16 (×3): 60 mg via ORAL
  Filled 2016-10-14 (×4): qty 2

## 2016-10-14 MED ORDER — PROMETHAZINE HCL 25 MG/ML IJ SOLN: 12.5000 mg | Freq: Four times a day (QID) | INTRAMUSCULAR | Status: DC | PRN

## 2016-10-14 MED ORDER — FAMOTIDINE 20 MG PO TABS
40.0000 mg | ORAL_TABLET | Freq: Every day | ORAL | Status: DC
  Administered 2016-10-14 – 2016-10-16 (×3): 40 mg via ORAL
  Filled 2016-10-14 (×4): qty 2

## 2016-10-14 MED ORDER — ONDANSETRON HCL 4 MG/2ML IJ SOLN
4.0000 mg | Freq: Four times a day (QID) | INTRAMUSCULAR | Status: DC | PRN
  Administered 2016-10-15 (×2): 4 mg via INTRAVENOUS
  Filled 2016-10-14 (×2): qty 2

## 2016-10-14 MED ORDER — HEPARIN SODIUM (PORCINE) 5000 UNIT/ML IJ SOLN
5000.0000 [IU] | Freq: Three times a day (TID) | INTRAMUSCULAR | Status: DC
  Administered 2016-10-14 – 2016-10-17 (×8): 5000 [IU] via SUBCUTANEOUS
  Filled 2016-10-14 (×8): qty 1

## 2016-10-14 MED ORDER — ONDANSETRON HCL 4 MG PO TABS: 4.0000 mg | ORAL_TABLET | Freq: Four times a day (QID) | ORAL | Status: DC | PRN

## 2016-10-14 MED ORDER — AMLODIPINE BESYLATE 5 MG PO TABS
5.0000 mg | ORAL_TABLET | Freq: Every day | ORAL | Status: DC
  Administered 2016-10-15 – 2016-10-17 (×3): 5 mg via ORAL
  Filled 2016-10-14 (×6): qty 1

## 2016-10-14 MED ORDER — SODIUM CHLORIDE 0.9 % IV SOLN
INTRAVENOUS | Status: AC
  Administered 2016-10-14: 22:00:00 via INTRAVENOUS
  Administered 2016-10-15: 1000 mL via INTRAVENOUS
  Administered 2016-10-15: 15:00:00 via INTRAVENOUS

## 2016-10-14 MED ORDER — ACETAMINOPHEN 325 MG PO TABS: 650.0000 mg | ORAL_TABLET | Freq: Four times a day (QID) | ORAL | Status: DC | PRN

## 2016-10-14 MED ORDER — ACETAMINOPHEN 650 MG RE SUPP: 650.0000 mg | Freq: Four times a day (QID) | RECTAL | Status: DC | PRN

## 2016-10-14 MED ORDER — HYDROCODONE-ACETAMINOPHEN 5-325 MG PO TABS
1.0000 | ORAL_TABLET | ORAL | Status: DC | PRN
  Administered 2016-10-14: 1 via ORAL
  Filled 2016-10-14: qty 1
  Filled 2016-10-14: qty 2

## 2016-10-14 MED ORDER — METOPROLOL SUCCINATE ER 50 MG PO TB24
50.0000 mg | ORAL_TABLET | Freq: Every day | ORAL | Status: DC
  Administered 2016-10-15 – 2016-10-17 (×3): 50 mg via ORAL
  Filled 2016-10-14 (×3): qty 1

## 2016-10-14 MED ORDER — ASPIRIN EC 81 MG PO TBEC
81.0000 mg | DELAYED_RELEASE_TABLET | Freq: Every day | ORAL | Status: DC
  Administered 2016-10-15 – 2016-10-17 (×3): 81 mg via ORAL
  Filled 2016-10-14 (×3): qty 1

## 2016-10-14 NOTE — Patient Instructions (Addendum)
We have sent the following medications to your pharmacy for you to pick up at your convenience:  Zofran 4 mg every 6 hrs as needed   Push fluids, very bland diet   Imodium 2 tablets twice a day through Wednesday night.   You have been scheduled for a colonoscopy. Please follow written instructions given to you at your visit today.  Please pick up your prep supplies at the pharmacy within the next 1-3 days. If you use inhalers (even only as needed), please bring them with you on the day of your procedure. Your physician has requested that you go to www.startemmi.com and enter the access code given to you at your visit today. This web site gives a general overview about your procedure. However, you should still follow specific instructions given to you by our office regarding your preparation for the procedure.

## 2016-10-14 NOTE — Progress Notes (Signed)
Reviewed PA evaluation and discussed. Complicated patient medically with functional issues and depression as well. Plans for admission to try to sort things out noted

## 2016-10-14 NOTE — Progress Notes (Signed)
Pt refused cpap qhs.  Pt states she is still having bouts of nausea, vomiting and diarrhea.  Do to risk of aspiration from vomiting Pt and RT agreed to hold CPAP for tonight and re-evaluate tomorrow night.

## 2016-10-14 NOTE — Progress Notes (Signed)
Subjective:    Patient ID: Jody Ruiz, female    DOB: Jun 21, 1941, 76 y.o.   MRN: [id number]  HPI Jody Ruiz is a 77 year old white female known to Dr. Henrene Pastor and myself with history of microscopic colitis. She also has history of hypertension, coronary artery disease status post MI and stent, obstructive sleep apnea and idiopathic pulmonary fibrosis. She had been diagnosed with lymphocytic colitis several years ago on colonoscopy per Dr. Amedeo Plenty. Her last colonoscopy here in November 2016 with finding of multiple tubular adenomas and one sessile serrated polyp She was not rebiopsied for microscopic colitis at that time. She was recommended to have 3 year interval follow-up. She began having an exacerbation of her colitis symptoms in November 2017 after she had taken a course of antibiotics. She was initially treated by her PCP with a course of Flagyl had some mild improvement and then relapsed. GI pathogen panel was done and negative up she was then seen in our office and started on budesonide 9 mg daily She tolerated this for about 6 weeks and felt that it was helping her diarrhea and she was feeling a little bit better but then began having persistent belching bloating and abdominal discomfort which she feels was secondary to the budesonide. We started her on Sig 40 twice a day which did not seem to help she stopped all of the GI medications. I saw her back on 10/02/2016 having 5 or 6 watery bowel movements per day but still complaining of urgency abdominal discomfort nausea and bloating. We started her on high dose Pepto-Bismol 2 tablets 3 times daily for 6 weeks, continued Bentyl and Pepcid. She had labs done showing a mild elevation in sedimentation rate and a creatinine of 1.5. She comes back in today feeling worse. She did not continue the Pepto-Bismol and has not been taking anything on a regular basis. As she is taking Pepcid most days. She seems frustrated and depressed about her situation. She  continues to belch repeatedly and no she's not been vomiting or particularly nauseated she says she's not able to eat much of anything at all or drink. She is having 10 bowel movements per day watery stools, some nocturnal episodes of diarrhea and some incontinence.  No fever. She was barely able to walk down the hall wall in the office today.  Review of Systems Pertinent positive and negative review of systems were noted in the above HPI section.  All other review of systems was otherwise negative.  Outpatient Encounter Prescriptions as of 10/14/2016  Medication Sig  . amLODipine (NORVASC) 5 MG tablet TAKE 1 TABLET DAILY  . aspirin EC 81 MG tablet Take 81 mg by mouth daily.  . isosorbide mononitrate (IMDUR) 60 MG 24 hr tablet TAKE ONE TABLET AT BEDTIME  . metoprolol succinate (TOPROL XL) 50 MG 24 hr tablet Take 1 tablet (50 mg total) by mouth daily. Take with or immediately following a meal.  . nitroGLYCERIN (NITROSTAT) 0.4 MG SL tablet Place 1 tablet (0.4 mg total) under the tongue every 5 (five) minutes x 3 doses as needed for chest pain.  . NONFORMULARY OR COMPOUNDED ITEM Budesonide 5 mg DR capsules SIG: take 2 capsules daily  . polyvinyl alcohol (LIQUIFILM TEARS) 1.4 % ophthalmic solution Place 1 drop into both eyes as needed. For dry eyes   No facility-administered encounter medications on file as of 10/14/2016.    Allergies  Allergen Reactions  . Brilinta [Ticagrelor] Shortness Of Breath and Cough    fatigue  .  Lisinopril Shortness Of Breath and Cough  . Statins     Muscle Weakness  . Zetia [Ezetimibe]     Myalgias   . Macrodantin [Nitrofurantoin Macrocrystal] Rash  . Sulfa Antibiotics Rash   Patient Active Problem List   Diagnosis Date Noted  . Lymphocytic colitis 10/02/2016  . Hx of adenomatous colonic polyps 10/02/2016  . Altered mental status   . CAD (coronary artery disease)   . Interstitial lung disease ([place])   . HLD (hyperlipidemia)   . Precordial pain 07/17/2013  .  Midsternal chest pain 07/17/2013  . Pulmonary nodule 11/22/2012  . 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 OSA (obstructive sleep apnea) 10/05/2012  . IPF (idiopathic pulmonary fibrosis) ([place]) 08/26/2012  . Coronary atherosclerosis of native coronary artery 05/04/2012  . Hypertension   . Obesity    Social History   Social History  . Marital status: Married    Spouse name: N/A  . Number of children: 3  . Years of education: N/A   Occupational History  . Retired Therapist, sports Retired   Social History Main Topics  . Smoking status: Never Smoker  . Smokeless tobacco: Never Used  . Alcohol use No  . Drug use: No  . Sexual activity: Not on file   Other Topics Concern  . Not on file   Social History Narrative  . No narrative on file    Jody Ruiz's family history includes Breast cancer in her sister; Colon cancer (age of onset: 62) in her sister; Colon polyps in her father; Heart disease (age of onset: 57) in her brother; Heart disease (age of onset: 13) in her mother; Heart disease (age of onset: 76) in her father; Kidney disease in her father; Prostate cancer in her father.      Objective:    Vitals:   10/14/16 1520  BP: 124/64  Pulse: 100    Physical Exam well-developed older white female, pale, tearful and frustrated, crying at times. Blood pressure 124/64 pulse 100, height 5 foot 6, weight 201, BMI 32.0 Weight down 2 pounds from last office visit. HEENT; nontraumatic normocephalic EOMI PERRLA sclerae anicteric, Buccal mucosa somewhat dry, Cardiovascular; regular rate and rhythm with S1-S2 slightly tachycardia, Very clear bilaterally, Abdomen ;soft, just some generalized tenderness nonfocal there is no guarding or rebound bowel sounds are present no palpable mass or hepatosplenomegaly, Rectal; exam not done patient did have an episode of watery diarrhea while here in the office light brown and no blood, Extremities; no clubbing cyanosis or edema skin warm and dry, Neuropsych;  affect flat and patient appears depressed       Assessment & Plan:   #46 76 year old white female with history of microscopic colitis refractory to outpatient management. She seemed to have some initial response to budesonide but then stopped it because of perceived side effects of bloating abdominal pain and belching. She has not had any response to Pepto-Bismol but did not stay on it very long. It is unclear whether all of her symptoms at this time are secondary to microscopic colitis or whether she has superimposed C. difficile or other colitis #2 weakness, mild weight loss secondary to above #3 persistent belching and burping-rule out esophagitis, gastropathy and peptic ulcer disease #4 history of hypertension #5 sleep apnea #6 neuropathic pulmonary fibrosis #7 coronary artery disease status post previous MI and stent  Plan; Patient initially very reluctant to be hospitalized that also did not feel that she would be able to wait several days for procedures or  complete a prep at home.  Eventually she was convinced to be hospitalized for labs, repeat stool studies, rehydration, and then plans for colonoscopy and EGD per Dr. Ardis Hughs later this week. I spoke to Dr. Wynelle Cleveland Sheliah Plane hospitalist who accepted patient on the hospitalist service and GI will follow.  Amy S Esterwood PA-C 10/14/2016   Cc: Burnard Bunting, MD

## 2016-10-14 NOTE — H&P (Signed)
History and Physical    Jody Ruiz ZOX:[id number] DOB: 20-Jun-1941 DOA: 10/14/2016  PCP: Geoffery Lyons, MD   Patient coming from: Home, by way of GI clinic  Chief Complaint: watery diarrhea, belching, lower abd cramping   HPI: Jody Ruiz is a 76 y.o. female with medical history significant for hypertension, OSA, microscopic colitis, and coronary artery disease with stent who presents as a direct admission from the GI clinic for evaluation of persistent watery diarrhea and subsequent dehydration. Patient has history of microscopic colitis and had been doing well until suffering a flare after a course of antibiotics in November 2017. She improved briefly with a course of Flagyl at that time before relapsing. She had a negative GI panel back then and was started on budesonide by GI, seemed to be improving, but then, with increasing GI discomfort and frequent belching, it was eventually discontinued. She had then been kept on Pepcid and Pepto-Bismol, but felt that she was worsening despite this and stopped all of her GI medications. She now reports 5-10 watery bowel movements per day, sometimes occurring at night, and sometimes with incontinence. She also reports frequent belching and lower abdominal cramps and pain. She reports that the belching and lower abdominal pain is significantly worsened with any oral intake, so she has not been eating much of anything. She denies any fevers or chills over this interval and denies any significant nausea or vomiting at this time. She was evaluated for these complaints in the GI clinic today, noted to be significantly dehydrated and profoundly weak generally. Direct admission to the hospital was arranged for IV hydration and further diagnostic evaluation.   Patient was interviewed and examined on the medical surgical unit and was a long hospital where she remained hemodynamically stable and in no apparent respiratory distress. She is nontoxic  appearing, but no obvious discomfort and clinically dehydrated. Assessment and plan below.    Review of Systems:  All other systems reviewed and apart from HPI, are negative.  Past Medical History:  Diagnosis Date  . Arthritis   . Borderline hypothyroidism   . CAD (coronary artery disease)    a. 04/2012 NSTEMI/Cath:  pLAD 90%, mLAD 70-80% (long) (3x16 & 2.5x28 Promus DES to p/m LAD), pD1 70-80%, pRI 30% (small), CFX 20%, OM1 40%, mOM2 30%, pRCA 30%, PDA 30-40%, pPLB 70%, then mid 90%, EF 65%;  b. 03/2013 Abnl CL w/apical isch;  c. 04/2013 Cath: LM nl, LAD patent stents, LCX <20, RCA 30p PDA 40-50 EF 55-60% ->Cont Med Rx.  c. CP s/p LHC with stable dz and patent stents--> Rx medically   . Cataract   . Dyspnea    a. CP and SOB 06/2012 => Ticagrelor changed to Plavix  . HLD (hyperlipidemia)    intolerant to statins  . Hx of adenomatous colonic polyps   . Hx of echocardiogram    a. Echo 2/14:  mild LVH, EF 55-65%, Gr 1 diast dysfn, mild LAE  . Hypertension   . Idiopathic pulmonary fibrosis   . Internal hemorrhoids   . Lung nodules   . Microscopic colitis   . Myocardial infarction 2013  . Obesity   . OSA (obstructive sleep apnea)    a. on CPAP  . Sleep apnea    cpap    Past Surgical History:  Procedure Laterality Date  . APPENDECTOMY  1994  . BREAST BIOPSY  1965, 1975   bilateral  . CARDIAC CATHETERIZATION     x4  . CHOLECYSTECTOMY  2004  . CORONARY STENT PLACEMENT  04/2012   x2  . KNEE ARTHROSCOPY  2000   bilateral  . LEFT AND RIGHT HEART CATHETERIZATION WITH CORONARY ANGIOGRAM N/A 08/02/2014   Procedure: LEFT AND RIGHT HEART CATHETERIZATION WITH CORONARY ANGIOGRAM;  Surgeon: Burnell Blanks, MD;  Location: [facility] CATH LAB;  Service: Cardiovascular;  Laterality: N/A;  . LEFT HEART CATHETERIZATION WITH CORONARY ANGIOGRAM N/A 04/29/2012   Procedure: LEFT HEART CATHETERIZATION WITH CORONARY ANGIOGRAM;  Surgeon: Peter M Martinique, MD;  Location: [facility] CATH LAB;  Service:  Cardiovascular;  Laterality: N/A;  . LEFT HEART CATHETERIZATION WITH CORONARY ANGIOGRAM N/A 08/03/2012   Procedure: LEFT HEART CATHETERIZATION WITH CORONARY ANGIOGRAM;  Surgeon: Burnell Blanks, MD;  Location: [facility] CATH LAB;  Service: Cardiovascular;  Laterality: N/A;  . LEFT HEART CATHETERIZATION WITH CORONARY ANGIOGRAM N/A 04/12/2013   Procedure: LEFT HEART CATHETERIZATION WITH CORONARY ANGIOGRAM;  Surgeon: Peter M Martinique, MD;  Location: [facility] CATH LAB;  Service: Cardiovascular;  Laterality: N/A;  . PERCUTANEOUS CORONARY STENT INTERVENTION (PCI-S) N/A 04/29/2012   Procedure: PERCUTANEOUS CORONARY STENT INTERVENTION (PCI-S);  Surgeon: Peter M Martinique, MD;  Location: [facility] CATH LAB;  Service: Cardiovascular;  Laterality: N/A;  . SHOULDER ARTHROSCOPY  2012   left  . vaginal polypectomy  2004  . VENTRAL HERNIA REPAIR  2005     reports that she has never smoked. She has never used smokeless tobacco. She reports that she does not drink alcohol or use drugs.  Allergies  Allergen Reactions  . Brilinta [Ticagrelor] Shortness Of Breath, Other (See Comments) and Cough    Reaction:  Fatigue  . Lisinopril Shortness Of Breath and Cough  . Statins Other (See Comments)    Reaction:  Muscle pain/weakness  . Zetia [Ezetimibe] Other (See Comments)    Reaction:  Muscle pain/weakness   . Macrodantin [Nitrofurantoin Macrocrystal] Rash  . Sulfa Antibiotics Rash    Family History  Problem Relation Age of Onset  . Breast cancer Sister   . Prostate cancer Father   . Colon polyps Father   . Heart disease Father 87    CAD, died of ESRD  . Kidney disease Father   . Colon cancer Sister 87  . Heart disease Mother 71    Died acute MI  . Heart disease Brother 63    CABG  . Esophageal cancer Neg Hx   . Rectal cancer Neg Hx   . Stomach cancer Neg Hx      Prior to Admission medications   Medication Sig Start Date End Date Taking? Authorizing Provider  amLODipine (NORVASC) 5 MG tablet TAKE 1 TABLET DAILY  02/14/16  Yes Troy Sine, MD  aspirin EC 81 MG tablet Take 81 mg by mouth daily.   Yes Historical Provider, MD  bismuth subsalicylate (PEPTO BISMOL) 262 MG chewable tablet Chew 786 mg by mouth 3 (three) times daily as needed for indigestion or diarrhea or loose stools.   Yes Historical Provider, MD  famotidine (PEPCID) 40 MG tablet Take 40 mg by mouth daily.   Yes Historical Provider, MD  isosorbide mononitrate (IMDUR) 60 MG 24 hr tablet TAKE ONE TABLET AT BEDTIME 06/20/16  Yes Peter M Martinique, MD  metoprolol succinate (TOPROL XL) 50 MG 24 hr tablet Take 1 tablet (50 mg total) by mouth daily. Take with or immediately following a meal.   Yes Hillary Bow, MD  nitroGLYCERIN (NITROSTAT) 0.4 MG SL tablet Place 1 tablet (0.4 mg total) under the tongue every 5 (five) minutes x  3 doses as needed for chest pain. 08/03/14  Yes Eileen Stanford, PA-C    Physical Exam: Vitals:   10/14/16 1750  BP: (!) 146/89  Pulse: 88  Resp: 18  Temp: 97.9 F (36.6 C)  TempSrc: Oral  SpO2: 100%  Weight: 89.1 kg (196 lb 6.9 oz)  Height: 5\' 7"  (1.702 m)      Constitutional: NAD, calm, in apparent discomfort Eyes: PERTLA, lids and conjunctivae normal ENMT: Mucous membranes are dry. Posterior pharynx clear of any exudate or lesions.   Neck: normal, supple, no masses, no thyromegaly Respiratory: clear to auscultation bilaterally, no wheezing, no crackles. Normal respiratory effort.    Cardiovascular: S1 & S2 heard, regular rate and rhythm. No extremity edema. No significant JVD. Abdomen: No distension, tender in lower quadrants Lt > Rt. No rebound pain or guarding, no masses palpated. Bowel sounds active.  Musculoskeletal: no clubbing / cyanosis. No joint deformity upper and lower extremities.   Skin: no significant rashes, lesions, ulcers. Warm, dry, well-perfused. Poor turgor.  Neurologic: CN 2-12 grossly intact. Sensation intact, DTR normal. Strength 5/5 in all 4 limbs.  Psychiatric: Alert and oriented  x 3. Normal mood and affect.     Labs on Admission: I have personally reviewed following labs and imaging studies  CBC:  Recent Labs Lab 10/14/16 1614  WBC 9.8  NEUTROABS 5.6  HGB 12.9  HCT 37.3  MCV 100.9*  PLT 540.9*   Basic Metabolic Panel:  Recent Labs Lab 10/14/16 1614  NA 138  K 3.5  CL 105  CO2 25  GLUCOSE 114*  BUN 18  CREATININE 1.66*  CALCIUM 9.8   GFR: Estimated Creatinine Clearance: 34.6 mL/min (A) (by C-G formula based on SCr of 1.66 mg/dL (H)). Liver Function Tests:  Recent Labs Lab 10/14/16 1614  AST 15  ALT 12  ALKPHOS 78  BILITOT 0.4  PROT 7.9  ALBUMIN 4.1   No results for input(s): LIPASE, AMYLASE in the last 168 hours. No results for input(s): AMMONIA in the last 168 hours. Coagulation Profile: No results for input(s): INR, PROTIME in the last 168 hours. Cardiac Enzymes: No results for input(s): CKTOTAL, CKMB, CKMBINDEX, TROPONINI in the last 168 hours. BNP (last 3 results) No results for input(s): PROBNP in the last 8760 hours. HbA1C: No results for input(s): HGBA1C in the last 72 hours. CBG: No results for input(s): GLUCAP in the last 168 hours. Lipid Profile: No results for input(s): CHOL, HDL, LDLCALC, TRIG, CHOLHDL, LDLDIRECT in the last 72 hours. Thyroid Function Tests: No results for input(s): TSH, T4TOTAL, FREET4, T3FREE, THYROIDAB in the last 72 hours. Anemia Panel: No results for input(s): VITAMINB12, FOLATE, FERRITIN, TIBC, IRON, RETICCTPCT in the last 72 hours. Urine analysis: No results found for: COLORURINE, APPEARANCEUR, LABSPEC, PHURINE, GLUCOSEU, HGBUR, BILIRUBINUR, KETONESUR, PROTEINUR, UROBILINOGEN, NITRITE, LEUKOCYTESUR Sepsis Labs: @LABRCNTIP (procalcitonin:4,lacticidven:4) )No results found for this or any previous visit (from the past 240 hour(s)).   Radiological Exams on Admission: No results found.  EKG: Not performed, will obtain as appropriate.   Assessment/Plan  1. Diarrhea with dehydration  -  Pt has been suffering frequent loose stools since November '17 with transient periods of improvement - She has hx of microscopic colitis and is followed by GI; GI consulting and much appreciated, will follow-up recs  - No fever or leukocytosis on admission, GI panel and C diff pending, maintain enteric precautions for now   - Start IVF hydration with NS at 125 cc/hr, follow I/O's, repeat chem panel in am  -  PRN analgesia and antiemetics    2. Renal insufficiency  - SCr 1.66 on admission, up from 1.52 two weeks prior; had been <1 in September '17 - Pt is clinically dehydrated and reports very poor recent oral intake and a prerenal etiology suspected  - She is being fluid-resuscitated with NS as above - Avoid nephrotoxins where feasible, repeat chem panel in am    3. CAD  - No anginal complaints   - Continue ASA 81, Imdur, and Toprol as tolerated   4. Hypertension  - BP at goal   - Continue Norvasc and Toprol as tolerated    5. OSA - Stable, tolerating CPAP at home, will continue    DVT prophylaxis: sq Lovenox Code Status: Full  Family Communication: Daughter updated at bedside Disposition Plan: Admit to med-surg Consults called: Gastroenterology Admission status: Inpatient    Vianne Bulls, MD [facility] Pager [phone]  If 7PM-7AM, please contact night-coverage [email or web address] Password [email or web address]  10/14/2016, 7:03 PM

## 2016-10-15 DIAGNOSIS — A0472 Enterocolitis due to Clostridium difficile, not specified as recurrent: Secondary | ICD-10-CM

## 2016-10-15 DIAGNOSIS — I2510 Atherosclerotic heart disease of native coronary artery without angina pectoris: Secondary | ICD-10-CM

## 2016-10-15 DIAGNOSIS — I10 Essential (primary) hypertension: Secondary | ICD-10-CM

## 2016-10-15 DIAGNOSIS — G4733 Obstructive sleep apnea (adult) (pediatric): Secondary | ICD-10-CM

## 2016-10-15 DIAGNOSIS — R197 Diarrhea, unspecified: Secondary | ICD-10-CM

## 2016-10-15 DIAGNOSIS — K52839 Microscopic colitis, unspecified: Secondary | ICD-10-CM

## 2016-10-15 LAB — BASIC METABOLIC PANEL
Anion gap: 6 (ref 5–15)
BUN: 17 mg/dL (ref 6–20)
CALCIUM: 8.7 mg/dL — AB (ref 8.9–10.3)
CO2: 24 mmol/L (ref 22–32)
CREATININE: 1.52 mg/dL — AB (ref 0.44–1.00)
Chloride: 109 mmol/L (ref 101–111)
GFR calc non Af Amer: 33 mL/min — ABNORMAL LOW (ref >60)
GFR, EST AFRICAN AMERICAN: 38 mL/min — AB (ref >60)
GLUCOSE: 115 mg/dL — AB (ref 65–99)
Potassium: 3.6 mmol/L (ref 3.5–5.1)
Sodium: 139 mmol/L (ref 135–145)

## 2016-10-15 LAB — CBC WITH DIFFERENTIAL/PLATELET
BASOS PCT: 0 %
Basophils Absolute: 0 10*3/uL (ref 0.0–0.1)
EOS ABS: 0.1 10*3/uL (ref 0.0–0.7)
EOS PCT: 2 %
HCT: 30.7 % — ABNORMAL LOW (ref 36.0–46.0)
Hemoglobin: 10.5 g/dL — ABNORMAL LOW (ref 12.0–15.0)
Lymphocytes Relative: 43 %
Lymphs Abs: 2.9 10*3/uL (ref 0.7–4.0)
MCH: 34.4 pg — AB (ref 26.0–34.0)
MCHC: 34.2 g/dL (ref 30.0–36.0)
MCV: 100.7 fL — ABNORMAL HIGH (ref 78.0–100.0)
MONO ABS: 0.4 10*3/uL (ref 0.1–1.0)
MONOS PCT: 6 %
NEUTROS PCT: 49 %
Neutro Abs: 3.4 10*3/uL (ref 1.7–7.7)
Platelets: 303 10*3/uL (ref 150–400)
RBC: 3.05 MIL/uL — ABNORMAL LOW (ref 3.87–5.11)
RDW: 13 % (ref 11.5–15.5)
WBC: 6.8 10*3/uL (ref 4.0–10.5)

## 2016-10-15 LAB — TSH: TSH: 5.423 u[IU]/mL — AB (ref 0.350–4.500)

## 2016-10-15 LAB — CLOSTRIDIUM DIFFICILE BY PCR: Toxigenic C. Difficile by PCR: POSITIVE — AB

## 2016-10-15 LAB — VITAMIN B12

## 2016-10-15 LAB — C DIFFICILE QUICK SCREEN W PCR REFLEX
C DIFFICILE (CDIFF) TOXIN: NEGATIVE
C DIFFICLE (CDIFF) ANTIGEN: POSITIVE — AB

## 2016-10-15 LAB — FOLATE: FOLATE: 21.8 ng/mL (ref >5.9)

## 2016-10-15 MED ORDER — VANCOMYCIN 50 MG/ML ORAL SOLUTION
125.0000 mg | Freq: Four times a day (QID) | ORAL | Status: DC
  Administered 2016-10-15 – 2016-10-17 (×9): 125 mg via ORAL
  Filled 2016-10-15 (×11): qty 2.5

## 2016-10-15 MED ORDER — MAGNESIUM SULFATE 2 GM/50ML IV SOLN
2.0000 g | Freq: Once | INTRAVENOUS | Status: AC
  Administered 2016-10-15: 2 g via INTRAVENOUS
  Filled 2016-10-15: qty 50

## 2016-10-15 MED ORDER — POTASSIUM CHLORIDE CRYS ER 20 MEQ PO TBCR
40.0000 meq | EXTENDED_RELEASE_TABLET | Freq: Once | ORAL | Status: AC
  Administered 2016-10-15: 40 meq via ORAL
  Filled 2016-10-15: qty 2

## 2016-10-15 NOTE — Progress Notes (Signed)
     [facility] Progress Note  Chief Complaint:   diarrhea  Subjective: About the same as yesterday, still having diarrhea.   Objective:  Vital signs in last 24 hours: Temp:  [97.8 F (36.6 C)-98.6 F (37 C)] 97.8 F (36.6 C) (04/11 0625) Pulse Rate:  [69-100] 69 (04/11 0625) Resp:  [16-18] 16 (04/11 0625) BP: (117-146)/(64-89) 117/64 (04/11 0625) SpO2:  [95 %-100 %] 95 % (04/11 0625) Weight:  [196 lb 6.9 oz (89.1 kg)-201 lb 9.6 oz (91.4 kg)] 196 lb 6.9 oz (89.1 kg) (04/10 1750) Last BM Date: 10/14/16 General:   Alert, well-developed, white female in NAD EENT:  Normal hearing, non icteric sclera, conjunctive pink.  Heart:  Regular rate and rhythm, no lower extremity edema Pulm: Normal respiratory effor Abdomen:  Soft, nondistended, nontender.  Normal bowel sounds, no masses felt. No hepatomegaly.    Neurologic:  Alert and  oriented x4;  grossly normal neurologically. Psych:  Alert and cooperative. Normal mood and affect.   Intake/Output from previous day: 04/10 0701 - 04/11 0700 In: 1360 [P.O.:360; I.V.:1000] Out: -  Intake/Output this shift: No intake/output data recorded.  Lab Results:  Recent Labs  10/14/16 1614 10/15/16 0325  WBC 9.8 6.8  HGB 12.9 10.5*  HCT 37.3 30.7*  PLT 407.0* 303   BMET  Recent Labs  10/14/16 1614 10/15/16 0325  NA 138 139  K 3.5 3.6  CL 105 109  CO2 25 24  GLUCOSE 114* 115*  BUN 18 17  CREATININE 1.66* 1.52*  CALCIUM 9.8 8.7*   LFT  Recent Labs  10/14/16 1614  PROT 7.9  ALBUMIN 4.1  AST 15  ALT 12  ALKPHOS 78  BILITOT 0.4    Assessment / Plan:  75 yo female seen in our office yesterday for follow up on diarrhea which has been waxing and waning since November. Over the last few months she has been treated empirically with flagyl, high dose Pepto Bismol without any sustained benefit. Stool pathogen panel was negative.  Because patient had a remote history of lymphocytic colitis we treated her  empirically with budesonide which she didn't tolerate very well. She was admitted yesterday with diarrhea, weight loss and weakness. Plan was for EGD and colonoscopy this week but her stool has now come back positive for C-diff. Her WBC is normal, she is afebrile, abdominal exam is benign -start PO vancomycin -continue supportive care -hopefully home soon if diarrhea gets under control  AKI, Cr. Improving with IV hydration (1.66 >>>1.52)    LOS: 1 day   Maia Breslow  10/15/2016, 10:01 AM  Pager number [phone]     ________________________________________________________________________  Velora Heckler GI MD note:  I personally examined the patient, reviewed the data and agree with the assessment and plan described above.  Should be starting oral vancomycin soon.  Diet changed to heart healthy.  Hopefully we'll see improvement after 24-36 hours of antibiotics and if so then she will likely be safe for d/c.   Owens Loffler, MD [facility] Pager [phone]

## 2016-10-15 NOTE — Progress Notes (Signed)
Pt refused CPAP qhs.  Pt states that while her N/V has resolved, she did fine without it last night and wishes to sleep without it tonight as well so as to reduce her cost during her stay.  Education provided to Pt but still refuses.  Pt encouraged to contact RT if she changes her mind.

## 2016-10-15 NOTE — H&P (Signed)
PROGRESS NOTE  Jody Ruiz  ZOX:[id number] DOB: 01-20-1941 DOA: 10/14/2016 PCP: Jody Lyons, MD Outpatient Specialists:  Subjective: Multiple bowel movements overnight  Brief Narrative:  Jody Ruiz is a 76 y.o. female with medical history significant for hypertension, OSA, microscopic colitis, and coronary artery disease with stent who presents as a direct admission from the GI clinic for evaluation of persistent watery diarrhea and subsequent dehydration.   Assessment & Plan:   Principal Problem:   Diarrhea with dehydration Active Problems:   Hypertension   Coronary atherosclerosis of native coronary artery   OSA (obstructive sleep apnea)   Microscopic colitis   C. difficile colitis - Pt has been suffering frequent loose stools since November '17 with transient periods of improvement - She has hx of microscopic colitis and is followed by GI; GI consulting and much appreciated, will follow-up recs  - Although there is no fever or leukocytosis, GI panel came back positive for C. difficile. - Started on by mouth vancomycin per GI and there is no other reasons for the diarrhea.  Renal insufficiency  - SCr 1.66 on admission, up from 1.52 two weeks prior; had been <1 in September '17 - Pt is clinically dehydrated and reports very poor recent oral intake and a prerenal etiology suspected  - Unclear if this is chronic or acute secondary to dehydration. - Started on IV fluids, continue, check BMP in a.m.  CAD  - No anginal complaints   - Continue ASA 81, Imdur, and Toprol as tolerated   Hypertension  - BP at goal   - Continue Norvasc and Toprol as tolerated    OSA - Stable, tolerating CPAP at home, will continue   Macrocytosis - Check TSH, folate and B12.   DVT prophylaxis:  Code Status: Full Code Family Communication:  Disposition Plan:  Diet: Diet full liquid Room service appropriate? Yes; Fluid consistency: Thin  Consultants:    GI  Procedures:   None  Antimicrobials:   None   Objective: Vitals:   10/14/16 1750 10/14/16 2145 10/15/16 0625  BP: (!) 146/89 134/67 117/64  Pulse: 88 92 69  Resp: 18 18 16   Temp: 97.9 F (36.6 C) 98.6 F (37 C) 97.8 F (36.6 C)  TempSrc: Oral Oral Oral  SpO2: 100% 97% 95%  Weight: 89.1 kg (196 lb 6.9 oz)    Height: 5\' 7"  (1.702 m)      Intake/Output Summary (Last 24 hours) at 10/15/16 1211 Last data filed at 10/15/16 8119  Gross per 24 hour  Intake             1360 ml  Output                0 ml  Net             1360 ml   Filed Weights   10/14/16 1750  Weight: 89.1 kg (196 lb 6.9 oz)    Examination: General exam: Appears calm and comfortable  Respiratory system: Clear to auscultation. Respiratory effort normal. Cardiovascular system: S1 & S2 heard, RRR. No JVD, murmurs, rubs, gallops or clicks. No pedal edema. Gastrointestinal system: Abdomen is nondistended, soft and nontender. No organomegaly or masses felt. Normal bowel sounds heard. Central nervous system: Alert and oriented. No focal neurological deficits. Extremities: Symmetric 5 x 5 power. Skin: No rashes, lesions or ulcers Psychiatry: Judgement and insight appear normal. Mood & affect appropriate.   Data Reviewed: I have personally reviewed following labs and imaging studies  CBC:  Recent Labs Lab 10/14/16 1614 10/15/16 0325  WBC 9.8 6.8  NEUTROABS 5.6 3.4  HGB 12.9 10.5*  HCT 37.3 30.7*  MCV 100.9* 100.7*  PLT 407.0* 237   Basic Metabolic Panel:  Recent Labs Lab 10/14/16 1614 10/15/16 0325  NA 138 139  K 3.5 3.6  CL 105 109  CO2 25 24  GLUCOSE 114* 115*  BUN 18 17  CREATININE 1.66* 1.52*  CALCIUM 9.8 8.7*   GFR: Estimated Creatinine Clearance: 37.8 mL/min (A) (by C-G formula based on SCr of 1.52 mg/dL (H)). Liver Function Tests:  Recent Labs Lab 10/14/16 1614  AST 15  ALT 12  ALKPHOS 78  BILITOT 0.4  PROT 7.9  ALBUMIN 4.1   No results for input(s): LIPASE,  AMYLASE in the last 168 hours. No results for input(s): AMMONIA in the last 168 hours. Coagulation Profile: No results for input(s): INR, PROTIME in the last 168 hours. Cardiac Enzymes: No results for input(s): CKTOTAL, CKMB, CKMBINDEX, TROPONINI in the last 168 hours. BNP (last 3 results) No results for input(s): PROBNP in the last 8760 hours. HbA1C: No results for input(s): HGBA1C in the last 72 hours. CBG: No results for input(s): GLUCAP in the last 168 hours. Lipid Profile: No results for input(s): CHOL, HDL, LDLCALC, TRIG, CHOLHDL, LDLDIRECT in the last 72 hours. Thyroid Function Tests: No results for input(s): TSH, T4TOTAL, FREET4, T3FREE, THYROIDAB in the last 72 hours. Anemia Panel: No results for input(s): VITAMINB12, FOLATE, FERRITIN, TIBC, IRON, RETICCTPCT in the last 72 hours. Urine analysis: No results found for: COLORURINE, APPEARANCEUR, LABSPEC, PHURINE, GLUCOSEU, HGBUR, BILIRUBINUR, KETONESUR, PROTEINUR, UROBILINOGEN, NITRITE, LEUKOCYTESUR Sepsis Labs: @LABRCNTIP (procalcitonin:4,lacticidven:4)  ) Recent Results (from the past 240 hour(s))  C difficile quick scan w PCR reflex     Status: Abnormal   Collection Time: 10/14/16  7:02 PM  Result Value Ref Range Status   C Diff antigen POSITIVE (A) NEGATIVE Final   C Diff toxin NEGATIVE NEGATIVE Final   C Diff interpretation Results are indeterminate. See PCR results.  Final  Clostridium Difficile by PCR     Status: Abnormal   Collection Time: 10/14/16  7:02 PM  Result Value Ref Range Status   Toxigenic C Difficile by pcr POSITIVE (A) NEGATIVE Final    Comment: Positive for toxigenic C. difficile with little to no toxin production. Only treat if clinical presentation suggests symptomatic illness. Performed at [facility], [place] [address] [place],  [address]      Invalid input(s): PROCALCITONIN, LACTICACIDVEN   Radiology Studies: No results found.      Scheduled Meds: . amLODipine  5 mg Oral  QHS  . aspirin EC  81 mg Oral Daily  . famotidine  40 mg Oral QHS  . heparin  5,000 Units Subcutaneous Q8H  . isosorbide mononitrate  60 mg Oral QHS  . metoprolol succinate  50 mg Oral Daily  . vancomycin  125 mg Oral Q6H   Continuous Infusions:   LOS: 1 day    Time spent: 35 minutes    Demeisha Geraghty A, MD [facility] Pager [phone]  If 7PM-7AM, please contact night-coverage [email or web address] Password [facility] 10/15/2016, 12:11 PM

## 2016-10-16 DIAGNOSIS — E538 Deficiency of other specified B group vitamins: Secondary | ICD-10-CM

## 2016-10-16 DIAGNOSIS — A0472 Enterocolitis due to Clostridium difficile, not specified as recurrent: Principal | ICD-10-CM

## 2016-10-16 DIAGNOSIS — N289 Disorder of kidney and ureter, unspecified: Secondary | ICD-10-CM

## 2016-10-16 LAB — GASTROINTESTINAL PANEL BY PCR, STOOL (REPLACES STOOL CULTURE)
ASTROVIRUS: NOT DETECTED
Adenovirus F40/41: NOT DETECTED
CAMPYLOBACTER SPECIES: NOT DETECTED
CRYPTOSPORIDIUM: NOT DETECTED
CYCLOSPORA CAYETANENSIS: NOT DETECTED
ENTEROTOXIGENIC E COLI (ETEC): NOT DETECTED
Entamoeba histolytica: NOT DETECTED
Enteroaggregative E coli (EAEC): NOT DETECTED
Enteropathogenic E coli (EPEC): NOT DETECTED
Giardia lamblia: NOT DETECTED
Norovirus GI/GII: NOT DETECTED
PLESIMONAS SHIGELLOIDES: NOT DETECTED
ROTAVIRUS A: NOT DETECTED
SAPOVIRUS (I, II, IV, AND V): NOT DETECTED
SHIGA LIKE TOXIN PRODUCING E COLI (STEC): NOT DETECTED
Salmonella species: NOT DETECTED
Shigella/Enteroinvasive E coli (EIEC): NOT DETECTED
VIBRIO SPECIES: NOT DETECTED
Vibrio cholerae: NOT DETECTED
YERSINIA ENTEROCOLITICA: NOT DETECTED

## 2016-10-16 LAB — CBC
HCT: 30.3 % — ABNORMAL LOW (ref 36.0–46.0)
HEMOGLOBIN: 10.7 g/dL — AB (ref 12.0–15.0)
MCH: 34.7 pg — AB (ref 26.0–34.0)
MCHC: 35.3 g/dL (ref 30.0–36.0)
MCV: 98.4 fL (ref 78.0–100.0)
PLATELETS: 343 10*3/uL (ref 150–400)
RBC: 3.08 MIL/uL — AB (ref 3.87–5.11)
RDW: 12.9 % (ref 11.5–15.5)
WBC: 6.4 10*3/uL (ref 4.0–10.5)

## 2016-10-16 LAB — BASIC METABOLIC PANEL
ANION GAP: 8 (ref 5–15)
BUN: 14 mg/dL (ref 6–20)
CALCIUM: 8.8 mg/dL — AB (ref 8.9–10.3)
CHLORIDE: 108 mmol/L (ref 101–111)
CO2: 24 mmol/L (ref 22–32)
Creatinine, Ser: 1.5 mg/dL — ABNORMAL HIGH (ref 0.44–1.00)
GFR calc non Af Amer: 33 mL/min — ABNORMAL LOW (ref >60)
GFR, EST AFRICAN AMERICAN: 39 mL/min — AB (ref >60)
Glucose, Bld: 111 mg/dL — ABNORMAL HIGH (ref 65–99)
Potassium: 4.1 mmol/L (ref 3.5–5.1)
Sodium: 140 mmol/L (ref 135–145)

## 2016-10-16 LAB — MAGNESIUM: MAGNESIUM: 2.3 mg/dL (ref 1.7–2.4)

## 2016-10-16 MED ORDER — VITAMIN B-12 1000 MCG PO TABS
1000.0000 ug | ORAL_TABLET | Freq: Every day | ORAL | Status: DC
  Administered 2016-10-17: 1000 ug via ORAL
  Filled 2016-10-16: qty 1

## 2016-10-16 MED ORDER — CYANOCOBALAMIN 1000 MCG/ML IJ SOLN
1000.0000 ug | Freq: Once | INTRAMUSCULAR | Status: AC
  Administered 2016-10-16: 1000 ug via INTRAMUSCULAR
  Filled 2016-10-16: qty 1

## 2016-10-16 NOTE — Progress Notes (Signed)
Nutrition Brief Note  Patient identified on the Malnutrition Screening Tool (MST) Report  Pt's N/V has resolved. Tolerating diet, consuming 100% of meals in the past 2 days. Dinner on 4/11 provided 735 kcal and 22g protein. Breakfast on 4/12 provided 649 kcal and 18g protein.  Wt Readings from Last 15 Encounters:  10/16/16 184 lb 4.9 oz (83.6 kg)  10/14/16 201 lb 9.6 oz (91.4 kg)  10/02/16 201 lb 6 oz (91.3 kg)  09/22/16 205 lb 6.4 oz (93.2 kg)  08/04/16 203 lb (92.1 kg)  07/16/16 209 lb 3.2 oz (94.9 kg)  06/24/16 211 lb 4.8 oz (95.8 kg)  06/16/16 211 lb 6.4 oz (95.9 kg)  05/20/16 213 lb (96.6 kg)  05/19/16 213 lb 6.4 oz (96.8 kg)  03/21/16 218 lb (98.9 kg)  03/03/16 218 lb (98.9 kg)  12/12/15 217 lb 3.2 oz (98.5 kg)  11/21/15 215 lb (97.5 kg)  06/20/15 213 lb 6.4 oz (96.8 kg)    Body mass index is 28.87 kg/m. Patient meets criteria for overweight based on current BMI.   Current diet order is Heart Healthy, patient is consuming approximately 100% of meals at this time. Labs and medications reviewed.   No nutrition interventions warranted at this time. If nutrition issues arise, please consult RD.   Clayton Bibles, MS, RD, LDN Pager: [phone] After Hours Pager: [phone]

## 2016-10-16 NOTE — Progress Notes (Signed)
Pt has declined the use of CPAP tonight.  RT to monitor and assess as needed.  

## 2016-10-16 NOTE — Progress Notes (Signed)
PROGRESS NOTE    Jody Ruiz  OMV:[id number] DOB: Oct 07, 1940 DOA: 10/14/2016 PCP: Geoffery Lyons, MD    Subjective: Patient states she is having 4-5 diarrhea episodes overnight that is disturbing her sleep. Denies nausea/vomiting, abdominal pain, fever/chills.  Brief Narrative:   Jody Ruiz a 76 y.o.femalewith medical history significant for hypertension, OSA, microscopic colitis, and coronary artery disease with stent who presents as a direct admission from the GI clinic for evaluation of persistent watery diarrhea and subsequent dehydration. C. Diff positive on 4/11 and started on PO Vancomycin.   Assessment & Plan:   Principal Problem:   Diarrhea with dehydration Active Problems:   Enteritis due to Clostridium difficile   Microscopic colitis   Renal insufficiency   Hypertension   Coronary atherosclerosis of native coronary artery   OSA (obstructive sleep apnea)   B12 deficiency  C. difficile colitis - Pt has been suffering frequent loose stools since November '17 with transient periods of improvement - She has hx of microscopic colitis and is followed by GI; GI consulting and much appreciated, will follow-up recs  - Although there is no fever or leukocytosis, GI panel came back positive for C. Difficile on 4/11  - Continue PO Vancomycin and consider discharge tomorrow with continued antibiotics at home - Appreciate GI consult to consider bulk forming laxatives, if diarrhea does not improve.  Renal insufficiency  - SCr 1.66 on admission, up from 1.52 two weeks prior; had been <1 in September '17 - Pt is clinically dehydrated and reports very poor recent oral intake and a prerenal etiology suspected  - Continue IV fluids. Creatinine on 4/12 is back to baseline at 1.5.  CAD  - No anginal complaints  - Continue ASA 81, Imdur, and Toprol-XL as tolerated   Hypertension  - BP at goal  - Continue Norvasc and Toprol as tolerated   OSA - Stable,  tolerating CPAP at home, will continue   Macrocytosis/Anemia - Checked TSH, folate and B12. - B12 deficiency < 50, administer IM B12. - Folate WNL.  - TSH elevated at 5.4 and is patient's baseline per TSH of 5.7 on 03/18/16  DVT prophylaxis: Heparin Code Status: Full Family Communication: No family at bedside Disposition Plan: Home, plan to discharge tomorrow  Consultants:   GI  Procedures:  - None  Antimicrobials:  - Oral Vancomycin 4/11 >>   Objective: Vitals:   10/15/16 0625 10/15/16 1436 10/15/16 2030 10/16/16 0428  BP: 117/64 137/70 125/71 135/72  Pulse: 69 65 69 69  Resp: 16 16 18 16   Temp: 97.8 F (36.6 C) 98.5 F (36.9 C) 98.8 F (37.1 C) 98.5 F (36.9 C)  TempSrc: Oral Oral Oral Oral  SpO2: 95% 95% 96% 100%  Weight:    83.6 kg (184 lb 4.9 oz)  Height:        Intake/Output Summary (Last 24 hours) at 10/16/16 1107 Last data filed at 10/16/16 0950  Gross per 24 hour  Intake             1000 ml  Output                0 ml  Net             1000 ml   Filed Weights   10/14/16 1750 10/16/16 0428  Weight: 89.1 kg (196 lb 6.9 oz) 83.6 kg (184 lb 4.9 oz)    Examination:  General exam: Appears calm and comfortable  Respiratory system: Clear to auscultation. Respiratory effort  normal. Cardiovascular system: S1 & S2 heard, RRR. No JVD, murmurs, rubs, gallops or clicks. No pedal edema. Gastrointestinal system: Abdomen is nondistended, soft and nontender. Normal bowel sounds heard. Central nervous system: Alert and oriented. No focal neurological deficits. Extremities: Symmetric 5 x 5 power. Skin: No rashes, lesions or ulcers Psychiatry: Judgement and insight appear normal. Mood & affect appropriate.   Data Reviewed: I have personally reviewed following labs and imaging studies  CBC:  Recent Labs Lab 10/14/16 1614 10/15/16 0325 10/16/16 0410  WBC 9.8 6.8 6.4  NEUTROABS 5.6 3.4  --   HGB 12.9 10.5* 10.7*  HCT 37.3 30.7* 30.3*  MCV 100.9* 100.7*  98.4  PLT 407.0* 303 300   Basic Metabolic Panel:  Recent Labs Lab 10/14/16 1614 10/15/16 0325 10/16/16 0410  NA 138 139 140  K 3.5 3.6 4.1  CL 105 109 108  CO2 25 24 24   GLUCOSE 114* 115* 111*  BUN 18 17 14   CREATININE 1.66* 1.52* 1.50*  CALCIUM 9.8 8.7* 8.8*  MG  --   --  2.3   GFR: Estimated Creatinine Clearance: 37.1 mL/min (A) (by C-G formula based on SCr of 1.5 mg/dL (H)). Liver Function Tests:  Recent Labs Lab 10/14/16 1614  AST 15  ALT 12  ALKPHOS 78  BILITOT 0.4  PROT 7.9  ALBUMIN 4.1  Thyroid Function Tests:  Recent Labs  10/15/16 1313  TSH 5.423*   Anemia Panel:  Recent Labs  10/15/16 1313  VITAMINB12 <50*  FOLATE 21.8   Sepsis Labs: No results for input(s): PROCALCITON, LATICACIDVEN in the last 168 hours.  Recent Results (from the past 240 hour(s))  C difficile quick scan w PCR reflex     Status: Abnormal   Collection Time: 10/14/16  7:02 PM  Result Value Ref Range Status   C Diff antigen POSITIVE (A) NEGATIVE Final   C Diff toxin NEGATIVE NEGATIVE Final   C Diff interpretation Results are indeterminate. See PCR results.  Final  Gastrointestinal Panel by PCR , Stool     Status: None   Collection Time: 10/14/16  7:02 PM  Result Value Ref Range Status   Campylobacter species NOT DETECTED NOT DETECTED Final   Plesimonas shigelloides NOT DETECTED NOT DETECTED Final   Salmonella species NOT DETECTED NOT DETECTED Final   Yersinia enterocolitica NOT DETECTED NOT DETECTED Final   Vibrio species NOT DETECTED NOT DETECTED Final   Vibrio cholerae NOT DETECTED NOT DETECTED Final   Enteroaggregative E coli (EAEC) NOT DETECTED NOT DETECTED Final   Enteropathogenic E coli (EPEC) NOT DETECTED NOT DETECTED Final   Enterotoxigenic E coli (ETEC) NOT DETECTED NOT DETECTED Final   Shiga like toxin producing E coli (STEC) NOT DETECTED NOT DETECTED Final   Shigella/Enteroinvasive E coli (EIEC) NOT DETECTED NOT DETECTED Final   Cryptosporidium NOT DETECTED  NOT DETECTED Final   Cyclospora cayetanensis NOT DETECTED NOT DETECTED Final   Entamoeba histolytica NOT DETECTED NOT DETECTED Final   Giardia lamblia NOT DETECTED NOT DETECTED Final   Adenovirus F40/41 NOT DETECTED NOT DETECTED Final   Astrovirus NOT DETECTED NOT DETECTED Final   Norovirus GI/GII NOT DETECTED NOT DETECTED Final   Rotavirus A NOT DETECTED NOT DETECTED Final   Sapovirus (I, II, IV, and V) NOT DETECTED NOT DETECTED Final  Clostridium Difficile by PCR     Status: Abnormal   Collection Time: 10/14/16  7:02 PM  Result Value Ref Range Status   Toxigenic C Difficile by pcr POSITIVE (A) NEGATIVE Final  Comment: Positive for toxigenic C. difficile with little to no toxin production. Only treat if clinical presentation suggests symptomatic illness. Performed at [facility], [place] [address] [place], [place] [address]     Radiology Studies: No results found.   Scheduled Meds: . amLODipine  5 mg Oral QHS  . aspirin EC  81 mg Oral Daily  . famotidine  40 mg Oral QHS  . heparin  5,000 Units Subcutaneous Q8H  . isosorbide mononitrate  60 mg Oral QHS  . metoprolol succinate  50 mg Oral Daily  . vancomycin  125 mg Oral Q6H   Continuous Infusions:   LOS: 2 days    Time spent: 30 minutes  Hajar Sakhi, PA-Student  If 7PM-7AM, please contact night-coverage [email or web address] Password [email or web address] 10/16/2016, 11:07 AM   Birdie Hopes Pager: [phone] 10/16/2016, 12:29 PM

## 2016-10-16 NOTE — Progress Notes (Signed)
Progress Note   Subjective  Chief Complaint: diarrhea  This morning the patient has had 3 total doses of Vancomycin and reports tolerating her heart healthy diet. She did have 3 loose stools overnight and another this morning, but overall feels improved and is "getting stronger". She does tell me that the Vancomycin does not taste good the way they are giving it to her and sometimes makes her nauseous so she did require Zofran after dosing last night, which helped her sleep. The patient is excited about the prospect of leaving tomorrow if she continues to improve. Decreased abdominal pain.   Objective   Vital signs in last 24 hours: Temp:  [98.5 F (36.9 C)-98.8 F (37.1 C)] 98.5 F (36.9 C) (04/12 0428) Pulse Rate:  [65-69] 69 (04/12 0428) Resp:  [16-18] 16 (04/12 0428) BP: (125-137)/(70-72) 135/72 (04/12 0428) SpO2:  [95 %-100 %] 100 % (04/12 0428) Weight:  [184 lb 4.9 oz (83.6 kg)] 184 lb 4.9 oz (83.6 kg) (04/12 0428) Last BM Date: 10/15/16 General:Caucasian female in NAD Heart:  Regular rate and rhythm; no murmurs Lungs: Respirations even and unlabored, lungs CTA bilaterally Abdomen:  Soft, nontender and nondistended. Increased BS all four quadrants Extremities:  Without edema. Neurologic:  Alert and oriented,  grossly normal neurologically. Psych:  Cooperative. Normal mood and affect.  Intake/Output from previous day: 04/11 0701 - 04/12 0700 In: 1110 [P.O.:1110] Out: -   Lab Results:  Recent Labs  10/14/16 1614 10/15/16 0325 10/16/16 0410  WBC 9.8 6.8 6.4  HGB 12.9 10.5* 10.7*  HCT 37.3 30.7* 30.3*  PLT 407.0* 303 343   BMET  Recent Labs  10/14/16 1614 10/15/16 0325 10/16/16 0410  NA 138 139 140  K 3.5 3.6 4.1  CL 105 109 108  CO2 25 24 24   GLUCOSE 114* 115* 111*  BUN 18 17 14   CREATININE 1.66* 1.52* 1.50*  CALCIUM 9.8 8.7* 8.8*   LFT  Recent Labs  10/14/16 1614  PROT 7.9  ALBUMIN 4.1  AST 15  ALT 12  ALKPHOS 78  BILITOT 0.4     Assessment / Plan:   Assessment: 1. C.Diff diarrhea: h/o diarrhea waxing and waning since Nov, empiric tx with 2 rounds of Flagyl, high dose pepto bismol and negative stool pathogen panel, empiric tx with Budesonide for remote h/o lymphocytic colitis, plans were for EGD/Colo but pt found to be + for Cdiff, started on oral vanc yesterday, improved today  Plan: 1. Patient improving, continue oral Vancomycin as prescribed 2. Continue heart healthy diet 3. Continue prn Zofran 4. Patient did ask about "Questran", apparently hospitalist talked about adding this in to help "bulk" her stools-I discussed that likely this would not be necessary since starting Vancomycin 5. Pt will likely be ready for discharge tomorrow 6. Please await any further recs from Dr. Ardis Hughs  Thank you for your kind consultation, we will continue to follow.   LOS: 2 days   Levin Erp  10/16/2016, 9:30 AM  Pager # [phone]   ________________________________________________________________________  Velora Heckler GI MD note:  I personally examined the patient, reviewed the data and agree with the assessment and plan described above.  She is clearly improving. Probably home tomorrow on oral vanc for a total of 2 weeks.  Would not recommend questran since she is improving and sometime the [place] will bind/deactivate vancomycin.  If home tomorrow, will arrange GI follow up in 3-4 weeks.     Owens Loffler, MD [facility] Gastroenterology Pager [phone]

## 2016-10-17 ENCOUNTER — Encounter: Payer: Medicare Other | Admitting: Gastroenterology

## 2016-10-17 ENCOUNTER — Telehealth: Payer: Self-pay | Admitting: Cardiology

## 2016-10-17 LAB — BASIC METABOLIC PANEL
Anion gap: 6 (ref 5–15)
BUN: 16 mg/dL (ref 6–20)
CHLORIDE: 108 mmol/L (ref 101–111)
CO2: 26 mmol/L (ref 22–32)
CREATININE: 1.95 mg/dL — AB (ref 0.44–1.00)
Calcium: 9 mg/dL (ref 8.9–10.3)
GFR calc Af Amer: 28 mL/min — ABNORMAL LOW (ref >60)
GFR calc non Af Amer: 24 mL/min — ABNORMAL LOW (ref >60)
GLUCOSE: 111 mg/dL — AB (ref 65–99)
POTASSIUM: 4.4 mmol/L (ref 3.5–5.1)
SODIUM: 140 mmol/L (ref 135–145)

## 2016-10-17 LAB — GLUCOSE, CAPILLARY: GLUCOSE-CAPILLARY: 108 mg/dL — AB (ref 65–99)

## 2016-10-17 MED ORDER — VANCOMYCIN HCL 125 MG PO CAPS: 125.0000 mg | ORAL_CAPSULE | Freq: Four times a day (QID) | ORAL | 0 refills | Status: AC

## 2016-10-17 MED ORDER — VITAMIN B12 1000 MCG PO TBCR: 1.0000 | EXTENDED_RELEASE_TABLET | Freq: Every day | ORAL | 0 refills | Status: DC

## 2016-10-17 NOTE — Care Management Note (Signed)
Case Management Note  Patient Details  Name: Jody Ruiz MRN: [id number] Date of Birth: [birth year]  Subjective/Objective:       76 yo admitted with Diarrhea with dehydration.             Action/Plan: From home with spouse. Pt independent with ADLs and has PCP and insurance. No CM needs identified.  Expected Discharge Date:  10/17/16               Expected Discharge Plan:  Home/Self Care  In-House Referral:     Discharge planning Services  CM Consult  Post Acute Care Choice:    Choice offered to:     DME Arranged:    DME Agency:     HH Arranged:    HH Agency:     Status of Service:  Completed, signed off  If discussed at [employer] of Stay Meetings, dates discussed:    Additional CommentsLynnell Catalan, RN 10/17/2016, 11:59 AM  [phone]

## 2016-10-17 NOTE — Discharge Summary (Signed)
Physician Discharge Summary  DANISSA RUNDLE JJH:[id number] DOB: 11-12-1940 DOA: 10/14/2016  PCP: Geoffery Lyons, MD  Admit date: 10/14/2016 Discharge date: 10/17/2016  Admitted From: Home Disposition: Home  Recommendations for Outpatient Follow-up:  1. Follow up with PCP in 1-2 weeks 2. Please obtain BMP/CBC in one week  Home Health: NA Equipment/Devices:NA  Discharge Condition: Stable CODE STATUS: Full Code Diet recommendation: Diet Heart Room service appropriate? Yes; Fluid consistency: Thin Diet - low sodium heart healthy  Brief/Interim Summary: Jody B Knightis a 76 y.o.femalewith medical history significant for hypertension, OSA, microscopic colitis, and coronary artery disease with stent who presents as a direct admission from the GI clinic for evaluation of persistent watery diarrhea and subsequent dehydration. C. Diff positive on 4/11 and started on PO Vancomycin.   Discharge Diagnoses:  Principal Problem:   Diarrhea with dehydration Active Problems:   Hypertension   Coronary atherosclerosis of native coronary artery   OSA (obstructive sleep apnea)   Microscopic colitis   Enteritis due to Clostridium difficile   Renal insufficiency   B12 deficiency   C. difficile colitis -Pt has been suffering frequent loose stools since November '17 with transient periods of improvement -She has hx of microscopic colitis and is followed by GI; GI consulting and much appreciated, will follow-up recs  -Although there is no fever or leukocytosis, GI panel came back positive for C. Difficile on 4/11  -Diarrhea improved with prune oral vancomycin, discharge complete total 14 days of oral vancomycin.  Renal insufficiency  - SCr 1.66 on admission, up from 1.52 two weeks prior; was <1 in September '17 but cannot rule out chronic renal disease. - Pt is clinically dehydrated and reports very poor recent oral intake and a prerenal etiology suspected  - IV fluids discontinued,  creatinine on discharge is 1.9, follow with PCP. As mentioned above no recent baseline.  CAD  - No anginal complaints  - Continue ASA 81, Imdur, and Toprol-XL as tolerated   Hypertension  - BP at goal  - Continue Norvasc and Toprol as tolerated   OSA - Stable, tolerating CPAP at home, will continue   Macrocytic B12 deficiency Anemia -Hemoglobin is 10.7, B12 is less than 50, received IM B12. -On discharge B12 1000 g daily. B12 deficiency < 50, administer IM B12.   Discharge Instructions  Discharge Instructions    Diet - low sodium heart healthy    Complete by:  As directed    Increase activity slowly    Complete by:  As directed      Allergies as of 10/17/2016      Reactions   Brilinta [ticagrelor] Shortness Of Breath, Other (See Comments), Cough   Reaction:  Fatigue   Lisinopril Shortness Of Breath, Cough   Statins Other (See Comments)   Reaction:  Muscle pain/weakness   Zetia [ezetimibe] Other (See Comments)   Reaction:  Muscle pain/weakness    Macrodantin [nitrofurantoin Macrocrystal] Rash   Sulfa Antibiotics Rash      Medication List    TAKE these medications   amLODipine 5 MG tablet Commonly known as:  NORVASC TAKE 1 TABLET DAILY   aspirin EC 81 MG tablet Take 81 mg by mouth daily.   bismuth subsalicylate 818 MG chewable tablet Commonly known as:  PEPTO BISMOL Chew 786 mg by mouth 3 (three) times daily as needed for indigestion or diarrhea or loose stools.   famotidine 40 MG tablet Commonly known as:  PEPCID Take 40 mg by mouth daily.   isosorbide mononitrate  60 MG 24 hr tablet Commonly known as:  IMDUR TAKE ONE TABLET AT BEDTIME   nitroGLYCERIN 0.4 MG SL tablet Commonly known as:  NITROSTAT Place 1 tablet (0.4 mg total) under the tongue every 5 (five) minutes x 3 doses as needed for chest pain.   TOPROL XL 50 MG 24 hr tablet Generic drug:  metoprolol succinate Take 1 tablet (50 mg total) by mouth daily. Take with or immediately following  a meal.   vancomycin 125 MG capsule Commonly known as:  VANCOCIN HCL Take 1 capsule (125 mg total) by mouth 4 (four) times daily.   Vitamin B12 1000 MCG Tbcr Take 1 tablet by mouth daily.      Follow-up Information    Amy Trellis Paganini, PA-C. Go on 11/04/2016.   Specialty:  Gastroenterology Why:  Please arrive at 10:45 for your appt with Amy [place], PA-C. Contact information: [place] [address] [phone]          Allergies  Allergen Reactions  . Brilinta [Ticagrelor] Shortness Of Breath, Other (See Comments) and Cough    Reaction:  Fatigue  . Lisinopril Shortness Of Breath and Cough  . Statins Other (See Comments)    Reaction:  Muscle pain/weakness  . Zetia [Ezetimibe] Other (See Comments)    Reaction:  Muscle pain/weakness   . Macrodantin [Nitrofurantoin Macrocrystal] Rash  . Sulfa Antibiotics Rash    Consultations: Treatment Team:  Milus Banister, MD    Procedures (Echo, Carotid, EGD, Colonoscopy, ERCP)   Radiological studies: No results found.  Subjective:  Discharge Exam: Vitals:   10/16/16 1505 10/16/16 2034 10/17/16 0138 10/17/16 0358  BP: 125/70 (!) 130/57  135/90  Pulse: 69 66  78  Resp: 16 18  18   Temp: 98.2 F (36.8 C) 98.5 F (36.9 C)  98 F (36.7 C)  TempSrc: Oral Oral  Oral  SpO2: 99% 99%  99%  Weight:   83.2 kg (183 lb 6.8 oz)   Height:       General: Pt is alert, awake, not in acute distress Cardiovascular: RRR, S1/S2 +, no rubs, no gallops Respiratory: CTA bilaterally, no wheezing, no rhonchi Abdominal: Soft, NT, ND, bowel sounds + Extremities: no edema, no cyanosis   The results of significant diagnostics from this hospitalization (including imaging, microbiology, ancillary and laboratory) are listed below for reference.    Microbiology: Recent Results (from the past 240 hour(s))  C difficile quick scan w PCR reflex     Status: Abnormal   Collection Time: 10/14/16  7:02 PM  Result Value Ref Range Status    C Diff antigen POSITIVE (A) NEGATIVE Final   C Diff toxin NEGATIVE NEGATIVE Final   C Diff interpretation Results are indeterminate. See PCR results.  Final  Gastrointestinal Panel by PCR , Stool     Status: None   Collection Time: 10/14/16  7:02 PM  Result Value Ref Range Status   Campylobacter species NOT DETECTED NOT DETECTED Final   Plesimonas shigelloides NOT DETECTED NOT DETECTED Final   Salmonella species NOT DETECTED NOT DETECTED Final   Yersinia enterocolitica NOT DETECTED NOT DETECTED Final   Vibrio species NOT DETECTED NOT DETECTED Final   Vibrio cholerae NOT DETECTED NOT DETECTED Final   Enteroaggregative E coli (EAEC) NOT DETECTED NOT DETECTED Final   Enteropathogenic E coli (EPEC) NOT DETECTED NOT DETECTED Final   Enterotoxigenic E coli (ETEC) NOT DETECTED NOT DETECTED Final   Shiga like toxin producing E coli (STEC) NOT DETECTED NOT DETECTED  Final   Shigella/Enteroinvasive E coli (EIEC) NOT DETECTED NOT DETECTED Final   Cryptosporidium NOT DETECTED NOT DETECTED Final   Cyclospora cayetanensis NOT DETECTED NOT DETECTED Final   Entamoeba histolytica NOT DETECTED NOT DETECTED Final   Giardia lamblia NOT DETECTED NOT DETECTED Final   Adenovirus F40/41 NOT DETECTED NOT DETECTED Final   Astrovirus NOT DETECTED NOT DETECTED Final   Norovirus GI/GII NOT DETECTED NOT DETECTED Final   Rotavirus A NOT DETECTED NOT DETECTED Final   Sapovirus (I, II, IV, and V) NOT DETECTED NOT DETECTED Final  Clostridium Difficile by PCR     Status: Abnormal   Collection Time: 10/14/16  7:02 PM  Result Value Ref Range Status   Toxigenic C Difficile by pcr POSITIVE (A) NEGATIVE Final    Comment: Positive for toxigenic C. difficile with little to no toxin production. Only treat if clinical presentation suggests symptomatic illness. Performed at [facility], [place] [address] [place], [place] [address]      Labs: BNP (last 3 results) No results for input(s): BNP in the last 8760  hours. Basic Metabolic Panel:  Recent Labs Lab 10/14/16 1614 10/15/16 0325 10/16/16 0410 10/17/16 0404  NA 138 139 140 140  K 3.5 3.6 4.1 4.4  CL 105 109 108 108  CO2 25 24 24 26   GLUCOSE 114* 115* 111* 111*  BUN 18 17 14 16   CREATININE 1.66* 1.52* 1.50* 1.95*  CALCIUM 9.8 8.7* 8.8* 9.0  MG  --   --  2.3  --    Liver Function Tests:  Recent Labs Lab 10/14/16 1614  AST 15  ALT 12  ALKPHOS 78  BILITOT 0.4  PROT 7.9  ALBUMIN 4.1   No results for input(s): LIPASE, AMYLASE in the last 168 hours. No results for input(s): AMMONIA in the last 168 hours. CBC:  Recent Labs Lab 10/14/16 1614 10/15/16 0325 10/16/16 0410  WBC 9.8 6.8 6.4  NEUTROABS 5.6 3.4  --   HGB 12.9 10.5* 10.7*  HCT 37.3 30.7* 30.3*  MCV 100.9* 100.7* 98.4  PLT 407.0* 303 343   Cardiac Enzymes: No results for input(s): CKTOTAL, CKMB, CKMBINDEX, TROPONINI in the last 168 hours. BNP: Invalid input(s): POCBNP CBG:  Recent Labs Lab 10/17/16 0752  GLUCAP 108*   D-Dimer No results for input(s): DDIMER in the last 72 hours. Hgb A1c No results for input(s): HGBA1C in the last 72 hours. Lipid Profile No results for input(s): CHOL, HDL, LDLCALC, TRIG, CHOLHDL, LDLDIRECT in the last 72 hours. Thyroid function studies  Recent Labs  10/15/16 1313  TSH 5.423*   Anemia work up  Recent Labs  10/15/16 1313  VITAMINB12 <50*  FOLATE 21.8   Urinalysis No results found for: COLORURINE, APPEARANCEUR, LABSPEC, [place], GLUCOSEU, HGBUR, BILIRUBINUR, KETONESUR, PROTEINUR, UROBILINOGEN, NITRITE, LEUKOCYTESUR Sepsis Labs Invalid input(s): PROCALCITONIN,  WBC,  LACTICIDVEN Microbiology Recent Results (from the past 240 hour(s))  C difficile quick scan w PCR reflex     Status: Abnormal   Collection Time: 10/14/16  7:02 PM  Result Value Ref Range Status   C Diff antigen POSITIVE (A) NEGATIVE Final   C Diff toxin NEGATIVE NEGATIVE Final   C Diff interpretation Results are indeterminate. See PCR  results.  Final  Gastrointestinal Panel by PCR , Stool     Status: None   Collection Time: 10/14/16  7:02 PM  Result Value Ref Range Status   Campylobacter species NOT DETECTED NOT DETECTED Final   Plesimonas shigelloides NOT DETECTED NOT DETECTED Final  Salmonella species NOT DETECTED NOT DETECTED Final   Yersinia enterocolitica NOT DETECTED NOT DETECTED Final   Vibrio species NOT DETECTED NOT DETECTED Final   Vibrio cholerae NOT DETECTED NOT DETECTED Final   Enteroaggregative E coli (EAEC) NOT DETECTED NOT DETECTED Final   Enteropathogenic E coli (EPEC) NOT DETECTED NOT DETECTED Final   Enterotoxigenic E coli (ETEC) NOT DETECTED NOT DETECTED Final   Shiga like toxin producing E coli (STEC) NOT DETECTED NOT DETECTED Final   Shigella/Enteroinvasive E coli (EIEC) NOT DETECTED NOT DETECTED Final   Cryptosporidium NOT DETECTED NOT DETECTED Final   Cyclospora cayetanensis NOT DETECTED NOT DETECTED Final   Entamoeba histolytica NOT DETECTED NOT DETECTED Final   Giardia lamblia NOT DETECTED NOT DETECTED Final   Adenovirus F40/41 NOT DETECTED NOT DETECTED Final   Astrovirus NOT DETECTED NOT DETECTED Final   Norovirus GI/GII NOT DETECTED NOT DETECTED Final   Rotavirus A NOT DETECTED NOT DETECTED Final   Sapovirus (I, II, IV, and V) NOT DETECTED NOT DETECTED Final  Clostridium Difficile by PCR     Status: Abnormal   Collection Time: 10/14/16  7:02 PM  Result Value Ref Range Status   Toxigenic C Difficile by pcr POSITIVE (A) NEGATIVE Final    Comment: Positive for toxigenic C. difficile with little to no toxin production. Only treat if clinical presentation suggests symptomatic illness. Performed at [facility], [place] [address] [place], [place] [address]      Time coordinating discharge: Over 30 minutes  SIGNED:   Birdie Hopes, MD  [facility] 10/17/2016, 1:00 PM Pager   If 7PM-7AM, please contact night-coverage [email or web address] Password [email or web address]

## 2016-10-17 NOTE — Telephone Encounter (Signed)
New Message     Pt would like to come in at the same time as her husband on 12/23/16 , she states that [place] usually double books them ?? Please call

## 2016-10-17 NOTE — Progress Notes (Signed)
    Progress Note   Subjective  Chief Complaint: Diarrhea  This morning the pt reports continuing to tolerate heart healthy diet and that her BM have started to have "a little bit of form", she has also noticed some "undigested" food in her BM's, which are now usually after she eats three times a day, but "not urgently after".  Overall she is feeling much better and ready for discharge today. No further abdominal pain.   Objective   Vital signs in last 24 hours: Temp:  [98 F (36.7 C)-98.5 F (36.9 C)] 98 F (36.7 C) (04/13 0358) Pulse Rate:  [66-78] 78 (04/13 0358) Resp:  [16-18] 18 (04/13 0358) BP: (125-135)/(57-90) 135/90 (04/13 0358) SpO2:  [99 %] 99 % (04/13 0358) Weight:  [183 lb 6.8 oz (83.2 kg)] 183 lb 6.8 oz (83.2 kg) (04/13 0138) Last BM Date: 10/16/16 General:Caucasian female in NAD Heart:  Regular rate and rhythm; no murmurs Lungs: Respirations even and unlabored, lungs CTA bilaterally Abdomen:  Soft, nontender and nondistended. Normal bowel sounds. Extremities:  Without edema. Neurologic:  Alert and oriented,  grossly normal neurologically. Psych:  Cooperative. Normal mood and affect.  Intake/Output from previous day: 04/12 0701 - 04/13 0700 In: 840 [P.O.:840] Out: -   Lab Results:  Recent Labs  10/14/16 1614 10/15/16 0325 10/16/16 0410  WBC 9.8 6.8 6.4  HGB 12.9 10.5* 10.7*  HCT 37.3 30.7* 30.3*  PLT 407.0* 303 343   BMET  Recent Labs  10/15/16 0325 10/16/16 0410 10/17/16 0404  NA 139 140 140  K 3.6 4.1 4.4  CL 109 108 108  CO2 24 24 26   GLUCOSE 115* 111* 111*  BUN 17 14 16   CREATININE 1.52* 1.50* 1.95*  CALCIUM 8.7* 8.8* 9.0   LFT  Recent Labs  10/14/16 1614  PROT 7.9  ALBUMIN 4.1  AST 15  ALT 12  ALKPHOS 78  BILITOT 0.4     Assessment / Plan:    Assessment: 1. C Diff Diarrhea: h/o diarrhea waxing and waning since Nov, empiric tx with 2 rounds of Flagyl, high dose pepto bismol and negative stool pathogen panel, empiric tx  with Budesonide for remote h/o lymphocytic colitis, plans were for EGD/Colo but pt found to be + for Cdiff, started on oral vanc 10/15/16, continues to improve  Plan: 1. Continue PO Vancomycin for a total of 2 weeks 2. Follow up appt has been arranged for the patient with Nicoletta Ba, PA-C on May 3rd at 11:00am 3. Agree with discharge today, patient requests prescriptions be sent to [facility] on [facility] 4. We will sign off  Thank you for your kind consultation.   LOS: 3 days   Lavone Nian [facility]  10/17/2016, 9:03 AM  Pager # [phone]

## 2016-10-17 NOTE — Telephone Encounter (Signed)
Returned call to patient appointment scheduled with Dr.Jordan 12/23/16 at 2:00 pm.

## 2016-11-05 ENCOUNTER — Observation Stay (HOSPITAL_COMMUNITY)
Admission: EM | Admit: 2016-11-05 | Discharge: 2016-11-07 | Disposition: A | Discharge status: Home / Self Care | Payer: Medicare Other | Attending: Cardiovascular Disease | Admitting: Cardiovascular Disease

## 2016-11-05 ENCOUNTER — Encounter (HOSPITAL_COMMUNITY): Payer: Self-pay | Admitting: General Practice

## 2016-11-05 ENCOUNTER — Emergency Department (HOSPITAL_COMMUNITY): Payer: Medicare Other

## 2016-11-05 DIAGNOSIS — R0789 Other chest pain: Secondary | ICD-10-CM | POA: Diagnosis not present

## 2016-11-05 DIAGNOSIS — I25110 Atherosclerotic heart disease of native coronary artery with unstable angina pectoris: Secondary | ICD-10-CM

## 2016-11-05 DIAGNOSIS — I2510 Atherosclerotic heart disease of native coronary artery without angina pectoris: Secondary | ICD-10-CM | POA: Diagnosis not present

## 2016-11-05 DIAGNOSIS — Z955 Presence of coronary angioplasty implant and graft: Secondary | ICD-10-CM | POA: Insufficient documentation

## 2016-11-05 DIAGNOSIS — R079 Chest pain, unspecified: Secondary | ICD-10-CM | POA: Diagnosis not present

## 2016-11-05 DIAGNOSIS — I10 Essential (primary) hypertension: Secondary | ICD-10-CM | POA: Insufficient documentation

## 2016-11-05 DIAGNOSIS — R0602 Shortness of breath: Secondary | ICD-10-CM | POA: Diagnosis not present

## 2016-11-05 DIAGNOSIS — R002 Palpitations: Secondary | ICD-10-CM

## 2016-11-05 DIAGNOSIS — I495 Sick sinus syndrome: Secondary | ICD-10-CM

## 2016-11-05 DIAGNOSIS — Z7982 Long term (current) use of aspirin: Secondary | ICD-10-CM | POA: Diagnosis not present

## 2016-11-05 DIAGNOSIS — I252 Old myocardial infarction: Secondary | ICD-10-CM | POA: Diagnosis not present

## 2016-11-05 DIAGNOSIS — R061 Stridor: Secondary | ICD-10-CM | POA: Diagnosis not present

## 2016-11-05 LAB — URINALYSIS, ROUTINE W REFLEX MICROSCOPIC
Bacteria, UA: NONE SEEN
Bilirubin Urine: NEGATIVE
GLUCOSE, UA: NEGATIVE mg/dL
KETONES UR: NEGATIVE mg/dL
NITRITE: NEGATIVE
PH: 5 (ref 5.0–8.0)
Protein, ur: 100 mg/dL — AB
Specific Gravity, Urine: 1.012 (ref 1.005–1.030)

## 2016-11-05 LAB — COMPREHENSIVE METABOLIC PANEL
ALBUMIN: 3.3 g/dL — AB (ref 3.5–5.0)
ALK PHOS: 61 U/L (ref 38–126)
ALT: 15 U/L (ref 14–54)
ANION GAP: 11 (ref 5–15)
AST: 24 U/L (ref 15–41)
BUN: 19 mg/dL (ref 6–20)
CALCIUM: 9 mg/dL (ref 8.9–10.3)
CHLORIDE: 104 mmol/L (ref 101–111)
CO2: 25 mmol/L (ref 22–32)
Creatinine, Ser: 1.68 mg/dL — ABNORMAL HIGH (ref 0.44–1.00)
GFR calc non Af Amer: 29 mL/min — ABNORMAL LOW (ref >60)
GFR, EST AFRICAN AMERICAN: 34 mL/min — AB (ref >60)
GLUCOSE: 135 mg/dL — AB (ref 65–99)
POTASSIUM: 3.5 mmol/L (ref 3.5–5.1)
SODIUM: 140 mmol/L (ref 135–145)
Total Bilirubin: 0.9 mg/dL (ref 0.3–1.2)
Total Protein: 7 g/dL (ref 6.5–8.1)

## 2016-11-05 LAB — PROTIME-INR
INR: 1.04
Prothrombin Time: 13.6 seconds (ref 11.4–15.2)

## 2016-11-05 LAB — CBC
HCT: 31.9 % — ABNORMAL LOW (ref 36.0–46.0)
HEMATOCRIT: 31.6 % — AB (ref 36.0–46.0)
Hemoglobin: 10.6 g/dL — ABNORMAL LOW (ref 12.0–15.0)
Hemoglobin: 10.6 g/dL — ABNORMAL LOW (ref 12.0–15.0)
MCH: 32.9 pg (ref 26.0–34.0)
MCH: 33.2 pg (ref 26.0–34.0)
MCHC: 33.2 g/dL (ref 30.0–36.0)
MCHC: 33.5 g/dL (ref 30.0–36.0)
MCV: 99.1 fL (ref 78.0–100.0)
MCV: 99.1 fL (ref 78.0–100.0)
PLATELETS: 348 10*3/uL (ref 150–400)
PLATELETS: 328 10*3/uL (ref 150–400)
RBC: 3.22 MIL/uL — ABNORMAL LOW (ref 3.87–5.11)
RBC: 3.19 MIL/uL — ABNORMAL LOW (ref 3.87–5.11)
RDW: 12.2 % (ref 11.5–15.5)
RDW: 12.5 % (ref 11.5–15.5)
WBC: 8.9 10*3/uL (ref 4.0–10.5)
WBC: 7.9 10*3/uL (ref 4.0–10.5)

## 2016-11-05 LAB — HEPATIC FUNCTION PANEL
ALK PHOS: 62 U/L (ref 38–126)
ALT: 16 U/L (ref 14–54)
AST: 21 U/L (ref 15–41)
Albumin: 3.5 g/dL (ref 3.5–5.0)
BILIRUBIN DIRECT: 0.1 mg/dL (ref 0.1–0.5)
BILIRUBIN TOTAL: 0.6 mg/dL (ref 0.3–1.2)
Indirect Bilirubin: 0.5 mg/dL (ref 0.3–0.9)
Total Protein: 6.7 g/dL (ref 6.5–8.1)

## 2016-11-05 LAB — BRAIN NATRIURETIC PEPTIDE: B Natriuretic Peptide: 65.3 pg/mL (ref 0.0–100.0)

## 2016-11-05 LAB — LIPASE, BLOOD: LIPASE: 33 U/L (ref 11–51)

## 2016-11-05 LAB — I-STAT TROPONIN, ED
TROPONIN I, POC: 0 ng/mL (ref 0.00–0.08)
TROPONIN I, POC: 0 ng/mL (ref 0.00–0.08)

## 2016-11-05 LAB — CREATININE, SERUM
Creatinine, Ser: 1.62 mg/dL — ABNORMAL HIGH (ref 0.44–1.00)
GFR calc Af Amer: 35 mL/min — ABNORMAL LOW (ref >60)
GFR calc non Af Amer: 30 mL/min — ABNORMAL LOW (ref >60)

## 2016-11-05 LAB — TROPONIN I
Troponin I: <0.03 ng/mL (ref <0.03)
Troponin I: <0.03 ng/mL (ref <0.03)

## 2016-11-05 MED ORDER — AMLODIPINE BESYLATE 5 MG PO TABS
5.0000 mg | ORAL_TABLET | Freq: Every day | ORAL | Status: DC
  Administered 2016-11-05 – 2016-11-07 (×3): 5 mg via ORAL
  Filled 2016-11-05 (×3): qty 1

## 2016-11-05 MED ORDER — ASPIRIN EC 81 MG PO TBEC: 81.0000 mg | DELAYED_RELEASE_TABLET | Freq: Every day | ORAL | Status: DC

## 2016-11-05 MED ORDER — SODIUM CHLORIDE 0.9% FLUSH
3.0000 mL | Freq: Two times a day (BID) | INTRAVENOUS | Status: DC
  Administered 2016-11-05 – 2016-11-06 (×3): 3 mL via INTRAVENOUS

## 2016-11-05 MED ORDER — SODIUM CHLORIDE 0.9 % IV SOLN: 250.0000 mL | INTRAVENOUS | Status: DC | PRN

## 2016-11-05 MED ORDER — SODIUM CHLORIDE 0.9% FLUSH: 3.0000 mL | INTRAVENOUS | Status: DC | PRN

## 2016-11-05 MED ORDER — SODIUM CHLORIDE 0.9 % IV BOLUS (SEPSIS)
1000.0000 mL | Freq: Once | INTRAVENOUS | Status: AC
  Administered 2016-11-05: 1000 mL via INTRAVENOUS

## 2016-11-05 MED ORDER — HEPARIN SODIUM (PORCINE) 5000 UNIT/ML IJ SOLN
5000.0000 [IU] | Freq: Three times a day (TID) | INTRAMUSCULAR | Status: DC
  Filled 2016-11-05: qty 1

## 2016-11-05 MED ORDER — ISOSORBIDE MONONITRATE ER 60 MG PO TB24
60.0000 mg | ORAL_TABLET | Freq: Every day | ORAL | Status: DC
  Administered 2016-11-05 – 2016-11-06 (×2): 60 mg via ORAL
  Filled 2016-11-05 (×2): qty 1

## 2016-11-05 MED ORDER — NITROGLYCERIN 0.4 MG SL SUBL: 0.4000 mg | SUBLINGUAL_TABLET | SUBLINGUAL | Status: DC | PRN

## 2016-11-05 MED ORDER — ASPIRIN EC 81 MG PO TBEC
81.0000 mg | DELAYED_RELEASE_TABLET | Freq: Every day | ORAL | Status: DC
  Administered 2016-11-06 – 2016-11-07 (×2): 81 mg via ORAL
  Filled 2016-11-05 (×2): qty 1

## 2016-11-05 MED ORDER — ONDANSETRON HCL 4 MG/2ML IJ SOLN: 4.0000 mg | Freq: Four times a day (QID) | INTRAMUSCULAR | Status: DC | PRN

## 2016-11-05 MED ORDER — ACETAMINOPHEN 325 MG PO TABS: 650.0000 mg | ORAL_TABLET | ORAL | Status: DC | PRN

## 2016-11-05 MED ORDER — METOPROLOL SUCCINATE ER 50 MG PO TB24
50.0000 mg | ORAL_TABLET | Freq: Every day | ORAL | Status: DC
  Administered 2016-11-05 – 2016-11-06 (×2): 50 mg via ORAL
  Filled 2016-11-05 (×2): qty 1

## 2016-11-05 NOTE — ED Notes (Signed)
Patient ambulating to BR for urine sample.

## 2016-11-05 NOTE — H&P (Signed)
Cardiology History & Physical    Patient ID: LADELLE TEODORO MRN: [id number], DOB: October 15, 1940 Date of Encounter: 11/05/2016, 10:15 AM Primary Physician: Geoffery Lyons, MD Primary Cardiologist: Dr. Martinique, MD  Chief Complaint: chest pain Reason for Admission: chest pain Requesting MD: Dr. Regenia Skeeter  HPI: DERYL PORTS is a 76 y.o. female with history of s/p non-ST elevation myocardial infarction in October 2013. She had stenting in the proximal and mid LAD with a 3.0 x 16 mm and 2.5 x 28 mm Promus stents respectively. In January 2014 she presented with some recurrent symptoms then repeat cardiac catheterization showed continued patency. In September 2014 she had a Myoview study suggesting some apical ischemia. Repeat cardiac catheterization continued to show stent patency at that time.  She was admitted in December 2015 with chest pain, cough, and dyspnea.  Right and left heart cath essentially normal- stents patent. Normal pulmonary pressures. Echo was normal at that time. Marland Kitchen  She was seen at the end of August 2017 with atypical chest pain. Myvoview study was normal. Echo showed mild LVH-otherwise normal.  She also has history of hypothyroidism, hypertension, arthritis, dyspnea, hyperlipidemia, myocardial infarction, sleep apnea (CPAP)  She presented to the emergency department today for left sided chest pain that started around 2am this morning. It is sharp and awoke her from sleep, radiates to left shoulder and arm. She took 1 SL nitro and did have some relief. She had associated heart palpitations and SOB lasting 1 hour and resolved on its own. Used her husbands pulse ox and her pulse jumped from 100 up to 150s. She was able to calm herself down and her pulse returned to normal. The CP returned again at 5 am this morning and felt like the CP she had during her MI. She called EMS. They administered 324 mg ASA and another nitro. Nitro did provide some relief. So far troponin x 2 is  negative, K 2.5, Hgb 10.6, platelets 348, Na 140, Cr 1.68 (baseline 1.5-1.6), negative chest xray, EKG shows HR 70, sinus rhythm nonspecific interventricular conduction delay- no ischemic process.  She is currently pain free. She takes Norvasc 5 mg daily, Aspirin 81 mg, Imdur 60 mg daily, Toprol 50 mg daily, nitro SL PRN.  Past Medical History:  Diagnosis Date  . Arthritis   . Borderline hypothyroidism   . CAD (coronary artery disease)    a. 04/2012 NSTEMI/Cath:  pLAD 90%, mLAD 70-80% (long) (3x16 & 2.5x28 Promus DES to p/m LAD), pD1 70-80%, pRI 30% (small), CFX 20%, OM1 40%, mOM2 30%, pRCA 30%, PDA 30-40%, pPLB 70%, then mid 90%, EF 65%;  b. 03/2013 Abnl CL w/apical isch;  c. 04/2013 Cath: LM nl, LAD patent stents, LCX <20, RCA 30p PDA 40-50 EF 55-60% ->Cont Med Rx.  c. CP s/p LHC with stable dz and patent stents--> Rx medically   . Cataract   . Dyspnea    a. CP and SOB 06/2012 => Ticagrelor changed to Plavix  . HLD (hyperlipidemia)    intolerant to statins  . Hx of adenomatous colonic polyps   . Hx of echocardiogram    a. Echo 2/14:  mild LVH, EF 55-65%, Gr 1 diast dysfn, mild LAE  . Hypertension   . Idiopathic pulmonary fibrosis   . Internal hemorrhoids   . Lung nodules   . Microscopic colitis   . Myocardial infarction ([place]) 2013  . Obesity   . OSA (obstructive sleep apnea)    a. on CPAP  . Sleep  apnea    cpap     Surgical History:  Past Surgical History:  Procedure Laterality Date  . APPENDECTOMY  1994  . BREAST BIOPSY  1965, 1975   bilateral  . CARDIAC CATHETERIZATION     x4  . CHOLECYSTECTOMY  2004  . CORONARY STENT PLACEMENT  04/2012   x2  . KNEE ARTHROSCOPY  2000   bilateral  . LEFT AND RIGHT HEART CATHETERIZATION WITH CORONARY ANGIOGRAM N/A 08/02/2014   Procedure: LEFT AND RIGHT HEART CATHETERIZATION WITH CORONARY ANGIOGRAM;  Surgeon: Burnell Blanks, MD;  Location: [facility] CATH LAB;  Service: Cardiovascular;  Laterality: N/A;  . LEFT HEART CATHETERIZATION WITH  CORONARY ANGIOGRAM N/A 04/29/2012   Procedure: LEFT HEART CATHETERIZATION WITH CORONARY ANGIOGRAM;  Surgeon: Peter M Martinique, MD;  Location: [facility] CATH LAB;  Service: Cardiovascular;  Laterality: N/A;  . LEFT HEART CATHETERIZATION WITH CORONARY ANGIOGRAM N/A 08/03/2012   Procedure: LEFT HEART CATHETERIZATION WITH CORONARY ANGIOGRAM;  Surgeon: Burnell Blanks, MD;  Location: [facility] CATH LAB;  Service: Cardiovascular;  Laterality: N/A;  . LEFT HEART CATHETERIZATION WITH CORONARY ANGIOGRAM N/A 04/12/2013   Procedure: LEFT HEART CATHETERIZATION WITH CORONARY ANGIOGRAM;  Surgeon: Peter M Martinique, MD;  Location: [facility] CATH LAB;  Service: Cardiovascular;  Laterality: N/A;  . PERCUTANEOUS CORONARY STENT INTERVENTION (PCI-S) N/A 04/29/2012   Procedure: PERCUTANEOUS CORONARY STENT INTERVENTION (PCI-S);  Surgeon: Peter M Martinique, MD;  Location: [facility] CATH LAB;  Service: Cardiovascular;  Laterality: N/A;  . SHOULDER ARTHROSCOPY  2012   left  . vaginal polypectomy  2004  . VENTRAL HERNIA REPAIR  2005     Home Meds: Prior to Admission medications   Medication Sig Start Date End Date Taking? Authorizing Provider  amLODipine (NORVASC) 5 MG tablet TAKE 1 TABLET DAILY 02/14/16   Troy Sine, MD  aspirin EC 81 MG tablet Take 81 mg by mouth daily.    Historical Provider, MD  bismuth subsalicylate (PEPTO BISMOL) 262 MG chewable tablet Chew 786 mg by mouth 3 (three) times daily as needed for indigestion or diarrhea or loose stools.    Historical Provider, MD  Cyanocobalamin (VITAMIN B12) 1000 MCG TBCR Take 1 tablet by mouth daily. 10/17/16   Verlee Monte, MD  famotidine (PEPCID) 40 MG tablet Take 40 mg by mouth daily.    Historical Provider, MD  isosorbide mononitrate (IMDUR) 60 MG 24 hr tablet TAKE ONE TABLET AT BEDTIME 06/20/16   Peter M Martinique, MD  metoprolol succinate (TOPROL XL) 50 MG 24 hr tablet Take 1 tablet (50 mg total) by mouth daily. Take with or immediately following a meal.    Hillary Bow, MD    nitroGLYCERIN (NITROSTAT) 0.4 MG SL tablet Place 1 tablet (0.4 mg total) under the tongue every 5 (five) minutes x 3 doses as needed for chest pain. 08/03/14   Eileen Stanford, PA-C    Allergies:  Allergies  Allergen Reactions  . Brilinta [Ticagrelor] Shortness Of Breath, Other (See Comments) and Cough    Reaction:  Fatigue  . Lisinopril Shortness Of Breath and Cough  . Statins Other (See Comments)    Reaction:  Muscle pain/weakness  . Zetia [Ezetimibe] Other (See Comments)    Reaction:  Muscle pain/weakness   . Macrodantin [Nitrofurantoin Macrocrystal] Rash  . Sulfa Antibiotics Rash    Social History   Social History  . Marital status: Married    Spouse name: N/A  . Number of children: 3  . Years of education: N/A   Occupational History  .  Retired Therapist, sports Retired   Social History Main Topics  . Smoking status: Never Smoker  . Smokeless tobacco: Never Used  . Alcohol use No  . Drug use: No  . Sexual activity: Not on file   Other Topics Concern  . Not on file   Social History Narrative  . No narrative on file     Family History  Problem Relation Age of Onset  . Breast cancer Sister   . Prostate cancer Father   . Colon polyps Father   . Heart disease Father 30    CAD, died of ESRD  . Kidney disease Father   . Colon cancer Sister 46  . Heart disease Mother 25    Died acute MI  . Heart disease Brother 43    CABG  . Esophageal cancer Neg Hx   . Rectal cancer Neg Hx   . Stomach cancer Neg Hx     Review of Systems: General: negative for chills, fever, night sweats or weight changes.  Cardiovascular: negative for edema, orthopnea, palpitations, paroxysmal nocturnal dyspnea, shortness of breath or dyspnea on exertion Dermatological: negative for rash Respiratory: negative for cough or wheezing Urologic: negative for hematuria Abdominal: negative for nausea, vomiting, diarrhea, bright red blood per rectum, melena, or hematemesis Neurologic: negative for visual  changes, syncope, or dizziness All other systems reviewed and are otherwise negative except as noted above.  Labs:  Lab Results  Component Value Date   WBC 8.9 11/05/2016   HGB 10.6 (L) 11/05/2016   HCT 31.9 (L) 11/05/2016   MCV 99.1 11/05/2016   PLT 348 11/05/2016    Recent Labs Lab 11/05/16 0755  NA 140  K 3.5  CL 104  CO2 25  BUN 19  CREATININE 1.68*  CALCIUM 9.0  PROT 7.0  BILITOT 0.9  ALKPHOS 61  ALT 15  AST 24  GLUCOSE 135*   No results for input(s): CKTOTAL, CKMB, TROPONINI in the last 72 hours. Lab Results  Component Value Date   CHOL 165 04/29/2012   HDL 43 04/29/2012   LDLCALC 96 04/29/2012   TRIG 132 04/29/2012   Lab Results  Component Value Date   DDIMER 0.72 (H) 07/16/2013    Radiology/Studies:  Dg Chest 2 View  Result Date: 11/05/2016 CLINICAL DATA:  Chest pain, weakness, and dizziness. History of previous myocardial infarction. EXAM: CHEST  2 VIEW COMPARISON:  PA and lateral chest x-ray of May 23, 2015 FINDINGS: The lungs are adequately inflated. There is chronic mild elevation of the right hemidiaphragm. There is no alveolar infiltrate. There is no pleural effusion. The heart and pulmonary vascularity are normal. The mediastinum is normal in width. The bony thorax exhibits no acute abnormality. IMPRESSION: There is no pneumonia, CHF, nor other acute cardiopulmonary disease. Electronically Signed   By: David  Martinique M.D.   On: 11/05/2016 08:38   Myoview 03/21/16: Study Highlights    The left ventricular ejection fraction is normal (55-65%).  Nuclear stress EF: 62%.  Blood pressure demonstrated a hypertensive response to exercise.  T wave inversion was noted during stress in the II, aVF, V4, V5, V6, V3 and V2 leads.  The study is normal.  This is a low risk study.   Echo: 03/18/16: Study Conclusions  - Left ventricle: The cavity size was normal. Wall thickness was increased in a pattern of mild LVH. Systolic function was  normal. The estimated ejection fraction was in the range of 55% to 60%. Wall motion was normal; there were no regional  wall motion abnormalities. Doppler parameters are consistent with abnormal left ventricular relaxation (grade 1 diastolic dysfunction). - Left atrium: The atrium was mildly dilated.  Impressions:  - Normal LV systolic function; grade 1 diastolic dysfunction; mild LVH; mild LAE.   Wt Readings from Last 3 Encounters:  11/05/16 183 lb (83 kg)  10/17/16 183 lb 6.8 oz (83.2 kg)  10/14/16 201 lb 9.6 oz (91.4 kg)    EKG: EKG shows HR 70, sinus rhythm nonspecific interventricular conduction delay- no ischemic process.   Physical Exam: Blood pressure (!) 123/59, pulse 60, resp. rate 13, height 5\' 7"  (1.702 m), weight 183 lb (83 kg), SpO2 97 %. Body mass index is 28.66 kg/m. General: Well developed, well nourished, in no acute distress. Head: Normocephalic, atraumatic, sclera non-icteric, no xanthomas, nares are without discharge.  Neck: Negative for carotid bruits. JVD not elevated. Lungs: Clear bilaterally to auscultation without wheezes, rales, or rhonchi. Breathing is unlabored. Heart: RRR with S1 S2. No murmurs, rubs, or gallops appreciated. Abdomen: Soft, non-tender, non-distended with normoactive bowel sounds. No hepatomegaly. No rebound/guarding. No obvious abdominal masses. Msk:  Strength and tone appear normal for age. Extremities: No clubbing or cyanosis. No edema.  Distal pedal pulses are 2+ and equal bilaterally. Neuro: Alert and oriented X 3. No focal deficit. No facial asymmetry. Moves all extremities spontaneously. Psych:  Responds to questions appropriately with a normal affect.    Assessment and Plan   1. Coronary disease. Status post non-ST elevation myocardial infarction October 2013. Status post stenting of tandem lesions in the proximal and mid LAD. Repeat cardiac catheterization in January 2016 showed continued stent patency and normal  right heart pressures. Myoview in September was normal. Continue home medication: -- pt has had multiple negative catheterizations showing patent stents. Recommend continuing to cycle enzymes and admit patient to telemetry. Keep NPO after midnight, strict I/O's. Daily ASA. -- repeat EKG in the AM -- recommend Nuc stress test -- Would consider repeat cardiac catheterization if positive enzyme or Myoview, or if her pain reoccurs and is severe.  2. Dyslipidemia. She is intolerant to statins. Was on Zetia, does not appear to be on anything for lipids at this time. No recent lipid panel in system. -- check lipid panel  3. Idiopathic pulmonary fibrosis - has been  followed by pulmonary in the past.  4. Obstructive sleep apnea.  5. History of colitis  Signed, Linus Mako PA-C 11/05/2016, 10:15 AM   I have personally seen and examined this patient with Delos Haring, PA-C. I agree with the assessment and plan as outlined above. She is known to have CAD with 2 DES placed in the LAD in 2013. Several repeat caths since then with patent LAD stents and non-obstructive disease elsewhere. She does have a very small caliber apical LAD that is diffusely diseased and too small for PCI. She is now presenting to the ED with c/o chest pain and palpitations. She notes rapid heart rate at 2am, up to 158 bpm by pulse ox monitor. Then went to bed and awoke at 5:30 am with chest pain radiating down her left arm. Currently chest pain free.  My exam shows:  General: Well developed, well nourished, NAD  HEENT: OP clear, mucus membranes moist  SKIN: warm, dry. No rashes. Neuro: No focal deficits  Musculoskeletal: Muscle strength 5/5 all ext  Psychiatric: Mood and affect normal  Neck: No JVD, no carotid bruits, no thyromegaly, no lymphadenopathy.  Lungs:Clear bilaterally, no wheezes, rhonci, crackles Cardiovascular: Regular rate and  rhythm. No murmurs, gallops or rubs. Abdomen:Soft. Bowel sounds present.  Non-tender.  Extremities: No lower extremity edema. Pulses are 2 + in the bilateral DP/PT. I have personally reviewed her EKG which shows sinus with chronic T wave abn Labs reviewed. Troponin negative x 2. Creatinine 1.68.  Plan:  1. Chest pain with known CAD: No objective evidence of ischemia. Troponin negative x 2. EKG without ischemic changes. Will admit to tele. Follow serial troponin. Will likely need stress test if troponin negative.  2. Palpitations/Tachycardia: sinus here in the ED. Will follow on tele. May need outpt cardiac monitor on discharge.   Lauree Chandler 11/05/2016 12:57 PM

## 2016-11-05 NOTE — ED Provider Notes (Signed)
[facility] DEPT Provider Note   CSN: [id number] Arrival date & time: 11/05/16  8938     History   Chief Complaint Chief Complaint  Patient presents with  . Chest Pain    HPI Jody Ruiz is a 76 y.o. female.  Pt is a 76 y/o F with PMHx of CAD s/p prior stenting of the LAD (cardiac cath 07/2012, 04/2013, and 08/03/15), HTN, HLD, IPF who presents to ED for L sided chest pain, onset at 0200 today, describes as sharp, woke her up from sleep, with radiation to L shoulder and arm, took 1 SL nitro with moderate relief, with associated heart palpitations and shortness of breath, lasted approx 1 hr and resolved. Chest pain returned at 0500 today, similar in character, with radiation to L shoulder and arm, with associated heart palpitations and shortness of breath, resolved after another SL nitro and 324mg  ASA given via EMS. Cardiologist is Dr. Martinique. Denies fevers, chills, dizziness, vision or gait changes, cough, hemoptysis, abd pain, n/v/d, dysuria, hematuria, extremity weakness, LE edema or pain, or any additional concerns. No anticoag use.      Past Medical History:  Diagnosis Date  . Arthritis   . Borderline hypothyroidism   . CAD (coronary artery disease)    a. 04/2012 NSTEMI/Cath:  pLAD 90%, mLAD 70-80% (long) (3x16 & 2.5x28 Promus DES to p/m LAD), pD1 70-80%, pRI 30% (small), CFX 20%, OM1 40%, mOM2 30%, pRCA 30%, PDA 30-40%, pPLB 70%, then mid 90%, EF 65%;  b. 03/2013 Abnl CL w/apical isch;  c. 04/2013 Cath: LM nl, LAD patent stents, LCX <20, RCA 30p PDA 40-50 EF 55-60% ->Cont Med Rx.  c. CP s/p LHC with stable dz and patent stents--> Rx medically   . Cataract   . Dyspnea    a. CP and SOB 06/2012 => Ticagrelor changed to Plavix  . HLD (hyperlipidemia)    intolerant to statins  . Hx of adenomatous colonic polyps   . Hx of echocardiogram    a. Echo 2/14:  mild LVH, EF 55-65%, Gr 1 diast dysfn, mild LAE  . Hypertension   . Idiopathic pulmonary fibrosis   . Internal  hemorrhoids   . Lung nodules   . Microscopic colitis   . Myocardial infarction ([place]) 2013  . Obesity   . OSA (obstructive sleep apnea)    a. on CPAP  . Sleep apnea    cpap    Patient Active Problem List   Diagnosis Date Noted  . Chest pain 11/05/2016  . Renal insufficiency 10/16/2016  . B12 deficiency 10/16/2016  . Enteritis due to Clostridium difficile   . Diarrhea with dehydration 10/14/2016  . Microscopic colitis 10/02/2016  . Hx of adenomatous colonic polyps 10/02/2016  . Altered mental status   . CAD (coronary artery disease)   . Interstitial lung disease ([place])   . HLD (hyperlipidemia)   . Precordial pain 07/17/2013  . Midsternal chest pain 07/17/2013  . Pulmonary nodule 11/22/2012  . 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 OSA (obstructive sleep apnea) 10/05/2012  . IPF (idiopathic pulmonary fibrosis) ([place]) 08/26/2012  . Coronary atherosclerosis of native coronary artery 05/04/2012  . Hypertension   . Obesity     Past Surgical History:  Procedure Laterality Date  . APPENDECTOMY  1994  . BREAST BIOPSY  1965, 1975   bilateral  . CARDIAC CATHETERIZATION     x4  . CHOLECYSTECTOMY  2004  . CORONARY STENT PLACEMENT  04/2012   x2  . KNEE ARTHROSCOPY  2000   bilateral  . LEFT AND RIGHT HEART CATHETERIZATION WITH CORONARY ANGIOGRAM N/A 08/02/2014   Procedure: LEFT AND RIGHT HEART CATHETERIZATION WITH CORONARY ANGIOGRAM;  Surgeon: Burnell Blanks, MD;  Location: [facility] CATH LAB;  Service: Cardiovascular;  Laterality: N/A;  . LEFT HEART CATHETERIZATION WITH CORONARY ANGIOGRAM N/A 04/29/2012   Procedure: LEFT HEART CATHETERIZATION WITH CORONARY ANGIOGRAM;  Surgeon: Peter M Martinique, MD;  Location: [facility] CATH LAB;  Service: Cardiovascular;  Laterality: N/A;  . LEFT HEART CATHETERIZATION WITH CORONARY ANGIOGRAM N/A 08/03/2012   Procedure: LEFT HEART CATHETERIZATION WITH CORONARY ANGIOGRAM;  Surgeon: Burnell Blanks, MD;  Location: [facility] CATH LAB;  Service:  Cardiovascular;  Laterality: N/A;  . LEFT HEART CATHETERIZATION WITH CORONARY ANGIOGRAM N/A 04/12/2013   Procedure: LEFT HEART CATHETERIZATION WITH CORONARY ANGIOGRAM;  Surgeon: Peter M Martinique, MD;  Location: [facility] CATH LAB;  Service: Cardiovascular;  Laterality: N/A;  . PERCUTANEOUS CORONARY STENT INTERVENTION (PCI-S) N/A 04/29/2012   Procedure: PERCUTANEOUS CORONARY STENT INTERVENTION (PCI-S);  Surgeon: Peter M Martinique, MD;  Location: [facility] CATH LAB;  Service: Cardiovascular;  Laterality: N/A;  . SHOULDER ARTHROSCOPY  2012   left  . vaginal polypectomy  2004  . VENTRAL HERNIA REPAIR  2005    OB History    No data available       Home Medications    Prior to Admission medications   Medication Sig Start Date End Date Taking? Authorizing Provider  amLODipine (NORVASC) 5 MG tablet TAKE 1 TABLET DAILY 02/14/16  Yes Troy Sine, MD  aspirin EC 81 MG tablet Take 81 mg by mouth daily.   Yes Historical Provider, MD  Cyanocobalamin (VITAMIN B12) 1000 MCG TBCR Take 1 tablet by mouth daily. 10/17/16  Yes Verlee Monte, MD  isosorbide mononitrate (IMDUR) 60 MG 24 hr tablet TAKE ONE TABLET AT BEDTIME 06/20/16  Yes Peter M Martinique, MD  metoprolol succinate (TOPROL XL) 50 MG 24 hr tablet Take 1 tablet (50 mg total) by mouth daily. Take with or immediately following a meal.   Yes Hillary Bow, MD  nitroGLYCERIN (NITROSTAT) 0.4 MG SL tablet Place 1 tablet (0.4 mg total) under the tongue every 5 (five) minutes x 3 doses as needed for chest pain. 08/03/14  Yes Eileen Stanford, PA-C    Family History Family History  Problem Relation Age of Onset  . Breast cancer Sister   . Prostate cancer Father   . Colon polyps Father   . Heart disease Father 34    CAD, died of ESRD  . Kidney disease Father   . Colon cancer Sister 65  . Heart disease Mother 41    Died acute MI  . Heart disease Brother 41    CABG  . Esophageal cancer Neg Hx   . Rectal cancer Neg Hx   . Stomach cancer Neg Hx     Social  History Social History  Substance Use Topics  . Smoking status: Never Smoker  . Smokeless tobacco: Never Used  . Alcohol use No     Allergies   Brilinta [ticagrelor]; Lisinopril; Statins; Zetia [ezetimibe]; Macrodantin [nitrofurantoin macrocrystal]; and Sulfa antibiotics   Review of Systems Review of Systems  Constitutional: Negative for chills, fever and unexpected weight change.  Eyes: Negative for visual disturbance.  Respiratory: Positive for shortness of breath. Negative for cough.   Cardiovascular: Positive for chest pain and palpitations. Negative for leg swelling.  Gastrointestinal: Negative for abdominal pain, diarrhea, nausea and vomiting.  Genitourinary: Negative for dysuria, flank pain  and hematuria.  Musculoskeletal: Negative for back pain, neck pain and neck stiffness.  Skin: Negative for rash.  Neurological: Negative for dizziness, weakness and headaches.     Physical Exam Updated Vital Signs BP 133/63 (BP Location: Right Arm)   Pulse (!) 57   Temp 98.2 F (36.8 C) (Oral)   Resp 15   Ht 5\' 7"  (1.702 m)   Wt 92.1 kg   LMP  (LMP Unknown)   SpO2 97%   BMI 31.79 kg/m   Physical Exam  Constitutional: She is oriented to person, place, and time. She appears well-developed and well-nourished.  HENT:  Head: Normocephalic and atraumatic.  Mouth/Throat: Oropharynx is clear and moist.  Eyes: Conjunctivae and EOM are normal. Pupils are equal, round, and reactive to light.  Neck: Normal range of motion. Neck supple.  Cardiovascular: Normal heart sounds and intact distal pulses.  Bradycardia present.  Exam reveals no gallop and no friction rub.   No murmur heard. Pulmonary/Chest: Effort normal. She has no wheezes. She has rales (to RLL).  Abdominal: Soft. Bowel sounds are normal. There is no tenderness. There is no rebound and no guarding.  Musculoskeletal: Normal range of motion.  Neurological: She is alert and oriented to person, place, and time.  5/5 strength  to bilateral UE and LE. Speech clear, no facial droop.   Skin: Skin is warm and dry.  Psychiatric: She has a normal mood and affect. Her behavior is normal.  Nursing note and vitals reviewed.    ED Treatments / Results  Labs (all labs ordered are listed, but only abnormal results are displayed) Labs Reviewed  CBC - Abnormal; Notable for the following:       Result Value   RBC 3.22 (*)    Hemoglobin 10.6 (*)    HCT 31.9 (*)    All other components within normal limits  COMPREHENSIVE METABOLIC PANEL - Abnormal; Notable for the following:    Glucose, Bld 135 (*)    Creatinine, Ser 1.68 (*)    Albumin 3.3 (*)    GFR calc non Af Amer 29 (*)    GFR calc Af Amer 34 (*)    All other components within normal limits  URINALYSIS, ROUTINE W REFLEX MICROSCOPIC - Abnormal; Notable for the following:    APPearance HAZY (*)    Hgb urine dipstick LARGE (*)    Protein, ur 100 (*)    Leukocytes, UA SMALL (*)    Squamous Epithelial / LPF 0-5 (*)    All other components within normal limits  CBC - Abnormal; Notable for the following:    RBC 3.19 (*)    Hemoglobin 10.6 (*)    HCT 31.6 (*)    All other components within normal limits  CREATININE, SERUM - Abnormal; Notable for the following:    Creatinine, Ser 1.62 (*)    GFR calc non Af Amer 30 (*)    GFR calc Af Amer 35 (*)    All other components within normal limits  LIPID PANEL - Abnormal; Notable for the following:    Triglycerides 152 (*)    HDL 34 (*)    LDL Cholesterol 114 (*)    All other components within normal limits  BASIC METABOLIC PANEL - Abnormal; Notable for the following:    Potassium 3.3 (*)    Glucose, Bld 136 (*)    Creatinine, Ser 1.65 (*)    Calcium 8.5 (*)    GFR calc non Af Amer 30 (*)  GFR calc Af Amer 34 (*)    All other components within normal limits  CBC - Abnormal; Notable for the following:    RBC 3.14 (*)    Hemoglobin 10.2 (*)    HCT 31.1 (*)    All other components within normal limits  BRAIN  NATRIURETIC PEPTIDE  PROTIME-INR  LIPASE, BLOOD  HEPATIC FUNCTION PANEL  TROPONIN I  TROPONIN I  TROPONIN I  CBG MONITORING, ED  I-STAT TROPOININ, ED  I-STAT TROPOININ, ED    EKG EKG: NSR, no STEMI   Results for orders placed or performed during the hospital encounter of 11/05/16  NM Myocar Multi W/Spect W/Wall Motion / EF  Result Value Ref Range   Rest HR 58 bpm   Rest BP 151/73 mmHg   Peak HR 93 bpm   Peak BP 174/71 mmHg  CBC  Result Value Ref Range   WBC 8.9 4.0 - 10.5 K/uL   RBC 3.22 (L) 3.87 - 5.11 MIL/uL   Hemoglobin 10.6 (L) 12.0 - 15.0 g/dL   HCT 31.9 (L) 36.0 - 46.0 %   MCV 99.1 78.0 - 100.0 fL   MCH 32.9 26.0 - 34.0 pg   MCHC 33.2 30.0 - 36.0 g/dL   RDW 12.2 11.5 - 15.5 %   Platelets 348 150 - 400 K/uL  Comprehensive metabolic panel  Result Value Ref Range   Sodium 140 135 - 145 mmol/L   Potassium 3.5 3.5 - 5.1 mmol/L   Chloride 104 101 - 111 mmol/L   CO2 25 22 - 32 mmol/L   Glucose, Bld 135 (H) 65 - 99 mg/dL   BUN 19 6 - 20 mg/dL   Creatinine, Ser 1.68 (H) 0.44 - 1.00 mg/dL   Calcium 9.0 8.9 - 10.3 mg/dL   Total Protein 7.0 6.5 - 8.1 g/dL   Albumin 3.3 (L) 3.5 - 5.0 g/dL   AST 24 15 - 41 U/L   ALT 15 14 - 54 U/L   Alkaline Phosphatase 61 38 - 126 U/L   Total Bilirubin 0.9 0.3 - 1.2 mg/dL   GFR calc non Af Amer 29 (L) >60 mL/min   GFR calc Af Amer 34 (L) >60 mL/min   Anion gap 11 5 - 15  Brain natriuretic peptide (order if patient c/o SOB ONLY)  Result Value Ref Range   B Natriuretic Peptide 65.3 0.0 - 100.0 pg/mL  Urinalysis, Routine w reflex microscopic  Result Value Ref Range   Color, Urine YELLOW YELLOW   APPearance HAZY (A) CLEAR   Specific Gravity, Urine 1.012 1.005 - 1.030   pH 5.0 5.0 - 8.0   Glucose, UA NEGATIVE NEGATIVE mg/dL   Hgb urine dipstick LARGE (A) NEGATIVE   Bilirubin Urine NEGATIVE NEGATIVE   Ketones, ur NEGATIVE NEGATIVE mg/dL   Protein, ur 100 (A) NEGATIVE mg/dL   Nitrite NEGATIVE NEGATIVE   Leukocytes, UA SMALL (A)  NEGATIVE   RBC / HPF TOO NUMEROUS TO COUNT 0 - 5 RBC/hpf   WBC, UA TOO NUMEROUS TO COUNT 0 - 5 WBC/hpf   Bacteria, UA NONE SEEN NONE SEEN   Squamous Epithelial / LPF 0-5 (A) NONE SEEN   Mucous PRESENT    Budding Yeast PRESENT   Protime-INR  Result Value Ref Range   Prothrombin Time 13.6 11.4 - 15.2 seconds   INR 1.04   Lipase, blood  Result Value Ref Range   Lipase 33 11 - 51 U/L  CBC  Result Value Ref Range   WBC 7.9  4.0 - 10.5 K/uL   RBC 3.19 (L) 3.87 - 5.11 MIL/uL   Hemoglobin 10.6 (L) 12.0 - 15.0 g/dL   HCT 31.6 (L) 36.0 - 46.0 %   MCV 99.1 78.0 - 100.0 fL   MCH 33.2 26.0 - 34.0 pg   MCHC 33.5 30.0 - 36.0 g/dL   RDW 12.5 11.5 - 15.5 %   Platelets 328 150 - 400 K/uL  Creatinine, serum  Result Value Ref Range   Creatinine, Ser 1.62 (H) 0.44 - 1.00 mg/dL   GFR calc non Af Amer 30 (L) >60 mL/min   GFR calc Af Amer 35 (L) >60 mL/min  Hepatic function panel  Result Value Ref Range   Total Protein 6.7 6.5 - 8.1 g/dL   Albumin 3.5 3.5 - 5.0 g/dL   AST 21 15 - 41 U/L   ALT 16 14 - 54 U/L   Alkaline Phosphatase 62 38 - 126 U/L   Total Bilirubin 0.6 0.3 - 1.2 mg/dL   Bilirubin, Direct 0.1 0.1 - 0.5 mg/dL   Indirect Bilirubin 0.5 0.3 - 0.9 mg/dL  Troponin I  Result Value Ref Range   Troponin I <0.03 <0.03 ng/mL  Troponin I  Result Value Ref Range   Troponin I <0.03 <0.03 ng/mL  Troponin I  Result Value Ref Range   Troponin I <0.03 <0.03 ng/mL  Lipid panel  Result Value Ref Range   Cholesterol 178 0 - 200 mg/dL   Triglycerides 152 (H) <150 mg/dL   HDL 34 (L) >40 mg/dL   Total CHOL/HDL Ratio 5.2 RATIO   VLDL 30 0 - 40 mg/dL   LDL Cholesterol 114 (H) 0 - 99 mg/dL  Basic metabolic panel  Result Value Ref Range   Sodium 141 135 - 145 mmol/L   Potassium 3.3 (L) 3.5 - 5.1 mmol/L   Chloride 108 101 - 111 mmol/L   CO2 26 22 - 32 mmol/L   Glucose, Bld 136 (H) 65 - 99 mg/dL   BUN 18 6 - 20 mg/dL   Creatinine, Ser 1.65 (H) 0.44 - 1.00 mg/dL   Calcium 8.5 (L) 8.9 - 10.3  mg/dL   GFR calc non Af Amer 30 (L) >60 mL/min   GFR calc Af Amer 34 (L) >60 mL/min   Anion gap 7 5 - 15  CBC  Result Value Ref Range   WBC 8.9 4.0 - 10.5 K/uL   RBC 3.14 (L) 3.87 - 5.11 MIL/uL   Hemoglobin 10.2 (L) 12.0 - 15.0 g/dL   HCT 31.1 (L) 36.0 - 46.0 %   MCV 99.0 78.0 - 100.0 fL   MCH 32.5 26.0 - 34.0 pg   MCHC 32.8 30.0 - 36.0 g/dL   RDW 12.3 11.5 - 15.5 %   Platelets 334 150 - 400 K/uL  I-stat troponin, ED (0, 3)  Result Value Ref Range   Troponin i, poc 0.00 0.00 - 0.08 ng/mL   Comment 3          I-stat troponin, ED (0, 3)  Result Value Ref Range   Troponin i, poc 0.00 0.00 - 0.08 ng/mL   Comment 3            Procedures Procedures (including critical care time)   Initial Impression / Assessment and Plan / ED Course  I have reviewed the triage vital signs and the nursing notes.  Pertinent labs & imaging results that were available during my care of the patient were reviewed by me and considered in my  medical decision making (see chart for details).   Pt is a 76 y/o F who presents to ED for chest pain, concern for unstable angina. EKG without ST elevation. Doubt  Pulmonary Embolism - no hemoptysis, pain is not pleuritic, no recent surgery, no unilateral lower extremity edema, no recent travel or immobilization. Doubt Pericarditis,or pericardial effusion with tamponade - no fever, no frictional rub, no hypotension, no muffled heart sounds, no JVD. Doubt pneumothorax - breath sounds clear and equal bilaterally, no recent central line or traumatic injury. Doubt aortic dissection - pain is not ripping or tearing, not worst at onset, peripheral pulse strong and equal, no aortic regurgitation murmur. Plan to get labs for electrolyte abnormality, troponin and CXR for acute pulmonary process. Will discuss with cardiology. Likely admit.   9:19 AM Pt updated on results. Troponin negative, K 3.5, Cr 1.68, H/H 10.6/31.9. CXR negative for acute pulmonary process. Plan to admit.  Cardiology paged.  9:45 AM Spoke to [place], cardiology. Will have cardiology PA evaluate  12:40 PM Admitted to cardiology  The patient was discussed with and seen by Dr. Regenia Skeeter who agrees with the treatment plan.   Final Clinical Impressions(s) / ED Diagnoses   Final diagnoses:  Chest pain, unspecified type  Chest pain    New Prescriptions Current Discharge Medication List       Ulice Bold, NP 11/07/16 [place], NP 11/07/16 1497    Sherwood Gambler, MD 11/11/16 0127

## 2016-11-05 NOTE — ED Notes (Signed)
Attempted report x 1. Will call back

## 2016-11-05 NOTE — ED Triage Notes (Signed)
Patient comes in EMS with cp around 2am and it woke her up, went back to sleep and at 5am she woke up again with cp and numbness down left arm. Hx of MI, and similar symptoms in past. Hx of C. Diff. 324 mg aspirin. nitrox2. ekg unremarkable. V/s 136/77, 74 hr, 18 RR. Irregular at times. 20 LAC.

## 2016-11-05 NOTE — ED Notes (Signed)
Attempted report x 2 

## 2016-11-05 NOTE — Progress Notes (Signed)
2.56 sec pause reported to RN by central monitor tech.

## 2016-11-06 ENCOUNTER — Ambulatory Visit: Payer: Medicare Other | Admitting: Physician Assistant

## 2016-11-06 ENCOUNTER — Observation Stay (HOSPITAL_BASED_OUTPATIENT_CLINIC_OR_DEPARTMENT_OTHER): Payer: Medicare Other

## 2016-11-06 DIAGNOSIS — R072 Precordial pain: Secondary | ICD-10-CM

## 2016-11-06 DIAGNOSIS — R079 Chest pain, unspecified: Secondary | ICD-10-CM

## 2016-11-06 DIAGNOSIS — R001 Bradycardia, unspecified: Secondary | ICD-10-CM

## 2016-11-06 DIAGNOSIS — I10 Essential (primary) hypertension: Secondary | ICD-10-CM | POA: Diagnosis not present

## 2016-11-06 DIAGNOSIS — I2510 Atherosclerotic heart disease of native coronary artery without angina pectoris: Secondary | ICD-10-CM | POA: Diagnosis not present

## 2016-11-06 DIAGNOSIS — R0789 Other chest pain: Secondary | ICD-10-CM | POA: Diagnosis not present

## 2016-11-06 DIAGNOSIS — I252 Old myocardial infarction: Secondary | ICD-10-CM | POA: Diagnosis not present

## 2016-11-06 LAB — TROPONIN I: Troponin I: <0.03 ng/mL (ref <0.03)

## 2016-11-06 LAB — BASIC METABOLIC PANEL
Anion gap: 7 (ref 5–15)
BUN: 18 mg/dL (ref 6–20)
CHLORIDE: 108 mmol/L (ref 101–111)
CO2: 26 mmol/L (ref 22–32)
CREATININE: 1.65 mg/dL — AB (ref 0.44–1.00)
Calcium: 8.5 mg/dL — ABNORMAL LOW (ref 8.9–10.3)
GFR calc non Af Amer: 30 mL/min — ABNORMAL LOW (ref >60)
GFR, EST AFRICAN AMERICAN: 34 mL/min — AB (ref >60)
Glucose, Bld: 136 mg/dL — ABNORMAL HIGH (ref 65–99)
POTASSIUM: 3.3 mmol/L — AB (ref 3.5–5.1)
Sodium: 141 mmol/L (ref 135–145)

## 2016-11-06 LAB — LIPID PANEL
CHOL/HDL RATIO: 5.2 ratio
CHOLESTEROL: 178 mg/dL (ref 0–200)
HDL: 34 mg/dL — ABNORMAL LOW (ref >40)
LDL Cholesterol: 114 mg/dL — ABNORMAL HIGH (ref 0–99)
Triglycerides: 152 mg/dL — ABNORMAL HIGH (ref <150)
VLDL: 30 mg/dL (ref 0–40)

## 2016-11-06 LAB — CBC
HEMATOCRIT: 31.1 % — AB (ref 36.0–46.0)
HEMOGLOBIN: 10.2 g/dL — AB (ref 12.0–15.0)
MCH: 32.5 pg (ref 26.0–34.0)
MCHC: 32.8 g/dL (ref 30.0–36.0)
MCV: 99 fL (ref 78.0–100.0)
PLATELETS: 334 10*3/uL (ref 150–400)
RBC: 3.14 MIL/uL — AB (ref 3.87–5.11)
RDW: 12.3 % (ref 11.5–15.5)
WBC: 8.9 10*3/uL (ref 4.0–10.5)

## 2016-11-06 LAB — NM MYOCAR MULTI W/SPECT W/WALL MOTION / EF
CHL CUP RESTING HR STRESS: 58 {beats}/min
CSEPPHR: 93 {beats}/min

## 2016-11-06 MED ORDER — METOPROLOL SUCCINATE ER 25 MG PO TB24
25.0000 mg | ORAL_TABLET | Freq: Every day | ORAL | Status: DC
  Administered 2016-11-07: 25 mg via ORAL
  Filled 2016-11-06: qty 1

## 2016-11-06 MED ORDER — TECHNETIUM TC 99M TETROFOSMIN IV KIT
10.0000 | PACK | Freq: Once | INTRAVENOUS | Status: AC | PRN
  Administered 2016-11-06: 10 via INTRAVENOUS

## 2016-11-06 MED ORDER — REGADENOSON 0.4 MG/5ML IV SOLN
0.4000 mg | Freq: Once | INTRAVENOUS | Status: AC
  Administered 2016-11-06: 0.4 mg via INTRAVENOUS
  Filled 2016-11-06: qty 5

## 2016-11-06 MED ORDER — REGADENOSON 0.4 MG/5ML IV SOLN
INTRAVENOUS | Status: AC
  Administered 2016-11-06: 0.4 mg via INTRAVENOUS
  Filled 2016-11-06: qty 5

## 2016-11-06 MED ORDER — POTASSIUM CHLORIDE CRYS ER 20 MEQ PO TBCR
40.0000 meq | EXTENDED_RELEASE_TABLET | Freq: Once | ORAL | Status: AC
  Administered 2016-11-06: 40 meq via ORAL
  Filled 2016-11-06: qty 2

## 2016-11-06 NOTE — Progress Notes (Signed)
Pt had short episode of brady down to 36 before returning back to NSR. Pt asymptomatic. Continue to monitor.

## 2016-11-06 NOTE — Care Management Obs Status (Signed)
[facility] NOTIFICATION   Patient Details  Name: MANIYAH MOLLER MRN: [id number] Date of Birth: 24-May-1941   Medicare Observation Status Notification Given:  Yes    Bethena Roys, RN 11/06/2016, 3:39 PM

## 2016-11-06 NOTE — Progress Notes (Signed)
1. Nonspecific changes on stress ECG.  2. Primarily fixed small, mild basal inferolateral perfusion defect. Primarily fixed medium sized, moderate intensity apical septal/apical anterior/apical perfusion defect.  No evidence for significant ischemia.  3. EF 66%, normal wall motion.   Prominent breast shadow.  With fixed defects and normal wall motion, it is certainly possible that the abnormalities may be due to attenuation.  There does not appear to be significant ischemia. Overall I think this is low risk.   Nuc result reviewed. Per Dr. Angelena Form, "further plans tomorrow pending nuclear stress test." Observe overnight for recurrent bradycardia, BB dose changed to 25mg  for tomorrow. Patient made aware of plan.   Elliona Doddridge PA-C

## 2016-11-06 NOTE — Progress Notes (Signed)
Progress Note  Patient Name: Jody Ruiz Date of Encounter: 11/06/2016  Primary Cardiologist:  Dr. Martinique  Subjective   Pt seen in [facility]. She has not had any reoccurrence of chest pain.  Inpatient Medications    Scheduled Meds: . amLODipine  5 mg Oral Daily  . aspirin EC  81 mg Oral Daily  . heparin  5,000 Units Subcutaneous Q8H  . isosorbide mononitrate  60 mg Oral QHS  . metoprolol succinate  50 mg Oral Daily  . regadenoson      . regadenoson  0.4 mg Intravenous Once  . sodium chloride flush  3 mL Intravenous Q12H   Continuous Infusions: . sodium chloride     PRN Meds: sodium chloride, acetaminophen, nitroGLYCERIN, ondansetron (ZOFRAN) IV, sodium chloride flush   Vital Signs    Vitals:   11/05/16 1832 11/05/16 1900 11/06/16 0508 11/06/16 0953  BP: (!) 143/62 133/66 (!) 143/59 (!) 151/73  Pulse: 68 60 65   Resp:   16 16  Temp:  98.4 F (36.9 C) 98.5 F (36.9 C)   TempSrc:  Oral Oral   SpO2:  96% 94%   Weight:   203 lb 9.6 oz (92.4 kg)   Height:        Intake/Output Summary (Last 24 hours) at 11/06/16 1024 Last data filed at 11/06/16 0500  Gross per 24 hour  Intake             1899 ml  Output              900 ml  Net              999 ml   Filed Weights   11/05/16 0802 11/05/16 1409 11/06/16 0508  Weight: 183 lb (83 kg) 204 lb 11.2 oz (92.9 kg) 203 lb 9.6 oz (92.4 kg)    Telemetry    Seen in [facility]  ECG    n/a - Personally Reviewed  Physical Exam   GEN: Well nourished, well developed HEENT: normal  Neck: no JVD, carotid bruits, or masses Cardiac: RRR. no murmurs, rubs, or gallops,no edema. Intact distal pulses bilaterally.  Respiratory: clear to auscultation bilaterally, normal work of breathing GI: soft, nontender, nondistended, + BS MS: no deformity or atrophy  Skin: warm and dry, no rash Neuro: Alert and Oriented x 3, Strength and sensation are intact Psych:   Full affect  Labs    Chemistry Recent Labs Lab  11/05/16 0755 11/05/16 1305 11/06/16 0034  NA 140  --  141  K 3.5  --  3.3*  CL 104  --  108  CO2 25  --  26  GLUCOSE 135*  --  136*  BUN 19  --  18  CREATININE 1.68* 1.62* 1.65*  CALCIUM 9.0  --  8.5*  PROT 7.0 6.7  --   ALBUMIN 3.3* 3.5  --   AST 24 21  --   ALT 15 16  --   ALKPHOS 61 62  --   BILITOT 0.9 0.6  --   GFRNONAA 29* 30* 30*  GFRAA 34* 35* 34*  ANIONGAP 11  --  7     Hematology Recent Labs Lab 11/05/16 0755 11/05/16 1305 11/06/16 0034  WBC 8.9 7.9 8.9  RBC 3.22* 3.19* 3.14*  HGB 10.6* 10.6* 10.2*  HCT 31.9* 31.6* 31.1*  MCV 99.1 99.1 99.0  MCH 32.9 33.2 32.5  MCHC 33.2 33.5 32.8  RDW 12.2 12.5 12.3  PLT 348 328  334    Cardiac Enzymes Recent Labs Lab 11/05/16 1305 11/05/16 1843 11/06/16 0034  TROPONINI <0.03 <0.03 <0.03    Recent Labs Lab 11/05/16 0805 11/05/16 1041  TROPIPOC 0.00 0.00     BNP Recent Labs Lab 11/05/16 0755  BNP 65.3      Radiology    Dg Chest 2 View  Result Date: 11/05/2016 CLINICAL DATA:  Chest pain, weakness, and dizziness. History of previous myocardial infarction. EXAM: CHEST  2 VIEW COMPARISON:  PA and lateral chest x-ray of May 23, 2015 FINDINGS: The lungs are adequately inflated. There is chronic mild elevation of the right hemidiaphragm. There is no alveolar infiltrate. There is no pleural effusion. The heart and pulmonary vascularity are normal. The mediastinum is normal in width. The bony thorax exhibits no acute abnormality. IMPRESSION: There is no pneumonia, CHF, nor other acute cardiopulmonary disease. Electronically Signed   By: David  Martinique M.D.   On: 11/05/2016 08:38    Cardiac Studies   Myoview pending     Myoview 03/21/16: Study Highlights    The left ventricular ejection fraction is normal (55-65%).  Nuclear stress EF: 62%.  Blood pressure demonstrated a hypertensive response to exercise.  T wave inversion was noted during stress in the II, aVF, V4, V5, V6, V3 and V2  leads.  The study is normal.  This is a low risk study.   Echo: 03/18/16: Study Conclusions  - Left ventricle: The cavity size was normal. Wall thickness was increased in a pattern of mild LVH. Systolic function was normal. The estimated ejection fraction was in the range of 55% to 60%. Wall motion was normal; there were no regional wall motion abnormalities. Doppler parameters are consistent with abnormal left ventricular relaxation (grade 1 diastolic dysfunction). - Left atrium: The atrium was mildly dilated.  Impressions:  - Normal LV systolic function; grade 1 diastolic dysfunction; mild LVH; mild LAE.   Patient Profile     Jody Ruiz is a 76 y.o. female with history of s/p non-ST elevation myocardial infarction in October 2013. She had stenting in the proximal and mid LAD with a 3.0 x 16 mm and 2.5 x 28 mm Promus stents respectively. In January 2014 she presented with some recurrent symptoms then repeat cardiac catheterization showed continued patency. In September 2014 she had a Myoview study suggesting some apical ischemia. Repeat cardiac catheterization continued to show stent patency at that time.  She was admitted in December 2015 with chest pain, cough, and dyspnea. Right and left heart cath essentially normal- stents patent. Normal pulmonary pressures. Echo was normal at that time. Marland Kitchen  She was seen at the end of August 2017 with atypical chest pain. Myvoview study was normal. Echo showed mild LVH-otherwise normal. She also has history of hypothyroidism, hypertension, arthritis, dyspnea, hyperlipidemia, myocardial infarction, sleep apnea (CPAP). She presented to the ER for chest pain and has been admitted for CP r/o.  Assessment & Plan   Today's Weight: 2013 lb Admission Weight:  204  lb Net loss: + 1 Liter Blood Pressure: 151/73  Hr: 65 Labs:  Creatinine 1.65,  K 3.3 , Na  141 , WBC 8.9, Hbg 10.2   1. Chest pain with known CAD: no signs of  ischemia on EKG. Troponin negative x 3. EKG shows no signs of ischemic changes. Patient has not had any more pain since admission. Currently being seen in [place].   2. Hypokalemia: Potassium is 3.3 today, will order potassium replacement.  3. Bradycardia: Patient  had episodes of ectopic bradycardia on telemetry screen reviewed by dr. Angelena Form and Melina Copa, PA-C. This occurred down to a HR in the mid 30's overnight, but also had one episode in the 40s during daytime hours yesterday. Not clearly symptomatic with this. Will need to consider getting an EP consult vs outpatient cardiac event monitor.  4. Hyperlipidemia: Intolerant to statins and Zetia. Severe myalgias. She will need referral to lipid clinic, may be a candidate for Repatha LDL: 112 Cholesterol 178 Triglycerides 152 HDL 34  5. Hypertension: Follow after Myoview results     Signed, Linus Mako, PA-C  11/06/2016, 10:24 AM    I have personally seen and examined this patient with Delos Haring, PA-C. I agree with the assessment and plan as outlined above. She has had no recurrent chest pain. Nuclear stress test this am is pending. Sinus rhythm overnight with periods of junctional bradycardia. Will lower beta blocker dose. Follow on telemetry today. Further plans tomorrow pending nuclear stress test. May need outpatient monitor.   Lauree Chandler 11/06/2016 12:14 PM

## 2016-11-07 ENCOUNTER — Other Ambulatory Visit: Payer: Self-pay | Admitting: Cardiology

## 2016-11-07 DIAGNOSIS — R002 Palpitations: Secondary | ICD-10-CM

## 2016-11-07 DIAGNOSIS — I495 Sick sinus syndrome: Secondary | ICD-10-CM

## 2016-11-07 DIAGNOSIS — R079 Chest pain, unspecified: Secondary | ICD-10-CM | POA: Diagnosis not present

## 2016-11-07 DIAGNOSIS — I10 Essential (primary) hypertension: Secondary | ICD-10-CM | POA: Diagnosis not present

## 2016-11-07 DIAGNOSIS — I252 Old myocardial infarction: Secondary | ICD-10-CM | POA: Diagnosis not present

## 2016-11-07 DIAGNOSIS — R0789 Other chest pain: Secondary | ICD-10-CM | POA: Diagnosis not present

## 2016-11-07 DIAGNOSIS — I2510 Atherosclerotic heart disease of native coronary artery without angina pectoris: Secondary | ICD-10-CM | POA: Diagnosis not present

## 2016-11-07 MED ORDER — METOPROLOL SUCCINATE ER 25 MG PO TB24: 25.0000 mg | ORAL_TABLET | Freq: Every day | ORAL | 6 refills | Status: DC

## 2016-11-07 NOTE — Care Management Note (Signed)
Case Management Note  Patient Details  Name: Jody Ruiz MRN: [id number] Date of Birth: [birth year]  Subjective/Objective:   Pt presented for Chest Pain- Stress Test on 11-06-16. PTA-Per pt she was independent.                  Action/Plan: No needs identified by CM at this time.   Expected Discharge Date:                  Expected Discharge Plan:  Home/Self Care  In-House Referral:  NA  Discharge planning Services  CM Consult  Post Acute Care Choice:  NA Choice offered to:  NA  DME Arranged:  N/A DME Agency:  NA  HH Arranged:  NA HH Agency:  NA  Status of Service:  Completed, signed off  If discussed at [place] of Stay Meetings, dates discussed:    Additional Comments:  Bethena Roys, RN 11/07/2016, 10:21 AM

## 2016-11-07 NOTE — Consult Note (Signed)
ELECTROPHYSIOLOGY CONSULT NOTE    Patient ID: Jody Ruiz MRN: [id number], DOB/AGE: 1940-08-29 76 y.o.  Admit date: 11/05/2016 Date of Consult: 11/07/2016   Primary Physician: Geoffery Lyons, MD Primary Cardiologist: Dr. Martinique  Reason for Consultation: evaluate HR/rhythm abnormality  HPI: Jody Ruiz is a 76 y.o. female who is being seen today for the evaluation of HR variability at the request of Dr. Angelena Form.  PMHx include CAD s/p non-ST elevation myocardial infarction in October 2013. She had stenting in the proximal and mid LAD with a 3.0 x 16 mm and 2.5 x 28 mm Promus stents respectively. In January 2014 she presented with some recurrent symptoms then repeat cardiac catheterization showed continued patency, hypothyroidism, HTN, arthritis, HLD, OSA w/CPAP.  The patient came to [facility] with c/o CP.  She reports in the last 3-4 months that she has been having significant issues with GI problems, persistent diarrhea thought to have been her microscpic colitis ultimately requiring hospitalization with dehydration and dx enteritis and w/C.diff, only discharged 10/17/16.  She also reports she has been seeing her pulmonologist with her ideopathic pulmonary fibrosis and some SOB and discussion about starting treatment with her pulmonologist was put on hold because if the GI issues.  States c/o generalized fatigue and lack of energy, but had never felt weak, no near syncope, dizzy spells or syncope.  She has never been aware of any palpitations or irregularity to her heart beat until this admission.  The patient states prior to going to bed the evening prior to he admission she felt like her heart was fast and "kind of all over the place" used her husband pulse ox and got number 100-150 range, but did not make her feel bad and she went to sleep.  She woke the morning with CP left arm pain that reminded her of her heart pains and came in, she did not perceive any palpitations at that time.  She  has not had hx of palpitations or awareness of her heart beat before.  LABS K+ 3.3 (was replaced) BUN/Creat 18/1.65 Trop I: <0.03 x3 WBC 8.9 H/H 10.2/31.1plts 334  TSH on 10/15/16 was 5.423 (she tells me her PMD is monitoring no meds for now)    Past Medical History:  Diagnosis Date  . Arthritis   . Borderline hypothyroidism   . CAD (coronary artery disease)    a. 04/2012 NSTEMI/Cath:  pLAD 90%, mLAD 70-80% (long) (3x16 & 2.5x28 Promus DES to p/m LAD), pD1 70-80%, pRI 30% (small), CFX 20%, OM1 40%, mOM2 30%, pRCA 30%, PDA 30-40%, pPLB 70%, then mid 90%, EF 65%;  b. 03/2013 Abnl CL w/apical isch;  c. 04/2013 Cath: LM nl, LAD patent stents, LCX <20, RCA 30p PDA 40-50 EF 55-60% ->Cont Med Rx.  c. CP s/p LHC with stable dz and patent stents--> Rx medically   . Cataract   . Dyspnea    a. CP and SOB 06/2012 => Ticagrelor changed to Plavix  . HLD (hyperlipidemia)    intolerant to statins  . Hx of adenomatous colonic polyps   . Hx of echocardiogram    a. Echo 2/14:  mild LVH, EF 55-65%, Gr 1 diast dysfn, mild LAE  . Hypertension   . Idiopathic pulmonary fibrosis   . Internal hemorrhoids   . Lung nodules   . Microscopic colitis   . Myocardial infarction ([place]) 2013  . Obesity   . OSA (obstructive sleep apnea)    a. on CPAP  . Sleep apnea  cpap     Surgical History:  Past Surgical History:  Procedure Laterality Date  . APPENDECTOMY  1994  . BREAST BIOPSY  1965, 1975   bilateral  . CARDIAC CATHETERIZATION     x4  . CHOLECYSTECTOMY  2004  . CORONARY STENT PLACEMENT  04/2012   x2  . KNEE ARTHROSCOPY  2000   bilateral  . LEFT AND RIGHT HEART CATHETERIZATION WITH CORONARY ANGIOGRAM N/A 08/02/2014   Procedure: LEFT AND RIGHT HEART CATHETERIZATION WITH CORONARY ANGIOGRAM;  Surgeon: Burnell Blanks, MD;  Location[facility] C[facility] LAB;  Service: Cardiovascular;  Laterality: N/A;  . LEFT HEART CATHETERIZATION WITH CORONARY ANGIOGRAM N/A 04/29/2012   Procedure: LEFT HEART  CATHETERIZATION WITH CORONARY ANGIOGRAM;  Surgeon: Peter M Martinique, MD;  Location: [facility] CATH LAB;  Service: Cardiovascular;  Laterality: N/A;  . LEFT HEART CATHETERIZATION WITH CORONARY ANGIOGRAM N/A 08/03/2012   Procedure: LEFT HEART CATHETERIZATION WITH CORONARY ANGIOGRAM;  Surgeon: Burnell Blanks, MD;  Location: [facility] CATH LAB;  Service: Cardiovascular;  Laterality: N/A;  . LEFT HEART CATHETERIZATION WITH CORONARY ANGIOGRAM N/A 04/12/2013   Procedure: LEFT HEART CATHETERIZATION WITH CORONARY ANGIOGRAM;  Surgeon: Peter M Martinique, MD;  Location: [facility] CATH LAB;  Service: Cardiovascular;  Laterality: N/A;  . PERCUTANEOUS CORONARY STENT INTERVENTION (PCI-S) N/A 04/29/2012   Procedure: PERCUTANEOUS CORONARY STENT INTERVENTION (PCI-S);  Surgeon: Peter M Martinique, MD;  Location: [facility] LAB;  Service: Cardiovascular;  Laterality: N/A;  . SHOULDER ARTHROSCOPY  2012   left  . vaginal polypectomy  2004  . VENTRAL HERNIA REPAIR  2005     Prescriptions Prior to Admission  Medication Sig Dispense Refill Last Dose  . amLODipine (NORVASC) 5 MG tablet TAKE 1 TABLET DAILY 30 tablet 11 11/04/2016 at Unknown time  . aspirin EC 81 MG tablet Take 81 mg by mouth daily.   11/04/2016 at Unknown time  . Cyanocobalamin (VITAMIN B12) 1000 MCG TBCR Take 1 tablet by mouth daily. 30 tablet 0 11/04/2016 at Unknown time  . isosorbide mononitrate (IMDUR) 60 MG 24 hr tablet TAKE ONE TABLET AT BEDTIME 90 tablet 3 11/04/2016 at Unknown time  . metoprolol succinate (TOPROL XL) 50 MG 24 hr tablet Take 1 tablet (50 mg total) by mouth daily. Take with or immediately following a meal.   11/04/2016 at 1030  . nitroGLYCERIN (NITROSTAT) 0.4 MG SL tablet Place 1 tablet (0.4 mg total) under the tongue every 5 (five) minutes x 3 doses as needed for chest pain. 25 tablet 12 11/05/2016 at Unknown time    Inpatient Medications:  . amLODipine  5 mg Oral Daily  . aspirin EC  81 mg Oral Daily  . heparin  5,000 Units Subcutaneous Q8H  . isosorbide  mononitrate  60 mg Oral QHS  . metoprolol succinate  25 mg Oral Daily  . sodium chloride flush  3 mL Intravenous Q12H    Allergies:  Allergies  Allergen Reactions  . Brilinta [Ticagrelor] Shortness Of Breath, Other (See Comments) and Cough    Reaction:  Fatigue  . Lisinopril Shortness Of Breath and Cough  . Statins Other (See Comments)    Reaction:  Muscle pain/weakness  . Zetia [Ezetimibe] Other (See Comments)    Reaction:  Muscle pain/weakness   . Macrodantin [Nitrofurantoin Macrocrystal] Rash  . Sulfa Antibiotics Rash    Social History   Social History  . Marital status: Married    Spouse name: N/A  . Number of children: 3  . Years of education: N/A   Occupational History  .  Retired Therapist, sports Retired   Social History Main Topics  . Smoking status: Never Smoker  . Smokeless tobacco: Never Used  . Alcohol use No  . Drug use: No  . Sexual activity: Not on file   Other Topics Concern  . Not on file   Social History Narrative  . No narrative on file     Family History  Problem Relation Age of Onset  . Breast cancer Sister   . Prostate cancer Father   . Colon polyps Father   . Heart disease Father 24    CAD, died of ESRD  . Kidney disease Father   . Colon cancer Sister 43  . Heart disease Mother 30    Died acute MI  . Heart disease Brother 24    CABG  . Esophageal cancer Neg Hx   . Rectal cancer Neg Hx   . Stomach cancer Neg Hx      Review of Systems: All other systems reviewed and are otherwise negative except as noted above.  Physical Exam: Vitals:   11/06/16 1441 11/06/16 2054 11/07/16 0514 11/07/16 0948  BP: 127/67 (!) 110/98 133/63 (!) 145/65  Pulse: 69 74 (!) 57 (!) 59  Resp:  16 15   Temp: 98.2 F (36.8 C) 98 F (36.7 C) 98.2 F (36.8 C)   TempSrc: Oral Oral Oral   SpO2: 97% 99% 97%   Weight:   203 lb (92.1 kg)   Height:        GEN- The patient is well appearing, alert and oriented x 3 today.   HEENT: normocephalic, atraumatic; sclera  clear, conjunctiva pink; hearing intact; oropharynx clear; neck supple, no JVP Lymph- no cervical lymphadenopathy Lungs- CTA b/l, normal work of breathing.  No wheezes, rales, rhonchi Heart- RRR, no murmurs, rubs or gallops, PMI not laterally displaced GI- soft, non-tender, non-distended, bowel sounds present Extremities- no clubbing, cyanosis, or edema MS- no significant deformity or atrophy Skin- warm and dry, no rash or lesion Psych- euthymic mood, full affect Neuro- no gross deficits observed  Labs:   Lab Results  Component Value Date   WBC 8.9 11/06/2016   HGB 10.2 (L) 11/06/2016   HCT 31.1 (L) 11/06/2016   MCV 99.0 11/06/2016   PLT 334 11/06/2016    Recent Labs Lab 11/05/16 1305 11/06/16 0034  NA  --  141  K  --  3.3*  CL  --  108  CO2  --  26  BUN  --  18  CREATININE 1.62* 1.65*  CALCIUM  --  8.5*  PROT 6.7  --   BILITOT 0.6  --   ALKPHOS 62  --   ALT 16  --   AST 21  --   GLUCOSE  --  136*      Radiology/Studies:  Dg Chest 2 View Result Date: 11/05/2016 CLINICAL DATA:  Chest pain, weakness, and dizziness. History of previous myocardial infarction. EXAM: CHEST  2 VIEW COMPARISON:  PA and lateral chest x-ray of May 23, 2015 FINDINGS: The lungs are adequately inflated. There is chronic mild elevation of the right hemidiaphragm. There is no alveolar infiltrate. There is no pleural effusion. The heart and pulmonary vascularity are normal. The mediastinum is normal in width. The bony thorax exhibits no acute abnormality. IMPRESSION: There is no pneumonia, CHF, nor other acute cardiopulmonary disease. Electronically Signed   By: David  Martinique M.D.   On: 11/05/2016 08:38   Nm Myocar Multi W/spect W/wall Motion / Ef Result Date: 11/06/2016  Horizontal ST segment depression ST segment depression of 0.3 mm was noted during stress in the I and II leads.  This is a low risk study.  The left ventricular ejection fraction is hyperdynamic (>65%).  1. Nonspecific changes on  stress ECG. 2. Primarily fixed small, mild basal inferolateral perfusion defect. Primarily fixed medium sized, moderate intensity apical septal/apical anterior/apical perfusion defect.  No evidence for significant ischemia. 3. EF 66%, normal wall motion. Prominent breast shadow.  With fixed defects and normal wall motion, it is certainly possible that the abnormalities may be due to attenuation.  There does not appear to be significant ischemia.  Overall I think this is low risk.    EKG: 1. SR 70bpm, PR 230ms, QRS 162ms, QTc 492ms 2. SB 57bpm, PR 242ms, QRS 172ms, QTc 410 3. SB, 52bpm, low amplitiute P waves, PR 271ms TELEMETRY: SR, early AM hours noted slower rate 40's possibly a junctional beat intermittently without significant pauses (longest 2-2.4 seconds) and sinus arrhythmia.  She has ST to about 110, at times appears a change in P wave morphology when gained up does have atrial input   03/18/16: TTE Study Conclusions - Left ventricle: The cavity size was normal. Wall thickness was   increased in a pattern of mild LVH. Systolic function was normal.   The estimated ejection fraction was in the range of 55% to 60%.   Wall motion was normal; there were no regional wall motion   abnormalities. Doppler parameters are consistent with abnormal   left ventricular relaxation (grade 1 diastolic dysfunction). - Left atrium: The atrium was mildly dilated. Impressions: - Normal LV systolic function; grade 1 diastolic dysfunction; mild   LVH; mild LAE.   Assessment and Plan:   1. CP     c/w cardiology     Not an ongoing complaint     Low risk stress test  2. Palpitations with reports of HR by pulse ox at home to get to 150 breifly     No arrhythmias noted here     No symptoms with fluctuating HR here, with junctional beats, and what appears possibly an ectopic rhythm     She fater rates about 110 that look sinus     No symptoms of bradycardia, her BB has been reduced in response to this      Agree, Would pursue holter/event monitoring     I do not see need for further at this time  Dr. Lovena Le will see          Signed, Tommye Standard, PA-C 11/07/2016 11:17 AM  EP Attending  Patient seen and examined. Agree with the findings as noted above. The patient has tachy brady syndrome but is not too severe with HR's in the 120-130 range and then down into the 40's. Agree with reduction in beta blocker. She may be having some runs of MAT and a calcium channel blocker would be appropriate. I can see her back in followup as I see her husband.   Mikle Bosworth.D.

## 2016-11-07 NOTE — Discharge Summary (Signed)
Discharge Summary    Patient ID: Jody Ruiz,  MRN: [id number], DOB/AGE: 01/28/1941 76 y.o.  Admit date: 11/05/2016 Discharge date: 11/07/2016  Primary Care Provider: ARONSON,RICHARD A Primary Cardiologist: [place]  Discharge Diagnoses    Active Problems:   Chest pain   Allergies Allergies  Allergen Reactions  . Brilinta [Ticagrelor] Shortness Of Breath, Other (See Comments) and Cough    Reaction:  Fatigue  . Lisinopril Shortness Of Breath and Cough  . Statins Other (See Comments)    Reaction:  Muscle pain/weakness  . Zetia [Ezetimibe] Other (See Comments)    Reaction:  Muscle pain/weakness   . Macrodantin [Nitrofurantoin Macrocrystal] Rash  . Sulfa Antibiotics Rash    Diagnostic Studies/Procedures    Lexiscan: 11/05/16 _____________   History of Present Illness     Jody Ruiz is a 76 y.o. female with history of s/p non-ST elevation myocardial infarction in October 2013. She had stenting in the proximal and mid LAD with a 3.0 x 16 mm and 2.5 x 28 mm Promus stents respectively. In January 2014 she presented with some recurrent symptoms then repeat cardiac catheterization showed continued patency. In September 2014 she had a Myoview study suggesting some apical ischemia. Repeat cardiac catheterization continued to show stent patency at that time.  She was admitted in December 2015 with chest pain, cough, and dyspnea. Right and left heart cath essentially normal- stents patent. Normal pulmonary pressures. Echo was normal at that time. She was seen at the end of August 2017 with atypical chest pain. Myvoview study was normal. Echo showed mild LVH-otherwise normal. She also has history of hypothyroidism, hypertension, arthritis, dyspnea, hyperlipidemia, myocardial infarction, sleep apnea (CPAP)  She presented to the emergency department 11/05/16 for left sided chest pain that started around 2am that morning. It was sharp and awoke her from sleep, radiates to left  shoulder and arm. She took 1 SL nitro and did have some relief. She had associated heart palpitations and SOB lasting 1 hour and resolved on its own. Used her husbands pulse ox and her pulse jumped from 100 up to 150s. She was able to calm herself down and her pulse returned to normal. The CP returned again at 5 am and felt like the CP she had during her MI. She called EMS. They administered 324 mg ASA and another nitro. Nitro did provide some relief. So far troponin x 2 is negative, K 2.5, Hgb 10.6, platelets 348, Na 140, Cr 1.68 (baseline 1.5-1.6), negative chest xray, EKG shows HR 70, sinus rhythm nonspecific interventricular conduction delay- no ischemic process.  Hospital Course     Consultants: EP  She was admitted and ruled out. Underwent a lexiscan myoview that was low risk. On telemetry noted to have intermittent pauses, junctional rhythm, and ectopic beats. Her BB was reduced from metoprolol 50mg  daily to 25mg  daily. Also noted to have episodes of ST, along episodes of SB. 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 Of note she was asymptomatic with this rhythm. Planned for outpatient cardiac monitor with EP follow up in the office. She was seen and assessed by Dr. Angelena Form and determined stable for discharge home. Follow up has been arranged.  _____________  Discharge Vitals Blood pressure 125/76, pulse 77, temperature 98.2 F (36.8 C), temperature source Oral, resp. rate 15, height 5\' 7"  (1.702 m), weight 203 lb (92.1 kg), SpO2 97 %.  Filed Weights   11/05/16 1409 11/06/16 0508 11/07/16 0514  Weight: 204 lb  11.2 oz (92.9 kg) 203 lb 9.6 oz (92.4 kg) 203 lb (92.1 kg)    Labs & Radiologic Studies    CBC  Recent Labs  11/05/16 1305 11/06/16 0034  WBC 7.9 8.9  HGB 10.6* 10.2*  HCT 31.6* 31.1*  MCV 99.1 99.0  PLT 328 086   Basic Metabolic Panel  Recent Labs  11/05/16 0755 11/05/16 1305 11/06/16 0034  NA 140  --  141  K 3.5  --  3.3*  CL 104  --  108   CO2 25  --  26  GLUCOSE 135*  --  136*  BUN 19  --  18  CREATININE 1.68* 1.62* 1.65*  CALCIUM 9.0  --  8.5*   Liver Function Tests  Recent Labs  11/05/16 0755 11/05/16 1305  AST 24 21  ALT 15 16  ALKPHOS 61 62  BILITOT 0.9 0.6  PROT 7.0 6.7  ALBUMIN 3.3* 3.5    Recent Labs  11/05/16 0755  LIPASE 33   Cardiac Enzymes  Recent Labs  11/05/16 1305 11/05/16 1843 11/06/16 0034  TROPONINI <0.03 <0.03 <0.03   BNP Invalid input(s): POCBNP D-Dimer No results for input(s): DDIMER in the last 72 hours. Hemoglobin A1C No results for input(s): HGBA1C in the last 72 hours. Fasting Lipid Panel  Recent Labs  11/06/16 0034  CHOL 178  HDL 34*  LDLCALC 114*  TRIG 152*  CHOLHDL 5.2   Thyroid Function Tests No results for input(s): TSH, T4TOTAL, T3FREE, THYROIDAB in the last 72 hours.  Invalid input(s): FREET3 _____________  Dg Chest 2 View  Result Date: 11/05/2016 CLINICAL DATA:  Chest pain, weakness, and dizziness. History of previous myocardial infarction. EXAM: CHEST  2 VIEW COMPARISON:  PA and lateral chest x-ray of May 23, 2015 FINDINGS: The lungs are adequately inflated. There is chronic mild elevation of the right hemidiaphragm. There is no alveolar infiltrate. There is no pleural effusion. The heart and pulmonary vascularity are normal. The mediastinum is normal in width. The bony thorax exhibits no acute abnormality. IMPRESSION: There is no pneumonia, CHF, nor other acute cardiopulmonary disease. Electronically Signed   By: David  [place] M.D.   On: 11/05/2016 08:38   Nm Myocar Multi W/spect W/wall Motion / Ef  Result Date: 11/06/2016  Horizontal ST segment depression ST segment depression of 0.3 mm was noted during stress in the I and II leads.  This is a low risk study.  The left ventricular ejection fraction is hyperdynamic (>65%).  1. Nonspecific changes on stress ECG. 2. Primarily fixed small, mild basal inferolateral perfusion defect. Primarily fixed  medium sized, moderate intensity apical septal/apical anterior/apical perfusion defect.  No evidence for significant ischemia. 3. EF 66%, normal wall motion. Prominent breast shadow.  With fixed defects and normal wall motion, it is certainly possible that the abnormalities may be due to attenuation.  There does not appear to be significant ischemia.  Overall I think this is low risk.   Disposition   Pt is being discharged home today in good condition.  Follow-up Plans & Appointments    Follow-up Information    Cristopher Peru, MD Follow up on 11/25/2016.   Specialty:  Cardiology Why:  at 2:30pm for your follow up appt.  Contact information: [address] [address] [phone]        [facility] Follow up.   Specialty:  Cardiology Why:  The office will call you with an appt to set up your heart monitor.  Contact information: [address], [place] [phone]         Discharge Instructions    Diet - low sodium heart healthy    Complete by:  As directed    Discharge instructions    Complete by:  As directed    We reduced your metoprolol dose from 50mg  to 25mg  daily.   Increase activity slowly    Complete by:  As directed       Discharge Medications   Current Discharge Medication List    CONTINUE these medications which have CHANGED   Details  metoprolol succinate (TOPROL-XL) 25 MG 24 hr tablet Take 1 tablet (25 mg total) by mouth daily. Qty: 30 tablet, Refills: 6      CONTINUE these medications which have NOT CHANGED   Details  amLODipine (NORVASC) 5 MG tablet TAKE 1 TABLET DAILY Qty: 30 tablet, Refills: 11    aspirin EC 81 MG tablet Take 81 mg by mouth daily.    Cyanocobalamin (VITAMIN B12) 1000 MCG TBCR Take 1 tablet by mouth daily. Qty: 30 tablet, Refills: 0    isosorbide mononitrate (IMDUR) 60 MG 24 hr tablet TAKE ONE TABLET AT BEDTIME Qty: 90 tablet, Refills: 3      nitroGLYCERIN (NITROSTAT) 0.4 MG SL tablet Place 1 tablet (0.4 mg total) under the tongue every 5 (five) minutes x 3 doses as needed for chest pain. Qty: 25 tablet, Refills: 12   Associated Diagnoses: Unstable angina ([place])          Outstanding Labs/Studies   N/A  Duration of Discharge Encounter   Greater than 30 minutes including physician time.  Signed, Reino Bellis NP-C 11/07/2016, 3:55 PM

## 2016-11-07 NOTE — Progress Notes (Signed)
Patient had 2.05 & 2.02 seconds pause last night. Patient was asymptomatic. Will continue to monitor

## 2016-11-07 NOTE — Progress Notes (Signed)
Progress Note  Patient Name: Jody Ruiz Date of Encounter: 11/07/2016  Primary Cardiologist: [place]  Subjective   No chest pain this morning.   Inpatient Medications    Scheduled Meds: . amLODipine  5 mg Oral Daily  . aspirin EC  81 mg Oral Daily  . heparin  5,000 Units Subcutaneous Q8H  . isosorbide mononitrate  60 mg Oral QHS  . metoprolol succinate  25 mg Oral Daily  . sodium chloride flush  3 mL Intravenous Q12H   Continuous Infusions: . sodium chloride     PRN Meds: sodium chloride, acetaminophen, nitroGLYCERIN, ondansetron (ZOFRAN) IV, sodium chloride flush   Vital Signs    Vitals:   11/06/16 1213 11/06/16 1441 11/06/16 2054 11/07/16 0514  BP: 126/68 127/67 (!) 110/98 133/63  Pulse: 86 69 74 (!) 57  Resp:   16 15  Temp:  98.2 F (36.8 C) 98 F (36.7 C) 98.2 F (36.8 C)  TempSrc:  Oral Oral Oral  SpO2:  97% 99% 97%  Weight:    203 lb (92.1 kg)  Height:        Intake/Output Summary (Last 24 hours) at 11/07/16 0855 Last data filed at 11/06/16 2136  Gross per 24 hour  Intake              541 ml  Output              600 ml  Net              -59 ml   Filed Weights   11/05/16 1409 11/06/16 0508 11/07/16 0514  Weight: 204 lb 11.2 oz (92.9 kg) 203 lb 9.6 oz (92.4 kg) 203 lb (92.1 kg)    Telemetry    SR with episodes of junctional bradycardia, and ST- Personally Reviewed  ECG    N/A - Personally Reviewed  Physical Exam   General: Well developed, well nourished, female appearing in no acute distress. Head: Normocephalic, atraumatic.  Neck: Supple without bruits, JVD. Lungs:  Resp regular and unlabored, CTA. Heart: RRR, S1, S2, no S3, S4, or murmur; no rub. Abdomen: Soft, non-tender, non-distended with normoactive bowel sounds. No hepatomegaly. No rebound/guarding. No obvious abdominal masses. Extremities: No clubbing, cyanosis, edema. Distal pedal pulses are 2+ bilaterally. Neuro: Alert and oriented X 3. Moves all extremities  spontaneously. Psych: Normal affect.  Labs    Chemistry Recent Labs Lab 11/05/16 0755 11/05/16 1305 11/06/16 0034  NA 140  --  141  K 3.5  --  3.3*  CL 104  --  108  CO2 25  --  26  GLUCOSE 135*  --  136*  BUN 19  --  18  CREATININE 1.68* 1.62* 1.65*  CALCIUM 9.0  --  8.5*  PROT 7.0 6.7  --   ALBUMIN 3.3* 3.5  --   AST 24 21  --   ALT 15 16  --   ALKPHOS 61 62  --   BILITOT 0.9 0.6  --   GFRNONAA 29* 30* 30*  GFRAA 34* 35* 34*  ANIONGAP 11  --  7     Hematology Recent Labs Lab 11/05/16 0755 11/05/16 1305 11/06/16 0034  WBC 8.9 7.9 8.9  RBC 3.22* 3.19* 3.14*  HGB 10.6* 10.6* 10.2*  HCT 31.9* 31.6* 31.1*  MCV 99.1 99.1 99.0  MCH 32.9 33.2 32.5  MCHC 33.2 33.5 32.8  RDW 12.2 12.5 12.3  PLT 348 328 334    Cardiac Enzymes Recent Labs Lab 11/05/16 1305  11/05/16 1843 11/06/16 0034  TROPONINI <0.03 <0.03 <0.03    Recent Labs Lab 11/05/16 0805 11/05/16 1041  TROPIPOC 0.00 0.00     BNP Recent Labs Lab 11/05/16 0755  BNP 65.3     DDimer No results for input(s): DDIMER in the last 168 hours.    Radiology    Nm Myocar Multi W/spect W/wall Motion / Ef  Result Date: 11/06/2016  Horizontal ST segment depression ST segment depression of 0.3 mm was noted during stress in the I and II leads.  This is a low risk study.  The left ventricular ejection fraction is hyperdynamic (>65%).  1. Nonspecific changes on stress ECG. 2. Primarily fixed small, mild basal inferolateral perfusion defect. Primarily fixed medium sized, moderate intensity apical septal/apical anterior/apical perfusion defect.  No evidence for significant ischemia. 3. EF 66%, normal wall motion. Prominent breast shadow.  With fixed defects and normal wall motion, it is certainly possible that the abnormalities may be due to attenuation.  There does not appear to be significant ischemia.  Overall I think this is low risk.    Cardiac Studies   Lexiscan myoview: 11/06/16, noted above  Patient  Profile     76 y.o. female with PMH of CAD s/p multiple stents, hypothyroidism, hypertension, arthritis, dyspnea, hyperlipidemia, myocardial infarction, sleep apnea (CPAP). She presented to the ER for chest pain and has been admitted for CP r/o. Underwent stress testing yesterday.   Assessment & Plan    1. Chest pain: Troponin negative x 3. EKG shows no signs of ischemic changes. Patient has not had any more pain since admission. Had a low risk stress test yesterday.   2. Bradycardia/Tachycardia: Metoprolol dose was reduced to 25mg  yesterday afternoon, but she has already received 50mg . Did have 2/3 pauses on telemetry while at rest early this morning, around 2.10-2.15 seconds. Asymptomatic with these episodes. Also then has episodes of ST.  -- will ask EP to see today.   3. HL: Intolerant to statins and Zetia. Consider referral to lipids.   Signed, Reino Bellis, NP  11/07/2016, 8:55 AM    I have personally seen and examined this patient with Reino Bellis, NP. I agree with the assessment and plan as outlined above. She is admitted with one episode of chest pain and palpitations with report of heart rate over 150 at rest at home which woke her from her sleep. She has had no recurrence of chest pain. Nuclear stress test was low risk. No further ischemic workup indicated. She has episodes of sinus brady noted on tele with what appears to be a junctional bradycardia as well as periods of narrow complex tachycardia. She is not symptomatic during these events. I will ask our EP team to see her today. She may just need an outpatient cardiac monitor. Toprol dose has been reduced but she received a full 50 mg dose yesterday.   Discharge later today pending EP evaluation.   Lauree Chandler 11/07/2016 9:13 AM

## 2016-11-10 ENCOUNTER — Encounter: Payer: Self-pay | Admitting: Internal Medicine

## 2016-11-13 ENCOUNTER — Ambulatory Visit (INDEPENDENT_AMBULATORY_CARE_PROVIDER_SITE_OTHER): Payer: Medicare Other

## 2016-11-13 DIAGNOSIS — I495 Sick sinus syndrome: Secondary | ICD-10-CM

## 2016-11-20 DIAGNOSIS — N281 Cyst of kidney, acquired: Secondary | ICD-10-CM | POA: Diagnosis not present

## 2016-11-20 DIAGNOSIS — Z6830 Body mass index (BMI) 30.0-30.9, adult: Secondary | ICD-10-CM | POA: Diagnosis not present

## 2016-11-20 DIAGNOSIS — E784 Other hyperlipidemia: Secondary | ICD-10-CM | POA: Diagnosis not present

## 2016-11-20 DIAGNOSIS — E038 Other specified hypothyroidism: Secondary | ICD-10-CM | POA: Diagnosis not present

## 2016-11-20 DIAGNOSIS — M1990 Unspecified osteoarthritis, unspecified site: Secondary | ICD-10-CM | POA: Diagnosis not present

## 2016-11-20 DIAGNOSIS — I2510 Atherosclerotic heart disease of native coronary artery without angina pectoris: Secondary | ICD-10-CM | POA: Diagnosis not present

## 2016-11-20 DIAGNOSIS — R7302 Impaired glucose tolerance (oral): Secondary | ICD-10-CM | POA: Diagnosis not present

## 2016-11-20 DIAGNOSIS — R5383 Other fatigue: Secondary | ICD-10-CM | POA: Diagnosis not present

## 2016-11-20 DIAGNOSIS — Z1389 Encounter for screening for other disorder: Secondary | ICD-10-CM | POA: Diagnosis not present

## 2016-11-20 DIAGNOSIS — E668 Other obesity: Secondary | ICD-10-CM | POA: Diagnosis not present

## 2016-11-20 DIAGNOSIS — K589 Irritable bowel syndrome without diarrhea: Secondary | ICD-10-CM | POA: Diagnosis not present

## 2016-11-20 DIAGNOSIS — I10 Essential (primary) hypertension: Secondary | ICD-10-CM | POA: Diagnosis not present

## 2016-11-25 ENCOUNTER — Ambulatory Visit: Payer: Medicare Other | Admitting: Internal Medicine

## 2016-12-02 ENCOUNTER — Ambulatory Visit (INDEPENDENT_AMBULATORY_CARE_PROVIDER_SITE_OTHER): Payer: Medicare Other | Admitting: Physician Assistant

## 2016-12-02 ENCOUNTER — Encounter: Payer: Self-pay | Admitting: Physician Assistant

## 2016-12-02 VITALS — BP 124/60 | HR 65 | Ht 66.5 in | Wt 202.0 lb

## 2016-12-02 DIAGNOSIS — K52832 Lymphocytic colitis: Secondary | ICD-10-CM | POA: Diagnosis not present

## 2016-12-02 DIAGNOSIS — A0472 Enterocolitis due to Clostridium difficile, not specified as recurrent: Secondary | ICD-10-CM | POA: Diagnosis not present

## 2016-12-02 DIAGNOSIS — Z86010 Personal history of colonic polyps: Secondary | ICD-10-CM

## 2016-12-02 DIAGNOSIS — Z860101 Personal history of adenomatous and serrated colon polyps: Secondary | ICD-10-CM

## 2016-12-02 NOTE — Patient Instructions (Addendum)
Follow up with Nicoletta Ba PA or Dr. Henrene Pastor as needed.  You will be due for a colonoscopy on 05-07-2018.

## 2016-12-02 NOTE — Progress Notes (Signed)
Reviewed assessment and plans. Glad she is doing better

## 2016-12-02 NOTE — Progress Notes (Signed)
Subjective:    Patient ID: Jody Ruiz, female    DOB: 1940/12/19, 76 y.o.   MRN: [id number]  HPI Jody Ruiz is a pleasant 76 year old white female known to Jody Ruiz and myself. She comes in today for post hospital follow-up after admission for 10 through 10/17/2016 with severe diarrhea and dehydration. She was found to be C. difficile positive and treated with a 14 day course of vancomycin. She was also hospitalized 54 through 11/07/2016 with chest pain and palpitations. She is undergoing cardiac evaluation. She had a Lexiscan which was low risk and is currently wearing a 30 day monitor. It is felt she likely has a tachybradycardia syndrome. She had adjustment to her Toprol and says she has not had any episodes since discharge. The background of this patient has history of pulmonary fibrosis, hypertension, coronary artery disease status post MI and previous stent, sleep apnea, and prior diagnosis of microscopic colitis which was made per Jody Ruiz several years ago. Patient had been struggling with diarrhea for about 3 months earlier this spring. She was tested with a GI pathogen panel on 08/06/2016 which was negative. At that time we opted to treat her with a trial of budesonide for probable exacerbation of her microscopic colitis. She tolerated this well for about 6 weeks but then developed increased belching and bloating and abdominal discomfort and stopped the budesonide. She was then given Pepto-Bismol 2 tablets 3 times daily without any benefit. She was seen back in the office on 4/10 with worsening diarrhea nausea and abdominal discomfort weakness and was admitted.  She is currently having normal formed stools and says she is not having any abdominal pain. Her appetite has improved and she is gaining some weight back. Overall doing much better. Last colonoscopy done in November 2016 with finding of multiple tubular adenomatous polyps and one sessile serrated polyp. She was not rebiopsied  for microscopic colitis at that time. She is recommended to have 3 year interval follow-up.  Review of Systems Pertinent positive and negative review of systems were noted in the above HPI section.  All other review of systems was otherwise negative.  Outpatient Encounter Prescriptions as of 12/02/2016  Medication Sig  . amLODipine (NORVASC) 5 MG tablet Take 5 mg by mouth daily.  Marland Kitchen aspirin EC 81 MG tablet Take 81 mg by mouth daily.  . Cyanocobalamin (VITAMIN B12) 1000 MCG TBCR Take 1 tablet by mouth daily.  . isosorbide mononitrate (IMDUR) 60 MG 24 hr tablet Take 60 mg by mouth at bedtime.  . metoprolol succinate (TOPROL-XL) 25 MG 24 hr tablet Take 1 tablet (25 mg total) by mouth daily.  . nitroGLYCERIN (NITROSTAT) 0.4 MG SL tablet Place 1 tablet (0.4 mg total) under the tongue every 5 (five) minutes x 3 doses as needed for chest pain.   No facility-administered encounter medications on file as of 12/02/2016.    Allergies  Allergen Reactions  . Brilinta [Ticagrelor] Shortness Of Breath, Other (See Comments) and Cough    Reaction:  Fatigue  . Lisinopril Shortness Of Breath and Cough  . Statins Other (See Comments)    Reaction:  Muscle pain/weakness  . Zetia [Ezetimibe] Other (See Comments)    Reaction:  Muscle pain/weakness   . Macrodantin [Nitrofurantoin Macrocrystal] Rash  . Sulfa Antibiotics Rash   Patient Active Problem List   Diagnosis Date Noted  . Palpitations   . Tachycardia-bradycardia syndrome ([place])   . Chest pain 11/05/2016  . Renal insufficiency 10/16/2016  . B12 deficiency 10/16/2016  .  Enteritis due to Clostridium difficile   . Diarrhea with dehydration 10/14/2016  . Microscopic colitis 10/02/2016  . Hx of adenomatous colonic polyps 10/02/2016  . Altered mental status   . CAD (coronary artery disease)   . Interstitial lung disease ([place])   . HLD (hyperlipidemia)   . Precordial pain 07/17/2013  . Midsternal chest pain 07/17/2013  . Pulmonary nodule 11/22/2012  .  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 OSA (obstructive sleep apnea) 10/05/2012  . IPF (idiopathic pulmonary fibrosis) ([place]) 08/26/2012  . Coronary atherosclerosis of native coronary artery 05/04/2012  . Hypertension   . Obesity    Social History   Social History  . Marital status: Married    Spouse name: N/A  . Number of children: 3  . Years of education: N/A   Occupational History  . Retired Therapist, sports Retired   Social History Main Topics  . Smoking status: Never Smoker  . Smokeless tobacco: Never Used  . Alcohol use No  . Drug use: No  . Sexual activity: Not on file   Other Topics Concern  . Not on file   Social History Narrative  . No narrative on file    Jody Ruiz's family history includes Breast cancer in her sister; Colon cancer (age of onset: 76) in her sister; Colon polyps in her father; Heart disease (age of onset: 42) in her brother; Heart disease (age of onset: 12) in her mother; Heart disease (age of onset: 47) in her father; Kidney disease in her father; Prostate cancer in her father.      Objective:    Vitals:   12/02/16 1328  BP: 124/60  Pulse: 65    Physical Exam  well-developed older white female in no acute distress, pleasant blood pressure 124/60 pulse 65. Height 5 foot 6, weight 202, BMI 32.1. HEENT; nontraumatic normocephalic EOMI PERRLA sclera anicteric, Cardiovascular ;regular rate and rhythm with S1-S2 no murmur or gallop, Pulmonary ;clear bilaterally, Abdomen; soft, bowel sounds are present, she is nontender there is no palpable mass or hepatosplenomegaly she does have some chronic appearing erythema in the umbilicus, Rectal; exam not done, Ext;no clubbing cyanosis or edema skin warm and dry, Neuropsych; mood and affect appropriate       Assessment & Plan:   #65 76 year old white female with recent prolonged diarrheal illness initially felt secondary to lymphocytic colitis exacerbation as testing for C. difficile was negative. Symptoms  persistent and worsened despite treatment with budesonide, and patient eventually required hospitalization at which time she was diagnosed with C. difficile. She has completed a 14 day course of vancomycin and is currently asymptomatic and feeling much better. #2 personal history of adenomatous colon polyps and sessile serrated polyp, last colonoscopy November 2016 will be due for follow-up November 2019 #3 family history of colon cancer/patient's sister #4 coronary artery disease status post stenting #5 pulmonary fibrosis #6 obstructive sleep apnea #7 hypertension #8 probable tachybradycardia syndrome-cardiac workup in progress  Plan; Long discussion with patient today regarding her recent course. Is she is currently doing well and having no problems with diarrhea we will observe-she did not tolerate budesonide well, and no current evidence for active lymphocytic colitis. Patient is asked to follow up as needed with Jody Ruiz or myself for any recurrence of diarrhea. She will be due for follow-up colonoscopy November 2019.  Georganna Maxson S Minnie Shi PA-C 12/02/2016   Cc: Burnard Bunting, MD

## 2016-12-03 ENCOUNTER — Encounter: Payer: Self-pay | Admitting: Cardiology

## 2016-12-16 DIAGNOSIS — Z09 Encounter for follow-up examination after completed treatment for conditions other than malignant neoplasm: Secondary | ICD-10-CM | POA: Diagnosis not present

## 2016-12-19 NOTE — Progress Notes (Signed)
CARDIOLOGY OFFICE NOTE  Date:  12/23/2016    Jody Ruiz Date of Birth: 04/28/1941 Medical Record #[id number]  PCP:  Burnard Bunting, MD  Cardiologist:  Martinique  Chief Complaint  Patient presents with  . Coronary Artery Disease     History of Present Illness: Jody Ruiz is a 76 y.o. female is seen today for follow up CAD. She is s/p non-ST elevation myocardial infarction in October 2013. She had stenting of tandem lesions in the proximal and mid LAD with a 3.0 x 16 mm and 2.5 x 28 mm Promus stents respectively. In January 2014 she presented with some recurrent symptoms then repeat cardiac catheterization showed continued patency. In September 2014 she had a Myoview study was suggested some apical ischemia. Repeat cardiac catheterization continued to show stent patency at that time.  She was admitted in December 2015 with chest pain, cough, and dyspnea.  Right and left heart cath essentially normal. Stents patent. Normal pulmonary pressures. Echo was normal. Subsequently diagnosed with ILD.   She was seen at the end of August with atypical chest pain. Myvoview study was normal. Echo showed mild LVH-otherwise normal.   She was admitted in April with diarrhea and dehydration. Treated for C. Difficile colitis.   In May she was admitted with atypical left chest pain. She ruled out for MI. Underwent a lexiscan myoview that was low risk. On telemetry noted to have intermittent pauses, junctional rhythm, and ectopic beats. Her BB was reduced from metoprolol 50mg  daily to 25mg  daily. Also noted to have episodes of ST versus MAT, along episodes of SB. 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 Of note she was asymptomatic with this rhythm. Planned for outpatient cardiac monitor with EP follow up in the office. Event monitor was benign without significant tachy or brady arrhythmias. Occ. PVCs and couplets.   On follow up today she states she is not doing well.  Complains of loss of appetite and generalized abdominal discomfort. No diarrhea. Dry cough. No chest pain. A lot of belching. Found to have a right breast lump and is scheduled for needle bx. No chest pain, palpitations, dizziness, syncope.   Past Medical History:  Diagnosis Date  . Arthritis   . Borderline hypothyroidism   . CAD (coronary artery disease)    a. 04/2012 NSTEMI/Cath:  pLAD 90%, mLAD 70-80% (long) (3x16 & 2.5x28 Promus DES to p/m LAD), pD1 70-80%, pRI 30% (small), CFX 20%, OM1 40%, mOM2 30%, pRCA 30%, PDA 30-40%, pPLB 70%, then mid 90%, EF 65%;  b. 03/2013 Abnl CL w/apical isch;  c. 04/2013 Cath: LM nl, LAD patent stents, LCX <20, RCA 30p PDA 40-50 EF 55-60% ->Cont Med Rx.  c. CP s/p LHC with stable dz and patent stents--> Rx medically   . Cataract   . Dyspnea    a. CP and SOB 06/2012 => Ticagrelor changed to Plavix  . HLD (hyperlipidemia)    intolerant to statins  . Hx of adenomatous colonic polyps   . Hx of echocardiogram    a. Echo 2/14:  mild LVH, EF 55-65%, Gr 1 diast dysfn, mild LAE  . Hypertension   . Idiopathic pulmonary fibrosis   . Internal hemorrhoids   . Lung nodules   . Microscopic colitis   . Myocardial infarction ([place]) 2013  . Obesity   . OSA (obstructive sleep apnea)    a. on CPAP  . Sleep apnea    cpap    Past Surgical  History:  Procedure Laterality Date  . APPENDECTOMY  1994  . BREAST BIOPSY  1965, 1975   bilateral  . CARDIAC CATHETERIZATION     x4  . CHOLECYSTECTOMY  2004  . CORONARY STENT PLACEMENT  04/2012   x2  . KNEE ARTHROSCOPY  2000   bilateral  . LEFT AND RIGHT HEART CATHETERIZATION WITH CORONARY ANGIOGRAM N/A 08/02/2014   Procedure: LEFT AND RIGHT HEART CATHETERIZATION WITH CORONARY ANGIOGRAM;  Surgeon: Burnell Blanks, MD;  Location: [facility] CATH LAB;  Service: Cardiovascular;  Laterality: N/A;  . LEFT HEART CATHETERIZATION WITH CORONARY ANGIOGRAM N/A 04/29/2012   Procedure: LEFT HEART CATHETERIZATION WITH CORONARY ANGIOGRAM;   Surgeon: Peter M Martinique, MD;  Location: [facility] CATH LAB;  Service: Cardiovascular;  Laterality: N/A;  . LEFT HEART CATHETERIZATION WITH CORONARY ANGIOGRAM N/A 08/03/2012   Procedure: LEFT HEART CATHETERIZATION WITH CORONARY ANGIOGRAM;  Surgeon: Burnell Blanks, MD;  Location: [facility] CATH LAB;  Service: Cardiovascular;  Laterality: N/A;  . LEFT HEART CATHETERIZATION WITH CORONARY ANGIOGRAM N/A 04/12/2013   Procedure: LEFT HEART CATHETERIZATION WITH CORONARY ANGIOGRAM;  Surgeon: Peter M Martinique, MD;  Location: [facility] CATH LAB;  Service: Cardiovascular;  Laterality: N/A;  . PERCUTANEOUS CORONARY STENT INTERVENTION (PCI-S) N/A 04/29/2012   Procedure: PERCUTANEOUS CORONARY STENT INTERVENTION (PCI-S);  Surgeon: Peter M Martinique, MD;  Location: [facility] CATH LAB;  Service: Cardiovascular;  Laterality: N/A;  . SHOULDER ARTHROSCOPY  2012   left  . vaginal polypectomy  2004  . VENTRAL HERNIA REPAIR  2005     Medications: Current Outpatient Prescriptions  Medication Sig Dispense Refill  . amLODipine (NORVASC) 5 MG tablet Take 5 mg by mouth daily.    Marland Kitchen aspirin EC 81 MG tablet Take 81 mg by mouth daily.    . isosorbide mononitrate (IMDUR) 60 MG 24 hr tablet Take 60 mg by mouth at bedtime.    . metoprolol succinate (TOPROL-XL) 25 MG 24 hr tablet Take 1 tablet (25 mg total) by mouth daily. 30 tablet 6  . nitroGLYCERIN (NITROSTAT) 0.4 MG SL tablet Place 1 tablet (0.4 mg total) under the tongue every 5 (five) minutes x 3 doses as needed for chest pain. 25 tablet 12   No current facility-administered medications for this visit.     Allergies: Allergies  Allergen Reactions  . Brilinta [Ticagrelor] Shortness Of Breath, Other (See Comments) and Cough    Reaction:  Fatigue  . Lisinopril Shortness Of Breath and Cough  . Statins Other (See Comments)    Reaction:  Muscle pain/weakness  . Zetia [Ezetimibe] Other (See Comments)    Reaction:  Muscle pain/weakness   . Macrodantin [Nitrofurantoin Macrocrystal] Rash  . Sulfa  Antibiotics Rash    Social History: The patient  reports that she has never smoked. She has never used smokeless tobacco. She reports that she does not drink alcohol or use drugs.   Family History: The patient's family history includes Breast cancer in her sister; Colon cancer (age of onset: 3) in her sister; Colon polyps in her father; Heart disease (age of onset: 110) in her brother; Heart disease (age of onset: 34) in her mother; Heart disease (age of onset: 80) in her father; Kidney disease in her father; Prostate cancer in her father.   Review of Systems: Please see the history of present illness.    All other systems are reviewed and negative.   Physical Exam: VS:  BP 126/78 (BP Location: Left Arm)   Pulse 69   Ht 5\' 7"  (1.702 m)  Wt 199 lb 12.8 oz (90.6 kg)   LMP  (LMP Unknown)   BMI 31.29 kg/m  .  BMI Body mass index is 31.29 kg/m.  General: Pleasant. Well developed, well nourished and in no acute distress. HEENT: Normal.  Neck: Supple, no JVD, carotid bruits, or masses noted.  Cardiac: Regular rate and rhythm. No murmurs, rubs, or gallops. No edema.  Respiratory:  Lungs clear GI: Soft and nontender.  MS: No deformity or atrophy. Gait and ROM intact.  Skin: Warm and dry. Color sallow.   Neuro:  Strength and sensation are intact and no gross focal deficits noted.  Psych: Alert, appropriate and with normal affect. Looks fatigued.    Wt Readings from Last 3 Encounters:  12/23/16 199 lb 12.8 oz (90.6 kg)  12/02/16 202 lb (91.6 kg)  11/07/16 203 lb (92.1 kg)    LABORATORY DATA:    Lab Results  Component Value Date   WBC 8.9 11/06/2016   HGB 10.2 (L) 11/06/2016   HCT 31.1 (L) 11/06/2016   PLT 334 11/06/2016   GLUCOSE 136 (H) 11/06/2016   CHOL 178 11/06/2016   TRIG 152 (H) 11/06/2016   HDL 34 (L) 11/06/2016   LDLCALC 114 (H) 11/06/2016   ALT 16 11/05/2016   AST 21 11/05/2016   NA 141 11/06/2016   K 3.3 (L) 11/06/2016   CL 108 11/06/2016   CREATININE 1.65  (H) 11/06/2016   BUN 18 11/06/2016   CO2 26 11/06/2016   TSH 5.423 (H) 10/15/2016   INR 1.04 11/05/2016    BNP (last 3 results) No results for input(s): PROBNP in the last 8760 hours.   Ecg today shows NSR rate 72. Nonspecific ST-T changes. I have personally reviewed and interpreted this study.  Other Studies Reviewed Today:  Labs from primary care dated 04/04/16; cholesterol 208, triglycerides 139, HDL 39, LDL 141. A1c 5.8%. Bun 17, creatinine 1.1. Other chemistries and TSH normal.  Myoview 03/21/16: Study Highlights    The left ventricular ejection fraction is normal (55-65%).  Nuclear stress EF: 62%.  Blood pressure demonstrated a hypertensive response to exercise.  T wave inversion was noted during stress in the II, aVF, V4, V5, V6, V3 and V2 leads.  The study is normal.  This is a low risk study.   Echo: 03/18/16: Study Conclusions  - Left ventricle: The cavity size was normal. Wall thickness was   increased in a pattern of mild LVH. Systolic function was normal.   The estimated ejection fraction was in the range of 55% to 60%.   Wall motion was normal; there were no regional wall motion   abnormalities. Doppler parameters are consistent with abnormal   left ventricular relaxation (grade 1 diastolic dysfunction). - Left atrium: The atrium was mildly dilated.  Impressions:  - Normal LV systolic function; grade 1 diastolic dysfunction; mild   LVH; mild LAE.  Assessment/Plan:  1. Coronary disease. Status post non-ST elevation myocardial infarction October 2013. Status post stenting of tandem lesions in the proximal and mid LAD. Repeat cardiac catheterization in January 2016 showed continued stent patency and normal right heart pressures. Myoview in May 2018 was normal. Continue medical therapy.  2. Dyslipidemia. She is intolerant to statins. On Zetia.  3. Idiopathic pulmonary fibrosis - followed by pulmonary.   Normal right heart pressures by cath. No RV  enlargement or pulmonary HTN noted on Echo.   4. Obstructive sleep apnea.  5. History of colitis. Still having abdominal complaints. To follow up with GI.  6. 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 Beta blocker reduced. Event monitor benign. Patient to see Dr. Lovena Le in follow up.   Current medicines are reviewed with the patient today.  The patient does not have concerns regarding medicines.  The following changes have been made:  See above  Labs/ tests ordered today include:    No orders of the defined types were placed in this encounter.    Disposition:  follow up in 6 months.

## 2016-12-23 ENCOUNTER — Encounter: Payer: Self-pay | Admitting: Cardiology

## 2016-12-23 ENCOUNTER — Ambulatory Visit (INDEPENDENT_AMBULATORY_CARE_PROVIDER_SITE_OTHER): Payer: Medicare Other | Admitting: Cardiology

## 2016-12-23 VITALS — BP 126/78 | HR 69 | Ht 67.0 in | Wt 199.8 lb

## 2016-12-23 DIAGNOSIS — I2510 Atherosclerotic heart disease of native coronary artery without angina pectoris: Secondary | ICD-10-CM | POA: Diagnosis not present

## 2016-12-23 DIAGNOSIS — I10 Essential (primary) hypertension: Secondary | ICD-10-CM

## 2016-12-23 NOTE — Patient Instructions (Addendum)
Continue your current therapy  I will see you in 6 months.   

## 2016-12-24 ENCOUNTER — Other Ambulatory Visit: Payer: Self-pay | Admitting: Radiology

## 2016-12-24 DIAGNOSIS — N6312 Unspecified lump in the right breast, upper inner quadrant: Secondary | ICD-10-CM | POA: Diagnosis not present

## 2016-12-24 DIAGNOSIS — N6311 Unspecified lump in the right breast, upper outer quadrant: Secondary | ICD-10-CM | POA: Diagnosis not present

## 2016-12-24 DIAGNOSIS — N641 Fat necrosis of breast: Secondary | ICD-10-CM | POA: Diagnosis not present

## 2016-12-26 ENCOUNTER — Ambulatory Visit (INDEPENDENT_AMBULATORY_CARE_PROVIDER_SITE_OTHER): Payer: Medicare Other | Admitting: Internal Medicine

## 2016-12-26 ENCOUNTER — Encounter: Payer: Self-pay | Admitting: Internal Medicine

## 2016-12-26 VITALS — BP 132/68 | HR 70 | Ht 67.0 in | Wt 199.6 lb

## 2016-12-26 DIAGNOSIS — I2510 Atherosclerotic heart disease of native coronary artery without angina pectoris: Secondary | ICD-10-CM | POA: Diagnosis not present

## 2016-12-26 DIAGNOSIS — R079 Chest pain, unspecified: Secondary | ICD-10-CM | POA: Diagnosis not present

## 2016-12-26 NOTE — Progress Notes (Signed)
HPI Jody Ruiz returns today for followup. I saw her in the hospital when she presented with palpitations and was found to have frequent atrial ectopy. She has a diagnosis of pulmonary fibrosis. She also has a h/o native CAD. Since DC she has been stable. Her palpitations are infrequent and she denies chest pain. No edema. No syncope.  Allergies  Allergen Reactions  . Brilinta [Ticagrelor] Shortness Of Breath, Other (See Comments) and Cough    Reaction:  Fatigue  . Lisinopril Shortness Of Breath and Cough  . Statins Other (See Comments)    Reaction:  Muscle pain/weakness  . Zetia [Ezetimibe] Other (See Comments)    Reaction:  Muscle pain/weakness   . Macrodantin [Nitrofurantoin Macrocrystal] Rash  . Sulfa Antibiotics Rash     Current Outpatient Prescriptions  Medication Sig Dispense Refill  . amLODipine (NORVASC) 5 MG tablet Take 5 mg by mouth daily.    Marland Kitchen aspirin EC 81 MG tablet Take 81 mg by mouth daily.    . isosorbide mononitrate (IMDUR) 60 MG 24 hr tablet Take 60 mg by mouth at bedtime.    . metoprolol succinate (TOPROL-XL) 25 MG 24 hr tablet Take 1 tablet (25 mg total) by mouth daily. 30 tablet 6  . nitroGLYCERIN (NITROSTAT) 0.4 MG SL tablet Place 1 tablet (0.4 mg total) under the tongue every 5 (five) minutes x 3 doses as needed for chest pain. 25 tablet 12   No current facility-administered medications for this visit.      Past Medical History:  Diagnosis Date  . Arthritis   . Borderline hypothyroidism   . CAD (coronary artery disease)    a. 04/2012 NSTEMI/Cath:  pLAD 90%, mLAD 70-80% (long) (3x16 & 2.5x28 Promus DES to p/m LAD), pD1 70-80%, pRI 30% (small), CFX 20%, OM1 40%, mOM2 30%, pRCA 30%, PDA 30-40%, pPLB 70%, then mid 90%, EF 65%;  b. 03/2013 Abnl CL w/apical isch;  c. 04/2013 Cath: LM nl, LAD patent stents, LCX <20, RCA 30p PDA 40-50 EF 55-60% ->Cont Med Rx.  c. CP s/p LHC with stable dz and patent stents--> Rx medically   . Cataract   . Dyspnea    a. CP  and SOB 06/2012 => Ticagrelor changed to Plavix  . HLD (hyperlipidemia)    intolerant to statins  . Hx of adenomatous colonic polyps   . Hx of echocardiogram    a. Echo 2/14:  mild LVH, EF 55-65%, Gr 1 diast dysfn, mild LAE  . Hypertension   . Idiopathic pulmonary fibrosis   . Internal hemorrhoids   . Lung nodules   . Microscopic colitis   . Myocardial infarction ([place]) 2013  . Obesity   . OSA (obstructive sleep apnea)    a. on CPAP  . Sleep apnea    cpap    ROS:   All systems reviewed and negative except as noted in the HPI.   Past Surgical History:  Procedure Laterality Date  . APPENDECTOMY  1994  . BREAST BIOPSY  1965, 1975   bilateral  . CARDIAC CATHETERIZATION     x4  . CHOLECYSTECTOMY  2004  . CORONARY STENT PLACEMENT  04/2012   x2  . KNEE ARTHROSCOPY  2000   bilateral  . LEFT AND RIGHT HEART CATHETERIZATION WITH CORONARY ANGIOGRAM N/A 08/02/2014   Procedure: LEFT AND RIGHT HEART CATHETERIZATION WITH CORONARY ANGIOGRAM;  Surgeon: Burnell Blanks, MD;  Location: [facility] CATH LAB;  Service: Cardiovascular;  Laterality: N/A;  . LEFT HEART  CATHETERIZATION WITH CORONARY ANGIOGRAM N/A 04/29/2012   Procedure: LEFT HEART CATHETERIZATION WITH CORONARY ANGIOGRAM;  Surgeon: Peter M Martinique, MD;  Location: [facility] CATH LAB;  Service: Cardiovascular;  Laterality: N/A;  . LEFT HEART CATHETERIZATION WITH CORONARY ANGIOGRAM N/A 08/03/2012   Procedure: LEFT HEART CATHETERIZATION WITH CORONARY ANGIOGRAM;  Surgeon: Burnell Blanks, MD;  Location: [facility] CATH LAB;  Service: Cardiovascular;  Laterality: N/A;  . LEFT HEART CATHETERIZATION WITH CORONARY ANGIOGRAM N/A 04/12/2013   Procedure: LEFT HEART CATHETERIZATION WITH CORONARY ANGIOGRAM;  Surgeon: Peter M Martinique, MD;  Location: [facility] CATH LAB;  Service: Cardiovascular;  Laterality: N/A;  . PERCUTANEOUS CORONARY STENT INTERVENTION (PCI-S) N/A 04/29/2012   Procedure: PERCUTANEOUS CORONARY STENT INTERVENTION (PCI-S);  Surgeon: Peter M Martinique,  MD;  Location: [facility] CATH LAB;  Service: Cardiovascular;  Laterality: N/A;  . SHOULDER ARTHROSCOPY  2012   left  . vaginal polypectomy  2004  . VENTRAL HERNIA REPAIR  2005     Family History  Problem Relation Age of Onset  . Breast cancer Sister   . Prostate cancer Father   . Colon polyps Father   . Heart disease Father 35       CAD, died of ESRD  . Kidney disease Father   . Colon cancer Sister 28  . Heart disease Mother 81       Died acute MI  . Heart disease Brother 24       CABG  . Esophageal cancer Neg Hx   . Rectal cancer Neg Hx   . Stomach cancer Neg Hx      Social History   Social History  . Marital status: Married    Spouse name: N/A  . Number of children: 3  . Years of education: N/A   Occupational History  . Retired Therapist, sports Retired   Social History Main Topics  . Smoking status: Never Smoker  . Smokeless tobacco: Never Used  . Alcohol use No  . Drug use: No  . Sexual activity: Not on file   Other Topics Concern  . Not on file   Social History Narrative  . No narrative on file     BP 132/68   Pulse 70   Ht 5\' 7"  (1.702 m)   Wt 199 lb 9.6 oz (90.5 kg)   LMP  (LMP Unknown)   SpO2 97%   BMI 31.26 kg/m   Physical Exam:  Well appearing NAD HEENT: Unremarkable Neck:  No JVD, no thyromegally Lymphatics:  No adenopathy Back:  No CVA tenderness Lungs:  Clear except for scattered bilateral rales HEART:  Regular rate rhythm, no murmurs, no rubs, no clicks Abd:  soft, positive bowel sounds, no organomegally, no rebound, no guarding Ext:  2 plus pulses, no edema, no cyanosis, no clubbing Skin:  No rashes no nodules Neuro:  CN II through XII intact, motor grossly intact  EKG - nsr with PAC's  DEVICE  Normal device function.  See PaceArt for details.   Assess/Plan: 1. Palpitations - she has atrial ectopy but no evidence of atrial fib. I have tried to reassure her and recommended she avoid caffeine. 2. Chest pain - her symptoms are non-cardiac. Will  follow.  3. CAD - she denies anginal symptoms. Will follow. I have encouraged her to increase her physical activity.  Mikle [email or web address].D.

## 2016-12-26 NOTE — Patient Instructions (Signed)
Medication Instructions:  Your physician recommends that you continue on your current medications as directed. Please refer to the Current Medication list given to you today.   Labwork: None Ordered   Testing/Procedures: None Ordered   Follow-Up: Your physician wants you to follow-up in: 6 months with Dr. Taylor. You will receive a reminder letter in the mail two months in advance. If you don't receive a letter, please call our office to schedule the follow-up appointment.    Any Other Special Instructions Will Be Listed Below (If Applicable).     If you need a refill on your cardiac medications before your next appointment, please call your pharmacy.   

## 2016-12-29 ENCOUNTER — Telehealth: Payer: Self-pay | Admitting: Physician Assistant

## 2016-12-29 NOTE — Telephone Encounter (Signed)
Her problem in April was with C. difficile diarrhea which has been treated. Not sure what is going on now. No over the phone advice. She is welcome to schedule a routine appointment with a GI extender, myself, or her PCP

## 2016-12-29 NOTE — Telephone Encounter (Signed)
Spoke with pt and she is aware and will keep the appt scheduled for 01/02/17@3pm .

## 2016-12-29 NOTE — Telephone Encounter (Signed)
Pt states she is not feeling well. States she is having discomfort in her throat that goes all the way down to her stomach. Also states she has pain in the right side of her abdomen. Her stools are formed at this point but she reports she is not able to eat because she feels bad. Pt states she doesn't want to wind up in the same shape she was in when she was hospitalized in April.

## 2016-12-29 NOTE — Telephone Encounter (Signed)
Left message for pt to call back. Pt tentatively scheduled to see Alonza Bogus PA 01/02/17@3pm .

## 2017-01-02 ENCOUNTER — Encounter: Payer: Self-pay | Admitting: Gastroenterology

## 2017-01-02 ENCOUNTER — Ambulatory Visit (INDEPENDENT_AMBULATORY_CARE_PROVIDER_SITE_OTHER): Payer: Medicare Other | Admitting: Gastroenterology

## 2017-01-02 VITALS — BP 132/70 | HR 72 | Ht 67.0 in | Wt 200.0 lb

## 2017-01-02 DIAGNOSIS — I2510 Atherosclerotic heart disease of native coronary artery without angina pectoris: Secondary | ICD-10-CM | POA: Diagnosis not present

## 2017-01-02 DIAGNOSIS — R109 Unspecified abdominal pain: Secondary | ICD-10-CM | POA: Insufficient documentation

## 2017-01-02 DIAGNOSIS — K219 Gastro-esophageal reflux disease without esophagitis: Secondary | ICD-10-CM | POA: Diagnosis not present

## 2017-01-02 DIAGNOSIS — R142 Eructation: Secondary | ICD-10-CM

## 2017-01-02 MED ORDER — RANITIDINE HCL 150 MG PO TABS: 150.0000 mg | ORAL_TABLET | Freq: Two times a day (BID) | ORAL | 3 refills | Status: DC

## 2017-01-02 NOTE — Progress Notes (Signed)
01/02/2017 Jody Ruiz [id number] 11-Nov-1940   HISTORY OF PRESENT ILLNESS:  This is a pleasant 76 year old female who is known to Dr. Henrene Pastor. She gets colonoscopies regularly due to personal history of polyps and family history of colon cancer. She is up-to-date with that. She recently went through a severe bout of diarrhea for several, thought to be due to microscopic colitis, but then was found to have C. difficile. Please see office note from [place] on 12/02/2016 for further details regarding that issue.  Anyway, she is here today with some upper GI complaints. She says that for the past few weeks she's been having a significant amount of belching. She describes a burning all the way from her throat all way down into her abdomen. She reports poor appetite and just feels nauseous/sick on her stomach.  She is not on any acid medication.  She does admit that she is under a lot of stress as she is the primary caregiver for several members of her family that have been sick recently.  She also mentions some right-sided abdominal pain that seems to come and go.   Past Medical History:  Diagnosis Date  . Arthritis   . Borderline hypothyroidism   . CAD (coronary artery disease)    a. 04/2012 NSTEMI/Cath:  pLAD 90%, mLAD 70-80% (long) (3x16 & 2.5x28 Promus DES to p/m LAD), pD1 70-80%, pRI 30% (small), CFX 20%, OM1 40%, mOM2 30%, pRCA 30%, PDA 30-40%, pPLB 70%, then mid 90%, EF 65%;  b. 03/2013 Abnl CL w/apical isch;  c. 04/2013 Cath: LM nl, LAD patent stents, LCX <20, RCA 30p PDA 40-50 EF 55-60% ->Cont Med Rx.  c. CP s/p LHC with stable dz and patent stents--> Rx medically   . Cataract   . Dyspnea    a. CP and SOB 06/2012 => Ticagrelor changed to Plavix  . History of Clostridium difficile infection   . HLD (hyperlipidemia)    intolerant to statins  . Hx of adenomatous colonic polyps   . Hx of echocardiogram    a. Echo 2/14:  mild LVH, EF 55-65%, Gr 1 diast dysfn, mild LAE  .  Hypertension   . Idiopathic pulmonary fibrosis   . Internal hemorrhoids   . Lung nodules   . Microscopic colitis   . Myocardial infarction ([place]) 2013  . Obesity   . OSA (obstructive sleep apnea)    a. on CPAP  . Sleep apnea    cpap   Past Surgical History:  Procedure Laterality Date  . APPENDECTOMY  1994  . BREAST BIOPSY  1965, 1975   bilateral  . CARDIAC CATHETERIZATION     x4  . CHOLECYSTECTOMY  2004  . CORONARY STENT PLACEMENT  04/2012   x2  . KNEE ARTHROSCOPY  2000   bilateral  . LEFT AND RIGHT HEART CATHETERIZATION WITH CORONARY ANGIOGRAM N/A 08/02/2014   Procedure: LEFT AND RIGHT HEART CATHETERIZATION WITH CORONARY ANGIOGRAM;  Surgeon: Burnell Blanks, MD;  Location: [facility] CATH LAB;  Service: Cardiovascular;  Laterality: N/A;  . LEFT HEART CATHETERIZATION WITH CORONARY ANGIOGRAM N/A 04/29/2012   Procedure: LEFT HEART CATHETERIZATION WITH CORONARY ANGIOGRAM;  Surgeon: Peter M Martinique, MD;  Location: [facility] CATH LAB;  Service: Cardiovascular;  Laterality: N/A;  . LEFT HEART CATHETERIZATION WITH CORONARY ANGIOGRAM N/A 08/03/2012   Procedure: LEFT HEART CATHETERIZATION WITH CORONARY ANGIOGRAM;  Surgeon: Burnell Blanks, MD;  Location: [facility] CATH LAB;  Service: Cardiovascular;  Laterality: N/A;  . LEFT HEART CATHETERIZATION WITH  CORONARY ANGIOGRAM N/A 04/12/2013   Procedure: LEFT HEART CATHETERIZATION WITH CORONARY ANGIOGRAM;  Surgeon: Peter M Martinique, MD;  Location: [facility] CATH LAB;  Service: Cardiovascular;  Laterality: N/A;  . PERCUTANEOUS CORONARY STENT INTERVENTION (PCI-S) N/A 04/29/2012   Procedure: PERCUTANEOUS CORONARY STENT INTERVENTION (PCI-S);  Surgeon: Peter M Martinique, MD;  Location: [facility] CATH LAB;  Service: Cardiovascular;  Laterality: N/A;  . SHOULDER ARTHROSCOPY  2012   left  . vaginal polypectomy  2004  . VENTRAL HERNIA REPAIR  2005    reports that she has never smoked. She has never used smokeless tobacco. She reports that she does not drink alcohol or use  drugs. family history includes Breast cancer in her sister; Colon cancer (age of onset: 70) in her sister; Colon polyps in her father; Heart disease (age of onset: 38) in her brother; Heart disease (age of onset: 40) in her mother; Heart disease (age of onset: 49) in her father; Kidney disease in her father; Prostate cancer in her father. Allergies  Allergen Reactions  . Brilinta [Ticagrelor] Shortness Of Breath, Other (See Comments) and Cough    Reaction:  Fatigue  . Lisinopril Shortness Of Breath and Cough  . Statins Other (See Comments)    Reaction:  Muscle pain/weakness  . Zetia [Ezetimibe] Other (See Comments)    Reaction:  Muscle pain/weakness   . Macrodantin [Nitrofurantoin Macrocrystal] Rash  . Sulfa Antibiotics Rash      Outpatient Encounter Prescriptions as of 01/02/2017  Medication Sig  . amLODipine (NORVASC) 5 MG tablet Take 5 mg by mouth daily.  Marland Kitchen aspirin EC 81 MG tablet Take 81 mg by mouth daily.  . isosorbide mononitrate (IMDUR) 60 MG 24 hr tablet Take 60 mg by mouth at bedtime.  . metoprolol succinate (TOPROL-XL) 25 MG 24 hr tablet Take 1 tablet (25 mg total) by mouth daily.  . nitroGLYCERIN (NITROSTAT) 0.4 MG SL tablet Place 1 tablet (0.4 mg total) under the tongue every 5 (five) minutes x 3 doses as needed for chest pain.  . ranitidine (ZANTAC) 150 MG tablet Take 1 tablet (150 mg total) by mouth 2 (two) times daily.   No facility-administered encounter medications on file as of 01/02/2017.      REVIEW OF SYSTEMS  : All other systems reviewed and negative except where noted in the History of Present Illness.   PHYSICAL EXAM: BP 132/70   Pulse 72   Ht 5\' 7"  (1.702 m) Comment: 59ft7  Wt 200 lb (90.7 kg)   LMP  (LMP Unknown)   BMI 31.32 kg/m  General: Well developed white female in no acute distress; belching throughout our visit. Head: Normocephalic and atraumatic Eyes:  Sclerae anicteric, conjunctiva pink. Ears: Normal auditory acuity Lungs: Clear throughout  to auscultation; no increased WOB. Heart: Regular rate and rhythm Abdomen: Soft, non-distended.  Normal bowel sounds.  Non-tender. Musculoskeletal: Symmetrical with no gross deformities  Skin: No lesions on visible extremities Extremities: No edema  Neurological: Alert oriented x 4, grossly non-focal Psychological:  Alert and cooperative. Normal mood and affect  ASSESSMENT AND PLAN: -Belching, burning in esophagus/GERD:  Not currently on reflux medication.  Will Place her on Zantac 150 mg twice a day we'll try to avoid PPIs for now due to her recent history of C. difficile. We'll schedule for EGD with Dr. Henrene Pastor to evaluate for esophagitis, etc. -Right side abdominal pain:  Non-tender on exam today.  ? Due to scar tissue or mesh from previous surgeries.  Will monitor for now.  *The  risks, benefits, and alternatives to EGD were discussed with the patient and she consents to proceed.   CC:  Burnard Bunting, MD

## 2017-01-02 NOTE — Patient Instructions (Addendum)
You have been scheduled for an endoscopy. Please follow written instructions given to you at your visit today. If you use inhalers (even only as needed), please bring them with you on the day of your procedure. Your physician has requested that you go to www.startemmi.com and enter the access code given to you at your visit today. This web site gives a general overview about your procedure. However, you should still follow specific instructions given to you by our office regarding your preparation for the procedure.  We have sent the following medications to your pharmacy for you to pick up at your convenience: [facility].  1. Ranitidine ( Zantac) 150 mg.

## 2017-01-03 NOTE — Progress Notes (Signed)
Reviewed

## 2017-01-08 ENCOUNTER — Telehealth: Payer: Self-pay | Admitting: Gastroenterology

## 2017-01-08 ENCOUNTER — Other Ambulatory Visit: Payer: Medicare Other

## 2017-01-08 DIAGNOSIS — R197 Diarrhea, unspecified: Secondary | ICD-10-CM

## 2017-01-08 MED ORDER — ONDANSETRON HCL 4 MG PO TABS: 4.0000 mg | ORAL_TABLET | Freq: Three times a day (TID) | ORAL | 1 refills | Status: AC | PRN

## 2017-01-08 NOTE — Telephone Encounter (Signed)
I spoke to the pt and she is aware that the prescription has been sent to the pharmacy.  She is also aware that she needs to come in ASAP for C diff testing.  If results are not back or if she is positive for C diff EGD will need to be rescheduled.

## 2017-01-08 NOTE — Telephone Encounter (Signed)
Dr Henrene Pastor this is a patient of yours that is scheduled for an ENDO on Monday.  She is calling today with nausea and diarrhea.  She is taking imodium for the diarrhea which has helped but would like something for nausea.  Can we send Zofran?  She was last seen on 6/29 by Janett Billow.  She is taking zantac BID but is not able to keep it down due to the nausea and vomiting.  Do we need to cancel the EGD for Monday due to the recent C Diff and new diarrhea?

## 2017-01-08 NOTE — Telephone Encounter (Signed)
1. okay to prescribe Zofran 2. Have her come in today to submit stool for Clostridium difficile testing 3. Let her know that we may need to reschedule her EGD if Clostridium difficile is positive. Hopefully will have the results back by and of day tomorrow if she comes in today. If she cannot come in today, then simply reschedule the EGD for later date. Thanks

## 2017-01-09 LAB — CLOSTRIDIUM DIFFICILE BY PCR: CDIFFPCR: NOT DETECTED

## 2017-01-12 ENCOUNTER — Ambulatory Visit (AMBULATORY_SURGERY_CENTER): Payer: Medicare Other | Admitting: Internal Medicine

## 2017-01-12 ENCOUNTER — Encounter: Payer: Self-pay | Admitting: Internal Medicine

## 2017-01-12 ENCOUNTER — Encounter: Payer: Medicare Other | Admitting: Internal Medicine

## 2017-01-12 VITALS — BP 138/82 | HR 59 | Temp 97.8°F | Resp 15 | Ht 67.0 in | Wt 200.0 lb

## 2017-01-12 DIAGNOSIS — R142 Eructation: Secondary | ICD-10-CM

## 2017-01-12 DIAGNOSIS — R1013 Epigastric pain: Secondary | ICD-10-CM

## 2017-01-12 DIAGNOSIS — E669 Obesity, unspecified: Secondary | ICD-10-CM | POA: Diagnosis not present

## 2017-01-12 DIAGNOSIS — I252 Old myocardial infarction: Secondary | ICD-10-CM | POA: Diagnosis not present

## 2017-01-12 DIAGNOSIS — G4733 Obstructive sleep apnea (adult) (pediatric): Secondary | ICD-10-CM | POA: Diagnosis not present

## 2017-01-12 DIAGNOSIS — I2510 Atherosclerotic heart disease of native coronary artery without angina pectoris: Secondary | ICD-10-CM | POA: Diagnosis not present

## 2017-01-12 DIAGNOSIS — R110 Nausea: Secondary | ICD-10-CM | POA: Diagnosis not present

## 2017-01-12 DIAGNOSIS — R1084 Generalized abdominal pain: Secondary | ICD-10-CM

## 2017-01-12 DIAGNOSIS — K219 Gastro-esophageal reflux disease without esophagitis: Secondary | ICD-10-CM | POA: Diagnosis not present

## 2017-01-12 MED ORDER — SODIUM CHLORIDE 0.9 % IV SOLN: 500.0000 mL | INTRAVENOUS | Status: DC

## 2017-01-12 MED ORDER — OMEPRAZOLE 20 MG PO CPDR: 40.0000 mg | DELAYED_RELEASE_CAPSULE | Freq: Every day | ORAL | 6 refills | Status: DC

## 2017-01-12 NOTE — Op Note (Signed)
[place] Patient Name: Christyann Manolis Procedure Date: 01/12/2017 8:19 AM MRN: [id number] Endoscopist: Docia Chuck. Henrene Pastor , MD Age: 76 Referring MD:  Date of Birth: [birth year] Gender: Female Account #: [id number] Procedure:                Upper GI endoscopy Indications:              Abdominal pain, Dyspepsia Medicines:                Monitored Anesthesia Care Procedure:                Pre-Anesthesia Assessment:                           - Prior to the procedure, a History and Physical                            was performed, and patient medications and                            allergies were reviewed. The patient's tolerance of                            previous anesthesia was also reviewed. The risks                            and benefits of the procedure and the sedation                            options and risks were discussed with the patient.                            All questions were answered, and informed consent                            was obtained. Prior Anticoagulants: The patient has                            taken no previous anticoagulant or antiplatelet                            agents. ASA Grade Assessment: III - A patient with                            mild systemic disease. After reviewing the risks                            and benefits, the patient was deemed in                            satisfactory condition to undergo the procedure.                           After obtaining informed consent, the endoscope was  passed under direct vision. Throughout the                            procedure, the patient's blood pressure, pulse, and                            oxygen saturations were monitored continuously. The                            Endoscope was introduced through the mouth, and                            advanced to the second part of duodenum. The upper                            GI endoscopy was accomplished  without difficulty.                            The patient tolerated the procedure well. Scope In: Scope Out: Findings:                 The esophagus was normal.                           The stomach was normal.                           The examined duodenum was normal.                           The cardia and gastric fundus were normal on                            retroflexion. Complications:            No immediate complications. Estimated Blood Loss:     Estimated blood loss: none. Impression:               - Normal esophagus.                           - Normal stomach.                           - Normal examined duodenum.                           - No specimens collected. Recommendation:           1. Schedule CT scan of the abdomen and pelvis. NO                            IV contrast. "Abdominal pain"                           2. Prescribe omeprazole 40 mg; #30; 6 refills; take  1 by mouth daily                           3. Office follow-up with Dr. Henrene Pastor in 4-6 weeks. Docia Chuck. Henrene Pastor, MD 01/12/2017 9:01:02 AM This report has been signed electronically.

## 2017-01-12 NOTE — Patient Instructions (Signed)
YOU HAD AN ENDOSCOPIC PROCEDURE TODAY AT [facility]:   Refer to the procedure report that was given to you for any specific questions about what was found during the examination.  If the procedure report does not answer your questions, please call your gastroenterologist to clarify.  If you requested that your care partner not be given the details of your procedure findings, then the procedure report has been included in a sealed envelope for you to review at your convenience later.  YOU SHOULD EXPECT: Some feelings of bloating in the abdomen. Passage of more gas than usual.  Walking can help get rid of the air that was put into your GI tract during the procedure and reduce the bloating. If you had a lower endoscopy (such as a colonoscopy or flexible sigmoidoscopy) you may notice spotting of blood in your stool or on the toilet paper. If you underwent a bowel prep for your procedure, you may not have a normal bowel movement for a few days.  Please Note:  You might notice some irritation and congestion in your nose or some drainage.  This is from the oxygen used during your procedure.  There is no need for concern and it should clear up in a day or so.  SYMPTOMS TO REPORT IMMEDIATELY:    Following upper endoscopy (EGD)  Vomiting of blood or coffee ground material  New chest pain or pain under the shoulder blades  Painful or persistently difficult swallowing  New shortness of breath  Fever of 100F or higher  Black, tarry-looking stools  For urgent or emergent issues, a gastroenterologist can be reached at any hour by calling [phone].  Will start omeprazole 40 mg daily. You will be scheduled for a CT scan of the abdomen and pelvis. Dr. Blanch Media office will call you with that appointment and you will have a follow up in 4-6 weeks.   DIET:  We do recommend a small meal at first, but then you may proceed to your regular diet.  Drink plenty of fluids but you should avoid  alcoholic beverages for 24 hours.  ACTIVITY:  You should plan to take it easy for the rest of today and you should NOT DRIVE or use heavy machinery until tomorrow (because of the sedation medicines used during the test).    FOLLOW UP: Our staff will call the number listed on your records the next business day following your procedure to check on you and address any questions or concerns that you may have regarding the information given to you following your procedure. If we do not reach you, we will leave a message.  However, if you are feeling well and you are not experiencing any problems, there is no need to return our call.  We will assume that you have returned to your regular daily activities without incident.  If any biopsies were taken you will be contacted by phone or by letter within the next 1-3 weeks.  Please call us at [phone] if you have not heard about the biopsies in 3 weeks.    SIGNATURES/CONFIDENTIALITY: You and/or your care partner have signed paperwork which will be entered into your electronic medical record.  These signatures attest to the fact that that the information above on your After Visit Summary has been reviewed and is understood.  Full responsibility of the confidentiality of this discharge information lies with you and/or your care-partner.  Thank you for letting us take care of your healthcare needs  today.

## 2017-01-12 NOTE — Progress Notes (Signed)
Pt's states no medical or surgical changes since previsit or office visit.  No egg or soy allergy  

## 2017-01-12 NOTE — Progress Notes (Signed)
To PACU VSS, Report to RN 

## 2017-01-13 ENCOUNTER — Encounter (HOSPITAL_COMMUNITY): Payer: Self-pay

## 2017-01-13 ENCOUNTER — Emergency Department (HOSPITAL_COMMUNITY): Payer: Medicare Other

## 2017-01-13 ENCOUNTER — Inpatient Hospital Stay (HOSPITAL_COMMUNITY)
Admission: EM | Admit: 2017-01-13 | Discharge: 2017-01-22 | DRG: 683 | Disposition: A | Discharge status: Home / Self Care | Payer: Medicare Other | Attending: Internal Medicine | Admitting: Internal Medicine

## 2017-01-13 ENCOUNTER — Telehealth: Payer: Self-pay | Admitting: *Deleted

## 2017-01-13 ENCOUNTER — Other Ambulatory Visit: Payer: Self-pay

## 2017-01-13 DIAGNOSIS — E860 Dehydration: Secondary | ICD-10-CM | POA: Diagnosis present

## 2017-01-13 DIAGNOSIS — Z881 Allergy status to other antibiotic agents status: Secondary | ICD-10-CM

## 2017-01-13 DIAGNOSIS — N390 Urinary tract infection, site not specified: Secondary | ICD-10-CM | POA: Diagnosis present

## 2017-01-13 DIAGNOSIS — R109 Unspecified abdominal pain: Secondary | ICD-10-CM

## 2017-01-13 DIAGNOSIS — R1013 Epigastric pain: Secondary | ICD-10-CM | POA: Diagnosis present

## 2017-01-13 DIAGNOSIS — I252 Old myocardial infarction: Secondary | ICD-10-CM

## 2017-01-13 DIAGNOSIS — R110 Nausea: Secondary | ICD-10-CM | POA: Diagnosis not present

## 2017-01-13 DIAGNOSIS — Z9989 Dependence on other enabling machines and devices: Secondary | ICD-10-CM

## 2017-01-13 DIAGNOSIS — N184 Chronic kidney disease, stage 4 (severe): Secondary | ICD-10-CM | POA: Diagnosis present

## 2017-01-13 DIAGNOSIS — N179 Acute kidney failure, unspecified: Principal | ICD-10-CM | POA: Diagnosis present

## 2017-01-13 DIAGNOSIS — J84112 Idiopathic pulmonary fibrosis: Secondary | ICD-10-CM | POA: Diagnosis not present

## 2017-01-13 DIAGNOSIS — Z8719 Personal history of other diseases of the digestive system: Secondary | ICD-10-CM

## 2017-01-13 DIAGNOSIS — N189 Chronic kidney disease, unspecified: Secondary | ICD-10-CM

## 2017-01-13 DIAGNOSIS — Z6833 Body mass index (BMI) 33.0-33.9, adult: Secondary | ICD-10-CM

## 2017-01-13 DIAGNOSIS — Z955 Presence of coronary angioplasty implant and graft: Secondary | ICD-10-CM

## 2017-01-13 DIAGNOSIS — E785 Hyperlipidemia, unspecified: Secondary | ICD-10-CM | POA: Diagnosis present

## 2017-01-13 DIAGNOSIS — I480 Paroxysmal atrial fibrillation: Secondary | ICD-10-CM | POA: Diagnosis not present

## 2017-01-13 DIAGNOSIS — G4733 Obstructive sleep apnea (adult) (pediatric): Secondary | ICD-10-CM | POA: Diagnosis present

## 2017-01-13 DIAGNOSIS — N200 Calculus of kidney: Secondary | ICD-10-CM | POA: Diagnosis not present

## 2017-01-13 DIAGNOSIS — Z79899 Other long term (current) drug therapy: Secondary | ICD-10-CM

## 2017-01-13 DIAGNOSIS — I4891 Unspecified atrial fibrillation: Secondary | ICD-10-CM

## 2017-01-13 DIAGNOSIS — Z9889 Other specified postprocedural states: Secondary | ICD-10-CM

## 2017-01-13 DIAGNOSIS — R195 Other fecal abnormalities: Secondary | ICD-10-CM

## 2017-01-13 DIAGNOSIS — R918 Other nonspecific abnormal finding of lung field: Secondary | ICD-10-CM | POA: Diagnosis present

## 2017-01-13 DIAGNOSIS — R142 Eructation: Secondary | ICD-10-CM | POA: Diagnosis present

## 2017-01-13 DIAGNOSIS — Z9049 Acquired absence of other specified parts of digestive tract: Secondary | ICD-10-CM

## 2017-01-13 DIAGNOSIS — E876 Hypokalemia: Secondary | ICD-10-CM | POA: Diagnosis not present

## 2017-01-13 DIAGNOSIS — R1031 Right lower quadrant pain: Secondary | ICD-10-CM | POA: Diagnosis not present

## 2017-01-13 DIAGNOSIS — R198 Other specified symptoms and signs involving the digestive system and abdomen: Secondary | ICD-10-CM | POA: Diagnosis present

## 2017-01-13 DIAGNOSIS — Z882 Allergy status to sulfonamides status: Secondary | ICD-10-CM

## 2017-01-13 DIAGNOSIS — K219 Gastro-esophageal reflux disease without esophagitis: Secondary | ICD-10-CM | POA: Diagnosis present

## 2017-01-13 DIAGNOSIS — Z7982 Long term (current) use of aspirin: Secondary | ICD-10-CM

## 2017-01-13 DIAGNOSIS — R634 Abnormal weight loss: Secondary | ICD-10-CM | POA: Diagnosis present

## 2017-01-13 DIAGNOSIS — Z888 Allergy status to other drugs, medicaments and biological substances status: Secondary | ICD-10-CM

## 2017-01-13 DIAGNOSIS — E669 Obesity, unspecified: Secondary | ICD-10-CM | POA: Diagnosis present

## 2017-01-13 DIAGNOSIS — D649 Anemia, unspecified: Secondary | ICD-10-CM | POA: Diagnosis present

## 2017-01-13 DIAGNOSIS — I2510 Atherosclerotic heart disease of native coronary artery without angina pectoris: Secondary | ICD-10-CM | POA: Diagnosis present

## 2017-01-13 DIAGNOSIS — R151 Fecal smearing: Secondary | ICD-10-CM | POA: Diagnosis not present

## 2017-01-13 DIAGNOSIS — N059 Unspecified nephritic syndrome with unspecified morphologic changes: Secondary | ICD-10-CM

## 2017-01-13 DIAGNOSIS — I495 Sick sinus syndrome: Secondary | ICD-10-CM | POA: Diagnosis present

## 2017-01-13 DIAGNOSIS — K52839 Microscopic colitis, unspecified: Secondary | ICD-10-CM | POA: Diagnosis present

## 2017-01-13 DIAGNOSIS — R197 Diarrhea, unspecified: Secondary | ICD-10-CM | POA: Diagnosis present

## 2017-01-13 DIAGNOSIS — Z8619 Personal history of other infectious and parasitic diseases: Secondary | ICD-10-CM

## 2017-01-13 DIAGNOSIS — I129 Hypertensive chronic kidney disease with stage 1 through stage 4 chronic kidney disease, or unspecified chronic kidney disease: Secondary | ICD-10-CM | POA: Diagnosis present

## 2017-01-13 DIAGNOSIS — I10 Essential (primary) hypertension: Secondary | ICD-10-CM | POA: Diagnosis present

## 2017-01-13 DIAGNOSIS — I4892 Unspecified atrial flutter: Secondary | ICD-10-CM | POA: Diagnosis not present

## 2017-01-13 DIAGNOSIS — G8929 Other chronic pain: Secondary | ICD-10-CM | POA: Diagnosis present

## 2017-01-13 DIAGNOSIS — R0602 Shortness of breath: Secondary | ICD-10-CM

## 2017-01-13 LAB — URINALYSIS, ROUTINE W REFLEX MICROSCOPIC
Bilirubin Urine: NEGATIVE
GLUCOSE, UA: NEGATIVE mg/dL
KETONES UR: NEGATIVE mg/dL
Nitrite: NEGATIVE
PROTEIN: 100 mg/dL — AB
Specific Gravity, Urine: 1.008 (ref 1.005–1.030)
pH: 5 (ref 5.0–8.0)

## 2017-01-13 LAB — COMPREHENSIVE METABOLIC PANEL
ALBUMIN: 3.8 g/dL (ref 3.5–5.0)
ALT: 11 U/L — AB (ref 14–54)
AST: 16 U/L (ref 15–41)
Alkaline Phosphatase: 69 U/L (ref 38–126)
Anion gap: 11 (ref 5–15)
BUN: 66 mg/dL — AB (ref 6–20)
CHLORIDE: 109 mmol/L (ref 101–111)
CO2: 19 mmol/L — AB (ref 22–32)
Calcium: 9 mg/dL (ref 8.9–10.3)
Creatinine, Ser: 5.71 mg/dL — ABNORMAL HIGH (ref 0.44–1.00)
GFR calc Af Amer: 8 mL/min — ABNORMAL LOW (ref >60)
GFR calc non Af Amer: 7 mL/min — ABNORMAL LOW (ref >60)
GLUCOSE: 119 mg/dL — AB (ref 65–99)
POTASSIUM: 3.5 mmol/L (ref 3.5–5.1)
SODIUM: 139 mmol/L (ref 135–145)
Total Bilirubin: 0.6 mg/dL (ref 0.3–1.2)
Total Protein: 7.6 g/dL (ref 6.5–8.1)

## 2017-01-13 LAB — CBC
HEMATOCRIT: 31.1 % — AB (ref 36.0–46.0)
Hemoglobin: 10.6 g/dL — ABNORMAL LOW (ref 12.0–15.0)
MCH: 29.7 pg (ref 26.0–34.0)
MCHC: 34.1 g/dL (ref 30.0–36.0)
MCV: 87.1 fL (ref 78.0–100.0)
Platelets: 310 10*3/uL (ref 150–400)
RBC: 3.57 MIL/uL — ABNORMAL LOW (ref 3.87–5.11)
RDW: 12.6 % (ref 11.5–15.5)
WBC: 8.2 10*3/uL (ref 4.0–10.5)

## 2017-01-13 LAB — OSMOLALITY: OSMOLALITY: 309 mosm/kg — AB (ref 275–295)

## 2017-01-13 LAB — OSMOLALITY, URINE: OSMOLALITY UR: 274 mosm/kg — AB (ref 300–900)

## 2017-01-13 LAB — LIPASE, BLOOD: LIPASE: 77 U/L — AB (ref 11–51)

## 2017-01-13 LAB — NA AND K (SODIUM & POTASSIUM), RAND UR
POTASSIUM UR: 14 mmol/L
Sodium, Ur: 65 mmol/L

## 2017-01-13 LAB — CREATININE, URINE, RANDOM: CREATININE, URINE: 75.42 mg/dL

## 2017-01-13 LAB — CK: Total CK: 51 U/L (ref 38–234)

## 2017-01-13 MED ORDER — SODIUM CHLORIDE 0.9 % IV SOLN
INTRAVENOUS | Status: DC
  Administered 2017-01-13 – 2017-01-15 (×5): via INTRAVENOUS

## 2017-01-13 MED ORDER — ACETAMINOPHEN 650 MG RE SUPP: 650.0000 mg | Freq: Four times a day (QID) | RECTAL | Status: DC | PRN

## 2017-01-13 MED ORDER — SIMETHICONE 40 MG/0.6ML PO SUSP
40.0000 mg | Freq: Four times a day (QID) | ORAL | Status: DC
  Administered 2017-01-14: 40 mg via ORAL
  Filled 2017-01-13 (×8): qty 0.6

## 2017-01-13 MED ORDER — IOPAMIDOL (ISOVUE-300) INJECTION 61%
INTRAVENOUS | Status: AC
  Administered 2017-01-13: 30 mL via ORAL
  Filled 2017-01-13: qty 30

## 2017-01-13 MED ORDER — ONDANSETRON 4 MG PO TBDP
4.0000 mg | ORAL_TABLET | Freq: Once | ORAL | Status: AC | PRN
  Administered 2017-01-13: 4 mg via ORAL
  Filled 2017-01-13: qty 1

## 2017-01-13 MED ORDER — HEPARIN SODIUM (PORCINE) 5000 UNIT/ML IJ SOLN
5000.0000 [IU] | Freq: Three times a day (TID) | INTRAMUSCULAR | Status: DC
  Filled 2017-01-13: qty 1

## 2017-01-13 MED ORDER — SACCHAROMYCES BOULARDII 250 MG PO CAPS
250.0000 mg | ORAL_CAPSULE | Freq: Two times a day (BID) | ORAL | Status: DC
  Administered 2017-01-13 – 2017-01-22 (×18): 250 mg via ORAL
  Filled 2017-01-13 (×18): qty 1

## 2017-01-13 MED ORDER — ACETAMINOPHEN 325 MG PO TABS
650.0000 mg | ORAL_TABLET | Freq: Four times a day (QID) | ORAL | Status: DC | PRN
  Administered 2017-01-16 – 2017-01-17 (×2): 650 mg via ORAL
  Filled 2017-01-13 (×2): qty 2

## 2017-01-13 MED ORDER — ASPIRIN EC 81 MG PO TBEC
81.0000 mg | DELAYED_RELEASE_TABLET | Freq: Every day | ORAL | Status: DC
  Administered 2017-01-14 – 2017-01-22 (×9): 81 mg via ORAL
  Filled 2017-01-13 (×9): qty 1

## 2017-01-13 MED ORDER — ISOSORBIDE MONONITRATE ER 60 MG PO TB24
60.0000 mg | ORAL_TABLET | Freq: Every day | ORAL | Status: DC
Start: 2017-01-13 — End: 2017-01-14
  Administered 2017-01-13: 60 mg via ORAL
  Filled 2017-01-13: qty 1

## 2017-01-13 MED ORDER — AMLODIPINE BESYLATE 5 MG PO TABS: 5.0000 mg | ORAL_TABLET | Freq: Every day | ORAL | Status: DC

## 2017-01-13 MED ORDER — ONDANSETRON HCL 4 MG PO TABS: 4.0000 mg | ORAL_TABLET | Freq: Four times a day (QID) | ORAL | Status: DC | PRN

## 2017-01-13 MED ORDER — DICYCLOMINE HCL 10 MG PO CAPS
10.0000 mg | ORAL_CAPSULE | Freq: Three times a day (TID) | ORAL | Status: DC | PRN
  Administered 2017-01-13 – 2017-01-14 (×2): 10 mg via ORAL
  Filled 2017-01-13 (×2): qty 1

## 2017-01-13 MED ORDER — ONDANSETRON HCL 4 MG/2ML IJ SOLN
4.0000 mg | Freq: Four times a day (QID) | INTRAMUSCULAR | Status: DC | PRN
  Administered 2017-01-14 – 2017-01-21 (×5): 4 mg via INTRAVENOUS
  Filled 2017-01-13 (×4): qty 2

## 2017-01-13 MED ORDER — AMITRIPTYLINE HCL 25 MG PO TABS: 25.0000 mg | ORAL_TABLET | Freq: Every day | ORAL | Status: DC

## 2017-01-13 MED ORDER — IOPAMIDOL (ISOVUE-300) INJECTION 61%
30.0000 mL | Freq: Once | INTRAVENOUS | Status: AC | PRN
  Administered 2017-01-13: 30 mL via ORAL

## 2017-01-13 MED ORDER — SODIUM CHLORIDE 0.9 % IV SOLN
Freq: Once | INTRAVENOUS | Status: AC
  Administered 2017-01-13: 1000 mL via INTRAVENOUS

## 2017-01-13 MED ORDER — FAMOTIDINE IN NACL 20-0.9 MG/50ML-% IV SOLN
20.0000 mg | INTRAVENOUS | Status: DC
  Administered 2017-01-13: 20 mg via INTRAVENOUS
  Filled 2017-01-13 (×2): qty 50

## 2017-01-13 MED ORDER — METOPROLOL SUCCINATE ER 25 MG PO TB24
25.0000 mg | ORAL_TABLET | Freq: Every day | ORAL | Status: DC
  Administered 2017-01-14 – 2017-01-18 (×5): 25 mg via ORAL
  Filled 2017-01-13 (×5): qty 1

## 2017-01-13 NOTE — ED Provider Notes (Signed)
[place] DEPT Provider Note   CSN: [id number] Arrival date & time: 01/13/17  7564     History   Chief Complaint Chief Complaint  Patient presents with  . Abdominal Pain  . Nausea    HPI Jody Ruiz is a 76 y.o. female.  The history is provided by the patient. No language interpreter was used.  Abdominal Pain      Jody Ruiz is a 76 y.o. female who presents to the Emergency Department complaining of AP.  She reports RLQ abdominal pain since January.  Pain is constant in nature with intermittent worsening.  She has associated nausea and decreased appetite. She reports 35 pound weight loss since January. She has been worked up by GI for pain and had a positive C. difficile [place] in April and completed treatment. She no longer has diarrhea. No fevers, vomiting, dysuria. She had an endoscopy yesterday because she had some burning chest discomfort as well. Endoscopy was negative. Following her endoscopy she had worsening of her right lower quadrant pain was unable to sleep last night secondary to pain. She has a history of cholecystectomy, appendectomy, umbilical hernia repair, CAD, CKD.    Past Medical History:  Diagnosis Date  . Arthritis   . Borderline hypothyroidism   . CAD (coronary artery disease)    a. 04/2012 NSTEMI/Cath:  pLAD 90%, mLAD 70-80% (long) (3x16 & 2.5x28 Promus DES to p/m LAD), pD1 70-80%, pRI 30% (small), CFX 20%, OM1 40%, mOM2 30%, pRCA 30%, PDA 30-40%, pPLB 70%, then mid 90%, EF 65%;  b. 03/2013 Abnl CL w/apical isch;  c. 04/2013 Cath: LM nl, LAD patent stents, LCX <20, RCA 30p PDA 40-50 EF 55-60% ->Cont Med Rx.  c. CP s/p LHC with stable dz and patent stents--> Rx medically   . Cataract   . Dyspnea    a. CP and SOB 06/2012 => Ticagrelor changed to Plavix  . History of Clostridium difficile infection   . HLD (hyperlipidemia)    intolerant to statins  . Hx of adenomatous colonic polyps   . Hx of echocardiogram    a. Echo 2/14:  mild LVH, EF  55-65%, Gr 1 diast dysfn, mild LAE  . Hypertension   . Idiopathic pulmonary fibrosis   . Internal hemorrhoids   . Lung nodules   . Microscopic colitis   . Myocardial infarction ([place]) 2013  . Obesity   . OSA (obstructive sleep apnea)    a. on CPAP  . Sleep apnea    cpap    Patient Active Problem List   Diagnosis Date Noted  . AKI (acute kidney injury) ([place]) 01/13/2017  . Belching 01/02/2017  . Gastroesophageal reflux disease 01/02/2017  . Right sided abdominal pain 01/02/2017  . Palpitations   . Tachycardia-bradycardia syndrome ([place])   . Chest pain 11/05/2016  . Renal insufficiency 10/16/2016  . B12 deficiency 10/16/2016  . Enteritis due to Clostridium difficile   . Diarrhea with dehydration 10/14/2016  . Microscopic colitis 10/02/2016  . Hx of adenomatous colonic polyps 10/02/2016  . Altered mental status   . CAD (coronary artery disease)   . Interstitial lung disease ([place])   . HLD (hyperlipidemia)   . Precordial pain 07/17/2013  . Midsternal chest pain 07/17/2013  . Pulmonary nodule 11/22/2012  . 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 OSA (obstructive sleep apnea) 10/05/2012  . IPF (idiopathic pulmonary fibrosis) ([place]) 08/26/2012  . Coronary atherosclerosis of native coronary artery 05/04/2012  . Hypertension   . Obesity  Past Surgical History:  Procedure Laterality Date  . APPENDECTOMY  1994  . BREAST BIOPSY  1965, 1975   bilateral  . CARDIAC CATHETERIZATION     x4  . CHOLECYSTECTOMY  2004  . CORONARY STENT PLACEMENT  04/2012   x2  . KNEE ARTHROSCOPY  2000   bilateral  . LEFT AND RIGHT HEART CATHETERIZATION WITH CORONARY ANGIOGRAM N/A 08/02/2014   Procedure: LEFT AND RIGHT HEART CATHETERIZATION WITH CORONARY ANGIOGRAM;  Surgeon: Burnell Blanks, MD;  Location: [facility] CATH LAB;  Service: Cardiovascular;  Laterality: N/A;  . LEFT HEART CATHETERIZATION WITH CORONARY ANGIOGRAM N/A 04/29/2012   Procedure: LEFT HEART CATHETERIZATION WITH CORONARY  ANGIOGRAM;  Surgeon: Peter M Martinique, MD;  Location: [facility] CATH LAB;  Service: Cardiovascular;  Laterality: N/A;  . LEFT HEART CATHETERIZATION WITH CORONARY ANGIOGRAM N/A 08/03/2012   Procedure: LEFT HEART CATHETERIZATION WITH CORONARY ANGIOGRAM;  Surgeon: Burnell Blanks, MD;  Location: [facility] CATH LAB;  Service: Cardiovascular;  Laterality: N/A;  . LEFT HEART CATHETERIZATION WITH CORONARY ANGIOGRAM N/A 04/12/2013   Procedure: LEFT HEART CATHETERIZATION WITH CORONARY ANGIOGRAM;  Surgeon: Peter M Martinique, MD;  Location: [facility] CATH LAB;  Service: Cardiovascular;  Laterality: N/A;  . PERCUTANEOUS CORONARY STENT INTERVENTION (PCI-S) N/A 04/29/2012   Procedure: PERCUTANEOUS CORONARY STENT INTERVENTION (PCI-S);  Surgeon: Peter M Martinique, MD;  Location: [facility] CATH LAB;  Service: Cardiovascular;  Laterality: N/A;  . SHOULDER ARTHROSCOPY  2012   left  . vaginal polypectomy  2004  . VENTRAL HERNIA REPAIR  2005    OB History    No data available       Home Medications    Prior to Admission medications   Medication Sig Start Date End Date Taking? Authorizing Provider  amLODipine (NORVASC) 5 MG tablet Take 5 mg by mouth daily.   Yes [provider]  aspirin EC 81 MG tablet Take 81 mg by mouth daily.   Yes [provider]  isosorbide mononitrate (IMDUR) 60 MG 24 hr tablet Take 60 mg by mouth at bedtime.   Yes [provider]  metoprolol succinate (TOPROL-XL) 25 MG 24 hr tablet Take 1 tablet (25 mg total) by mouth daily. 11/08/16  Yes Cheryln Manly, NP  nitroGLYCERIN (NITROSTAT) 0.4 MG SL tablet Place 1 tablet (0.4 mg total) under the tongue every 5 (five) minutes x 3 doses as needed for chest pain. 08/03/14  Yes Eileen Stanford, PA-C  omeprazole (PRILOSEC) 20 MG capsule Take 2 capsules (40 mg total) by mouth daily. 01/12/17  Yes Irene Shipper, MD  ondansetron (ZOFRAN) 4 MG tablet Take 1 tablet (4 mg total) by mouth every 8 (eight) hours as needed for nausea or vomiting. 01/08/17   Yes Irene Shipper, MD  ranitidine (ZANTAC) 150 MG tablet Take 1 tablet (150 mg total) by mouth 2 (two) times daily. Patient not taking: Reported on 01/13/2017 01/02/17   Zehr, Laban Emperor, PA-C    Family History Family History  Problem Relation Age of Onset  . Breast cancer Sister   . Prostate cancer Father   . Colon polyps Father   . Heart disease Father 33       CAD, died of ESRD  . Kidney disease Father   . Colon cancer Sister 47  . Heart disease Mother 2       Died acute MI  . Heart disease Brother 76       CABG  . Esophageal cancer Neg Hx   . Rectal cancer Neg Hx   .  Stomach cancer Neg Hx     Social History Social History  Substance Use Topics  . Smoking status: Never Smoker  . Smokeless tobacco: Never Used     Comment: second hand smoke   . Alcohol use No     Allergies   Brilinta [ticagrelor]; Lisinopril; Statins; Zetia [ezetimibe]; Macrodantin [nitrofurantoin macrocrystal]; and Sulfa antibiotics   Review of Systems Review of Systems  Gastrointestinal: Positive for abdominal pain.  All other systems reviewed and are negative.    Physical Exam Updated Vital Signs BP 137/67   Pulse 69   Temp 97.7 F (36.5 C) (Oral)   Resp 14   LMP  (LMP Unknown)   SpO2 98%   Physical Exam  Constitutional: She is oriented to person, place, and time. She appears well-developed and well-nourished.  HENT:  Head: Normocephalic and atraumatic.  Cardiovascular: Normal rate and regular rhythm.   No murmur heard. Pulmonary/Chest: Effort normal and breath sounds normal. No respiratory distress.  Abdominal: Soft. There is no rebound and no guarding.  Moderate RLQ tenderness, no guarding/rebound.   Musculoskeletal: She exhibits no edema or tenderness.  Neurological: She is alert and oriented to person, place, and time.  Skin: Skin is warm and dry.  Psychiatric: She has a normal mood and affect. Her behavior is normal.  Nursing note and vitals reviewed.    ED Treatments /  Results  Labs (all labs ordered are listed, but only abnormal results are displayed) Labs Reviewed  LIPASE, BLOOD - Abnormal; Notable for the following:       Result Value   Lipase 77 (*)    All other components within normal limits  COMPREHENSIVE METABOLIC PANEL - Abnormal; Notable for the following:    CO2 19 (*)    Glucose, Bld 119 (*)    BUN 66 (*)    Creatinine, Ser 5.71 (*)    ALT 11 (*)    GFR calc non Af Amer 7 (*)    GFR calc Af Amer 8 (*)    All other components within normal limits  CBC - Abnormal; Notable for the following:    RBC 3.57 (*)    Hemoglobin 10.6 (*)    HCT 31.1 (*)    All other components within normal limits  URINALYSIS, ROUTINE W REFLEX MICROSCOPIC - Abnormal; Notable for the following:    Hgb urine dipstick LARGE (*)    Protein, ur 100 (*)    Leukocytes, UA TRACE (*)    Bacteria, UA RARE (*)    Squamous Epithelial / LPF 0-5 (*)    Non Squamous Epithelial 0-5 (*)    All other components within normal limits  URINE CULTURE    EKG  EKG Interpretation None       Radiology Ct Abdomen Pelvis Wo Contrast  Result Date: 01/13/2017 CLINICAL DATA:  Right lower quadrant abdominal pain since January with weight loss EXAM: CT ABDOMEN AND PELVIS WITHOUT CONTRAST TECHNIQUE: Multidetector CT imaging of the abdomen and pelvis was performed following the standard protocol without IV contrast. COMPARISON:  11/18/2003 FINDINGS: Lower chest: There is subpleural honeycombing in the posterior costophrenic sulci. Mediastinal right lower lobe, also seen on chest CT from 2017. Coronary atherosclerosis Hepatobiliary: No focal liver abnormality.Cholecystectomy. Pancreas: Unremarkable. Spleen: Unremarkable. Adrenals/Urinary Tract: Negative adrenals. No hydronephrosis or ureteral stone. Tiny lower pole calculus on the left. Interpolar parenchymal bar. Presumed hemorrhagic cyst in the lower pole left kidney measuring 8 mm. Unremarkable bladder. Stomach/Bowel: No bowel  obstruction or inflammation. No notable  diverticula. Appendectomy per history. Vascular/Lymphatic: No acute vascular abnormality. Atherosclerosis of the aorta and iliacs. No acute or focal finding. No mass or adenopathy. Reproductive:No pathologic findings. Other: No ascites or pneumoperitoneum. Umbilical hernia repaired with mesh that is lax and bulging. Musculoskeletal: No acute abnormalities. Simple appearing intramuscular lipoma in the lateral left gluteal musculature measuring up to 7 x 5 cm. Advanced lumbar disc degeneration with levoscoliosis. IMPRESSION: 1. No specific explanation for abdominal pain. 2. Umbilical hernia repair with bulging of the mesh. 3. Subpleural fibrosis at the bases, reference chest CT 06/02/2016. 4. Advanced lumbar disc degeneration with levoscoliosis. 5. 7 cm intramuscular left gluteal lipoma. 6. Tiny left renal calculus. Electronically Signed   By: Monte Fantasia M.D.   On: 01/13/2017 13:45   Dg Chest 2 View  Result Date: 01/13/2017 CLINICAL DATA:  Chronic abdominal pain, belching and nausea for months. Endoscopy yesterday. EXAM: CHEST  2 VIEW COMPARISON:  Chest x-rays dated 11/05/2016 05/23/2015. FINDINGS: Heart size is upper normal, stable. Overall cardiomediastinal silhouette is stable. Atherosclerotic changes noted at the aortic arch. Evaluation of the lungs somewhat limited by patient motion artifact. Lungs appear grossly clear. No pleural effusion or pneumothorax seen. Mild degenerative spurring throughout the thoracic spine. No acute or suspicious osseous finding. Stable chronic elevation of the right hemidiaphragm. IMPRESSION: 1. No active cardiopulmonary disease. No evidence of pneumonia or pulmonary edema. 2. Aortic atherosclerosis. Electronically Signed   By: Franki Cabot M.D.   On: 01/13/2017 12:08    Procedures Procedures (including critical care time)  Medications Ordered in ED Medications  ondansetron (ZOFRAN-ODT) disintegrating tablet 4 mg (4 mg Oral  Given 01/13/17 0943)  0.9 %  sodium chloride infusion (1,000 mLs Intravenous New Bag/Given 01/13/17 1115)  iopamidol (ISOVUE-300) 61 % injection 30 mL (30 mLs Oral Contrast Given 01/13/17 1111)     Initial Impression / Assessment and Plan / ED Course  I have reviewed the triage vital signs and the nursing notes.  Pertinent labs & imaging results that were available during my care of the patient were reviewed by me and considered in my medical decision making (see chart for details).     Patient here for for progressive abdominal pain since January. She does have some right lower quadrant tenderness on examination. BMP demonstrates worsening of renal function with rise in creatinine from 1.6 to 5.7. CT abdomen with no evidence of mass or urinary obstruction. Providing IV fluid hydration. Hospitalist consulted for admission for further treatment. Patient updated of findings of studies she is in agreement with admission.  Final Clinical Impressions(s) / ED Diagnoses   Final diagnoses:  Acute renal failure, unspecified acute renal failure type ([place])  Right lower quadrant abdominal pain    New Prescriptions New Prescriptions   No medications on file     Quintella Reichert, MD 01/13/17 1628

## 2017-01-13 NOTE — ED Triage Notes (Signed)
Pt c/o chronic abdominal pain, belching, and nausea x "months."  Pain score 6/10 increasing to 10/10.  Pt reports she had an endoscopy w/ yesterday "without significant findings."  Per chart review, Endo MD thinks this is chronic and not related to procedure.

## 2017-01-13 NOTE — Telephone Encounter (Signed)
  Follow up Call-  Call back number 01/12/2017 06/06/2015  Post procedure Call Back phone  # [phone]  Permission to leave phone message Yes Yes  Some recent data might be hidden     Patient questions:  Do you have a fever, pain , or abdominal swelling? Yes.   Pain Score  6 * Patient states that it was as bad as a 10 last evening  Have you tolerated food without any problems? No. States can't eat.  Have you been able to return to your normal activities? No.  Do you have any questions about your discharge instructions: Diet   No. Medications  No. Follow up visit  No.  Do you have questions or concerns about your Care? Yes.   Patient was urged to go to the ER, but she was not keen on the idea.  Actions: * If pain score is 4 or above: Physician/ provider Notified : Scarlette Shorts, MD.

## 2017-01-13 NOTE — ED Notes (Signed)
Patient complaining of lower abdominal pain that she has been dealing with since January.  Patient had endoscopy yesterday and today is having lower abdominal pain that she rates 6 of 10.  Patient states that she has lost a total of 35lbs since January and can't get any relief from her pain.

## 2017-01-13 NOTE — Telephone Encounter (Signed)
Pain is unrelated to the procedure and chronic. She has CT scans scheduled to evaluate this. Thank you for the notification. Nothing further to be done at this time

## 2017-01-13 NOTE — ED Notes (Signed)
Pt is aware a urine sample is needed, but is unable to provide one at this time. 

## 2017-01-13 NOTE — H&P (Signed)
[facility] History and Physical   [name] ZOX:[id number]   PCP: Burnard Bunting, MD DOB: 1940/09/17   DOA: 01/13/2017   DOS: 01/13/2017   DOS: the patient was seen and examined on 01/13/2017  Patient coming from: The patient is coming from home.  Chief Complaint: abdominal pain  HPI: Jody Ruiz is a 76 y.o. female with Past medical history of Coronary artery disease, hypertension, idiopathic pulmonary fibrosis, history of C. difficile, microscopic colitis, intractable abdominal chronic pain, obstructive sleep apnea, obesity. Patient presents with complaints of abdominal pain. This pain has been ongoing for last few months. Patient actually has been following up with GI as an outpatient and had an upper GI endoscopy on 01/12/2017. Patient mentions that she has lost 24 pounds since January and her pain continues to get worse. Pain is primarily located in the lower abdomen, sharp and crampy in nature. Patient is unable to eat anything by mouth. Patient mentions that she has to lose watery bowel motions today. She has been having multiple episodes of diarrhea at home as well. Patient also states that she has significant passage of gas yesterday. No fever no chills reported. No chest pain reported no shortness of breath no cough reported. Patient mentioned she is compliant with her medical regimen. Denies taking any NSAIDs ibuprofen Motrin or Aleve. Denies also taking any herbal supplement.  ED Course: Patient presented with above mentioned complaint, CT scan of the abdomen performed, no acute abnormal 80 found. Patient had acute kidney injury on chronic kidney disease and therefore was referred for admission  At her baseline ambulates without support And is independent for most of her ADL; manages her medication on her own.  Review of Systems: as mentioned in the history of present illness.  All other systems reviewed and are negative.  Past Medical History:    Diagnosis Date  . Arthritis   . Borderline hypothyroidism   . CAD (coronary artery disease)    a. 04/2012 NSTEMI/Cath:  pLAD 90%, mLAD 70-80% (long) (3x16 & 2.5x28 Promus DES to p/m LAD), pD1 70-80%, pRI 30% (small), CFX 20%, OM1 40%, mOM2 30%, pRCA 30%, PDA 30-40%, pPLB 70%, then mid 90%, EF 65%;  b. 03/2013 Abnl CL w/apical isch;  c. 04/2013 Cath: LM nl, LAD patent stents, LCX <20, RCA 30p PDA 40-50 EF 55-60% ->Cont Med Rx.  c. CP s/p LHC with stable dz and patent stents--> Rx medically   . Cataract   . Dyspnea    a. CP and SOB 06/2012 => Ticagrelor changed to Plavix  . History of Clostridium difficile infection   . HLD (hyperlipidemia)    intolerant to statins  . Hx of adenomatous colonic polyps   . Hx of echocardiogram    a. Echo 2/14:  mild LVH, EF 55-65%, Gr 1 diast dysfn, mild LAE  . Hypertension   . Idiopathic pulmonary fibrosis   . Internal hemorrhoids   . Lung nodules   . Microscopic colitis   . Myocardial infarction ([place]) 2013  . Obesity   . OSA (obstructive sleep apnea)    a. on CPAP  . Sleep apnea    cpap   Past Surgical History:  Procedure Laterality Date  . APPENDECTOMY  1994  . BREAST BIOPSY  1965, 1975   bilateral  . CARDIAC CATHETERIZATION     x4  . CHOLECYSTECTOMY  2004  . CORONARY STENT PLACEMENT  04/2012   x2  . KNEE ARTHROSCOPY  2000   bilateral  .  LEFT AND RIGHT HEART CATHETERIZATION WITH CORONARY ANGIOGRAM N/A 08/02/2014   Procedure: LEFT AND RIGHT HEART CATHETERIZATION WITH CORONARY ANGIOGRAM;  Surgeon: Burnell Blanks, MD;  Location: [facility] CATH LAB;  Service: Cardiovascular;  Laterality: N/A;  . LEFT HEART CATHETERIZATION WITH CORONARY ANGIOGRAM N/A 04/29/2012   Procedure: LEFT HEART CATHETERIZATION WITH CORONARY ANGIOGRAM;  Surgeon: Peter M Martinique, MD;  Location: [facility] CATH LAB;  Service: Cardiovascular;  Laterality: N/A;  . LEFT HEART CATHETERIZATION WITH CORONARY ANGIOGRAM N/A 08/03/2012   Procedure: LEFT HEART CATHETERIZATION WITH CORONARY  ANGIOGRAM;  Surgeon: Burnell Blanks, MD;  Location: [facility] CATH LAB;  Service: Cardiovascular;  Laterality: N/A;  . LEFT HEART CATHETERIZATION WITH CORONARY ANGIOGRAM N/A 04/12/2013   Procedure: LEFT HEART CATHETERIZATION WITH CORONARY ANGIOGRAM;  Surgeon: Peter M Martinique, MD;  Location: [facility] CATH LAB;  Service: Cardiovascular;  Laterality: N/A;  . PERCUTANEOUS CORONARY STENT INTERVENTION (PCI-S) N/A 04/29/2012   Procedure: PERCUTANEOUS CORONARY STENT INTERVENTION (PCI-S);  Surgeon: Peter M Martinique, MD;  Location: [facility] CATH LAB;  Service: Cardiovascular;  Laterality: N/A;  . SHOULDER ARTHROSCOPY  2012   left  . vaginal polypectomy  2004  . VENTRAL HERNIA REPAIR  2005   Social History:  reports that she has never smoked. She has never used smokeless tobacco. She reports that she does not drink alcohol or use drugs.  Allergies  Allergen Reactions  . Brilinta [Ticagrelor] Shortness Of Breath, Other (See Comments) and Cough    Reaction:  Fatigue  . Lisinopril Shortness Of Breath and Cough  . Statins Other (See Comments)    Reaction:  Muscle pain/weakness  . Zetia [Ezetimibe] Other (See Comments)    Reaction:  Muscle pain/weakness   . Macrodantin [Nitrofurantoin Macrocrystal] Rash  . Sulfa Antibiotics Rash     Family History  Problem Relation Age of Onset  . Breast cancer Sister   . Prostate cancer Father   . Colon polyps Father   . Heart disease Father 86       CAD, died of ESRD  . Kidney disease Father   . Colon cancer Sister 14  . Heart disease Mother 18       Died acute MI  . Heart disease Brother 75       CABG  . Esophageal cancer Neg Hx   . Rectal cancer Neg Hx   . Stomach cancer Neg Hx      Prior to Admission medications   Medication Sig Start Date End Date Taking? Authorizing Provider  amLODipine (NORVASC) 5 MG tablet Take 5 mg by mouth daily.   Yes [provider]  aspirin EC 81 MG tablet Take 81 mg by mouth daily.   Yes [provider]  isosorbide  mononitrate (IMDUR) 60 MG 24 hr tablet Take 60 mg by mouth at bedtime.   Yes [provider]  metoprolol succinate (TOPROL-XL) 25 MG 24 hr tablet Take 1 tablet (25 mg total) by mouth daily. 11/08/16  Yes Cheryln Manly, NP  nitroGLYCERIN (NITROSTAT) 0.4 MG SL tablet Place 1 tablet (0.4 mg total) under the tongue every 5 (five) minutes x 3 doses as needed for chest pain. 08/03/14  Yes Eileen Stanford, PA-C  omeprazole (PRILOSEC) 20 MG capsule Take 2 capsules (40 mg total) by mouth daily. 01/12/17  Yes Irene Shipper, MD  ondansetron (ZOFRAN) 4 MG tablet Take 1 tablet (4 mg total) by mouth every 8 (eight) hours as needed for nausea or vomiting. 01/08/17  Yes Irene Shipper, MD  ranitidine (ZANTAC) 150 MG tablet Take 1 tablet (150 mg total) by mouth 2 (two) times daily. Patient not taking: Reported on 01/13/2017 01/02/17   Loralie Champagne, PA-C    Physical Exam: Vitals:   01/13/17 1142 01/13/17 1435 01/13/17 1602 01/13/17 1648  BP: (!) 163/76 139/69 137/67 (!) 160/83  Pulse: 65 69  69  Resp: 15 (!) 21 14 14   Temp: 98 F (36.7 C) 97.7 F (36.5 C)  97.7 F (36.5 C)  TempSrc: Oral Oral  Oral  SpO2: 100% 98%  100%    General: Alert, Awake and Oriented to Time, Place and Person. Appear in mild distress, affect appropriate Eyes: PERRL, Conjunctiva normal ENT: Oral Mucosa clear moist. Neck: no JVD, no Abnormal Mass Or lumps Cardiovascular: S1 and S2 Present, no Murmur, Peripheral Pulses Present Respiratory: normal respiratory effort, Bilateral Air entry equal and Decreased, no use of accessory muscle, clear to Auscultation, no Crackles, no wheezes Abdomen: Bowel Sound present, Soft and mild diffuse tenderness,  Skin: no redness, no Rash, no induration Extremities: no Pedal edema, no calf tenderness Neurologic: Grossly no focal neuro deficit. Bilaterally Equal motor strength  Labs on Admission:  CBC:  Recent Labs Lab 01/13/17 1020  WBC 8.2  HGB 10.6*  HCT 31.1*  MCV 87.1  PLT  644   Basic Metabolic Panel:  Recent Labs Lab 01/13/17 1020  NA 139  K 3.5  CL 109  CO2 19*  GLUCOSE 119*  BUN 66*  CREATININE 5.71*  CALCIUM 9.0   GFR: Estimated Creatinine Clearance: 10 mL/min (A) (by C-G formula based on SCr of 5.71 mg/dL (H)). Liver Function Tests:  Recent Labs Lab 01/13/17 1020  AST 16  ALT 11*  ALKPHOS 69  BILITOT 0.6  PROT 7.6  ALBUMIN 3.8    Recent Labs Lab 01/13/17 1020  LIPASE 77*   No results for input(s): AMMONIA in the last 168 hours. Coagulation Profile: No results for input(s): INR, PROTIME in the last 168 hours. Cardiac Enzymes: No results for input(s): CKTOTAL, CKMB, CKMBINDEX, TROPONINI in the last 168 hours. BNP (last 3 results) No results for input(s): PROBNP in the last 8760 hours. HbA1C: No results for input(s): HGBA1C in the last 72 hours. CBG: No results for input(s): GLUCAP in the last 168 hours. Lipid Profile: No results for input(s): CHOL, HDL, LDLCALC, TRIG, CHOLHDL, LDLDIRECT in the last 72 hours. Thyroid Function Tests: No results for input(s): TSH, T4TOTAL, FREET4, T3FREE, THYROIDAB in the last 72 hours. Anemia Panel: No results for input(s): VITAMINB12, FOLATE, FERRITIN, TIBC, IRON, RETICCTPCT in the last 72 hours. Urine analysis:    Component Value Date/Time   COLORURINE YELLOW 01/13/2017 1109   APPEARANCEUR CLEAR 01/13/2017 1109   LABSPEC 1.008 01/13/2017 1109   PHURINE 5.0 01/13/2017 1109   GLUCOSEU NEGATIVE 01/13/2017 1109   HGBUR LARGE (A) 01/13/2017 1109   BILIRUBINUR NEGATIVE 01/13/2017 1109   [place] 01/13/2017 1109   PROTEINUR 100 (A) 01/13/2017 1109   NITRITE NEGATIVE 01/13/2017 1109   LEUKOCYTESUR TRACE (A) 01/13/2017 1109    Radiological Exams on Admission: Ct Abdomen Pelvis Wo Contrast  Result Date: 01/13/2017 CLINICAL DATA:  Right lower quadrant abdominal pain since January with weight loss EXAM: CT ABDOMEN AND PELVIS WITHOUT CONTRAST TECHNIQUE: Multidetector CT imaging of  the abdomen and pelvis was performed following the standard protocol without IV contrast. COMPARISON:  11/18/2003 FINDINGS: Lower chest: There is subpleural honeycombing in the posterior costophrenic sulci. Mediastinal right lower lobe, also seen on chest CT from  2017. Coronary atherosclerosis Hepatobiliary: No focal liver abnormality.Cholecystectomy. Pancreas: Unremarkable. Spleen: Unremarkable. Adrenals/Urinary Tract: Negative adrenals. No hydronephrosis or ureteral stone. Tiny lower pole calculus on the left. Interpolar parenchymal bar. Presumed hemorrhagic cyst in the lower pole left kidney measuring 8 mm. Unremarkable bladder. Stomach/Bowel: No bowel obstruction or inflammation. No notable diverticula. Appendectomy per history. Vascular/Lymphatic: No acute vascular abnormality. Atherosclerosis of the aorta and iliacs. No acute or focal finding. No mass or adenopathy. Reproductive:No pathologic findings. Other: No ascites or pneumoperitoneum. Umbilical hernia repaired with mesh that is lax and bulging. Musculoskeletal: No acute abnormalities. Simple appearing intramuscular lipoma in the lateral left gluteal musculature measuring up to 7 x 5 cm. Advanced lumbar disc degeneration with levoscoliosis. IMPRESSION: 1. No specific explanation for abdominal pain. 2. Umbilical hernia repair with bulging of the mesh. 3. Subpleural fibrosis at the bases, reference chest CT 06/02/2016. 4. Advanced lumbar disc degeneration with levoscoliosis. 5. 7 cm intramuscular left gluteal lipoma. 6. Tiny left renal calculus. Electronically Signed   By: Monte Fantasia M.D.   On: 01/13/2017 13:45   Dg Chest 2 View  Result Date: 01/13/2017 CLINICAL DATA:  Chronic abdominal pain, belching and nausea for months. Endoscopy yesterday. EXAM: CHEST  2 VIEW COMPARISON:  Chest x-rays dated 11/05/2016 05/23/2015. FINDINGS: Heart size is upper normal, stable. Overall cardiomediastinal silhouette is stable. Atherosclerotic changes noted at the  aortic arch. Evaluation of the lungs somewhat limited by patient motion artifact. Lungs appear grossly clear. No pleural effusion or pneumothorax seen. Mild degenerative spurring throughout the thoracic spine. No acute or suspicious osseous finding. Stable chronic elevation of the right hemidiaphragm. IMPRESSION: 1. No active cardiopulmonary disease. No evidence of pneumonia or pulmonary edema. 2. Aortic atherosclerosis. Electronically Signed   By: Franki Cabot M.D.   On: 01/13/2017 12:08   Assessment/Plan 1. Acute kidney injury superimposed on chronic kidney disease ([place]) Presents with acute kidney injury. Renal function has worsened at least since March 2018. Baseline creatinine 1.5. Today of 5.7. BUN also significantly elevated. CT abdomen does not show any evidence of bowel obstruction. Most likely this is prerenal etiology with poor by mouth intake. We will hydrate the patient with IV fluid and monitor renal function. Avoid nephrotoxic medication. We will get urine studies as well as osmolality. Urine culture is currently pending, urine analysis shows evidence of pyuria although the patient denies having any urinary symptoms and is concerned regarding her history of C. difficile and would like to hold off on antibiotics at present which I totally agree with.  2. Chronic abdominal pain. History of microscopic colitis. Diarrhea with dehydration. The patient has chronic abdominal pain has been worked about as an outpatient. Her pain more likely appears to be either a irritable bowel syndrome or functional. Her diarrhea could be because of microscopic colitis or irritable bowel syndrome. Patient currently is not on any treatment for microscopic colitis. She has to lose watery bowel movement, if she has 3, will get C. difficile to rule out. We will try Bentyl when necessary at a lower body aches as well as amitriptyline for pain control. Avoid narcotics.  3. Essential  hypertension. Continuing home regimen.  4. Chronic anemia. Likely due to poor by mouth intake Monitor at present.  Nutrition: clear liquid diet DVT Prophylaxis: subcutaneous Heparin  Advance goals of care discussion: full code   Consults: gastroenterology   Family Communication: family was present at bedside, at the time of interview.  Opportunity was given to ask question and all questions were answered  satisfactorily.  Disposition: Admitted as observation, med surge unit. Likely to be discharged home, in 1-2 days   Author: Berle Mull, MD [facility] Pager: [phone] 01/13/2017  If 7PM-7AM, please contact night-coverage [email or web address] Password [email or web address]

## 2017-01-14 DIAGNOSIS — J84112 Idiopathic pulmonary fibrosis: Secondary | ICD-10-CM | POA: Diagnosis not present

## 2017-01-14 DIAGNOSIS — N390 Urinary tract infection, site not specified: Secondary | ICD-10-CM | POA: Diagnosis not present

## 2017-01-14 DIAGNOSIS — N289 Disorder of kidney and ureter, unspecified: Secondary | ICD-10-CM | POA: Diagnosis not present

## 2017-01-14 DIAGNOSIS — N189 Chronic kidney disease, unspecified: Secondary | ICD-10-CM | POA: Diagnosis not present

## 2017-01-14 DIAGNOSIS — N059 Unspecified nephritic syndrome with unspecified morphologic changes: Secondary | ICD-10-CM | POA: Diagnosis not present

## 2017-01-14 DIAGNOSIS — I129 Hypertensive chronic kidney disease with stage 1 through stage 4 chronic kidney disease, or unspecified chronic kidney disease: Secondary | ICD-10-CM | POA: Diagnosis not present

## 2017-01-14 DIAGNOSIS — E669 Obesity, unspecified: Secondary | ICD-10-CM | POA: Diagnosis not present

## 2017-01-14 DIAGNOSIS — R1031 Right lower quadrant pain: Secondary | ICD-10-CM

## 2017-01-14 DIAGNOSIS — R918 Other nonspecific abnormal finding of lung field: Secondary | ICD-10-CM | POA: Diagnosis present

## 2017-01-14 DIAGNOSIS — R197 Diarrhea, unspecified: Secondary | ICD-10-CM | POA: Diagnosis not present

## 2017-01-14 DIAGNOSIS — Z8719 Personal history of other diseases of the digestive system: Secondary | ICD-10-CM

## 2017-01-14 DIAGNOSIS — N179 Acute kidney failure, unspecified: Secondary | ICD-10-CM | POA: Diagnosis not present

## 2017-01-14 DIAGNOSIS — Z8619 Personal history of other infectious and parasitic diseases: Secondary | ICD-10-CM | POA: Diagnosis not present

## 2017-01-14 DIAGNOSIS — K219 Gastro-esophageal reflux disease without esophagitis: Secondary | ICD-10-CM | POA: Diagnosis not present

## 2017-01-14 DIAGNOSIS — R151 Fecal smearing: Secondary | ICD-10-CM | POA: Diagnosis not present

## 2017-01-14 DIAGNOSIS — D649 Anemia, unspecified: Secondary | ICD-10-CM | POA: Diagnosis not present

## 2017-01-14 DIAGNOSIS — E860 Dehydration: Secondary | ICD-10-CM | POA: Diagnosis not present

## 2017-01-14 DIAGNOSIS — E876 Hypokalemia: Secondary | ICD-10-CM | POA: Diagnosis not present

## 2017-01-14 DIAGNOSIS — R142 Eructation: Secondary | ICD-10-CM | POA: Diagnosis present

## 2017-01-14 DIAGNOSIS — R1013 Epigastric pain: Secondary | ICD-10-CM

## 2017-01-14 DIAGNOSIS — Z9889 Other specified postprocedural states: Secondary | ICD-10-CM

## 2017-01-14 DIAGNOSIS — R195 Other fecal abnormalities: Secondary | ICD-10-CM | POA: Diagnosis not present

## 2017-01-14 DIAGNOSIS — G4733 Obstructive sleep apnea (adult) (pediatric): Secondary | ICD-10-CM | POA: Diagnosis present

## 2017-01-14 DIAGNOSIS — I4891 Unspecified atrial fibrillation: Secondary | ICD-10-CM | POA: Diagnosis not present

## 2017-01-14 DIAGNOSIS — N183 Chronic kidney disease, stage 3 (moderate): Secondary | ICD-10-CM | POA: Diagnosis not present

## 2017-01-14 DIAGNOSIS — I480 Paroxysmal atrial fibrillation: Secondary | ICD-10-CM | POA: Diagnosis not present

## 2017-01-14 DIAGNOSIS — K52832 Lymphocytic colitis: Secondary | ICD-10-CM

## 2017-01-14 DIAGNOSIS — I495 Sick sinus syndrome: Secondary | ICD-10-CM | POA: Diagnosis not present

## 2017-01-14 DIAGNOSIS — I4892 Unspecified atrial flutter: Secondary | ICD-10-CM | POA: Diagnosis not present

## 2017-01-14 DIAGNOSIS — G8929 Other chronic pain: Secondary | ICD-10-CM | POA: Diagnosis present

## 2017-01-14 DIAGNOSIS — I340 Nonrheumatic mitral (valve) insufficiency: Secondary | ICD-10-CM | POA: Diagnosis not present

## 2017-01-14 DIAGNOSIS — N184 Chronic kidney disease, stage 4 (severe): Secondary | ICD-10-CM | POA: Diagnosis present

## 2017-01-14 DIAGNOSIS — I252 Old myocardial infarction: Secondary | ICD-10-CM | POA: Diagnosis not present

## 2017-01-14 DIAGNOSIS — R0602 Shortness of breath: Secondary | ICD-10-CM | POA: Diagnosis not present

## 2017-01-14 LAB — CBC WITH DIFFERENTIAL/PLATELET
BASOS PCT: 0 %
Basophils Absolute: 0 10*3/uL (ref 0.0–0.1)
Eosinophils Absolute: 0.1 10*3/uL (ref 0.0–0.7)
Eosinophils Relative: 1 %
HEMATOCRIT: 28.6 % — AB (ref 36.0–46.0)
HEMOGLOBIN: 9.7 g/dL — AB (ref 12.0–15.0)
LYMPHS ABS: 2.3 10*3/uL (ref 0.7–4.0)
LYMPHS PCT: 33 %
MCH: 30.1 pg (ref 26.0–34.0)
MCHC: 33.9 g/dL (ref 30.0–36.0)
MCV: 88.8 fL (ref 78.0–100.0)
MONO ABS: 0.3 10*3/uL (ref 0.1–1.0)
MONOS PCT: 4 %
NEUTROS ABS: 4.4 10*3/uL (ref 1.7–7.7)
NEUTROS PCT: 62 %
Platelets: 285 10*3/uL (ref 150–400)
RBC: 3.22 MIL/uL — ABNORMAL LOW (ref 3.87–5.11)
RDW: 12.8 % (ref 11.5–15.5)
WBC: 7 10*3/uL (ref 4.0–10.5)

## 2017-01-14 LAB — URINE CULTURE

## 2017-01-14 LAB — COMPREHENSIVE METABOLIC PANEL
ALK PHOS: 63 U/L (ref 38–126)
ALT: 10 U/L — ABNORMAL LOW (ref 14–54)
ANION GAP: 11 (ref 5–15)
AST: 16 U/L (ref 15–41)
Albumin: 3.4 g/dL — ABNORMAL LOW (ref 3.5–5.0)
BUN: 54 mg/dL — ABNORMAL HIGH (ref 6–20)
CALCIUM: 8.6 mg/dL — AB (ref 8.9–10.3)
CHLORIDE: 111 mmol/L (ref 101–111)
CO2: 19 mmol/L — AB (ref 22–32)
Creatinine, Ser: 4.91 mg/dL — ABNORMAL HIGH (ref 0.44–1.00)
GFR calc non Af Amer: 8 mL/min — ABNORMAL LOW (ref >60)
GFR, EST AFRICAN AMERICAN: 9 mL/min — AB (ref >60)
GLUCOSE: 101 mg/dL — AB (ref 65–99)
Potassium: 3.7 mmol/L (ref 3.5–5.1)
SODIUM: 141 mmol/L (ref 135–145)
Total Bilirubin: 0.4 mg/dL (ref 0.3–1.2)
Total Protein: 6.7 g/dL (ref 6.5–8.1)

## 2017-01-14 LAB — LACTIC ACID, PLASMA: LACTIC ACID, VENOUS: 0.8 mmol/L (ref 0.5–1.9)

## 2017-01-14 LAB — LIPASE, BLOOD: Lipase: 87 U/L — ABNORMAL HIGH (ref 11–51)

## 2017-01-14 LAB — MAGNESIUM: Magnesium: 1.7 mg/dL (ref 1.7–2.4)

## 2017-01-14 MED ORDER — HYDRALAZINE HCL 20 MG/ML IJ SOLN
10.0000 mg | Freq: Four times a day (QID) | INTRAMUSCULAR | Status: DC | PRN
  Filled 2017-01-14: qty 0.5

## 2017-01-14 MED ORDER — SUCRALFATE 1 GM/10ML PO SUSP
1.0000 g | Freq: Three times a day (TID) | ORAL | Status: DC
  Administered 2017-01-14 – 2017-01-21 (×25): 1 g via ORAL
  Filled 2017-01-14 (×26): qty 10

## 2017-01-14 MED ORDER — SODIUM BICARBONATE 650 MG PO TABS
650.0000 mg | ORAL_TABLET | Freq: Every day | ORAL | Status: DC
  Administered 2017-01-14 – 2017-01-19 (×6): 650 mg via ORAL
  Filled 2017-01-14 (×7): qty 1

## 2017-01-14 MED ORDER — ISOSORBIDE MONONITRATE ER 30 MG PO TB24: 30.0000 mg | ORAL_TABLET | Freq: Every day | ORAL | Status: DC

## 2017-01-14 MED ORDER — ISOSORBIDE MONONITRATE ER 60 MG PO TB24
60.0000 mg | ORAL_TABLET | Freq: Every day | ORAL | Status: DC
  Administered 2017-01-14 – 2017-01-21 (×8): 60 mg via ORAL
  Filled 2017-01-14 (×8): qty 1

## 2017-01-14 MED ORDER — FAMOTIDINE IN NACL 20-0.9 MG/50ML-% IV SOLN
20.0000 mg | Freq: Two times a day (BID) | INTRAVENOUS | Status: DC
  Administered 2017-01-14 – 2017-01-19 (×10): 20 mg via INTRAVENOUS
  Filled 2017-01-14 (×11): qty 50

## 2017-01-14 MED ORDER — ISOSORBIDE MONONITRATE ER 60 MG PO TB24: 60.0000 mg | ORAL_TABLET | Freq: Every day | ORAL | Status: DC

## 2017-01-14 NOTE — Consult Note (Signed)
Referring Provider:  [facility] Primary Care Physician:  Burnard Bunting, MD Primary Gastroenterologist:  Dr. Henrene Pastor   Reason for Consultation:  Abdominal pain, weight loss  HPI: Jody Ruiz is a 76 y.o. female with past medical history of coronary artery disease, hypertension, idiopathic pulmonary fibrosis, history of C. difficile, microscopic colitis, intractable abdominal chronic pain, obstructive sleep apnea, obesity.  Patient presented to the ED with complaints of abdominal pain. This pain has been ongoing for last several months. Patient actually has been following up with GI as an outpatient and had an upper GI endoscopy on 01/12/2017.  Apparently there were plans for outpatient CT scan but then patient had a lot of abdominal pain so came to the ED.  CT scan of the abdomen and pelvis without contrast was performed and no acute issues were found. Patient had acute kidney injury on chronic kidney disease (labs much worse) and therefore was referred for admission and GI was consulted.    My office note from 01/02/2017 that somewhat outlines the ongoing issues as follows:  "This is a pleasant 76 year old female who is known to Dr. Henrene Pastor. She gets colonoscopies regularly due to personal history of polyps and family history of colon cancer. She is up-to-date with that. She recently went through a severe bout of diarrhea for several, thought to be due to microscopic colitis, but then was found to have C. difficile. Please see office note from [place] on 12/02/2016 for further details regarding that issue.  Anyway, she is here today with some upper GI complaints. She says that for the past few weeks she's been having a significant amount of belching. She describes a burning all the way from her throat all way down into her abdomen. She reports poor appetite and just feels nauseous/sick on her stomach.  She is not on any acid medication.  She does admit that she is under a lot of stress as  she is the primary caregiver for several members of her family that have been sick recently.  She also mentions some right-sided abdominal pain that seems to come and go."  She says that up until the EGD she was not having diarrhea but then started with some of that again following the procedure.  Continues to complain of inability to eat, belching, burning.  Is on pepcid 20 mg IV daily, simethicone, Florastor, and bentyl.  Also complaining more about the right sided abdominal pain at the area of her previous hernia repair and mesh.  Reports approximately 26 pound weight loss since 05/2016  Past Medical History:  Diagnosis Date  . Arthritis   . Borderline hypothyroidism   . CAD (coronary artery disease)    a. 04/2012 NSTEMI/Cath:  pLAD 90%, mLAD 70-80% (long) (3x16 & 2.5x28 Promus DES to p/m LAD), pD1 70-80%, pRI 30% (small), CFX 20%, OM1 40%, mOM2 30%, pRCA 30%, PDA 30-40%, pPLB 70%, then mid 90%, EF 65%;  b. 03/2013 Abnl CL w/apical isch;  c. 04/2013 Cath: LM nl, LAD patent stents, LCX <20, RCA 30p PDA 40-50 EF 55-60% ->Cont Med Rx.  c. CP s/p LHC with stable dz and patent stents--> Rx medically   . Cataract   . Dyspnea    a. CP and SOB 06/2012 => Ticagrelor changed to Plavix  . History of Clostridium difficile infection   . HLD (hyperlipidemia)    intolerant to statins  . Hx of adenomatous colonic polyps   . Hx of echocardiogram    a. Echo 2/14:  mild LVH, EF 55-65%, Gr 1 diast dysfn, mild LAE  . Hypertension   . Idiopathic pulmonary fibrosis   . Internal hemorrhoids   . Lung nodules   . Microscopic colitis   . Myocardial infarction ([place]) 2013  . Obesity   . OSA (obstructive sleep apnea)    a. on CPAP  . Sleep apnea    cpap    Past Surgical History:  Procedure Laterality Date  . APPENDECTOMY  1994  . BREAST BIOPSY  1965, 1975   bilateral  . CARDIAC CATHETERIZATION     x4  . CHOLECYSTECTOMY  2004  . CORONARY STENT PLACEMENT  04/2012   x2  . KNEE ARTHROSCOPY  2000    bilateral  . LEFT AND RIGHT HEART CATHETERIZATION WITH CORONARY ANGIOGRAM N/A 08/02/2014   Procedure: LEFT AND RIGHT HEART CATHETERIZATION WITH CORONARY ANGIOGRAM;  Surgeon: Burnell Blanks, MD;  Location: [facility] CATH LAB;  Service: Cardiovascular;  Laterality: N/A;  . LEFT HEART CATHETERIZATION WITH CORONARY ANGIOGRAM N/A 04/29/2012   Procedure: LEFT HEART CATHETERIZATION WITH CORONARY ANGIOGRAM;  Surgeon: Peter M Martinique, MD;  Location: [facility] CATH LAB;  Service: Cardiovascular;  Laterality: N/A;  . LEFT HEART CATHETERIZATION WITH CORONARY ANGIOGRAM N/A 08/03/2012   Procedure: LEFT HEART CATHETERIZATION WITH CORONARY ANGIOGRAM;  Surgeon: Burnell Blanks, MD;  Location: [facility] CATH LAB;  Service: Cardiovascular;  Laterality: N/A;  . LEFT HEART CATHETERIZATION WITH CORONARY ANGIOGRAM N/A 04/12/2013   Procedure: LEFT HEART CATHETERIZATION WITH CORONARY ANGIOGRAM;  Surgeon: Peter M Martinique, MD;  Location: [facility] CATH LAB;  Service: Cardiovascular;  Laterality: N/A;  . PERCUTANEOUS CORONARY STENT INTERVENTION (PCI-S) N/A 04/29/2012   Procedure: PERCUTANEOUS CORONARY STENT INTERVENTION (PCI-S);  Surgeon: Peter M Martinique, MD;  Location: [facility] CATH LAB;  Service: Cardiovascular;  Laterality: N/A;  . SHOULDER ARTHROSCOPY  2012   left  . vaginal polypectomy  2004  . VENTRAL HERNIA REPAIR  2005    Prior to Admission medications   Medication Sig Start Date End Date Taking? Authorizing Provider  amLODipine (NORVASC) 5 MG tablet Take 5 mg by mouth daily.   Yes [provider]  aspirin EC 81 MG tablet Take 81 mg by mouth daily.   Yes [provider]  isosorbide mononitrate (IMDUR) 60 MG 24 hr tablet Take 60 mg by mouth at bedtime.   Yes [provider]  metoprolol succinate (TOPROL-XL) 25 MG 24 hr tablet Take 1 tablet (25 mg total) by mouth daily. 11/08/16  Yes Cheryln Manly, NP  nitroGLYCERIN (NITROSTAT) 0.4 MG SL tablet Place 1 tablet (0.4 mg total) under the tongue every 5 (five)  minutes x 3 doses as needed for chest pain. 08/03/14  Yes Eileen Stanford, PA-C  omeprazole (PRILOSEC) 20 MG capsule Take 2 capsules (40 mg total) by mouth daily. 01/12/17  Yes Irene Shipper, MD  ondansetron (ZOFRAN) 4 MG tablet Take 1 tablet (4 mg total) by mouth every 8 (eight) hours as needed for nausea or vomiting. 01/08/17  Yes Irene Shipper, MD  ranitidine (ZANTAC) 150 MG tablet Take 1 tablet (150 mg total) by mouth 2 (two) times daily. Patient not taking: Reported on 01/13/2017 01/02/17   Loralie Champagne, PA-C    Current Facility-Administered Medications  Medication Dose Route Frequency Provider Last Rate Last Dose  . 0.9 %  sodium chloride infusion   Intravenous Continuous Lavina Hamman, MD 125 mL/hr at 01/13/17 2141    . acetaminophen (TYLENOL) tablet 650 mg  650 mg Oral Q6H  PRN Lavina Hamman, MD       Or  . acetaminophen (TYLENOL) suppository 650 mg  650 mg Rectal Q6H PRN Lavina Hamman, MD      . amitriptyline (ELAVIL) tablet 25 mg  25 mg Oral QHS Lavina Hamman, MD      . aspirin EC tablet 81 mg  81 mg Oral Daily Lavina Hamman, MD      . dicyclomine (BENTYL) capsule 10 mg  10 mg Oral TID PRN Lavina Hamman, MD   10 mg at 01/13/17 2141  . famotidine (PEPCID) IVPB 20 mg premix  20 mg Intravenous Q24H Lavina Hamman, MD   Stopped at 01/13/17 2210  . heparin injection 5,000 Units  5,000 Units Subcutaneous Q8H Lavina Hamman, MD      . isosorbide mononitrate (IMDUR) 24 hr tablet 30 mg  30 mg Oral QHS Florencia Reasons, MD      . metoprolol succinate (TOPROL-XL) 24 hr tablet 25 mg  25 mg Oral Daily Lavina Hamman, MD      . ondansetron [facility]) tablet 4 mg  4 mg Oral Q6H PRN Lavina Hamman, MD       Or  . ondansetron [facility]) injection 4 mg  4 mg Intravenous Q6H PRN Lavina Hamman, MD   4 mg at 01/14/17 0139  . saccharomyces boulardii (FLORASTOR) capsule 250 mg  250 mg Oral BID Lavina Hamman, MD   250 mg at 01/13/17 2141  . simethicone (MYLICON) 40 OB/0.9GG suspension 40 mg  40 mg  Oral QID Lavina Hamman, MD        Allergies as of 01/13/2017 - Review Complete 01/13/2017  Allergen Reaction Noted  . Brilinta [ticagrelor] Shortness Of Breath, Other (See Comments), and Cough 08/02/2012  . Lisinopril Shortness Of Breath and Cough 08/02/2012  . Statins Other (See Comments) 11/13/2011  . Zetia [ezetimibe] Other (See Comments) 07/17/2013  . Macrodantin [nitrofurantoin macrocrystal] Rash 11/13/2011  . Sulfa antibiotics Rash 11/13/2011    Family History  Problem Relation Age of Onset  . Breast cancer Sister   . Prostate cancer Father   . Colon polyps Father   . Heart disease Father 47       CAD, died of ESRD  . Kidney disease Father   . Colon cancer Sister 28  . Heart disease Mother 28       Died acute MI  . Heart disease Brother 52       CABG  . Esophageal cancer Neg Hx   . Rectal cancer Neg Hx   . Stomach cancer Neg Hx     Social History   Social History  . Marital status: Married    Spouse name: N/A  . Number of children: 3  . Years of education: N/A   Occupational History  . Retired Therapist, sports Retired   Social History Main Topics  . Smoking status: Never Smoker  . Smokeless tobacco: Never Used     Comment: second hand smoke   . Alcohol use No  . Drug use: No  . Sexual activity: Not on file   Other Topics Concern  . Not on file   Social History Narrative  . No narrative on file    Review of Systems: ROS otherwise negative.  Physical Exam: Vital signs in last 24 hours: Temp:  [97.7 F (36.5 C)-98.9 F (37.2 C)] 98.3 F (36.8 C) (07/11 0512) Pulse Rate:  [65-72] 72 (07/11 0512) Resp:  [14-21] 15 (  07/11 0512) BP: (119-163)/(48-83) 119/48 (07/11 0512) SpO2:  [96 %-100 %] 97 % (07/11 0512) Weight:  [187 lb 9.8 oz (85.1 kg)] 187 lb 9.8 oz (85.1 kg) (07/11 0512) Last BM Date: 01/13/17 General:  Alert, Well-developed, well-nourished, pleasant and cooperative in NAD Head:  Normocephalic and atraumatic. Eyes:  Sclera clear, no icterus.   Conjunctiva pink. Ears:  Normal auditory acuity. Mouth:  No deformity or lesions.   Lungs:  Clear throughout to auscultation.  No wheezes, crackles, or rhonchi.  No increased WOB. Heart:  Regular rate and rhythm; no murmurs, clicks, rubs, or gallops. Abdomen:  Soft, non-distended.  BS present.  Incisional scar noted on right mid-abdomen with tenderness near that area. Rectal:  Deferred  Msk:  Symmetrical without gross deformities. Pulses:  Normal pulses noted. Extremities:  Without clubbing or edema. Neurologic:  Alert and oriented x 4;  grossly normal neurologically. Skin:  Intact without significant lesions or rashes. Psych:  Alert and cooperative. Normal mood and affect.  Intake/Output from previous day: 07/10 0701 - 07/11 0700 In: 1050 [I.V.:1000; IV Piggyback:50] Out: -   Lab Results:  Recent Labs  01/13/17 1020 01/14/17 0755  WBC 8.2 7.0  HGB 10.6* 9.7*  HCT 31.1* 28.6*  PLT 310 285   BMET  Recent Labs  01/13/17 1020 01/14/17 0755  NA 139 141  K 3.5 3.7  CL 109 111  CO2 19* 19*  GLUCOSE 119* 101*  BUN 66* 54*  CREATININE 5.71* 4.91*  CALCIUM 9.0 8.6*   LFT  Recent Labs  01/14/17 0755  PROT 6.7  ALBUMIN 3.4*  AST 16  ALT 10*  ALKPHOS 63  BILITOT 0.4   Studies/Results: Ct Abdomen Pelvis Wo Contrast  Result Date: 01/13/2017 CLINICAL DATA:  Right lower quadrant abdominal pain since January with weight loss EXAM: CT ABDOMEN AND PELVIS WITHOUT CONTRAST TECHNIQUE: Multidetector CT imaging of the abdomen and pelvis was performed following the standard protocol without IV contrast. COMPARISON:  11/18/2003 FINDINGS: Lower chest: There is subpleural honeycombing in the posterior costophrenic sulci. Mediastinal right lower lobe, also seen on chest CT from 2017. Coronary atherosclerosis Hepatobiliary: No focal liver abnormality.Cholecystectomy. Pancreas: Unremarkable. Spleen: Unremarkable. Adrenals/Urinary Tract: Negative adrenals. No hydronephrosis or ureteral  stone. Tiny lower pole calculus on the left. Interpolar parenchymal bar. Presumed hemorrhagic cyst in the lower pole left kidney measuring 8 mm. Unremarkable bladder. Stomach/Bowel: No bowel obstruction or inflammation. No notable diverticula. Appendectomy per history. Vascular/Lymphatic: No acute vascular abnormality. Atherosclerosis of the aorta and iliacs. No acute or focal finding. No mass or adenopathy. Reproductive:No pathologic findings. Other: No ascites or pneumoperitoneum. Umbilical hernia repaired with mesh that is lax and bulging. Musculoskeletal: No acute abnormalities. Simple appearing intramuscular lipoma in the lateral left gluteal musculature measuring up to 7 x 5 cm. Advanced lumbar disc degeneration with levoscoliosis. IMPRESSION: 1. No specific explanation for abdominal pain. 2. Umbilical hernia repair with bulging of the mesh. 3. Subpleural fibrosis at the bases, reference chest CT 06/02/2016. 4. Advanced lumbar disc degeneration with levoscoliosis. 5. 7 cm intramuscular left gluteal lipoma. 6. Tiny left renal calculus. Electronically Signed   By: Monte Fantasia M.D.   On: 01/13/2017 13:45   Dg Chest 2 View  Result Date: 01/13/2017 CLINICAL DATA:  Chronic abdominal pain, belching and nausea for months. Endoscopy yesterday. EXAM: CHEST  2 VIEW COMPARISON:  Chest x-rays dated 11/05/2016 05/23/2015. FINDINGS: Heart size is upper normal, stable. Overall cardiomediastinal silhouette is stable. Atherosclerotic changes noted at the aortic arch. Evaluation of the  lungs somewhat limited by patient motion artifact. Lungs appear grossly clear. No pleural effusion or pneumothorax seen. Mild degenerative spurring throughout the thoracic spine. No acute or suspicious osseous finding. Stable chronic elevation of the right hemidiaphragm. IMPRESSION: 1. No active cardiopulmonary disease. No evidence of pneumonia or pulmonary edema. 2. Aortic atherosclerosis. Electronically Signed   By: Franki Cabot M.D.    On: 01/13/2017 12:08   IMPRESSION/PLAN:  -Belching, poor appetite, 26 pound weight loss since 05/2016, burning/dyspepsia:  Will treat this with pepcid 20 mg IV BID and carafate suspension ACHS short term to see if this helps.  Will try to avoid PPI as this can trigger microscopic colitis and should probably be cautioned with recent history of Cdiff.  ? Consider trial of Xifaxan.  -History of microscopic colitis and recent Cdiff:  More recent Cdiff study negative.  Was not having diarrhea up until her EGD on 7/9. -AOCK:  Much worse recently.  Continue with IV hydration, etc but would consider renal consult if not improving tomorrow. -Right sided abdominal pain with drainage from her umbilicus:  This may be related to her previous hernia surgery/mesh.  Should follow-up with Dr. Hassell Done, surgery, as an outpatient in regards to this.  It is chronic and not an acute inpatient issue, but she certainly may have chronic inflammation there causing these symptoms.  **Could also consider mesenteric ischemia as a cause of these issues.  If symptoms continue then could do a CTA but Cr would have to be below 2 so not an option for now.   Hortencia Martire D.  01/14/2017, 9:54 AM  Pager number [phone]

## 2017-01-14 NOTE — Care Management Obs Status (Signed)
[place] NOTIFICATION   Patient Details  Name: Jody Ruiz MRN: [id number] Date of Birth: 25-Nov-1940   Medicare Observation Status Notification Given:  Yes    Dessa Phi, RN 01/14/2017, 4:28 PM

## 2017-01-14 NOTE — Progress Notes (Signed)
PROGRESS NOTE  Jody Ruiz JOA:[id number] DOB: [birth year] DOA: 01/13/2017 PCP: Burnard Bunting, MD  HPI/Recap of past 24 hours:  Report feeling better, some belching but no vomiting, ab pain has improved, some loose stool, no fever  Assessment/Plan: Principal Problem:   Acute kidney injury superimposed on chronic kidney disease ([place]) Active Problems:   Hypertension   Obesity   IPF (idiopathic pulmonary fibrosis) ([name])   HLD (hyperlipidemia)   Microscopic colitis   Diarrhea with dehydration   Belching   Gastroesophageal reflux disease   H/O umbilical hernia repair    Acute kidney injury superimposed on chronic kidney disease ([name]) -Presents with acute kidney injury. Renal function has worsened at least since March 2018. -Baseline creatinine 1.5. Cr  5.7 on admission. BUN also significantly elevated. -CT abdomen does not show any evidence of urinary obstruction. ua rbc TNTC, 6-30 wbc, she denies urinary symptom, she is reluctant to have abc treatment due to recent h/o cdiff -will continue hydration treat as  prerenal etiology with recent poor by mouth intake. -Avoid nephrotoxic medication.renal dosing meds, start sodium bicarb for metabolic acidosis -admitting MD has discussed with nephrology, recommend formal consult if cr does not improve with hydration, repeat bmp in am  Chronic abdominal pain. History of microscopic colitis. -She  Report chronic lower abdominal pain, Diarrhea with dehydration. And significant weight loss -she was recently treated for cdiff, repeat cdiff testing on 7/5 was negative -she had an unremarkable ekg on 7/9 -gi consulted, recommend " consider MR-A to eval mesenteric vessels and exclude intestinal angina/vessel disease" , will need cr improvement for the studay - gi also recommend " Could consider flex sig for bx to exclude lymphocytic colitis   -will avoid ppi for now due to concerns of microscopic colitis and h/o cdiff,  -she has been on  asa for cad, asa is also associated with colitis, will need to discuss with patient , GI and cardiology about asa use -We will try Bentyl when necessary at a lower body aches ,Avoid narcotics.   Essential hypertension. Will titrate home bp med to avoid hypotension  In the setting of aki  Chronic anemia. Likely due to poor by mouth intake Monitor at present.  H/o CAD s/p stent: on asa, toprolxal, imdur  Nutrition: clear liquid diet DVT Prophylaxis: subcutaneous Heparin  Advance goals of care discussion: full code   Consults:  Gastroenterology Admitting MD has a Phone conversation with nephrology on 7/10    Family Communication: patient  Disposition: not ready for discharge yet   Procedures:  none  Antibiotics:  none   Objective: BP (!) 119/48 (BP Location: Right Arm)   Pulse 72   Temp 98.3 F (36.8 C) (Oral)   Resp 15   Wt 85.1 kg (187 lb 9.8 oz)   LMP  (LMP Unknown)   SpO2 97%   BMI 29.38 kg/m   Intake/Output Summary (Last 24 hours) at 01/14/17 0820 Last data filed at 01/14/17 0500  Gross per 24 hour  Intake             1050 ml  Output                0 ml  Net             1050 ml   Filed Weights   01/14/17 0512  Weight: 85.1 kg (187 lb 9.8 oz)    Exam:   General:  NAD  Cardiovascular: RRR  Respiratory: CTABL  Abdomen: Soft, ND, mild  tenderdness right mid abdomen, no guarding, no rebound, positive BS  Musculoskeletal: No Edema  Neuro: aaox3  Data Reviewed: Basic Metabolic Panel:  Recent Labs Lab 01/13/17 1020  NA 139  K 3.5  CL 109  CO2 19*  GLUCOSE 119*  BUN 66*  CREATININE 5.71*  CALCIUM 9.0   Liver Function Tests:  Recent Labs Lab 01/13/17 1020  AST 16  ALT 11*  ALKPHOS 69  BILITOT 0.6  PROT 7.6  ALBUMIN 3.8    Recent Labs Lab 01/13/17 1020  LIPASE 77*   No results for input(s): AMMONIA in the last 168 hours. CBC:  Recent Labs Lab 01/13/17 1020  WBC 8.2  HGB 10.6*  HCT 31.1*  MCV 87.1  PLT 310    Cardiac Enzymes:    Recent Labs Lab 01/13/17 1959  CKTOTAL 51   BNP (last 3 results)  Recent Labs  11/05/16 0755  BNP 65.3    ProBNP (last 3 results) No results for input(s): PROBNP in the last 8760 hours.  CBG: No results for input(s): GLUCAP in the last 168 hours.  Recent Results (from the past 240 hour(s))  Clostridium Difficile by PCR     Status: None   Collection Time: 01/08/17 12:09 PM  Result Value Ref Range Status   Toxigenic C Difficile by pcr Not Detected Not Detected Final    Comment: This test is for use only with liquid or soft stools; performance characteristics of other clinical specimen types have not been established.   This assay was performed by Cepheid GeneXpert(R) PCR. The performance characteristics of this assay have been determined by [employer]. Performance characteristics refer to the analytical performance of the test.      Studies: Ct Abdomen Pelvis Wo Contrast  Result Date: 01/13/2017 CLINICAL DATA:  Right lower quadrant abdominal pain since January with weight loss EXAM: CT ABDOMEN AND PELVIS WITHOUT CONTRAST TECHNIQUE: Multidetector CT imaging of the abdomen and pelvis was performed following the standard protocol without IV contrast. COMPARISON:  11/18/2003 FINDINGS: Lower chest: There is subpleural honeycombing in the posterior costophrenic sulci. Mediastinal right lower lobe, also seen on chest CT from 2017. Coronary atherosclerosis Hepatobiliary: No focal liver abnormality.Cholecystectomy. Pancreas: Unremarkable. Spleen: Unremarkable. Adrenals/Urinary Tract: Negative adrenals. No hydronephrosis or ureteral stone. Tiny lower pole calculus on the left. Interpolar parenchymal bar. Presumed hemorrhagic cyst in the lower pole left kidney measuring 8 mm. Unremarkable bladder. Stomach/Bowel: No bowel obstruction or inflammation. No notable diverticula. Appendectomy per history. Vascular/Lymphatic: No acute vascular abnormality.  Atherosclerosis of the aorta and iliacs. No acute or focal finding. No mass or adenopathy. Reproductive:No pathologic findings. Other: No ascites or pneumoperitoneum. Umbilical hernia repaired with mesh that is lax and bulging. Musculoskeletal: No acute abnormalities. Simple appearing intramuscular lipoma in the lateral left gluteal musculature measuring up to 7 x 5 cm. Advanced lumbar disc degeneration with levoscoliosis. IMPRESSION: 1. No specific explanation for abdominal pain. 2. Umbilical hernia repair with bulging of the mesh. 3. Subpleural fibrosis at the bases, reference chest CT 06/02/2016. 4. Advanced lumbar disc degeneration with levoscoliosis. 5. 7 cm intramuscular left gluteal lipoma. 6. Tiny left renal calculus. Electronically Signed   By: Monte Fantasia M.D.   On: 01/13/2017 13:45   Dg Chest 2 View  Result Date: 01/13/2017 CLINICAL DATA:  Chronic abdominal pain, belching and nausea for months. Endoscopy yesterday. EXAM: CHEST  2 VIEW COMPARISON:  Chest x-rays dated 11/05/2016 05/23/2015. FINDINGS: Heart size is upper normal, stable. Overall cardiomediastinal silhouette is stable. Atherosclerotic changes  noted at the aortic arch. Evaluation of the lungs somewhat limited by patient motion artifact. Lungs appear grossly clear. No pleural effusion or pneumothorax seen. Mild degenerative spurring throughout the thoracic spine. No acute or suspicious osseous finding. Stable chronic elevation of the right hemidiaphragm. IMPRESSION: 1. No active cardiopulmonary disease. No evidence of pneumonia or pulmonary edema. 2. Aortic atherosclerosis. Electronically Signed   By: Franki Cabot M.D.   On: 01/13/2017 12:08    Scheduled Meds: . amitriptyline  25 mg Oral QHS  . amLODipine  5 mg Oral Daily  . aspirin EC  81 mg Oral Daily  . heparin  5,000 Units Subcutaneous Q8H  . isosorbide mononitrate  60 mg Oral QHS  . metoprolol succinate  25 mg Oral Daily  . saccharomyces boulardii  250 mg Oral BID  .  simethicone  40 mg Oral QID    Continuous Infusions: . sodium chloride 125 mL/hr at 01/13/17 2141  . famotidine (PEPCID) IV Stopped (01/13/17 2210)     Time spent: 71mins  Kaire Stary MD, PhD  [facility] Pager [phone]. If 7PM-7AM, please contact night-coverage at [email or web address], password [facility] 01/14/2017, 8:20 AM  LOS: 0 days

## 2017-01-15 DIAGNOSIS — R195 Other fecal abnormalities: Secondary | ICD-10-CM

## 2017-01-15 DIAGNOSIS — K219 Gastro-esophageal reflux disease without esophagitis: Secondary | ICD-10-CM

## 2017-01-15 DIAGNOSIS — Z8619 Personal history of other infectious and parasitic diseases: Secondary | ICD-10-CM

## 2017-01-15 LAB — C DIFFICILE QUICK SCREEN W PCR REFLEX
C Diff antigen: NEGATIVE
C Diff interpretation: NOT DETECTED
C Diff toxin: NEGATIVE

## 2017-01-15 LAB — CBC WITH DIFFERENTIAL/PLATELET
BASOS ABS: 0 10*3/uL (ref 0.0–0.1)
BASOS PCT: 1 %
EOS ABS: 0.2 10*3/uL (ref 0.0–0.7)
EOS PCT: 3 %
HCT: 30 % — ABNORMAL LOW (ref 36.0–46.0)
Hemoglobin: 10.1 g/dL — ABNORMAL LOW (ref 12.0–15.0)
Lymphocytes Relative: 23 %
Lymphs Abs: 1.8 10*3/uL (ref 0.7–4.0)
MCH: 30.1 pg (ref 26.0–34.0)
MCHC: 33.7 g/dL (ref 30.0–36.0)
MCV: 89.3 fL (ref 78.0–100.0)
Monocytes Absolute: 0.4 10*3/uL (ref 0.1–1.0)
Monocytes Relative: 5 %
Neutro Abs: 5.6 10*3/uL (ref 1.7–7.7)
Neutrophils Relative %: 68 %
PLATELETS: 301 10*3/uL (ref 150–400)
RBC: 3.36 MIL/uL — AB (ref 3.87–5.11)
RDW: 13 % (ref 11.5–15.5)
WBC: 8.1 10*3/uL (ref 4.0–10.5)

## 2017-01-15 LAB — BASIC METABOLIC PANEL
ANION GAP: 9 (ref 5–15)
BUN: 45 mg/dL — AB (ref 6–20)
CO2: 20 mmol/L — ABNORMAL LOW (ref 22–32)
Calcium: 8.7 mg/dL — ABNORMAL LOW (ref 8.9–10.3)
Chloride: 114 mmol/L — ABNORMAL HIGH (ref 101–111)
Creatinine, Ser: 4.63 mg/dL — ABNORMAL HIGH (ref 0.44–1.00)
GFR, EST AFRICAN AMERICAN: 10 mL/min — AB (ref >60)
GFR, EST NON AFRICAN AMERICAN: 8 mL/min — AB (ref >60)
Glucose, Bld: 118 mg/dL — ABNORMAL HIGH (ref 65–99)
POTASSIUM: 3.5 mmol/L (ref 3.5–5.1)
SODIUM: 143 mmol/L (ref 135–145)

## 2017-01-15 LAB — LACTOFERRIN, FECAL, QUALITATIVE: LACTOFERRIN, FECAL, QUAL: NEGATIVE

## 2017-01-15 LAB — UREA NITROGEN, URINE: UREA NITROGEN UR: 280 mg/dL

## 2017-01-15 MED ORDER — LACTATED RINGERS IV BOLUS (SEPSIS)
1000.0000 mL | Freq: Once | INTRAVENOUS | Status: AC
  Administered 2017-01-15: 1000 mL via INTRAVENOUS

## 2017-01-15 NOTE — Progress Notes (Signed)
PROGRESS NOTE  [name]:[id number] DOB: [birth year] DOA: 01/13/2017 PCP: Burnard Bunting, MD  HPI/Recap of past 24 hours:  Report feeling better, no vomiting, some waves of nausea, ab pain has improved, some loose stool, no fever She is frustrated that her cr has not improved much  Assessment/Plan: Principal Problem:   Acute kidney injury superimposed on chronic kidney disease ([place]) Active Problems:   Hypertension   Obesity   IPF (idiopathic pulmonary fibrosis) ([place])   HLD (hyperlipidemia)   Microscopic colitis   Diarrhea with dehydration   Belching   Gastroesophageal reflux disease   H/O umbilical hernia repair   Dyspepsia   AKI (acute kidney injury) ([place])   Acute kidney injury superimposed on chronic kidney disease (HCC) --Baseline creatinine 1.5. Cr  5.7 on admission. BUN also significantly elevated. -CT abdomen does not show any evidence of urinary obstruction. UA RBC TNTC, 6-30 wbc, she denies urinary symptom, she is reluctant to have abc treatment due to recent h/o cdiff -Avoid nephrotoxic medication.renal dosing meds, start sodium bicarb for metabolic acidosis, avoid hypotension. -cr has not improved as expected after two days of hydration, nephrology consulted.   Chronic abdominal pain. Diarrhea, History of microscopic colitis. -She  Report chronic lower abdominal pain, Diarrhea with dehydration. And significant weight loss -she was recently treated for cdiff, repeat cdiff testing on 7/5 was negative -she had an unremarkable ekg on 7/9 -gi consulted, recommend " consider MR-A to eval mesenteric vessels and exclude intestinal angina/vessel disease" , will need cr improvement for the studay - gi also recommend " Could consider flex sig for bx to exclude lymphocytic colitis   -will avoid ppi for now due to concerns of microscopic colitis and h/o cdiff,  -she has been on asa for cad, asa is also associated with colitis, I have discussed with gi, ok to  continue asa for now. -on prn Bentyl when necessary at a lower body aches ,Avoid narcotics.  Right sided abdominal pain with scan drainage from her umbilicus:  Per GI" This may be related to her previous hernia surgery/mesh. Should follow-up with Dr. Hassell Done, surgery, as an outpatient in regards to this. It is chronic and not an acute inpatient issue, but she certainly may have chronic inflammation there causing these symptoms" Ct ab/pel: "Umbilical hernia repair with bulging of the mesh"   Essential hypertension.  titrate home bp med to avoid hypotension  In the setting of aki  Chronic anemia. normocytic hgb stable at 10 during this admission   H/o CAD s/p stents: on asa, toprolxl, imdur  Body mass index is 29.38 kg/m.    DVT Prophylaxis: subcutaneous Heparin which patient refused at times  Advance goals of care discussion: full code   Consults:  Gastroenterology  nephrology   Family Communication: patient  Disposition: not ready for discharge yet   Procedures:  none  Antibiotics:  none   Objective: BP (!) 141/83 (BP Location: Right Arm)   Pulse 64   Temp 98.4 F (36.9 C) (Oral)   Resp 16   Wt 85.1 kg (187 lb 9.8 oz)   LMP  (LMP Unknown)   SpO2 99%   BMI 29.38 kg/m   Intake/Output Summary (Last 24 hours) at 01/15/17 0751 Last data filed at 01/15/17 0600  Gross per 24 hour  Intake             3295 ml  Output             1500 ml  Net  1795 ml   Filed Weights   01/14/17 0512  Weight: 85.1 kg (187 lb 9.8 oz)    Exam:   General:  NAD  Cardiovascular: RRR  Respiratory: CTABL  Abdomen: Soft, ND, mild tenderdness right mid abdomen, no guarding, no rebound, positive BS  Musculoskeletal: No Edema  Neuro: aaox3  Data Reviewed: Basic Metabolic Panel:  Recent Labs Lab 01/13/17 1020 01/14/17 0755 01/15/17 0533  NA 139 141 143  K 3.5 3.7 3.5  CL 109 111 114*  CO2 19* 19* 20*  GLUCOSE 119* 101* 118*  BUN 66* 54* 45*    CREATININE 5.71* 4.91* 4.63*  CALCIUM 9.0 8.6* 8.7*  MG  --  1.7  --    Liver Function Tests:  Recent Labs Lab 01/13/17 1020 01/14/17 0755  AST 16 16  ALT 11* 10*  ALKPHOS 69 63  BILITOT 0.6 0.4  PROT 7.6 6.7  ALBUMIN 3.8 3.4*    Recent Labs Lab 01/13/17 1020 01/14/17 0755  LIPASE 77* 87*   No results for input(s): AMMONIA in the last 168 hours. CBC:  Recent Labs Lab 01/13/17 1020 01/14/17 0755 01/15/17 0533  WBC 8.2 7.0 8.1  NEUTROABS  --  4.4 5.6  HGB 10.6* 9.7* 10.1*  HCT 31.1* 28.6* 30.0*  MCV 87.1 88.8 89.3  PLT 310 285 301   Cardiac Enzymes:    Recent Labs Lab 01/13/17 1959  CKTOTAL 51   BNP (last 3 results)  Recent Labs  11/05/16 0755  BNP 65.3    ProBNP (last 3 results) No results for input(s): PROBNP in the last 8760 hours.  CBG: No results for input(s): GLUCAP in the last 168 hours.  Recent Results (from the past 240 hour(s))  Clostridium Difficile by PCR     Status: None   Collection Time: 01/08/17 12:09 PM  Result Value Ref Range Status   Toxigenic C Difficile by pcr Not Detected Not Detected Final    Comment: This test is for use only with liquid or soft stools; performance characteristics of other clinical specimen types have not been established.   This assay was performed by Cepheid GeneXpert(R) PCR. The performance characteristics of this assay have been determined by [employer]. Performance characteristics refer to the analytical performance of the test.   Urine culture     Status: Abnormal   Collection Time: 01/13/17 11:09 AM  Result Value Ref Range Status   Specimen Description URINE, CLEAN CATCH  Final   Special Requests NONE  Final   Culture (A)  Final    <10,000 COLONIES/mL INSIGNIFICANT GROWTH Performed at [facility], [address], [place], [place] [address]    Report Status 01/14/2017 FINAL  Final     Studies: No results found.  Scheduled Meds: . aspirin EC  81 mg Oral Daily  .  heparin  5,000 Units Subcutaneous Q8H  . isosorbide mononitrate  60 mg Oral QHS  . metoprolol succinate  25 mg Oral Daily  . saccharomyces boulardii  250 mg Oral BID  . simethicone  40 mg Oral QID  . sodium bicarbonate  650 mg Oral Daily  . sucralfate  1 g Oral TID AC & HS    Continuous Infusions: . sodium chloride 125 mL/hr at 01/14/17 1832  . famotidine (PEPCID) IV 20 mg (01/14/17 2207)  . lactated ringers       Time spent: 55mins  Mariaelena Cade MD, PhD  [facility] Pager [phone]. If 7PM-7AM, please contact night-coverage at [email or web address], password [facility]  01/15/2017, 7:51 AM  LOS: 1 day

## 2017-01-15 NOTE — Consult Note (Signed)
Renal Service Consult Note [facility]  Jody Ruiz 01/15/2017 [place] Requesting Physician:  Dr Erlinda Hong   Reason for Consult:  Acute renal failure HPI: The patient is a 76 y.o. 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  Creat is 5.71 on admissoin and down to 4.6 today with IVF's x 2 days.  Not down like expected, we are asked to see for acute/ chron renal failure.   Pt denies any hx of kidney fialure.  Has had "murky brown" urine noted over the last few weeks.  No fevers, no flank pain, no abd pain.  No OTC meds and no nsaids.    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  Lives w/ husband who is in poor health.  Has 3 grown children A&W.  Lives in [place].   +hx MI/ stents and HTN, no DM, no cancer/ DVT/ CVA.  Hx "microscopic colitis".  F/B dr Elsworth Soho for pulm fibrosis.  Was referred to [facility] and they "did a bunch of blood work" and sent her back to Dr Elsworth Soho.  No O2, no chronic DOE.    Chart Mar 08 - lap chole Oct 13 - NSTEMI, HTN. HL > cath w/ 2V disease underwent stenting of prox and mid LAD (stentx 2) Jan 14 - unstable angina, hx CAD, HTN/ obesity > cath showed patent stents, no new dz Jan 15 - chest pain, ruled out for MI.  Imdur increased.  Jan 16 - CP, DOE, fatigue, hx IPF. prob viral URI.  Dc' home Apr 18 - diarrhea w vol depletion, HTN, known CAD sp stents, microscop colitis/ enteritis due to [facility] May 18 - chest pain. Ruled out for MI, had Lexiscan negative for ischemia.   UA 11/2016 > no bact, TNTC rbc/ wbc, 100 prot UA 01/13/17 > rare bact, 100 prot, tntc RBC/ 6-30 wbc  UNa 65, UCr 75  Na 143  K 3.5  CO2  20  AG 9  BUN 45  Cr 4.63  Ca 8.7  Alb 3.4  LFT's ok  eGFR 9 WBC 8k   Hb 10  plt 301   Date  Creat   eGFR 2010- 16 0.6- 0.9 2017  0.91 Mar 18  1.52 Apr 18  1.6- 1.9 May 18  1.6- 1.7  29- 30 Jul 10  5.71 Jul 11  4.91 Jul 12  4.63  ROS  denies CP  no joint pain   no HA  no  blurry vision  no rash  no diarrhea  no nausea/ vomiting  no dysuria  no difficulty voiding  no change in urine color    Past Medical History  Past Medical History:  Diagnosis Date  . Arthritis   . Borderline hypothyroidism   . CAD (coronary artery disease)    a. 04/2012 NSTEMI/Cath:  pLAD 90%, mLAD 70-80% (long) (3x16 & 2.5x28 Promus DES to p/m LAD), pD1 70-80%, pRI 30% (small), CFX 20%, OM1 40%, mOM2 30%, pRCA 30%, PDA 30-40%, pPLB 70%, then mid 90%, EF 65%;  b. 03/2013 Abnl CL w/apical isch;  c. 04/2013 Cath: LM nl, LAD patent stents, LCX <20, RCA 30p PDA 40-50 EF 55-60% ->Cont Med Rx.  c. CP s/p LHC with stable dz and patent stents--> Rx medically   . Cataract   . Dyspnea    a. CP and SOB 06/2012 => Ticagrelor changed to Plavix  . History of Clostridium difficile infection   .  HLD (hyperlipidemia)    intolerant to statins  . Hx of adenomatous colonic polyps   . Hx of echocardiogram    a. Echo 2/14:  mild LVH, EF 55-65%, Gr 1 diast dysfn, mild LAE  . Hypertension   . Idiopathic pulmonary fibrosis   . Internal hemorrhoids   . Lung nodules   . Microscopic colitis   . Myocardial infarction ([place]) 2013  . Obesity   . OSA (obstructive sleep apnea)    a. on CPAP  . Sleep apnea    cpap   Past Surgical History  Past Surgical History:  Procedure Laterality Date  . APPENDECTOMY  1994  . BREAST BIOPSY  1965, 1975   bilateral  . CARDIAC CATHETERIZATION     x4  . CHOLECYSTECTOMY  2004  . CORONARY STENT PLACEMENT  04/2012   x2  . KNEE ARTHROSCOPY  2000   bilateral  . LEFT AND RIGHT HEART CATHETERIZATION WITH CORONARY ANGIOGRAM N/A 08/02/2014   Procedure: LEFT AND RIGHT HEART CATHETERIZATION WITH CORONARY ANGIOGRAM;  Surgeon: Burnell Blanks, MD;  Location: [facility] CATH LAB;  Service: Cardiovascular;  Laterality: N/A;  . LEFT HEART CATHETERIZATION WITH CORONARY ANGIOGRAM N/A 04/29/2012   Procedure: LEFT HEART CATHETERIZATION WITH CORONARY ANGIOGRAM;  Surgeon: Peter M Martinique,  MD;  Location: [facility] CATH LAB;  Service: Cardiovascular;  Laterality: N/A;  . LEFT HEART CATHETERIZATION WITH CORONARY ANGIOGRAM N/A 08/03/2012   Procedure: LEFT HEART CATHETERIZATION WITH CORONARY ANGIOGRAM;  Surgeon: Burnell Blanks, MD;  Location: [facility] CATH LAB;  Service: Cardiovascular;  Laterality: N/A;  . LEFT HEART CATHETERIZATION WITH CORONARY ANGIOGRAM N/A 04/12/2013   Procedure: LEFT HEART CATHETERIZATION WITH CORONARY ANGIOGRAM;  Surgeon: Peter M Martinique, MD;  Location: [facility] CATH LAB;  Service: Cardiovascular;  Laterality: N/A;  . PERCUTANEOUS CORONARY STENT INTERVENTION (PCI-S) N/A 04/29/2012   Procedure: PERCUTANEOUS CORONARY STENT INTERVENTION (PCI-S);  Surgeon: Peter M Martinique, MD;  Location: [facility] CATH LAB;  Service: Cardiovascular;  Laterality: N/A;  . SHOULDER ARTHROSCOPY  2012   left  . vaginal polypectomy  2004  . VENTRAL HERNIA REPAIR  2005   Family History  Family History  Problem Relation Age of Onset  . Breast cancer Sister   . Prostate cancer Father   . Colon polyps Father   . Heart disease Father 2       CAD, died of ESRD  . Kidney disease Father   . Colon cancer Sister 15  . Heart disease Mother 32       Died acute MI  . Heart disease Brother 31       CABG  . Esophageal cancer Neg Hx   . Rectal cancer Neg Hx   . Stomach cancer Neg Hx    Social History  reports that she has never smoked. She has never used smokeless tobacco. She reports that she does not drink alcohol or use drugs. Allergies  Allergies  Allergen Reactions  . Brilinta [Ticagrelor] Shortness Of Breath, Other (See Comments) and Cough    Reaction:  Fatigue  . Lisinopril Shortness Of Breath and Cough  . Statins Other (See Comments)    Reaction:  Muscle pain/weakness  . Zetia [Ezetimibe] Other (See Comments)    Reaction:  Muscle pain/weakness   . Macrodantin [Nitrofurantoin Macrocrystal] Rash  . Sulfa Antibiotics Rash   Home medications Prior to Admission medications   Medication Sig Start  Date End Date Taking? Authorizing Provider  amLODipine (NORVASC) 5 MG tablet Take 5 mg by mouth daily.  Yes [provider]  aspirin EC 81 MG tablet Take 81 mg by mouth daily.   Yes [provider]  isosorbide mononitrate (IMDUR) 60 MG 24 hr tablet Take 60 mg by mouth at bedtime.   Yes [provider]  metoprolol succinate (TOPROL-XL) 25 MG 24 hr tablet Take 1 tablet (25 mg total) by mouth daily. 11/08/16  Yes Cheryln Manly, NP  nitroGLYCERIN (NITROSTAT) 0.4 MG SL tablet Place 1 tablet (0.4 mg total) under the tongue every 5 (five) minutes x 3 doses as needed for chest pain. 08/03/14  Yes Eileen Stanford, PA-C  omeprazole (PRILOSEC) 20 MG capsule Take 2 capsules (40 mg total) by mouth daily. 01/12/17  Yes Irene Shipper, MD  ondansetron (ZOFRAN) 4 MG tablet Take 1 tablet (4 mg total) by mouth every 8 (eight) hours as needed for nausea or vomiting. 01/08/17  Yes Irene Shipper, MD  ranitidine (ZANTAC) 150 MG tablet Take 1 tablet (150 mg total) by mouth 2 (two) times daily. Patient not taking: Reported on 01/13/2017 01/02/17   Loralie Champagne, PA-C   Liver Function Tests  Recent Labs Lab 01/13/17 1020 01/14/17 0755  AST 16 16  ALT 11* 10*  ALKPHOS 69 63  BILITOT 0.6 0.4  PROT 7.6 6.7  ALBUMIN 3.8 3.4*    Recent Labs Lab 01/13/17 1020 01/14/17 0755  LIPASE 77* 87*   CBC  Recent Labs Lab 01/13/17 1020 01/14/17 0755 01/15/17 0533  WBC 8.2 7.0 8.1  NEUTROABS  --  4.4 5.6  HGB 10.6* 9.7* 10.1*  HCT 31.1* 28.6* 30.0*  MCV 87.1 88.8 89.3  PLT 310 285 466   Basic Metabolic Panel  Recent Labs Lab 01/13/17 1020 01/14/17 0755 01/15/17 0533  NA 139 141 143  K 3.5 3.7 3.5  CL 109 111 114*  CO2 19* 19* 20*  GLUCOSE 119* 101* 118*  BUN 66* 54* 45*  CREATININE 5.71* 4.91* 4.63*  CALCIUM 9.0 8.6* 8.7*   Iron/TIBC/Ferritin/ %Sat No results found for: IRON, TIBC, FERRITIN, IRONPCTSAT  Vitals:   01/14/17 1444 01/14/17 2119 01/15/17 0516 01/15/17  1004  BP: (!) 160/74 (!) 161/79 (!) 141/83 (!) 150/76  Pulse: 62 65 64 69  Resp: _0 Temp: 98.2 F (36.8 C) 98.4 F (36.9 C) 98.4 F (36.9 C) 98.7 F (37.1 C)  TempSrc: Oral Oral Oral Oral  SpO2: 100% 98% 99% 97%  Weight:       Exam Gen older adult, WDWN, obese, no distress, calm and pleasant No rash, cyanosis or gangrene Sclera anicteric, throat clear  No jvd or bruits Chest clear bilat RRR no MRG Abd soft ntnd no mass or ascites +bs no bruits GU defer MS no joint effusions or deformity Ext no LE or UE edema / no wounds or ulcers Neuro is alert, Ox 3 , nf   Home meds > norvasc, asa, Imdur, Toprol, NTG,  Prilosec, zofran,zantac  IP meds > asa, imdur, toprol, mylicon, NaHCO3, carafate, IVF, pepcid IV, bentyl, hydralzine IV, po contrast 30 cc, zofran,   Urine sediment (by undersigned) > 50-100 rbc's /hpf, +dysmorphic RBC's, +gran casts , some degen cellular casts , WBC 10-25 per hpf  Assess: 1   Acute on CRF - creat 0.9 last year, then up this March to 1.5, and now is in 4-5 range.  Urine sediment suspicious for GN (+dysmorphic RBC's , gran casts).  Will order complement and serologies, will likely need kidney biopsy.  Get renal US.  Have d/w patient.  Cont IVF for now, no vol excess 2  HTN 3   CAD hx stents x 2 4  Pulm fibrosis - mild, not on O2    Plan - as above  Kelly Splinter MD [employer] pager [phone]   01/15/2017, 1:42 PM

## 2017-01-15 NOTE — Progress Notes (Signed)
[facility] Progress Note  Subjective:  Says that the abdominal pain, burning, belching are all better.  Still having waves of nausea.  Upset because her kidneys are not getting better.  Still having a lot of watery diarrhea, multiple times, some with incontinence.  Would like to try to eat some solid food.  Objective:  Vital signs in last 24 hours: Temp:  [98.2 F (36.8 C)-98.4 F (36.9 C)] 98.4 F (36.9 C) (07/12 0516) Pulse Rate:  [62-65] 64 (07/12 0516) Resp:  [16-18] 16 (07/12 0516) BP: (141-161)/(74-83) 141/83 (07/12 0516) SpO2:  [98 %-100 %] 99 % (07/12 0516) Last BM Date: 01/15/17 General: Alert, Well-developed, in NAD Heart:  Regular rate and rhythm; no murmurs Pulm:  CTAB.  No increased WOB. Abdomen:  Soft, non-distended.  BS present.  Minimal right sided TTP.  Extremities:  Without edema. Neurologic:  Alert and oriented x 4;  grossly normal neurologically. Psych:  Alert and cooperative. Normal mood and affect.  Intake/Output from previous day: 07/11 0701 - 07/12 0700 In: 3295 [P.O.:120; I.V.:3125; IV Piggyback:50] Out: 1500 [Urine:1500]  Lab Results:  Recent Labs  01/13/17 1020 01/14/17 0755 01/15/17 0533  WBC 8.2 7.0 8.1  HGB 10.6* 9.7* 10.1*  HCT 31.1* 28.6* 30.0*  PLT 310 285 301   BMET  Recent Labs  01/13/17 1020 01/14/17 0755 01/15/17 0533  NA 139 141 143  K 3.5 3.7 3.5  CL 109 111 114*  CO2 19* 19* 20*  GLUCOSE 119* 101* 118*  BUN 66* 54* 45*  CREATININE 5.71* 4.91* 4.63*  CALCIUM 9.0 8.6* 8.7*   LFT  Recent Labs  01/14/17 0755  PROT 6.7  ALBUMIN 3.4*  AST 16  ALT 10*  ALKPHOS 63  BILITOT 0.4   Ct Abdomen Pelvis Wo Contrast  Result Date: 01/13/2017 CLINICAL DATA:  Right lower quadrant abdominal pain since January with weight loss EXAM: CT ABDOMEN AND PELVIS WITHOUT CONTRAST TECHNIQUE: Multidetector CT imaging of the abdomen and pelvis was performed following the standard protocol without IV contrast.  COMPARISON:  11/18/2003 FINDINGS: Lower chest: There is subpleural honeycombing in the posterior costophrenic sulci. Mediastinal right lower lobe, also seen on chest CT from 2017. Coronary atherosclerosis Hepatobiliary: No focal liver abnormality.Cholecystectomy. Pancreas: Unremarkable. Spleen: Unremarkable. Adrenals/Urinary Tract: Negative adrenals. No hydronephrosis or ureteral stone. Tiny lower pole calculus on the left. Interpolar parenchymal bar. Presumed hemorrhagic cyst in the lower pole left kidney measuring 8 mm. Unremarkable bladder. Stomach/Bowel: No bowel obstruction or inflammation. No notable diverticula. Appendectomy per history. Vascular/Lymphatic: No acute vascular abnormality. Atherosclerosis of the aorta and iliacs. No acute or focal finding. No mass or adenopathy. Reproductive:No pathologic findings. Other: No ascites or pneumoperitoneum. Umbilical hernia repaired with mesh that is lax and bulging. Musculoskeletal: No acute abnormalities. Simple appearing intramuscular lipoma in the lateral left gluteal musculature measuring up to 7 x 5 cm. Advanced lumbar disc degeneration with levoscoliosis. IMPRESSION: 1. No specific explanation for abdominal pain. 2. Umbilical hernia repair with bulging of the mesh. 3. Subpleural fibrosis at the bases, reference chest CT 06/02/2016. 4. Advanced lumbar disc degeneration with levoscoliosis. 5. 7 cm intramuscular left gluteal lipoma. 6. Tiny left renal calculus. Electronically Signed   By: Monte Fantasia M.D.   On: 01/13/2017 13:45   Dg Chest 2 View  Result Date: 01/13/2017 CLINICAL DATA:  Chronic abdominal pain, belching and nausea for months. Endoscopy yesterday. EXAM: CHEST  2 VIEW COMPARISON:  Chest x-rays dated 11/05/2016 05/23/2015. FINDINGS: Heart size is upper normal,  stable. Overall cardiomediastinal silhouette is stable. Atherosclerotic changes noted at the aortic arch. Evaluation of the lungs somewhat limited by patient motion artifact. Lungs  appear grossly clear. No pleural effusion or pneumothorax seen. Mild degenerative spurring throughout the thoracic spine. No acute or suspicious osseous finding. Stable chronic elevation of the right hemidiaphragm. IMPRESSION: 1. No active cardiopulmonary disease. No evidence of pneumonia or pulmonary edema. 2. Aortic atherosclerosis. Electronically Signed   By: Franki Cabot M.D.   On: 01/13/2017 12:08   Assessment / Plan: -Belching, poor appetite, 26 pound weight loss since 05/2016, burning/dyspepsia:  Will treat this with pepcid 20 mg IV BID and carafate suspension ACHS short term to see if this helps.  Will try to avoid PPI as this can trigger microscopic colitis and should probably be cautioned with recent history of Cdiff.  ? Consider trial of Xifaxan.  These symptoms have improved on this regimen. -History of microscopic colitis and recent Cdiff:  More recent Cdiff study negative.  Was not having diarrhea up until her EGD on 7/9, but it has now recurred and continues.  Will recheck stool for Cdiff and if negative then may need to consider flex sig. -AOCK:  Much worse recently.  Continue with IV hydration.  Renal consult has been placed. -Right sided abdominal pain with drainage from her umbilicus:  This may be related to her previous hernia surgery/mesh.  Should follow-up with Dr. Hassell Done, surgery, as an outpatient in regards to this.  It is chronic and not an acute inpatient issue, but she certainly may have chronic inflammation there causing these symptoms.  This pain is actually improved today as well.  **Could also consider mesenteric ischemia as a cause of these issues.  If symptoms continue then could do a MTA but Cr would have to be below 2 so not an option for now. **Will advance diet to soft for now.    LOS: 1 day   Talmage Teaster D.  01/15/2017, 9:59 AM  Pager number [phone]

## 2017-01-15 NOTE — Care Management Note (Signed)
Case Management Note  Patient Details  Name: Jody Ruiz MRN: [id number] Date of Birth: [birth year]  Subjective/Objective:                  Abd.pain and recent weight loss  Action/Plan: Date:  January 15, 2017  Chart reviewed for concurrent status and case management needs.  Will continue to follow patient progress.  Discharge Planning: following for needs  Expected discharge date: [id number]  Velva Harman, BSN, [place], [place]   Expected Discharge Date:   (unknown)               Expected Discharge Plan:  Home/Self Care  In-House Referral:     Discharge planning Services  CM Consult  Post Acute Care Choice:    Choice offered to:     DME Arranged:    DME Agency:     HH Arranged:    [place] Agency:     Status of Service:  In process, will continue to follow  If discussed at Long Length of Stay Meetings, dates discussed:    Additional Comments:  Leeroy Cha, RN 01/15/2017, 9:53 AM

## 2017-01-16 ENCOUNTER — Inpatient Hospital Stay (HOSPITAL_COMMUNITY): Payer: Medicare Other

## 2017-01-16 DIAGNOSIS — N059 Unspecified nephritic syndrome with unspecified morphologic changes: Secondary | ICD-10-CM

## 2017-01-16 LAB — HEPATITIS PANEL, ACUTE
HEP A IGM: NEGATIVE
Hep B C IgM: NEGATIVE
Hepatitis B Surface Ag: NEGATIVE

## 2017-01-16 LAB — ANTISTREPTOLYSIN O TITER: ASO: 29 [IU]/mL (ref 0.0–200.0)

## 2017-01-16 LAB — BASIC METABOLIC PANEL WITH GFR
Anion gap: 8 (ref 5–15)
BUN: 38 mg/dL — ABNORMAL HIGH (ref 6–20)
CO2: 21 mmol/L — ABNORMAL LOW (ref 22–32)
Calcium: 8.1 mg/dL — ABNORMAL LOW (ref 8.9–10.3)
Chloride: 116 mmol/L — ABNORMAL HIGH (ref 101–111)
Creatinine, Ser: 4.31 mg/dL — ABNORMAL HIGH (ref 0.44–1.00)
GFR calc Af Amer: 11 mL/min — ABNORMAL LOW
GFR calc non Af Amer: 9 mL/min — ABNORMAL LOW
Glucose, Bld: 100 mg/dL — ABNORMAL HIGH (ref 65–99)
Potassium: 3.4 mmol/L — ABNORMAL LOW (ref 3.5–5.1)
Sodium: 145 mmol/L (ref 135–145)

## 2017-01-16 LAB — MPO/PR-3 (ANCA) ANTIBODIES: Myeloperoxidase Abs: >100 U/mL — ABNORMAL HIGH (ref 0.0–9.0)

## 2017-01-16 LAB — C3 COMPLEMENT: C3 Complement: 127 mg/dL (ref 82–167)

## 2017-01-16 LAB — C4 COMPLEMENT: Complement C4, Body Fluid: 25 mg/dL (ref 14–44)

## 2017-01-16 LAB — GLOMERULAR BASEMENT MEMBRANE ANTIBODIES: GBM Ab: 3 U (ref 0–20)

## 2017-01-16 MED ORDER — SODIUM CHLORIDE 0.9 % IV SOLN
250.0000 mg | Freq: Two times a day (BID) | INTRAVENOUS | Status: AC
  Administered 2017-01-16 – 2017-01-19 (×6): 250 mg via INTRAVENOUS
  Filled 2017-01-16 (×7): qty 2

## 2017-01-16 MED ORDER — AMLODIPINE BESYLATE 5 MG PO TABS
5.0000 mg | ORAL_TABLET | Freq: Every day | ORAL | Status: DC
  Administered 2017-01-17 – 2017-01-22 (×6): 5 mg via ORAL
  Filled 2017-01-16 (×6): qty 1

## 2017-01-16 MED ORDER — AMLODIPINE BESYLATE 5 MG PO TABS
2.5000 mg | ORAL_TABLET | Freq: Every day | ORAL | Status: DC
  Administered 2017-01-16: 2.5 mg via ORAL
  Filled 2017-01-16: qty 1

## 2017-01-16 MED ORDER — SODIUM CHLORIDE 0.9 % IV SOLN
INTRAVENOUS | Status: DC
  Administered 2017-01-16 – 2017-01-17 (×2): via INTRAVENOUS

## 2017-01-16 NOTE — Progress Notes (Signed)
     [facility] Progress Note  Subjective:  GI issues doing better.  Had a loose stool again this AM, but it was followed by another semi-formed stool.  Frustrated about renal issue.  Tearful.  Says that she had an episode of chest pressure/tightness with SOB and dry heaves last night and they said that it was anxiety.  She says that she hates being told that things are from anxiety, but she is clearly tearful and under a lot of stress.  Is tolerating soft diet.  Objective:  Vital signs in last 24 hours: Temp:  [98.2 F (36.8 C)-98.7 F (37.1 C)] 98.2 F (36.8 C) (07/13 0549) Pulse Rate:  [62-69] 62 (07/13 0549) Resp:  [16-18] 16 (07/13 0549) BP: (150-171)/(65-80) 150/65 (07/13 0549) SpO2:  [96 %-98 %] 96 % (07/13 0549) Last BM Date: 01/15/17 General:  Alert, Well-developed, in NAD; tearful Heart:  Regular rate and rhythm; no murmurs Pulm:  No increased WOB. Abdomen:  Soft, non-distended.  BS present.  Non-tender.   Extremities:  Without edema. Neurologic:  Alert and oriented x 4;  grossly normal neurologically.  Intake/Output from previous day: 07/12 0701 - 07/13 0700 In: 3820 [P.O.:720; I.V.:3000; IV Piggyback:100] Out: 2700 [Urine:2700] Intake/Output this shift: Total I/O In: 120 [P.O.:120] Out: -   Lab Results:  Recent Labs  01/13/17 1020 01/14/17 0755 01/15/17 0533  WBC 8.2 7.0 8.1  HGB 10.6* 9.7* 10.1*  HCT 31.1* 28.6* 30.0*  PLT 310 285 301   BMET  Recent Labs  01/14/17 0755 01/15/17 0533 01/16/17 0601  NA 141 143 145  K 3.7 3.5 3.4*  CL 111 114* 116*  CO2 19* 20* 21*  GLUCOSE 101* 118* 100*  BUN 54* 45* 38*  CREATININE 4.91* 4.63* 4.31*  CALCIUM 8.6* 8.7* 8.1*   LFT  Recent Labs  01/14/17 0755  PROT 6.7  ALBUMIN 3.4*  AST 16  ALT 10*  ALKPHOS 63  BILITOT 0.4   Hepatitis Panel  Recent Labs  01/15/17 1456  HEPBSAG Negative  HCVAB <0.1  HEPAIGM Negative  HEPBIGM Negative   Assessment / Plan: -Belching, poor  appetite, 26pound weight loss since 05/2016, burning/dyspepsia: Will treat this with pepcid 20 mg IV BID and carafate suspension ACHS short term to see if this helps. Will try to avoid PPI as this can trigger microscopic colitis and should probably be cautioned with recent history of Cdiff.  These symptoms have improved on this regimen. -History of microscopic colitis and recent Cdiff:  Cdiff negative again on 7/12.  Diarrhea recurred for a few days but seems to be improving again with semi-formed stools. -AOCK: Much worse recently. Continue with IV hydration.  Renal thinks glomerulonephritis.  Awaiting labs and considering renal biopsy. -Right sided abdominal pain with drainage from her umbilicus: This may be related to her previous hernia surgery/mesh. Should follow-up with Dr. Hassell Done, surgery, as an outpatient in regards to this. It is chronic and not an acute inpatient issue, but she certainly may have chronic inflammation there causing these symptoms.  This pain is actually improved currently as well.  *GI issues seem to be improving.  We do not have any plans for procedures/studies currently.  Will see again on Monday if patient is still here.   LOS: 2 days   Lanell Carpenter D.  01/16/2017, 9:28 AM  Pager number [phone]

## 2017-01-16 NOTE — Progress Notes (Signed)
PROGRESS NOTE  Jody Ruiz BOF:[id number] DOB: 03-29-1941 DOA: 01/13/2017 PCP: Burnard Bunting, MD  HPI/Recap of past 24 hours:  She thinks she is given too much fluids, she reports feeling sob and chest tightness during fluids boluse She likes to have her norvasc restarted back She denies being anxious, but she is anxious and tearful during conversation  no vomiting, report some waves of nausea, she is tolerating diet She report another episode of lower ab pain prior to bm this am, this am her bm is semiformed   no fever She is frustrated about the new diagnosis of glomerulonephritis, she reports the  nephrologist plans to biopsy her kidney, and mentioned possible future dialysis    Assessment/Plan: Principal Problem:   Acute kidney injury superimposed on chronic kidney disease ([place]) Active Problems:   Hypertension   Obesity   IPF (idiopathic pulmonary fibrosis) ([place])   HLD (hyperlipidemia)   Microscopic colitis   Diarrhea with dehydration   Belching   Gastroesophageal reflux disease   H/O umbilical hernia repair   Dyspepsia   AKI (acute kidney injury) ([place])   Loose stools   History of Clostridium difficile colitis   Acute kidney injury superimposed on chronic kidney disease ([place]) --Baseline creatinine 1.5. Cr  5.7 on admission. BUN also significantly elevated. -CT abdomen does not show any evidence of urinary obstruction. UA RBC TNTC, 6-30 wbc, she denies urinary symptom, she is reluctant to have abc treatment due to recent h/o cdiff -Avoid nephrotoxic medication.renal dosing meds, start sodium bicarb for metabolic acidosis, avoid hypotension. -cr has not improved as expected after two days of hydration, nephrology consulted. Possible glumerulonephritis ( she also has a h/o pulmonary fibrosis) -will follow nephrology recommendations   Chronic abdominal pain. Diarrhea, History of microscopic colitis. -She  Report chronic lower abdominal pain, Diarrhea with  dehydration. And significant weight loss -she was recently treated for cdiff, repeat cdiff testing on 7/5 was negative -she had an unremarkable ekg on 7/9 -gi consulted, recommend " consider MR-A to eval mesenteric vessels and exclude intestinal angina/vessel disease" , will need cr improvement for the studay - gi also recommend " Could consider flex sig for bx to exclude lymphocytic colitis   -will avoid ppi for now due to concerns of microscopic colitis and h/o cdiff,  -she has been on asa for cad, asa is also associated with colitis, I have discussed with gi, ok to continue asa for now. -on prn Bentyl when necessary at a lower body aches ,Avoid narcotics.  Right sided abdominal pain with scan drainage from her umbilicus:  Per GI" This may be related to her previous hernia surgery/mesh. Should follow-up with Dr. Hassell Done, surgery, as an outpatient in regards to this. It is chronic and not an acute inpatient issue, but she certainly may have chronic inflammation there causing these symptoms" Ct ab/pel: "Umbilical hernia repair with bulging of the mesh"   Essential hypertension.  titrate home bp med to avoid hypotension  In the setting of aki  Chronic anemia. normocytic hgb stable at 10 during this admission   H/o CAD s/p stents: on asa, toprolxl, imdur  Body mass index is 29.38 kg/m.    DVT Prophylaxis: subcutaneous Heparin which patient refused at times  Advance goals of care discussion: full code   Consults:  Gastroenterology  nephrology   Family Communication: patient  Disposition: not ready for discharge yet   Procedures:  none  Antibiotics:  none   Objective: BP (!) 150/65 (BP Location: Right Arm)  Pulse 62   Temp 98.2 F (36.8 C) (Oral)   Resp 16   Wt 85.1 kg (187 lb 9.8 oz)   LMP  (LMP Unknown)   SpO2 96%   BMI 29.38 kg/m   Intake/Output Summary (Last 24 hours) at 01/16/17 0800 Last data filed at 01/16/17 0756  Gross per 24 hour  Intake              3940 ml  Output             2700 ml  Net             1240 ml   Filed Weights   01/14/17 0512  Weight: 85.1 kg (187 lb 9.8 oz)    Exam:   General:  NAD  Cardiovascular: RRR  Respiratory: CTABL  Abdomen: Soft, ND, nontencder today, positive BS  Musculoskeletal: No Edema  Neuro: aaox3  Data Reviewed: Basic Metabolic Panel:  Recent Labs Lab 01/13/17 1020 01/14/17 0755 01/15/17 0533 01/16/17 0601  NA 139 141 143 145  K 3.5 3.7 3.5 3.4*  CL 109 111 114* 116*  CO2 19* 19* 20* 21*  GLUCOSE 119* 101* 118* 100*  BUN 66* 54* 45* 38*  CREATININE 5.71* 4.91* 4.63* 4.31*  CALCIUM 9.0 8.6* 8.7* 8.1*  MG  --  1.7  --   --    Liver Function Tests:  Recent Labs Lab 01/13/17 1020 01/14/17 0755  AST 16 16  ALT 11* 10*  ALKPHOS 69 63  BILITOT 0.6 0.4  PROT 7.6 6.7  ALBUMIN 3.8 3.4*    Recent Labs Lab 01/13/17 1020 01/14/17 0755  LIPASE 77* 87*   No results for input(s): AMMONIA in the last 168 hours. CBC:  Recent Labs Lab 01/13/17 1020 01/14/17 0755 01/15/17 0533  WBC 8.2 7.0 8.1  NEUTROABS  --  4.4 5.6  HGB 10.6* 9.7* 10.1*  HCT 31.1* 28.6* 30.0*  MCV 87.1 88.8 89.3  PLT 310 285 301   Cardiac Enzymes:    Recent Labs Lab 01/13/17 1959  CKTOTAL 51   BNP (last 3 results)  Recent Labs  11/05/16 0755  BNP 65.3    ProBNP (last 3 results) No results for input(s): PROBNP in the last 8760 hours.  CBG: No results for input(s): GLUCAP in the last 168 hours.  Recent Results (from the past 240 hour(s))  Clostridium Difficile by PCR     Status: None   Collection Time: 01/08/17 12:09 PM  Result Value Ref Range Status   Toxigenic C Difficile by pcr Not Detected Not Detected Final    Comment: This test is for use only with liquid or soft stools; performance characteristics of other clinical specimen types have not been established.   This assay was performed by Cepheid GeneXpert(R) PCR. The performance characteristics of this assay  have been determined by [employer]. Performance characteristics refer to the analytical performance of the test.   Urine culture     Status: Abnormal   Collection Time: 01/13/17 11:09 AM  Result Value Ref Range Status   Specimen Description URINE, CLEAN CATCH  Final   Special Requests NONE  Final   Culture (A)  Final    <10,000 COLONIES/mL INSIGNIFICANT GROWTH Performed at [facility], [address] [place], [place] [address]    Report Status 01/14/2017 FINAL  Final  C difficile quick scan w PCR reflex     Status: None   Collection Time: 01/15/17  1:50 PM  Result Value  Ref Range Status   C Diff antigen NEGATIVE NEGATIVE Final   C Diff toxin NEGATIVE NEGATIVE Final   C Diff interpretation No C. difficile detected.  Final     Studies: No results found.  Scheduled Meds: . aspirin EC  81 mg Oral Daily  . heparin  5,000 Units Subcutaneous Q8H  . isosorbide mononitrate  60 mg Oral QHS  . metoprolol succinate  25 mg Oral Daily  . saccharomyces boulardii  250 mg Oral BID  . sodium bicarbonate  650 mg Oral Daily  . sucralfate  1 g Oral TID AC & HS    Continuous Infusions: . sodium chloride 125 mL/hr at 01/15/17 1244  . famotidine (PEPCID) IV 20 mg (01/15/17 2204)     Time spent: 43mins  Shelene Krage MD, PhD  [facility] Pager [phone]. If 7PM-7AM, please contact night-coverage at [email or web address], password [facility] 01/16/2017, 8:00 AM  LOS: 2 days

## 2017-01-16 NOTE — Progress Notes (Signed)
[facility] Progress Note  Subjective:    Vitals:   01/15/17 2039 01/16/17 0549 01/16/17 1050 01/16/17 1346  BP: (!) 157/77 (!) 150/65 (!) 165/80 (!) 144/72  Pulse: 66 62 79 60  Resp: 18 16  13   Temp: 98.4 F (36.9 C) 98.2 F (36.8 C)  98 F (36.7 C)  TempSrc: Oral Oral  Oral  SpO2: 98% 96%  100%  Weight:        Inpatient medications: . amLODipine  2.5 mg Oral Daily  . aspirin EC  81 mg Oral Daily  . heparin  5,000 Units Subcutaneous Q8H  . isosorbide mononitrate  60 mg Oral QHS  . metoprolol succinate  25 mg Oral Daily  . saccharomyces boulardii  250 mg Oral BID  . sodium bicarbonate  650 mg Oral Daily  . sucralfate  1 g Oral TID AC & HS   . famotidine (PEPCID) IV Stopped (01/16/17 1120)   acetaminophen **OR** acetaminophen, dicyclomine, hydrALAZINE, ondansetron **OR** ondansetron (ZOFRAN) IV  Exam: Gen older adult, WDWN, obese, no distress, calm and pleasant No rash, cyanosis or gangrene Sclera anicteric, throat clear  No jvd or bruits Chest clear bilat RRR no MRG Abd soft ntnd no mass or ascites +bs no bruits GU defer MS no joint effusions or deformity Ext no LE or UE edema / no wounds or ulcers Neuro is alert, Ox 3 , nf   Home meds > norvasc, asa, Imdur, Toprol, NTG,  Prilosec, zofran,zantac  IP meds > asa, imdur, toprol, mylicon, NaHCO3, carafate, IVF, pepcid IV, bentyl, hydralzine IV, po contrast 30 cc, zofran,   Urine sediment (by undersigned) > 50-100 rbc's /hpf, +dysmorphic RBC's, +gran casts , some degen cellular casts , WBC 10-25 per hpf  Assess: 1   Acute on CRF - creat 0.9 last year, then up in March to 1.5, and now is in 4-5 range. Came down a little w/ fluids.  Urine sediment looks nephritic. Serologies shows very high anti-MPO Ab's > 100 (0-9). 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 Will need renal bx and IV steroids.  D/ W pt and daughter who agree to proceed. Starting IVF back , chest / CXR are clear.  2  HTN - bp's good on norvasc/  toprol 3  CAD hx stents x 2 4  Pulm fibrosis - mild, not on O2 or immunosuppression   Plan - IVF, IV bolus steroids x 3 days, consult IR for renal biopsy next week   Kelly Splinter MD [facility] pager [phone]   01/16/2017, 4:09 PM    Recent Labs Lab 01/14/17 0755 01/15/17 0533 01/16/17 0601  NA 141 143 145  K 3.7 3.5 3.4*  CL 111 114* 116*  CO2 19* 20* 21*  GLUCOSE 101* 118* 100*  BUN 54* 45* 38*  CREATININE 4.91* 4.63* 4.31*  CALCIUM 8.6* 8.7* 8.1*    Recent Labs Lab 01/13/17 1020 01/14/17 0755  AST 16 16  ALT 11* 10*  ALKPHOS 69 63  BILITOT 0.6 0.4  PROT 7.6 6.7  ALBUMIN 3.8 3.4*    Recent Labs Lab 01/13/17 1020 01/14/17 0755 01/15/17 0533  WBC 8.2 7.0 8.1  NEUTROABS  --  4.4 5.6  HGB 10.6* 9.7* 10.1*  HCT 31.1* 28.6* 30.0*  MCV 87.1 88.8 89.3  PLT 310 285 301   Iron/TIBC/Ferritin/ %Sat No results found for: IRON, TIBC, FERRITIN, IRONPCTSAT

## 2017-01-17 ENCOUNTER — Other Ambulatory Visit: Payer: Self-pay

## 2017-01-17 LAB — BASIC METABOLIC PANEL
ANION GAP: 9 (ref 5–15)
BUN: 38 mg/dL — ABNORMAL HIGH (ref 6–20)
CO2: 21 mmol/L — AB (ref 22–32)
Calcium: 8.4 mg/dL — ABNORMAL LOW (ref 8.9–10.3)
Chloride: 111 mmol/L (ref 101–111)
Creatinine, Ser: 4.27 mg/dL — ABNORMAL HIGH (ref 0.44–1.00)
GFR, EST AFRICAN AMERICAN: 11 mL/min — AB (ref >60)
GFR, EST NON AFRICAN AMERICAN: 9 mL/min — AB (ref >60)
Glucose, Bld: 177 mg/dL — ABNORMAL HIGH (ref 65–99)
Potassium: 3.7 mmol/L (ref 3.5–5.1)
Sodium: 141 mmol/L (ref 135–145)

## 2017-01-17 LAB — ANTI-DNA ANTIBODY, DOUBLE-STRANDED: ds DNA Ab: <1 IU/mL (ref 0–9)

## 2017-01-17 MED ORDER — NITROGLYCERIN 0.4 MG SL SUBL
SUBLINGUAL_TABLET | SUBLINGUAL | Status: AC
  Administered 2017-01-17: 0.4 mg
  Filled 2017-01-17: qty 1

## 2017-01-17 NOTE — Progress Notes (Signed)
PROGRESS NOTE  Jody Ruiz KGY:[id number] DOB: July 11, 1940 DOA: 01/13/2017 PCP: Burnard Bunting, MD  HPI/Recap of past 24 hours:  She is very pleasant this am, no fever, denies pain Denies GI symptoms   Assessment/Plan: Principal Problem:   Acute kidney injury superimposed on chronic kidney disease ([place]) Active Problems:   Hypertension   Obesity   IPF (idiopathic pulmonary fibrosis) ([place])   HLD (hyperlipidemia)   Microscopic colitis   Diarrhea with dehydration   Belching   Gastroesophageal reflux disease   H/O umbilical hernia repair   Dyspepsia   AKI (acute kidney injury) ([place])   Loose stools   History of Clostridium difficile colitis   Glomerulonephritis   Acute kidney injury superimposed on chronic kidney disease ([facility]) --Baseline creatinine 1.5. Cr  5.7 on admission. BUN also significantly elevated. -CT abdomen does not show any evidence of urinary obstruction. UA RBC TNTC, 6-30 wbc, she denies urinary symptom, she is reluctant to have abc treatment due to recent h/o cdiff -Avoid nephrotoxic medication.renal dosing meds, start sodium bicarb for metabolic acidosis, avoid hypotension. -cr has not improved as expected after two days of hydration, nephrology consulted. Possible glumerulonephritis ( she also has a h/o pulmonary fibrosis) -she is started on iv solumedrol by nephrology on 7/13, will follow nephrology recommendations   Chronic abdominal pain. Diarrhea, History of microscopic colitis. -She  Report chronic lower abdominal pain, Diarrhea with dehydration. And significant weight loss -she was recently treated for cdiff, repeat cdiff testing on 7/5 was negative -she had an unremarkable ekg on 7/9 -gi consulted, recommend " consider MR-A to eval mesenteric vessels and exclude intestinal angina/vessel disease" , will need cr improvement for the studay - gi also recommend " Could consider flex sig for bx to exclude lymphocytic colitis   -will avoid ppi for now  due to concerns of microscopic colitis and h/o cdiff,  -she has been on asa for cad, asa is also associated with colitis, I have discussed with gi, ok to continue asa for now. -on prn Bentyl when necessary at a lower body aches ,Avoid narcotics. -gi symptoms seems has improved.  -will follow Gi recommendation.  Right sided abdominal pain with scan drainage from her umbilicus:  Per GI" This may be related to her previous hernia surgery/mesh. Should follow-up with Dr. Hassell Done, surgery, as an outpatient in regards to this. It is chronic and not an acute inpatient issue, but she certainly may have chronic inflammation there causing these symptoms" Ct ab/pel: "Umbilical hernia repair with bulging of the mesh"   Essential hypertension.  titrate home bp med to avoid hypotension  In the setting of aki  Chronic anemia. normocytic hgb stable at 10 during this admission   H/o CAD s/p stents: on asa, toprolxl, imdur  Body mass index is 31.92 kg/m.    DVT Prophylaxis: subcutaneous Heparin which patient refuses at times  Advance goals of care discussion: full code   Consults:  Gastroenterology  nephrology   Family Communication: patient  Disposition: not ready for discharge yet   Procedures:  none  Antibiotics:  none   Objective: BP (!) 153/83 (BP Location: Right Arm)   Pulse 62   Temp 98 F (36.7 C) (Oral)   Resp 16   Wt 92.4 kg (203 lb 12.8 oz)   LMP  (LMP Unknown)   SpO2 98%   BMI 31.92 kg/m   Intake/Output Summary (Last 24 hours) at 01/17/17 1258 Last data filed at 01/17/17 0803  Gross per 24 hour  Intake             2214 ml  Output             1900 ml  Net              314 ml   Filed Weights   01/17/17 0524 01/17/17 1040 01/17/17 1041  Weight: 91.9 kg (202 lb 11.3 oz) 94.6 kg (208 lb 8.9 oz) 92.4 kg (203 lb 12.8 oz)    Exam:   General:  NAD, pleasant this am  Cardiovascular: RRR  Respiratory: CTABL  Abdomen: Soft, ND, nontencder , positive  BS  Musculoskeletal: No Edema  Neuro: aaox3  Data Reviewed: Basic Metabolic Panel:  Recent Labs Lab 01/13/17 1020 01/14/17 0755 01/15/17 0533 01/16/17 0601 01/17/17 0549  NA 139 141 143 145 141  K 3.5 3.7 3.5 3.4* 3.7  CL 109 111 114* 116* 111  CO2 19* 19* 20* 21* 21*  GLUCOSE 119* 101* 118* 100* 177*  BUN 66* 54* 45* 38* 38*  CREATININE 5.71* 4.91* 4.63* 4.31* 4.27*  CALCIUM 9.0 8.6* 8.7* 8.1* 8.4*  MG  --  1.7  --   --   --    Liver Function Tests:  Recent Labs Lab 01/13/17 1020 01/14/17 0755  AST 16 16  ALT 11* 10*  ALKPHOS 69 63  BILITOT 0.6 0.4  PROT 7.6 6.7  ALBUMIN 3.8 3.4*    Recent Labs Lab 01/13/17 1020 01/14/17 0755  LIPASE 77* 87*   No results for input(s): AMMONIA in the last 168 hours. CBC:  Recent Labs Lab 01/13/17 1020 01/14/17 0755 01/15/17 0533  WBC 8.2 7.0 8.1  NEUTROABS  --  4.4 5.6  HGB 10.6* 9.7* 10.1*  HCT 31.1* 28.6* 30.0*  MCV 87.1 88.8 89.3  PLT 310 285 301   Cardiac Enzymes:    Recent Labs Lab 01/13/17 1959  CKTOTAL 51   BNP (last 3 results)  Recent Labs  11/05/16 0755  BNP 65.3    ProBNP (last 3 results) No results for input(s): PROBNP in the last 8760 hours.  CBG: No results for input(s): GLUCAP in the last 168 hours.  Recent Results (from the past 240 hour(s))  Clostridium Difficile by PCR     Status: None   Collection Time: 01/08/17 12:09 PM  Result Value Ref Range Status   Toxigenic C Difficile by pcr Not Detected Not Detected Final    Comment: This test is for use only with liquid or soft stools; performance characteristics of other clinical specimen types have not been established.   This assay was performed by Cepheid GeneXpert(R) PCR. The performance characteristics of this assay have been determined by [employer]. Performance characteristics refer to the analytical performance of the test.   Urine culture     Status: Abnormal   Collection Time: 01/13/17 11:09 AM  Result  Value Ref Range Status   Specimen Description URINE, CLEAN CATCH  Final   Special Requests NONE  Final   Culture (A)  Final    <10,000 COLONIES/mL INSIGNIFICANT GROWTH Performed at [facility], [address], [place], [place] [address]    Report Status 01/14/2017 FINAL  Final  C difficile quick scan w PCR reflex     Status: None   Collection Time: 01/15/17  1:50 PM  Result Value Ref Range Status   C Diff antigen NEGATIVE NEGATIVE Final   C Diff toxin NEGATIVE NEGATIVE Final   C Diff interpretation No C. difficile  detected.  Final     Studies: No results found.  Scheduled Meds: . amLODipine  5 mg Oral Daily  . aspirin EC  81 mg Oral Daily  . heparin  5,000 Units Subcutaneous Q8H  . isosorbide mononitrate  60 mg Oral QHS  . metoprolol succinate  25 mg Oral Daily  . saccharomyces boulardii  250 mg Oral BID  . sodium bicarbonate  650 mg Oral Daily  . sucralfate  1 g Oral TID AC & HS    Continuous Infusions: . sodium chloride 100 mL/hr at 01/17/17 0751  . famotidine (PEPCID) IV 20 mg (01/17/17 1018)  . methylPREDNISolone (SOLU-MEDROL) injection 250 mg (01/17/17 0528)     Time spent: 15mins  Calan Doren MD, PhD  [facility] Pager [phone]. If 7PM-7AM, please contact night-coverage at [email or web address], password [facility] 01/17/2017, 12:58 PM  LOS: 3 days

## 2017-01-17 NOTE — Progress Notes (Signed)
[facility] Progress Note  Subjective: feeling a lot better, slept better, nausea better, appetite   Vitals:   01/17/17 0524 01/17/17 1040 01/17/17 1041 01/17/17 1500  BP: (!) 153/83   (!) 152/76  Pulse:    79  Resp: 16   18  Temp: 98 F (36.7 C)   97.7 F (36.5 C)  TempSrc: Oral   Oral  SpO2: 98%   100%  Weight: 91.9 kg (202 lb 11.3 oz) 94.6 kg (208 lb 8.9 oz) 92.4 kg (203 lb 12.8 oz)     Inpatient medications: . amLODipine  5 mg Oral Daily  . aspirin EC  81 mg Oral Daily  . heparin  5,000 Units Subcutaneous Q8H  . isosorbide mononitrate  60 mg Oral QHS  . metoprolol succinate  25 mg Oral Daily  . saccharomyces boulardii  250 mg Oral BID  . sodium bicarbonate  650 mg Oral Daily  . sucralfate  1 g Oral TID AC & HS   . sodium chloride 100 mL/hr at 01/17/17 0751  . famotidine (PEPCID) IV Stopped (01/17/17 1048)  . methylPREDNISolone (SOLU-MEDROL) injection 250 mg (01/17/17 0528)   acetaminophen **OR** acetaminophen, dicyclomine, hydrALAZINE, ondansetron **OR** ondansetron (ZOFRAN) IV  Exam: Gen older adult, WDWN , calm No rash, cyanosis or gangrene Sclera anicteric, throat clear  No jvd or bruits Chest clear bilat RRR no MRG Abd soft ntnd no mass or ascites +bs no bruits GU defer MS no joint effusions or deformity Ext no LE or UE edema Neuro is alert, Ox 3 , nf   Home meds > norvasc, asa, Imdur, Toprol, NTG,  Prilosec, zofran,zantac  IP meds > asa, imdur, toprol, mylicon, NaHCO3, carafate, IVF, pepcid IV, bentyl, hydralzine IV, po contrast 30 cc, zofran,   Urine sediment (by undersigned) > 50-100 rbc's /hpf, +dysmorphic RBC's, +gran casts , some degen cellular casts , WBC 10-25 per hpf  Assess: 1   Acute on CRF - creat 0.9 last year, then up in March to 1.5, and now is in 4-5 range. Came down a little w/ fluids.  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  Creat is leveling off in the low 4's, as expected.   Will cont IVF another 24 hrs.  Will need renal bx, I have consulted IR for this.   2  HTN - bp's good on norvasc/ toprol 3  CAD hx stents x 2 4  Pulm fibrosis - mild, not on O2 or immunosuppression, seen by Dr Elsworth Soho and also at IPF clinic at [facility] and no specific dx given 5  Hx microscopic colitis   Plan - IVF, IV bolus steroids x 3 days, consulting IR for renal biopsy next wk   Kelly Splinter MD [facility] pager [phone]   01/17/2017, 6:16 PM    Recent Labs Lab 01/15/17 0533 01/16/17 0601 01/17/17 0549  NA 143 145 141  K 3.5 3.4* 3.7  CL 114* 116* 111  CO2 20* 21* 21*  GLUCOSE 118* 100* 177*  BUN 45* 38* 38*  CREATININE 4.63* 4.31* 4.27*  CALCIUM 8.7* 8.1* 8.4*    Recent Labs Lab 01/13/17 1020 01/14/17 0755  AST 16 16  ALT 11* 10*  ALKPHOS 69 63  BILITOT 0.6 0.4  PROT 7.6 6.7  ALBUMIN 3.8 3.4*    Recent Labs Lab 01/13/17 1020 01/14/17 0755 01/15/17 0533  WBC 8.2 7.0 8.1  NEUTROABS  --  4.4 5.6  HGB 10.6* 9.7* 10.1*  HCT 31.1*  28.6* 30.0*  MCV 87.1 88.8 89.3  PLT 310 285 301   Iron/TIBC/Ferritin/ %Sat No results found for: IRON, TIBC, FERRITIN, IRONPCTSAT

## 2017-01-17 NOTE — Progress Notes (Addendum)
Pt  c/o her " heart racing", this evening heart sound auscultated, heart rhythm sounded abnormal. EKG completed   Atrial Fib with RVR, v/s 173/94, 98,95  Per the  Pt she has been off her bp meds Toprol, Imdur, Norvasc do to ARF. [place] PA aware, order given to monitor the Pt and place on teley at this time, I will continue to monitor.

## 2017-01-18 ENCOUNTER — Encounter (HOSPITAL_COMMUNITY): Payer: Self-pay | Admitting: Physician Assistant

## 2017-01-18 DIAGNOSIS — R0602 Shortness of breath: Secondary | ICD-10-CM

## 2017-01-18 DIAGNOSIS — I4891 Unspecified atrial fibrillation: Secondary | ICD-10-CM

## 2017-01-18 DIAGNOSIS — I495 Sick sinus syndrome: Secondary | ICD-10-CM

## 2017-01-18 LAB — BASIC METABOLIC PANEL
Anion gap: 10 (ref 5–15)
BUN: 44 mg/dL — AB (ref 6–20)
CHLORIDE: 113 mmol/L — AB (ref 101–111)
CO2: 17 mmol/L — ABNORMAL LOW (ref 22–32)
CREATININE: 4.36 mg/dL — AB (ref 0.44–1.00)
Calcium: 8.4 mg/dL — ABNORMAL LOW (ref 8.9–10.3)
GFR calc Af Amer: 11 mL/min — ABNORMAL LOW (ref >60)
GFR, EST NON AFRICAN AMERICAN: 9 mL/min — AB (ref >60)
GLUCOSE: 175 mg/dL — AB (ref 65–99)
POTASSIUM: 3.6 mmol/L (ref 3.5–5.1)
Sodium: 140 mmol/L (ref 135–145)

## 2017-01-18 LAB — TSH: TSH: 1.242 u[IU]/mL (ref 0.350–4.500)

## 2017-01-18 LAB — MAGNESIUM: Magnesium: 1.4 mg/dL — ABNORMAL LOW (ref 1.7–2.4)

## 2017-01-18 LAB — PROTIME-INR
INR: 1.11
Prothrombin Time: 14.3 seconds (ref 11.4–15.2)

## 2017-01-18 LAB — APTT: APTT: 33 s (ref 24–36)

## 2017-01-18 MED ORDER — STERILE WATER FOR INJECTION IV SOLN
INTRAVENOUS | Status: DC
  Administered 2017-01-18: 19:00:00 via INTRAVENOUS
  Filled 2017-01-18: qty 900

## 2017-01-18 MED ORDER — MAGNESIUM SULFATE IN D5W 1-5 GM/100ML-% IV SOLN
1.0000 g | Freq: Once | INTRAVENOUS | Status: AC
Start: 2017-01-18 — End: 2017-01-18
  Administered 2017-01-18: 1 g via INTRAVENOUS
  Filled 2017-01-18: qty 100

## 2017-01-18 MED ORDER — METOPROLOL TARTRATE 25 MG PO TABS
25.0000 mg | ORAL_TABLET | Freq: Two times a day (BID) | ORAL | Status: DC
  Administered 2017-01-19 – 2017-01-22 (×7): 25 mg via ORAL
  Filled 2017-01-18 (×7): qty 1

## 2017-01-18 NOTE — Progress Notes (Signed)
[facility] Progress Note  Subjective: "had a rough night", had afib/ RVR, back in sinus rhythm now.  Seen by cardiology, plan is for bid metoprolol. Hx of tachy-brady syndrome as well, may need PPM at sometime.    Vitals:   01/17/17 2103 01/18/17 0013 01/18/17 0537 01/18/17 1352  BP: (!) 173/94 (!) 149/98 (!) 152/84 (!) 148/64  Pulse: 98 (!) 55 71 73  Resp:    18  Temp:  97.8 F (36.6 C) 98.2 F (36.8 C) 98.4 F (36.9 C)  TempSrc:  Oral Oral Oral  SpO2: 95% 98% 97% 99%  Weight:  95.6 kg (210 lb 12.2 oz)      Inpatient medications: . amLODipine  5 mg Oral Daily  . aspirin EC  81 mg Oral Daily  . isosorbide mononitrate  60 mg Oral QHS  . [START ON 01/19/2017] metoprolol tartrate  25 mg Oral BID  . saccharomyces boulardii  250 mg Oral BID  . sodium bicarbonate  650 mg Oral Daily  . sucralfate  1 g Oral TID AC & HS   . sodium chloride 100 mL/hr at 01/17/17 0751  . famotidine (PEPCID) IV Stopped (01/18/17 1201)  . magnesium sulfate 1 - 4 g bolus IVPB    . methylPREDNISolone (SOLU-MEDROL) injection Stopped (01/18/17 0726)   acetaminophen **OR** acetaminophen, dicyclomine, hydrALAZINE, ondansetron **OR** ondansetron (ZOFRAN) IV  Exam: Gen older adult, WDWN , calm No rash, cyanosis or gangrene Sclera anicteric, throat clear  No jvd or bruits Chest clear bilat RRR no MRG Abd soft ntnd no mass or ascites +bs no bruits GU defer MS no joint effusions or deformity Ext no LE or UE edema Neuro is alert, Ox 3 , nf   Home meds > norvasc, asa, Imdur, Toprol, NTG,  Prilosec, zofran,zantac  IP meds > asa, imdur, toprol, mylicon, NaHCO3, carafate, IVF, pepcid IV, bentyl, hydralzine IV, po contrast 30 cc, zofran,   Urine sediment (by undersigned) > 50-100 rbc's /hpf, +dysmorphic RBC's, +gran casts , some degen cellular casts , WBC 10-25 per hpf  Assess: 1   Renal failure - creat 0.9 last year, up to 1.5 in March, now creat leveled off at 4.3. 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  IR consulted for renal bx, they have seen pt this am and plan is for biopsy tomorrow per pt.   2  HTN - bp's good on norvasc/ toprol 3  AFib/ RVR - cardiology seeing, on BB for now and back in NSR. 4  CAD hx stents x 2 4  Pulm fibrosis - mild, not on O2 or immunosuppression, seen by Dr Elsworth Soho and also at [facility] at [facility] no specific dx noted in the records 5  Hx microscopic colitis   Plan - change to bicarb gtt, lower rate. Biopsy per IR prob tomorrow. Last dose of 3 days bolus steroids is due tomorrow am.    Kelly Splinter MD [facility] pager [phone]   01/18/2017, 5:25 PM    Recent Labs Lab 01/16/17 0601 01/17/17 0549 01/18/17 0541  NA 145 141 140  K 3.4* 3.7 3.6  CL 116* 111 113*  CO2 21* 21* 17*  GLUCOSE 100* 177* 175*  BUN 38* 38* 44*  CREATININE 4.31* 4.27* 4.36*  CALCIUM 8.1* 8.4* 8.4*    Recent Labs Lab 01/13/17 1020 01/14/17 0755  AST 16 16  ALT 11* 10*  ALKPHOS 69 63  BILITOT 0.6 0.4  PROT 7.6 6.7  ALBUMIN  3.8 3.4*    Recent Labs Lab 01/13/17 1020 01/14/17 0755 01/15/17 0533  WBC 8.2 7.0 8.1  NEUTROABS  --  4.4 5.6  HGB 10.6* 9.7* 10.1*  HCT 31.1* 28.6* 30.0*  MCV 87.1 88.8 89.3  PLT 310 285 301   Iron/TIBC/Ferritin/ %Sat No results found for: IRON, TIBC, FERRITIN, IRONPCTSAT

## 2017-01-18 NOTE — Progress Notes (Signed)
Surry PA aware that the pt had a 3.33 sec pause HR 120's, 149/98, 98. No orders given at this time I will continue to assess.

## 2017-01-18 NOTE — Consult Note (Signed)
Cardiology Consultation:   Patient ID: Jody Ruiz; [id number]; 04-20-1941   Admit date: 01/13/2017 Date of Consult: 01/18/2017  Primary Care Provider: Burnard Bunting, MD Primary Cardiologist: Dr. Martinique Primary Electrophysiologist:  Dr. Lovena Le   Patient Profile:   Jody Ruiz is a 76 y.o. female with a hx of CAD s/p NSTEMI/PCI, CKD, hypertension, hyperlipidemia, chronic normocytic anemia OSA and pulmonary fibrosis who is being seen today for the evaluation of atrial fibrillation with RVR at the request of Dr. Florencia Reasons.  History of Present Illness:   Jody Ruiz was initially admitted 7/10 with nausea and abdominal pain.  She was found to have acute on chronic renal failure.  Her baseline creatinine is around 1.5 and it was up to 5.7 on admission.  Urinalysis was concerning for infection but patient has been hesitant about antibiotics due to a recent C. Diff infection.  Nephrology has been following and she is scheduled for renal biopsy tomorrow.  There is concern for glomerulonephritis and she has started IV steroids.  Last night she had an episode of palpitations that were associated with shortness of breath and chest pressure.  She was placed on telemetry and found to be in atrial fibrillation with rates in the 140s. She also had post-conversion pauses up to 3.3 seconds.    Jody Ruiz had a NSTEMI 04/2012 and had Promus DES in the proximal and mid LAD.  She had a repeat cath 07/2012 that showed patent stents.  She had a [employer] 8/201 that showed apical ischemia but subsequent cath was unremarkable.  She was admitted in December 2015 with chest pain, cough, and dyspnea. Right and left heart cath essentially normal- stents patent. Normal pulmonary pressures. Echo was normal at that time. She was seen at the end of August 2017 with atypical chest pain. Myvoview study was normal. Echo showed mild LVH-otherwise normal.  She was admitted with chest pain 11/2016 and cardiac  enzymes were negative.  She was noted to have tachy/brady syndrome with both sinus tachycardia and bradycardia.  She reported palpitations and had a 30 day event monitor that showed only PACs and PVCs.  She was not thought to need a PPM at that time.    Past Medical History:  Diagnosis Date  . Arthritis   . Borderline hypothyroidism   . CAD (coronary artery disease)    a. 04/2012 NSTEMI/Cath:  pLAD 90%, mLAD 70-80% (long) (3x16 & 2.5x28 Promus DES to p/m LAD), pD1 70-80%, pRI 30% (small), CFX 20%, OM1 40%, mOM2 30%, pRCA 30%, PDA 30-40%, pPLB 70%, then mid 90%, EF 65%;  b. 03/2013 Abnl CL w/apical isch;  c. 04/2013 Cath: LM nl, LAD patent stents, LCX <20, RCA 30p PDA 40-50 EF 55-60% ->Cont Med Rx.  c. CP s/p LHC with stable dz and patent stents--> Rx medically   . Cataract   . Dyspnea    a. CP and SOB 06/2012 => Ticagrelor changed to Plavix  . History of Clostridium difficile infection   . HLD (hyperlipidemia)    intolerant to statins  . Hx of adenomatous colonic polyps   . Hx of echocardiogram    a. Echo 2/14:  mild LVH, EF 55-65%, Gr 1 diast dysfn, mild LAE  . Hypertension   . Idiopathic pulmonary fibrosis   . Internal hemorrhoids   . Lung nodules   . Microscopic colitis   . Myocardial infarction ([place]) 2013  . Obesity   . OSA (obstructive sleep apnea)    a. on  CPAP  . Sleep apnea    cpap    Past Surgical History:  Procedure Laterality Date  . APPENDECTOMY  1994  . BREAST BIOPSY  1965, 1975   bilateral  . CARDIAC CATHETERIZATION     x4  . CHOLECYSTECTOMY  2004  . CORONARY STENT PLACEMENT  04/2012   x2  . KNEE ARTHROSCOPY  2000   bilateral  . LEFT AND RIGHT HEART CATHETERIZATION WITH CORONARY ANGIOGRAM N/A 08/02/2014   Procedure: LEFT AND RIGHT HEART CATHETERIZATION WITH CORONARY ANGIOGRAM;  Surgeon: Burnell Blanks, MD;  Location: [facility] CATH LAB;  Service: Cardiovascular;  Laterality: N/A;  . LEFT HEART CATHETERIZATION WITH CORONARY ANGIOGRAM N/A 04/29/2012    Procedure: LEFT HEART CATHETERIZATION WITH CORONARY ANGIOGRAM;  Surgeon: Peter M Martinique, MD;  Location: [facility] CATH LAB;  Service: Cardiovascular;  Laterality: N/A;  . LEFT HEART CATHETERIZATION WITH CORONARY ANGIOGRAM N/A 08/03/2012   Procedure: LEFT HEART CATHETERIZATION WITH CORONARY ANGIOGRAM;  Surgeon: Burnell Blanks, MD;  Location: [facility] CATH LAB;  Service: Cardiovascular;  Laterality: N/A;  . LEFT HEART CATHETERIZATION WITH CORONARY ANGIOGRAM N/A 04/12/2013   Procedure: LEFT HEART CATHETERIZATION WITH CORONARY ANGIOGRAM;  Surgeon: Peter M Martinique, MD;  Location: [facility] CATH LAB;  Service: Cardiovascular;  Laterality: N/A;  . PERCUTANEOUS CORONARY STENT INTERVENTION (PCI-S) N/A 04/29/2012   Procedure: PERCUTANEOUS CORONARY STENT INTERVENTION (PCI-S);  Surgeon: Peter M Martinique, MD;  Location: [facility] CATH LAB;  Service: Cardiovascular;  Laterality: N/A;  . SHOULDER ARTHROSCOPY  2012   left  . vaginal polypectomy  2004  . VENTRAL HERNIA REPAIR  2005     Inpatient Medications: Scheduled Meds: . amLODipine  5 mg Oral Daily  . aspirin EC  81 mg Oral Daily  . heparin  5,000 Units Subcutaneous Q8H  . isosorbide mononitrate  60 mg Oral QHS  . metoprolol succinate  25 mg Oral Daily  . saccharomyces boulardii  250 mg Oral BID  . sodium bicarbonate  650 mg Oral Daily  . sucralfate  1 g Oral TID AC & HS   Continuous Infusions: . sodium chloride 100 mL/hr at 01/17/17 0751  . famotidine (PEPCID) IV 20 mg (01/18/17 1131)  . methylPREDNISolone (SOLU-MEDROL) injection Stopped (01/18/17 0726)   PRN Meds: acetaminophen **OR** acetaminophen, dicyclomine, hydrALAZINE, ondansetron **OR** ondansetron (ZOFRAN) IV  Allergies:    Allergies  Allergen Reactions  . Brilinta [Ticagrelor] Shortness Of Breath, Other (See Comments) and Cough    Reaction:  Fatigue  . Lisinopril Shortness Of Breath and Cough  . Statins Other (See Comments)    Reaction:  Muscle pain/weakness  . Zetia [Ezetimibe] Other (See Comments)      Reaction:  Muscle pain/weakness   . Macrodantin [Nitrofurantoin Macrocrystal] Rash  . Sulfa Antibiotics Rash    Social History:   Social History   Social History  . Marital status: Married    Spouse name: N/A  . Number of children: 3  . Years of education: N/A   Occupational History  . Retired Therapist, sports Retired   Social History Main Topics  . Smoking status: Never Smoker  . Smokeless tobacco: Never Used     Comment: second hand smoke   . Alcohol use No  . Drug use: No  . Sexual activity: Not on file   Other Topics Concern  . Not on file   Social History Narrative  . No narrative on file    Family History:   The patient's family history includes Breast cancer in her sister; Colon cancer (age  of onset: 69) in her sister; Colon polyps in her father; Heart disease (age of onset: 78) in her brother; Heart disease (age of onset: 68) in her mother; Heart disease (age of onset: 73) in her father; Kidney disease in her father; Prostate cancer in her father. There is no history of Esophageal cancer, Rectal cancer, or Stomach cancer.  ROS:  Please see the history of present illness.  Review of Systems  Constitution: Positive for weakness, malaise/fatigue and weight loss.  Gastrointestinal: Positive for abdominal pain and diarrhea.    All other ROS reviewed and negative.     Physical Exam/Data:   Vitals:   01/17/17 2026 01/17/17 2103 01/18/17 0013 01/18/17 0537  BP: (!) 161/81 (!) 173/94 (!) 149/98 (!) 152/84  Pulse:  98 (!) 55 71  Resp: 18     Temp: 98 F (36.7 C)  97.8 F (36.6 C) 98.2 F (36.8 C)  TempSrc: Oral  Oral Oral  SpO2: 98% 95% 98% 97%  Weight:   95.6 kg (210 lb 12.2 oz)     Intake/Output Summary (Last 24 hours) at 01/18/17 1308 Last data filed at 01/18/17 0900  Gross per 24 hour  Intake             3480 ml  Output             2700 ml  Net              780 ml   Filed Weights   01/17/17 1040 01/17/17 1041 01/18/17 0013  Weight: 94.6 kg (208 lb 8.9 oz)  92.4 kg (203 lb 12.8 oz) 95.6 kg (210 lb 12.2 oz)   Body mass index is 33.01 kg/m.  General:  Well nourished, well developed, in no acute distress HEENT: normal Lymph: no adenopathy Neck: no JVD Endocrine:  No thryomegaly Vascular: No carotid bruits; FA pulses 2+ bilaterally without bruits  Cardiac:  normal S1, S2; RRR; no murmur  Lungs:  clear to auscultation bilaterally, no wheezing, rhonchi or rales  Abd: soft, nontender, no hepatomegaly  Ext: no edema Musculoskeletal:  No deformities, BUE and BLE strength normal and equal Skin: warm and dry  Neuro:  CNs 2-12 intact, no focal abnormalities noted Psych:  Normal affect   EKG:  The EKG was personally reviewed and demonstrates:  Atrial fibrillation.  Rate 124 bpm.  Nonspecific ST-T changes Telemetry:  Telemetry was personally reviewed and demonstrates:  Currently sinus rhythm.  Overnight atrial fibrillation with rates up to the 140s.    Relevant CV Studies:  30 Day event monitor 11/2016:  Normal sinus rhythm  Isolated PVCs and PVC couplets  Otherwise normal recording.  Lexiscan Myoview 03/21/16:  The left ventricular ejection fraction is normal (55-65%).  Nuclear stress EF: 62%.  Blood pressure demonstrated a hypertensive response to exercise.  T wave inversion was noted during stress in the II, aVF, V4, V5, V6, V3 and V2 leads.  The study is normal.  This is a low risk study.  Echo 03/18/16: Study Conclusions  - Left ventricle: The cavity size was normal. Wall thickness was   increased in a pattern of mild LVH. Systolic function was normal.   The estimated ejection fraction was in the range of 55% to 60%.   Wall motion was normal; there were no regional wall motion   abnormalities. Doppler parameters are consistent with abnormal   left ventricular relaxation (grade 1 diastolic dysfunction). - Left atrium: The atrium was mildly dilated.  Impressions:  - Normal  LV systolic function; grade 1 diastolic  dysfunction; mild   LVH; mild LAE.   Laboratory Data:  Chemistry Recent Labs Lab 01/16/17 0601 01/17/17 0549 01/18/17 0541  NA 145 141 140  K 3.4* 3.7 3.6  CL 116* 111 113*  CO2 21* 21* 17*  GLUCOSE 100* 177* 175*  BUN 38* 38* 44*  CREATININE 4.31* 4.27* 4.36*  CALCIUM 8.1* 8.4* 8.4*  GFRNONAA 9* 9* 9*  GFRAA 11* 11* 11*  ANIONGAP 8 9 10      Recent Labs Lab 01/13/17 1020 01/14/17 0755  PROT 7.6 6.7  ALBUMIN 3.8 3.4*  AST 16 16  ALT 11* 10*  ALKPHOS 69 63  BILITOT 0.6 0.4   Hematology Recent Labs Lab 01/13/17 1020 01/14/17 0755 01/15/17 0533  WBC 8.2 7.0 8.1  RBC 3.57* 3.22* 3.36*  HGB 10.6* 9.7* 10.1*  HCT 31.1* 28.6* 30.0*  MCV 87.1 88.8 89.3  MCH 29.7 30.1 30.1  MCHC 34.1 33.9 33.7  RDW 12.6 12.8 13.0  PLT 310 285 301   Cardiac EnzymesNo results for input(s): TROPONINI in the last 168 hours. No results for input(s): TROPIPOC in the last 168 hours.  BNPNo results for input(s): BNP, PROBNP in the last 168 hours.  DDimer No results for input(s): DDIMER in the last 168 hours.  Radiology/Studies:  Dg Chest Port 1 View  Result Date: 01/16/2017 CLINICAL DATA:  Short of breath EXAM: PORTABLE CHEST 1 VIEW COMPARISON:  01/13/2017 FINDINGS: Normal mediastinum and cardiac silhouette. Normal pulmonary vasculature. No evidence of effusion, infiltrate, or pneumothorax. No acute bony abnormality. IMPRESSION: No acute cardiopulmonary process. Electronically Signed   By: Suzy Bouchard M.D.   On: 01/16/2017 10:32    Assessment and Plan:   # Paroxysmal atrial fibrillation with RVR: # Tachy-brady: Currently in sinus rhythm.  She has likely had paroxysms at home in the past.  We discussed the need for anticoagulation.  We will start this after her renal biopsy when deemed stable per IR.  She will need heparin/warfarin given her current renal function.  We will switch metoprolol succinate 25mg  to metoprolol tartrate 25mg  bid and watch closely for bradycardia.  She will  likely need a pacemaker once stable.  Ms. Broberg TSH has been both high and low in the last 2 years.  Check TSH and free T4.  We will also check a magnesium.  Maintain K>4, Mg >2.  Repeat echo.     Signed, Skeet Latch, MD  01/18/2017 1:08 PM

## 2017-01-18 NOTE — H&P (Signed)
Chief Complaint: Acute Renal Failure  Referring Physician(s): Roney Jaffe  Supervising Physician: Daryll Brod  Patient Status: [name] - In-pt  History of Present Illness: Jody Ruiz is a 76 y.o. female with acute on CKD.    She presented to the ED on 01/13/2017 c/o abdominal pain.  Creat was 5.71 on admissoin and down to 4.36 today with IVF's.  Dr. Jonnie Finner was consulted and he has requested a random renal biopsy.   Ms. Helgeson  denies any hx of kidney fialure.   She reports "murky brown" urine noted over the last few weeks.    She denies fevers, flank pain, or abd pain. She does not take OTC meds and no NSAIDs  She tells me she has an extensive cardiac history which included MI/ stents and Afib.  She also has HTN and pulmonary fibrosis.    She tells me she did not have a good night last night and thinks she went into Afib. She also says "I must have converted back over to sinus because I don't feel it this morning".  She is hesitant to proceed with the biopsy given her history and wants to speak with her other providers involved before we proceed.     Past Medical History:  Diagnosis Date  . Arthritis   . Borderline hypothyroidism   . CAD (coronary artery disease)    a. 04/2012 NSTEMI/Cath:  pLAD 90%, mLAD 70-80% (long) (3x16 & 2.5x28 Promus DES to p/m LAD), pD1 70-80%, pRI 30% (small), CFX 20%, OM1 40%, mOM2 30%, pRCA 30%, PDA 30-40%, pPLB 70%, then mid 90%, EF 65%;  b. 03/2013 Abnl CL w/apical isch;  c. 04/2013 Cath: LM nl, LAD patent stents, LCX <20, RCA 30p PDA 40-50 EF 55-60% ->Cont Med Rx.  c. CP s/p LHC with stable dz and patent stents--> Rx medically   . Cataract   . Dyspnea    a. CP and SOB 06/2012 => Ticagrelor changed to Plavix  . History of Clostridium difficile infection   . HLD (hyperlipidemia)    intolerant to statins  . Hx of adenomatous colonic polyps   . Hx of echocardiogram    a. Echo 2/14:  mild LVH, EF 55-65%, Gr 1 diast dysfn, mild  LAE  . Hypertension   . Idiopathic pulmonary fibrosis   . Internal hemorrhoids   . Lung nodules   . Microscopic colitis   . Myocardial infarction ([place]) 2013  . Obesity   . OSA (obstructive sleep apnea)    a. on CPAP  . Sleep apnea    cpap    Past Surgical History:  Procedure Laterality Date  . APPENDECTOMY  1994  . BREAST BIOPSY  1965, 1975   bilateral  . CARDIAC CATHETERIZATION     x4  . CHOLECYSTECTOMY  2004  . CORONARY STENT PLACEMENT  04/2012   x2  . KNEE ARTHROSCOPY  2000   bilateral  . LEFT AND RIGHT HEART CATHETERIZATION WITH CORONARY ANGIOGRAM N/A 08/02/2014   Procedure: LEFT AND RIGHT HEART CATHETERIZATION WITH CORONARY ANGIOGRAM;  Surgeon: Burnell Blanks, MD;  Location[facility] CATH LAB;  Service: Cardiovascular;  Laterality: N/A;  . LEFT HEART CATHETERIZATION WITH CORONARY ANGIOGRAM N/A 04/29/2012   Procedure: LEFT HEART CATHETERIZATION WITH CORONARY ANGIOGRAM;  Surgeon: Peter M Martinique, MD;  Location: [facility] CATH LAB;  Service: Cardiovascular;  Laterality: N/A;  . LEFT HEART CATHETERIZATION WITH CORONARY ANGIOGRAM N/A 08/03/2012   Procedure: LEFT HEART CATHETERIZATION WITH CORONARY ANGIOGRAM;  Surgeon: Burnell Blanks,  MD;  Location: [place] CATH LAB;  Service: Cardiovascular;  Laterality: N/A;  . LEFT HEART CATHETERIZATION WITH CORONARY ANGIOGRAM N/A 04/12/2013   Procedure: LEFT HEART CATHETERIZATION WITH CORONARY ANGIOGRAM;  Surgeon: Peter M Martinique, MD;  Location: [facility] CATH LAB;  Service: Cardiovascular;  Laterality: N/A;  . PERCUTANEOUS CORONARY STENT INTERVENTION (PCI-S) N/A 04/29/2012   Procedure: PERCUTANEOUS CORONARY STENT INTERVENTION (PCI-S);  Surgeon: Peter M Martinique, MD;  Location: [facility] CATH LAB;  Service: Cardiovascular;  Laterality: N/A;  . SHOULDER ARTHROSCOPY  2012   left  . vaginal polypectomy  2004  . VENTRAL HERNIA REPAIR  2005    Allergies: Brilinta [ticagrelor]; Lisinopril; Statins; Zetia [ezetimibe]; Macrodantin [nitrofurantoin macrocrystal]; and  Sulfa antibiotics  Medications: Prior to Admission medications   Medication Sig Start Date End Date Taking? Authorizing Provider  amLODipine (NORVASC) 5 MG tablet Take 5 mg by mouth daily.   Yes [provider]  aspirin EC 81 MG tablet Take 81 mg by mouth daily.   Yes [provider]  isosorbide mononitrate (IMDUR) 60 MG 24 hr tablet Take 60 mg by mouth at bedtime.   Yes [provider]  metoprolol succinate (TOPROL-XL) 25 MG 24 hr tablet Take 1 tablet (25 mg total) by mouth daily. 11/08/16  Yes Cheryln Manly, NP  nitroGLYCERIN (NITROSTAT) 0.4 MG SL tablet Place 1 tablet (0.4 mg total) under the tongue every 5 (five) minutes x 3 doses as needed for chest pain. 08/03/14  Yes Eileen Stanford, PA-C  omeprazole (PRILOSEC) 20 MG capsule Take 2 capsules (40 mg total) by mouth daily. 01/12/17  Yes Irene Shipper, MD  ondansetron (ZOFRAN) 4 MG tablet Take 1 tablet (4 mg total) by mouth every 8 (eight) hours as needed for nausea or vomiting. 01/08/17  Yes Irene Shipper, MD  ranitidine (ZANTAC) 150 MG tablet Take 1 tablet (150 mg total) by mouth 2 (two) times daily. Patient not taking: Reported on 01/13/2017 01/02/17   Zehr, Laban Emperor, PA-C     Family History  Problem Relation Age of Onset  . Breast cancer Sister   . Prostate cancer Father   . Colon polyps Father   . Heart disease Father 44       CAD, died of ESRD  . Kidney disease Father   . Colon cancer Sister 42  . Heart disease Mother 60       Died acute MI  . Heart disease Brother 45       CABG  . Esophageal cancer Neg Hx   . Rectal cancer Neg Hx   . Stomach cancer Neg Hx     Social History   Social History  . Marital status: Married    Spouse name: N/A  . Number of children: 3  . Years of education: N/A   Occupational History  . Retired Therapist, sports Retired   Social History Main Topics  . Smoking status: Never Smoker  . Smokeless tobacco: Never Used     Comment: second hand smoke   . Alcohol use No  .  Drug use: No  . Sexual activity: Not Asked   Other Topics Concern  . None   Social History Narrative  . None   Review of Systems: A 12 point ROS discussed  Review of Systems  Constitutional: Positive for activity change and fatigue.  HENT: Negative.   Respiratory: Negative.   Cardiovascular: Negative.   Gastrointestinal: Positive for abdominal pain.  Genitourinary: Negative.   Musculoskeletal: Negative.   Skin: Negative.  Neurological: Negative.   Hematological: Negative.   Psychiatric/Behavioral: Negative.     Vital Signs: BP (!) 152/84 (BP Location: Right Arm)   Pulse 71   Temp 98.2 F (36.8 C) (Oral)   Resp 18   Wt 210 lb 12.2 oz (95.6 kg)   LMP  (LMP Unknown)   SpO2 97%   BMI 33.01 kg/m   Physical Exam  Constitutional: She is oriented to person, place, and time.  Obese, NAD  HENT:  Head: Normocephalic and atraumatic.  Eyes: EOM are normal.  Neck: Normal range of motion.  Cardiovascular: Normal rate, regular rhythm and normal heart sounds.   Pulmonary/Chest: Effort normal and breath sounds normal. No respiratory distress. She has no wheezes.  Abdominal: Soft. She exhibits no distension. There is no tenderness.  Musculoskeletal: Normal range of motion.  Neurological: She is alert and oriented to person, place, and time.  Skin: Skin is warm and dry.  Psychiatric: She has a normal mood and affect. Her behavior is normal. Judgment and thought content normal.  Vitals reviewed.   Mallampati Score:  MD Evaluation Airway: WNL Heart: WNL Abdomen: WNL Chest/ Lungs: WNL ASA  Classification: 3 Mallampati/Airway Score: Three (Can open mouth well but has a large tongue.  Go lighter on sedation if possible)  Imaging: Ct Abdomen Pelvis Wo Contrast  Result Date: 01/13/2017 CLINICAL DATA:  Right lower quadrant abdominal pain since January with weight loss EXAM: CT ABDOMEN AND PELVIS WITHOUT CONTRAST TECHNIQUE: Multidetector CT imaging of the abdomen and pelvis was  performed following the standard protocol without IV contrast. COMPARISON:  11/18/2003 FINDINGS: Lower chest: There is subpleural honeycombing in the posterior costophrenic sulci. Mediastinal right lower lobe, also seen on chest CT from 2017. Coronary atherosclerosis Hepatobiliary: No focal liver abnormality.Cholecystectomy. Pancreas: Unremarkable. Spleen: Unremarkable. Adrenals/Urinary Tract: Negative adrenals. No hydronephrosis or ureteral stone. Tiny lower pole calculus on the left. Interpolar parenchymal bar. Presumed hemorrhagic cyst in the lower pole left kidney measuring 8 mm. Unremarkable bladder. Stomach/Bowel: No bowel obstruction or inflammation. No notable diverticula. Appendectomy per history. Vascular/Lymphatic: No acute vascular abnormality. Atherosclerosis of the aorta and iliacs. No acute or focal finding. No mass or adenopathy. Reproductive:No pathologic findings. Other: No ascites or pneumoperitoneum. Umbilical hernia repaired with mesh that is lax and bulging. Musculoskeletal: No acute abnormalities. Simple appearing intramuscular lipoma in the lateral left gluteal musculature measuring up to 7 x 5 cm. Advanced lumbar disc degeneration with levoscoliosis. IMPRESSION: 1. No specific explanation for abdominal pain. 2. Umbilical hernia repair with bulging of the mesh. 3. Subpleural fibrosis at the bases, reference chest CT 06/02/2016. 4. Advanced lumbar disc degeneration with levoscoliosis. 5. 7 cm intramuscular left gluteal lipoma. 6. Tiny left renal calculus. Electronically Signed   By: Monte Fantasia M.D.   On: 01/13/2017 13:45   Dg Chest 2 View  Result Date: 01/13/2017 CLINICAL DATA:  Chronic abdominal pain, belching and nausea for months. Endoscopy yesterday. EXAM: CHEST  2 VIEW COMPARISON:  Chest x-rays dated 11/05/2016 05/23/2015. FINDINGS: Heart size is upper normal, stable. Overall cardiomediastinal silhouette is stable. Atherosclerotic changes noted at the aortic arch. Evaluation of  the lungs somewhat limited by patient motion artifact. Lungs appear grossly clear. No pleural effusion or pneumothorax seen. Mild degenerative spurring throughout the thoracic spine. No acute or suspicious osseous finding. Stable chronic elevation of the right hemidiaphragm. IMPRESSION: 1. No active cardiopulmonary disease. No evidence of pneumonia or pulmonary edema. 2. Aortic atherosclerosis. Electronically Signed   By: Franki Cabot M.D.   On:  01/13/2017 12:08   Dg Chest Port 1 View  Result Date: 01/16/2017 CLINICAL DATA:  Short of breath EXAM: PORTABLE CHEST 1 VIEW COMPARISON:  01/13/2017 FINDINGS: Normal mediastinum and cardiac silhouette. Normal pulmonary vasculature. No evidence of effusion, infiltrate, or pneumothorax. No acute bony abnormality. IMPRESSION: No acute cardiopulmonary process. Electronically Signed   By: Suzy Bouchard M.D.   On: 01/16/2017 10:32    Labs:  CBC:  Recent Labs  11/06/16 0034 01/13/17 1020 01/14/17 0755 01/15/17 0533  WBC 8.9 8.2 7.0 8.1  HGB 10.2* 10.6* 9.7* 10.1*  HCT 31.1* 31.1* 28.6* 30.0*  PLT 334 310 285 301    COAGS:  Recent Labs  11/05/16 0755 01/18/17 0541  INR 1.04 1.11  APTT  --  33    BMP:  Recent Labs  01/15/17 0533 01/16/17 0601 01/17/17 0549 01/18/17 0541  NA 143 145 141 140  K 3.5 3.4* 3.7 3.6  CL 114* 116* 111 113*  CO2 20* 21* 21* 17*  GLUCOSE 118* 100* 177* 175*  BUN 45* 38* 38* 44*  CALCIUM 8.7* 8.1* 8.4* 8.4*  CREATININE 4.63* 4.31* 4.27* 4.36*  GFRNONAA 8* 9* 9* 9*  GFRAA 10* 11* 11* 11*    LIVER FUNCTION TESTS:  Recent Labs  11/05/16 0755 11/05/16 1305 01/13/17 1020 01/14/17 0755  BILITOT 0.9 0.6 0.6 0.4  AST 24 21 16 16   ALT 15 16 11* 10*  ALKPHOS 61 62 69 63  PROT 7.0 6.7 7.6 6.7  ALBUMIN 3.3* 3.5 3.8 3.4*    TUMOR MARKERS: No results for input(s): AFPTM, CEA, CA199, CHROMGRNA in the last 8760 hours.  Assessment and Plan:  Acute on Chronic renal failure.   Question  glomerulonephritis.  I had a lengthy discussion with Ms Spegal. The risks and benefits of the renal biopsy was discussed with the patient including, but not limited to bleeding, infection, damage to adjacent structures or low yield requiring additional tests.  All of the patient's questions were answered.  She will not sign consent until she has further discussion with nephrology and her other doctors involved in her care.  She would like to remain on our schedule for tomorrow as she feels she will likely proceed, she just wants to make certain she is "doing the right thing" when it comes to her other health issues.  We will see her in the morning and if she is agreeable to proceed, will have her sign consent at that time.  Thank you for this interesting consult.  I greatly enjoyed meeting AGUEDA HOUPT and look forward to participating in their care.  A copy of this report was sent to the requesting provider on this date.  Electronically Signed: Murrell Redden, PA-C 01/18/2017, 10:54 AM   I spent a total of 55 Miinutes in face to face in clinical consultation, greater than 50% of which was counseling/coordinating care for renal biopsy

## 2017-01-18 NOTE — Progress Notes (Signed)
PROGRESS NOTE  MARGEART ALLENDER YYT:[id number] DOB: 10-Jun-1941 DOA: 01/13/2017 PCP: Burnard Bunting, MD  HPI/Recap of past 24 hours:   Had d a brief episode of afib/RVR and a few 2-3 pauses last night She is upset this morning  no fever, denies pain Denies GI symptoms   Assessment/Plan: Principal Problem:   Acute kidney injury superimposed on chronic kidney disease ([place]) Active Problems:   Hypertension   Obesity   IPF (idiopathic pulmonary fibrosis) (H[facility])   HLD (hyperlipidemia)   Microscopic colitis   Diarrhea with dehydration   Belching   Gastroesophageal reflux disease   H/O umbilical hernia repair   Dyspepsia   AKI (acute kidney injury) ([place])   Loose stools   History of Clostridium difficile colitis   Glomerulonephritis   Acute kidney injury superimposed on chronic kidney disease (H[facility]) --Baseline creatinine 1.5. Cr  5.7 on admission. BUN also significantly elevated. -CT abdomen does not show any evidence of urinary obstruction. UA RBC TNTC, 6-30 wbc, she denies urinary symptom, she is reluctant to have abc treatment due to recent h/o cdiff -Avoid nephrotoxic medication.renal dosing meds, start sodium bicarb for metabolic acidosis, avoid hypotension. -cr has not improved as expected after two days of hydration, nephrology consulted. Possible glumerulonephritis ( she also has a h/o pulmonary fibrosis) -she is started on iv solumedrol by nephrology on 7/13, she is to have renal biopsy by IR on Monday, will follow nephrology recommendations   New onset of afib/RVR, paroxysmal  On 714-7/15night Back to normal sinus rhythm spontaneously She report h/o osa, she does not want to use hospital cpap, she does not want to bring in her own due to fear of getting her own machine contaminated  Cardiology consulted  Chronic abdominal pain. Diarrhea, History of microscopic colitis. -She  Report chronic lower abdominal pain, Diarrhea with dehydration. And significant weight  loss -she was recently treated for cdiff, repeat cdiff testing on 7/5 was negative -she had an unremarkable ekg on 7/9 -gi consulted, recommend " consider MR-A to eval mesenteric vessels and exclude intestinal angina/vessel disease" , will need cr improvement for the studay - gi also recommend " Could consider flex sig for bx to exclude lymphocytic colitis   -will avoid ppi for now due to concerns of microscopic colitis and h/o cdiff,  -she has been on asa for cad, asa is also associated with colitis, I have discussed with gi, ok to continue asa for now. -on prn Bentyl when necessary at a lower body aches ,Avoid narcotics. -gi symptoms seems has improved.  -will follow Gi recommendation.  Right sided abdominal pain with scan drainage from her umbilicus:  Per GI" This may be related to her previous hernia surgery/mesh. Should follow-up with Dr. Hassell Done, surgery, as an outpatient in regards to this. It is chronic and not an acute inpatient issue, but she certainly may have chronic inflammation there causing these symptoms" Ct ab/pel: "Umbilical hernia repair with bulging of the mesh"   Essential hypertension.  titrate home bp med to avoid hypotension  In the setting of aki  Chronic anemia. normocytic hgb stable at 10 during this admission   H/o CAD s/p stents: on asa, toprolxl, imdur  Body mass index is 33.01 kg/m.   Anxiety: labile mood, cries at times, she does not likely anxiety to be mentioned.   DVT Prophylaxis: subcutaneous Heparin which patient refuses at times  Advance goals of care discussion: full code   Consults:  Gastroenterology  nephrology   Cardiology  Interventional  radiology  Family Communication: patient  Disposition: hopefully soon with nephrology/cardiology clearance   Procedures:  Renal biopsy by interventional radiology planned on 7/16  Antibiotics:  none   Objective: BP (!) 152/84 (BP Location: Right Arm)   Pulse 71   Temp 98.2 F  (36.8 C) (Oral)   Resp 18   Wt 95.6 kg (210 lb 12.2 oz)   LMP  (LMP Unknown)   SpO2 97%   BMI 33.01 kg/m   Intake/Output Summary (Last 24 hours) at 01/18/17 0837 Last data filed at 01/18/17 0600  Gross per 24 hour  Intake             3240 ml  Output             2700 ml  Net              540 ml   Filed Weights   01/17/17 1040 01/17/17 1041 01/18/17 0013  Weight: 94.6 kg (208 lb 8.9 oz) 92.4 kg (203 lb 12.8 oz) 95.6 kg (210 lb 12.2 oz)    Exam:   General:  NAD, not happy this am  Cardiovascular: RRR  Respiratory: CTABL  Abdomen: Soft, ND, nontencder , positive BS  Musculoskeletal: No Edema  Neuro: aaox3  Data Reviewed: Basic Metabolic Panel:  Recent Labs Lab 01/13/17 1020 01/14/17 0755 01/15/17 0533 01/16/17 0601 01/17/17 0549  NA 139 141 143 145 141  K 3.5 3.7 3.5 3.4* 3.7  CL 109 111 114* 116* 111  CO2 19* 19* 20* 21* 21*  GLUCOSE 119* 101* 118* 100* 177*  BUN 66* 54* 45* 38* 38*  CREATININE 5.71* 4.91* 4.63* 4.31* 4.27*  CALCIUM 9.0 8.6* 8.7* 8.1* 8.4*  MG  --  1.7  --   --   --    Liver Function Tests:  Recent Labs Lab 01/13/17 1020 01/14/17 0755  AST 16 16  ALT 11* 10*  ALKPHOS 69 63  BILITOT 0.6 0.4  PROT 7.6 6.7  ALBUMIN 3.8 3.4*    Recent Labs Lab 01/13/17 1020 01/14/17 0755  LIPASE 77* 87*   No results for input(s): AMMONIA in the last 168 hours. CBC:  Recent Labs Lab 01/13/17 1020 01/14/17 0755 01/15/17 0533  WBC 8.2 7.0 8.1  NEUTROABS  --  4.4 5.6  HGB 10.6* 9.7* 10.1*  HCT 31.1* 28.6* 30.0*  MCV 87.1 88.8 89.3  PLT 310 285 301   Cardiac Enzymes:    Recent Labs Lab 01/13/17 1959  CKTOTAL 51   BNP (last 3 results)  Recent Labs  11/05/16 0755  BNP 65.3    ProBNP (last 3 results) No results for input(s): PROBNP in the last 8760 hours.  CBG: No results for input(s): GLUCAP in the last 168 hours.  Recent Results (from the past 240 hour(s))  Clostridium Difficile by PCR     Status: None   Collection  Time: 01/08/17 12:09 PM  Result Value Ref Range Status   Toxigenic C Difficile by pcr Not Detected Not Detected Final    Comment: This test is for use only with liquid or soft stools; performance characteristics of other clinical specimen types have not been established.   This assay was performed by Cepheid GeneXpert(R) PCR. The performance characteristics of this assay have been determined by [employer]. Performance characteristics refer to the analytical performance of the test.   Urine culture     Status: Abnormal   Collection Time: 01/13/17 11:09 AM  Result Value Ref Range Status  Specimen Description URINE, CLEAN CATCH  Final   Special Requests NONE  Final   Culture (A)  Final    <10,000 COLONIES/mL INSIGNIFICANT GROWTH Performed at [facility], [place] [address], [place], [place] [address]    Report Status 01/14/2017 FINAL  Final  C difficile quick scan w PCR reflex     Status: None   Collection Time: 01/15/17  1:50 PM  Result Value Ref Range Status   C Diff antigen NEGATIVE NEGATIVE Final   C Diff toxin NEGATIVE NEGATIVE Final   C Diff interpretation No C. difficile detected.  Final     Studies: No results found.  Scheduled Meds: . amLODipine  5 mg Oral Daily  . aspirin EC  81 mg Oral Daily  . heparin  5,000 Units Subcutaneous Q8H  . isosorbide mononitrate  60 mg Oral QHS  . metoprolol succinate  25 mg Oral Daily  . saccharomyces boulardii  250 mg Oral BID  . sodium bicarbonate  650 mg Oral Daily  . sucralfate  1 g Oral TID AC & HS    Continuous Infusions: . sodium chloride 100 mL/hr at 01/17/17 0751  . famotidine (PEPCID) IV Stopped (01/17/17 2146)  . methylPREDNISolone (SOLU-MEDROL) injection Stopped (01/18/17 0726)     Time spent: 83mins  Jonte Shiller MD, PhD  [facility] Pager [phone]. If 7PM-7AM, please contact night-coverage at [email or web address], password [facility] 01/18/2017, 8:37 AM  LOS: 4 days

## 2017-01-19 ENCOUNTER — Inpatient Hospital Stay (HOSPITAL_COMMUNITY): Payer: Medicare Other

## 2017-01-19 DIAGNOSIS — R197 Diarrhea, unspecified: Secondary | ICD-10-CM

## 2017-01-19 DIAGNOSIS — I340 Nonrheumatic mitral (valve) insufficiency: Secondary | ICD-10-CM

## 2017-01-19 LAB — CBC WITH DIFFERENTIAL/PLATELET
Basophils Absolute: 0 10*3/uL (ref 0.0–0.1)
Basophils Relative: 0 %
EOS ABS: 0 10*3/uL (ref 0.0–0.7)
EOS PCT: 0 %
HCT: 29.2 % — ABNORMAL LOW (ref 36.0–46.0)
Hemoglobin: 10 g/dL — ABNORMAL LOW (ref 12.0–15.0)
LYMPHS PCT: 6 %
Lymphs Abs: 1.1 10*3/uL (ref 0.7–4.0)
MCH: 29.6 pg (ref 26.0–34.0)
MCHC: 34.2 g/dL (ref 30.0–36.0)
MCV: 86.4 fL (ref 78.0–100.0)
MONOS PCT: 1 %
Monocytes Absolute: 0.2 10*3/uL (ref 0.1–1.0)
Neutro Abs: 18.5 10*3/uL — ABNORMAL HIGH (ref 1.7–7.7)
Neutrophils Relative %: 93 %
PLATELETS: 282 10*3/uL (ref 150–400)
RBC: 3.38 MIL/uL — ABNORMAL LOW (ref 3.87–5.11)
RDW: 13.2 % (ref 11.5–15.5)
WBC: 19.8 10*3/uL — ABNORMAL HIGH (ref 4.0–10.5)

## 2017-01-19 LAB — ECHOCARDIOGRAM COMPLETE: Weight: 3463.87 oz

## 2017-01-19 LAB — T4, FREE: FREE T4: 0.79 ng/dL (ref 0.61–1.12)

## 2017-01-19 LAB — BASIC METABOLIC PANEL
Anion gap: 13 (ref 5–15)
BUN: 50 mg/dL — AB (ref 6–20)
CHLORIDE: 107 mmol/L (ref 101–111)
CO2: 19 mmol/L — ABNORMAL LOW (ref 22–32)
CREATININE: 4.02 mg/dL — AB (ref 0.44–1.00)
Calcium: 8.5 mg/dL — ABNORMAL LOW (ref 8.9–10.3)
GFR calc Af Amer: 12 mL/min — ABNORMAL LOW (ref >60)
GFR, EST NON AFRICAN AMERICAN: 10 mL/min — AB (ref >60)
GLUCOSE: 160 mg/dL — AB (ref 65–99)
Potassium: 3.5 mmol/L (ref 3.5–5.1)
SODIUM: 139 mmol/L (ref 135–145)

## 2017-01-19 MED ORDER — HYDRALAZINE HCL 20 MG/ML IJ SOLN
5.0000 mg | Freq: Once | INTRAMUSCULAR | Status: AC
  Administered 2017-01-19: 5 mg via INTRAVENOUS
  Filled 2017-01-19: qty 0.25

## 2017-01-19 MED ORDER — FLUMAZENIL 0.5 MG/5ML IV SOLN
INTRAVENOUS | Status: AC
  Filled 2017-01-19: qty 5

## 2017-01-19 MED ORDER — FENTANYL CITRATE (PF) 100 MCG/2ML IJ SOLN
INTRAMUSCULAR | Status: AC
  Filled 2017-01-19: qty 4

## 2017-01-19 MED ORDER — FAMOTIDINE IN NACL 20-0.9 MG/50ML-% IV SOLN
20.0000 mg | Freq: Two times a day (BID) | INTRAVENOUS | Status: DC
  Administered 2017-01-19 – 2017-01-20 (×2): 20 mg via INTRAVENOUS
  Filled 2017-01-19 (×3): qty 50

## 2017-01-19 MED ORDER — NALOXONE HCL 0.4 MG/ML IJ SOLN
INTRAMUSCULAR | Status: AC
  Filled 2017-01-19: qty 1

## 2017-01-19 MED ORDER — FENTANYL CITRATE (PF) 100 MCG/2ML IJ SOLN
INTRAMUSCULAR | Status: AC | PRN
  Administered 2017-01-19: 25 ug via INTRAVENOUS
  Administered 2017-01-19: 50 ug via INTRAVENOUS

## 2017-01-19 MED ORDER — FAMOTIDINE IN NACL 20-0.9 MG/50ML-% IV SOLN: 20.0000 mg | INTRAVENOUS | Status: DC

## 2017-01-19 MED ORDER — STERILE WATER FOR INJECTION IV SOLN
INTRAVENOUS | Status: DC
  Administered 2017-01-19 (×2): via INTRAVENOUS
  Filled 2017-01-19: qty 900

## 2017-01-19 MED ORDER — MIDAZOLAM HCL 2 MG/2ML IJ SOLN
INTRAMUSCULAR | Status: AC
  Filled 2017-01-19: qty 6

## 2017-01-19 MED ORDER — PREDNISONE 10 MG PO TABS
60.0000 mg | ORAL_TABLET | Freq: Every day | ORAL | Status: DC
  Administered 2017-01-20 – 2017-01-22 (×3): 60 mg via ORAL
  Filled 2017-01-19 (×3): qty 1

## 2017-01-19 MED ORDER — MIDAZOLAM HCL 2 MG/2ML IJ SOLN
INTRAMUSCULAR | Status: AC | PRN
  Administered 2017-01-19: 0.5 mg via INTRAVENOUS
  Administered 2017-01-19: 1 mg via INTRAVENOUS

## 2017-01-19 NOTE — Progress Notes (Signed)
Date:  January 19, 2017  Chart reviewed for concurrent status and case management needs.  Will continue to follow patient progress.  Discharge Planning: following for needs /for renal bx today [id number] Expected discharge date: [id number]  Velva Harman, BSN, [place], [place]

## 2017-01-19 NOTE — Progress Notes (Signed)
[facility] Progress Note  Subjective: s/p renal biopsy today.  Feeling OK.  Dtr and husband at bedside.    Vitals:   01/19/17 1110 01/19/17 1116 01/19/17 1121 01/19/17 1201  BP: (!) 173/79 (!) 163/89 (!) 163/89 (!) 144/57  Pulse: 92 74 86 61  Resp: 18 14 12    Temp:      TempSrc:      SpO2: 93% 96% 100%   Weight:        Inpatient medications: . amLODipine  5 mg Oral Daily  . aspirin EC  81 mg Oral Daily  . fentaNYL      . isosorbide mononitrate  60 mg Oral QHS  . metoprolol tartrate  25 mg Oral BID  . midazolam      . saccharomyces boulardii  250 mg Oral BID  . sodium bicarbonate  650 mg Oral Daily  . sucralfate  1 g Oral TID AC & HS   . famotidine (PEPCID) IV Stopped (01/19/17 1001)  .  sodium bicarbonate (isotonic) infusion in sterile water 40 mL/hr at 01/19/17 0800   acetaminophen **OR** acetaminophen, dicyclomine, hydrALAZINE, ondansetron **OR** ondansetron (ZOFRAN) IV  Exam: GEN older adult, WDWN , calm HEENT EOMI, sclerae anicteric NECK no JVD PULM clear bilaterally  CV RRR  ABD nontender EXT no LE edema NEURO nonfocal  Urine sediment (by Dr Jonnie Finner at time of initial c/s) > 50-100 rbc's /hpf, +dysmorphic RBC's, +gran casts , some degen cellular casts , WBC 10-25 per hpf  Assessment/ Plan: 1   Renal failure - creat 0.9 last year, up to 1.5 in March, creatinine improving and is in the low 4s. 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  S/p renal biopsy.  Getting pulse dose steroids, will convert to PO pred; have discussed with pt the likely use of cytoxan pending biopsy results.  Discussed briefly natural disease history of ANCA-associated GN as well.   2  HTN - bp's good on norvasc/ toprol 3  AFib/ RVR - cardiology seeing, on BB for now and back in NSR. 4  CAD hx stents x 2 4  Pulm fibrosis - mild, not on O2 or immunosuppression, seen by Dr Elsworth Soho and also at [facility] clinic at [facility]; no specific dx noted in the records 5  Hx  microscopic colitis- followed by Dr. Hilarie Fredrickson.   Jody Lips MD [facility] pgr [phone]  01/19/2017, 1:00 PM    Recent Labs Lab 01/17/17 0549 01/18/17 0541 01/19/17 0528  NA 141 140 139  K 3.7 3.6 3.5  CL 111 113* 107  CO2 21* 17* 19*  GLUCOSE 177* 175* 160*  BUN 38* 44* 50*  CREATININE 4.27* 4.36* 4.02*  CALCIUM 8.4* 8.4* 8.5*    Recent Labs Lab 01/13/17 1020 01/14/17 0755  AST 16 16  ALT 11* 10*  ALKPHOS 69 63  BILITOT 0.6 0.4  PROT 7.6 6.7  ALBUMIN 3.8 3.4*    Recent Labs Lab 01/14/17 0755 01/15/17 0533 01/19/17 0528  WBC 7.0 8.1 19.8*  NEUTROABS 4.4 5.6 18.5*  HGB 9.7* 10.1* 10.0*  HCT 28.6* 30.0* 29.2*  MCV 88.8 89.3 86.4  PLT 285 301 282   Iron/TIBC/Ferritin/ %Sat No results found for: IRON, TIBC, FERRITIN, IRONPCTSAT

## 2017-01-19 NOTE — Progress Notes (Addendum)
Progress Note  Patient Name: Jody Ruiz Date of Encounter: 01/19/2017  Primary Cardiologist:   Dr. Martinique  Subjective   She is pending renal biopsy.  No chest pain, palpitations or SOB.    Inpatient Medications    Scheduled Meds: . amLODipine  5 mg Oral Daily  . aspirin EC  81 mg Oral Daily  . fentaNYL      . isosorbide mononitrate  60 mg Oral QHS  . metoprolol tartrate  25 mg Oral BID  . midazolam      . [START ON 01/20/2017] predniSONE  60 mg Oral Q breakfast  . saccharomyces boulardii  250 mg Oral BID  . sodium bicarbonate  650 mg Oral Daily  . sucralfate  1 g Oral TID AC & HS   Continuous Infusions: . famotidine (PEPCID) IV    .  sodium bicarbonate (isotonic) infusion in sterile water 40 mL/hr at 01/19/17 1310   PRN Meds: acetaminophen **OR** acetaminophen, dicyclomine, hydrALAZINE, ondansetron **OR** ondansetron (ZOFRAN) IV   Vital Signs    Vitals:   01/19/17 1110 01/19/17 1116 01/19/17 1121 01/19/17 1201  BP: (!) 173/79 (!) 163/89 (!) 163/89 (!) 144/57  Pulse: 92 74 86 61  Resp: 18 14 12    Temp:      TempSrc:      SpO2: 93% 96% 100%   Weight:        Intake/Output Summary (Last 24 hours) at 01/19/17 1418 Last data filed at 01/19/17 0900  Gross per 24 hour  Intake          1818.67 ml  Output              551 ml  Net          1267.67 ml   Filed Weights   01/17/17 1041 01/18/17 0013 01/19/17 0539  Weight: 203 lb 12.8 oz (92.4 kg) 210 lb 12.2 oz (95.6 kg) 216 lb 7.9 oz (98.2 kg)    Telemetry    NSR, sinus brady - Personally Reviewed  ECG    NA - Personally Reviewed  Physical Exam   GEN: No acute distress.   Neck: No  JVD Cardiac: RRR, no murmurs, rubs, or gallops.  Respiratory: Clear  to auscultation bilaterally. GI: Soft, nontender, non-distended  MS: No  edema; No deformity. Neuro:  Nonfocal  Psych: Normal affect   Labs    Chemistry Recent Labs Lab 01/13/17 1020 01/14/17 0755  01/17/17 0549 01/18/17 0541 01/19/17 0528    NA 139 141  < > 141 140 139  K 3.5 3.7  < > 3.7 3.6 3.5  CL 109 111  < > 111 113* 107  CO2 19* 19*  < > 21* 17* 19*  GLUCOSE 119* 101*  < > 177* 175* 160*  BUN 66* 54*  < > 38* 44* 50*  CREATININE 5.71* 4.91*  < > 4.27* 4.36* 4.02*  CALCIUM 9.0 8.6*  < > 8.4* 8.4* 8.5*  PROT 7.6 6.7  --   --   --   --   ALBUMIN 3.8 3.4*  --   --   --   --   AST 16 16  --   --   --   --   ALT 11* 10*  --   --   --   --   ALKPHOS 69 63  --   --   --   --   BILITOT 0.6 0.4  --   --   --   --  GFRNONAA 7* 8*  < > 9* 9* 10*  GFRAA 8* 9*  < > 11* 11* 12*  ANIONGAP 11 11  < > 9 10 13   < > = values in this interval not displayed.   Hematology Recent Labs Lab 01/14/17 0755 01/15/17 0533 01/19/17 0528  WBC 7.0 8.1 19.8*  RBC 3.22* 3.36* 3.38*  HGB 9.7* 10.1* 10.0*  HCT 28.6* 30.0* 29.2*  MCV 88.8 89.3 86.4  MCH 30.1 30.1 29.6  MCHC 33.9 33.7 34.2  RDW 12.8 13.0 13.2  PLT 285 301 282    Cardiac EnzymesNo results for input(s): TROPONINI in the last 168 hours. No results for input(s): TROPIPOC in the last 168 hours.   BNPNo results for input(s): BNP, PROBNP in the last 168 hours.   DDimer No results for input(s): DDIMER in the last 168 hours.   Radiology    US Biopsy  Result Date: 01/19/2017 INDICATION: Acute on chronic renal insufficiency. Please perform random renal biopsy for tissue diagnostic purposes. EXAM: ULTRASOUND GUIDED RENAL BIOPSY COMPARISON:  CT abdomen pelvis - 01/13/2017 MEDICATIONS: None. ANESTHESIA/SEDATION: Fentanyl 75 mcg IV; Versed 1.5 mg IV Total Moderate Sedation time: 14 minutes; The patient was continuously monitored during the procedure by the interventional radiology nurse under my direct supervision. COMPLICATIONS: None immediate. PROCEDURE: Informed written consent was obtained from the patient after a discussion of the risks, benefits and alternatives to treatment. The patient understands and consents the procedure. A timeout was performed prior to the initiation of  the procedure. Ultrasound scanning was performed of the bilateral flanks. The inferior pole of the left kidney was selected for biopsy due to location and sonographic window. The procedure was planned. The operative site was prepped and draped in the usual sterile fashion. The overlying soft tissues were anesthetized with 1% lidocaine with epinephrine. A 17 gauge core needle biopsy device was advanced into the inferior cortex of the left kidney and 2 core biopsies were obtained under direct ultrasound guidance. Real time pathologic review confirmed adequate tissue acquisition. Images were saved for documentation purposes. The biopsy device was removed and hemostasis was obtained with manual compression. Post procedural scanning was negative for significant post procedural hemorrhage or additional complication. A dressing was placed. The patient tolerated the procedure well without immediate post procedural complication. IMPRESSION: Technically successful ultrasound guided left renal biopsy. Electronically Signed   By: Sandi Mariscal M.D.   On: 01/19/2017 11:50    Cardiac Studies   Echo pending  Patient Profile     76 y.o. female with a hx of CAD s/p NSTEMI/PCI, CKD, hypertension, hyperlipidemia, chronic normocytic anemia OSA and pulmonary fibrosis who is being seenfor the evaluation of atrial fibrillation with RVRat the request of Dr. Florencia Reasons.  Assessment & Plan   ATRIAL FIB:  Tachybrady.  She will likely need a pacemaker in the future but this certainly can wait until her renal issues have been thoroughly evaluated and hopefully resolved.  Until then she has been switched to metoprolol tartrate to try to help with control of her tachybrady.  She will require antiocoagulation (Jody Ruiz has a CHA2DS2 - VASc score of 3.)  However, this again can start after we are clear on the course of therapy and need for no further invasive procedures regarding her renal disease.   Signed, Minus Breeding,  MD  01/19/2017, 2:18 PM

## 2017-01-19 NOTE — Progress Notes (Signed)
  Echocardiogram 2D Echocardiogram has been performed.  Jody Ruiz 01/19/2017, 3:10 PM

## 2017-01-19 NOTE — Progress Notes (Signed)
PROGRESS NOTE  Jody Ruiz TDV:[id number] DOB: [birth year] DOA: 01/13/2017 PCP: Burnard Bunting, MD  HPI/Recap of past 24 hours:   Had a better night  no fever, denies pain Denies GI symptoms   Assessment/Plan: Principal Problem:   Acute kidney injury superimposed on chronic kidney disease ([place]) Active Problems:   Hypertension   Obesity   IPF (idiopathic pulmonary fibrosis) ([place])   HLD (hyperlipidemia)   Microscopic colitis   Diarrhea with dehydration   Belching   Gastroesophageal reflux disease   H/O umbilical hernia repair   Dyspepsia   AKI (acute kidney injury) ([place])   Loose stools   History of Clostridium difficile colitis   Glomerulonephritis   SOB (shortness of breath)   Atrial fibrillation with RVR ([place])   Acute kidney injury superimposed on chronic kidney disease ([facility]) --Baseline creatinine 1.5. Cr  5.7 on admission. BUN also significantly elevated. -CT abdomen does not show any evidence of urinary obstruction. UA RBC TNTC, 6-30 wbc, she denies urinary symptom, she is reluctant to have abc treatment due to recent h/o cdiff -Avoid nephrotoxic medication.renal dosing meds, start sodium bicarb for metabolic acidosis, avoid hypotension. -cr has not improved as expected after two days of hydration, nephrology consulted. Possible glumerulonephritis ( she also has a h/o pulmonary fibrosis) -she is started on steroid therapy l by nephrology on 7/13, s/p renal biopsy by IR on Monday, will follow nephrology recommendations   New onset of afib/RVR, paroxysmal  On 714-7/15night Back to normal sinus rhythm spontaneously She report h/o osa, she does not want to use hospital cpap, she does not want to bring in her own due to fear of getting her own machine contaminated  Cardiology consulted  Chronic abdominal pain. Diarrhea, History of microscopic colitis. -She  Report chronic lower abdominal pain, Diarrhea with dehydration. And significant weight loss -she was  recently treated for cdiff, repeat cdiff testing on 7/5 was negative -she had an unremarkable ekg on 7/9 -gi consulted, recommend " consider MR-A to eval mesenteric vessels and exclude intestinal angina/vessel disease" , will need cr improvement for the studay - gi also recommend " Could consider flex sig for bx to exclude lymphocytic colitis   -will avoid ppi for now due to concerns of microscopic colitis and h/o cdiff,  -she has been on asa for cad, asa is also associated with colitis, I have discussed with gi, ok to continue asa for now. -on prn Bentyl when necessary at a lower body aches ,Avoid narcotics. -gi symptoms seems has improved.  -will follow Gi recommendation.  Right sided abdominal pain with scan drainage from her umbilicus:  Per GI" This may be related to her previous hernia surgery/mesh. Should follow-up with Dr. Hassell Done, surgery, as an outpatient in regards to this. It is chronic and not an acute inpatient issue, but she certainly may have chronic inflammation there causing these symptoms" Ct ab/pel: "Umbilical hernia repair with bulging of the mesh"   Essential hypertension.  titrate home bp med to avoid hypotension  In the setting of aki  Chronic anemia. normocytic hgb stable at 10 during this admission   H/o CAD s/p stents: on asa, toprolxl, imdur  Body mass index is 33.91 kg/m.   Anxiety: labile mood, cries at times, she does not likely anxiety to be mentioned.   DVT Prophylaxis: subcutaneous Heparin which patient refuses at times  Advance goals of care discussion: full code   Consults:  Gastroenterology  nephrology   Cardiology  Interventional radiology  Family Communication:  patient  Disposition: hopefully soon with nephrology/cardiology clearance   Procedures:  Renal biopsy by interventional radiology  on 7/16  Antibiotics:  none   Objective: BP (!) 144/78 (BP Location: Right Arm)   Pulse 63   Temp 98.2 F (36.8 C) (Oral)    Resp 18   Wt 98.2 kg (216 lb 7.9 oz)   LMP  (LMP Unknown)   SpO2 99%   BMI 33.91 kg/m   Intake/Output Summary (Last 24 hours) at 01/19/17 0815 Last data filed at 01/19/17 0801  Gross per 24 hour  Intake          2298.67 ml  Output             1150 ml  Net          1148.67 ml   Filed Weights   01/17/17 1041 01/18/17 0013 01/19/17 0539  Weight: 92.4 kg (203 lb 12.8 oz) 95.6 kg (210 lb 12.2 oz) 98.2 kg (216 lb 7.9 oz)    Exam:   General:  NAD,   Cardiovascular: RRR  Respiratory: CTABL  Abdomen: Soft, ND, nontencder , positive BS  Musculoskeletal: No Edema  Neuro: aaox3  Data Reviewed: Basic Metabolic Panel:  Recent Labs Lab 01/14/17 0755 01/15/17 0533 01/16/17 0601 01/17/17 0549 01/18/17 0541 01/18/17 1425 01/19/17 0528  NA 141 143 145 141 140  --  139  K 3.7 3.5 3.4* 3.7 3.6  --  3.5  CL 111 114* 116* 111 113*  --  107  CO2 19* 20* 21* 21* 17*  --  19*  GLUCOSE 101* 118* 100* 177* 175*  --  160*  BUN 54* 45* 38* 38* 44*  --  50*  CREATININE 4.91* 4.63* 4.31* 4.27* 4.36*  --  4.02*  CALCIUM 8.6* 8.7* 8.1* 8.4* 8.4*  --  8.5*  MG 1.7  --   --   --   --  1.4*  --    Liver Function Tests:  Recent Labs Lab 01/13/17 1020 01/14/17 0755  AST 16 16  ALT 11* 10*  ALKPHOS 69 63  BILITOT 0.6 0.4  PROT 7.6 6.7  ALBUMIN 3.8 3.4*    Recent Labs Lab 01/13/17 1020 01/14/17 0755  LIPASE 77* 87*   No results for input(s): AMMONIA in the last 168 hours. CBC:  Recent Labs Lab 01/13/17 1020 01/14/17 0755 01/15/17 0533 01/19/17 0528  WBC 8.2 7.0 8.1 19.8*  NEUTROABS  --  4.4 5.6 18.5*  HGB 10.6* 9.7* 10.1* 10.0*  HCT 31.1* 28.6* 30.0* 29.2*  MCV 87.1 88.8 89.3 86.4  PLT 310 285 301 282   Cardiac Enzymes:    Recent Labs Lab 01/13/17 1959  CKTOTAL 51   BNP (last 3 results)  Recent Labs  11/05/16 0755  BNP 65.3    ProBNP (last 3 results) No results for input(s): PROBNP in the last 8760 hours.  CBG: No results for input(s): GLUCAP in  the last 168 hours.  Recent Results (from the past 240 hour(s))  Urine culture     Status: Abnormal   Collection Time: 01/13/17 11:09 AM  Result Value Ref Range Status   Specimen Description URINE, CLEAN CATCH  Final   Special Requests NONE  Final   Culture (A)  Final    <10,000 COLONIES/mL INSIGNIFICANT GROWTH Performed at [facility], [address] [place], [place] [address]    Report Status 01/14/2017 FINAL  Final  C difficile quick scan w PCR reflex     Status:  None   Collection Time: 01/15/17  1:50 PM  Result Value Ref Range Status   C Diff antigen NEGATIVE NEGATIVE Final   C Diff toxin NEGATIVE NEGATIVE Final   C Diff interpretation No C. difficile detected.  Final     Studies: No results found.  Scheduled Meds: . amLODipine  5 mg Oral Daily  . aspirin EC  81 mg Oral Daily  . isosorbide mononitrate  60 mg Oral QHS  . metoprolol tartrate  25 mg Oral BID  . saccharomyces boulardii  250 mg Oral BID  . sodium bicarbonate  650 mg Oral Daily  . sucralfate  1 g Oral TID AC & HS    Continuous Infusions: . famotidine (PEPCID) IV Stopped (01/18/17 2350)  .  sodium bicarbonate (isotonic) infusion in sterile water 40 mL/hr at 01/18/17 1834     Time spent: 61mins  Lacheryl Niesen MD, PhD  [facility] Pager [phone]. If 7PM-7AM, please contact night-coverage at [email or web address], password [facility] 01/19/2017, 8:15 AM  LOS: 5 days

## 2017-01-19 NOTE — Progress Notes (Signed)
      Progress Note   Subjective  Patient states she is doing well from GI perspective. No diarrhea since Thursday. No nausea or abdominal pain bothering her. She went into AF with RVR over the weekend, cardiology consulted. She has plans for a renal biopsy today. She states she spontaneously converted to sinus rhythm last night.   Objective   Vital signs in last 24 hours: Temp:  [98.2 F (36.8 C)-98.7 F (37.1 C)] 98.2 F (36.8 C) (07/16 0539) Pulse Rate:  [63-76] 63 (07/16 0539) Resp:  [18] 18 (07/16 0539) BP: (144-161)/(64-78) 144/78 (07/16 0646) SpO2:  [98 %-99 %] 99 % (07/16 0539) Weight:  [216 lb 7.9 oz (98.2 kg)] 216 lb 7.9 oz (98.2 kg) (07/16 0539) Last BM Date: 01/15/17 General:    white female in NAD Heart:  Regular rate and rhythm; no murmurs Lungs: Respirations even and unlabored, lungs CTA bilaterally Abdomen:  Soft, nontender and nondistended. Normal bowel sounds. Neurologic:  Alert and oriented,  grossly normal neurologically. Psych:  Cooperative. Normal mood and affect.  Intake/Output from previous day: 07/15 0701 - 07/16 0700 In: 0459 [P.O.:720; I.V.:1000; IV Piggyback:50] Out: 1150 [Urine:1150] Intake/Output this shift: Total I/O In: 528.7 [I.V.:528.7] Out: -   Lab Results:  Recent Labs  01/19/17 0528  WBC 19.8*  HGB 10.0*  HCT 29.2*  PLT 282   BMET  Recent Labs  01/17/17 0549 01/18/17 0541 01/19/17 0528  NA 141 140 139  K 3.7 3.6 3.5  CL 111 113* 107  CO2 21* 17* 19*  GLUCOSE 177* 175* 160*  BUN 38* 44* 50*  CREATININE 4.27* 4.36* 4.02*  CALCIUM 8.4* 8.4* 8.5*   LFT No results for input(s): PROT, ALBUMIN, AST, ALT, ALKPHOS, BILITOT, BILIDIR, IBILI in the last 72 hours. PT/INR  Recent Labs  01/18/17 0541  LABPROT 14.3  INR 1.11    Studies/Results: No results found.     Assessment / Plan:   76 y/o female with history of microscopic colitis / C diff, dyspepsia / GERD, admitted with AKI - concern for underlying  glomerulonephritis for which she is undergoing a renal biopsy today. Also noted to have AF with RVR during this hospitalization.   Her GI symptoms are improved and stable at this time, I would continue pepcid twice daily and carafate for now, avoid PPI if possible given renal issues. If she does receive long term steroids for her renal disease, this would treat underlying microscopic colitis if active. Diarrhea not bothering her at present. Of note she does have some pain which may be related to inflammation at prior hernia repair site - she can follow up with Dr. Hassell Done regarding this issue once recovered from her acute issues.   We will follow peripherally for now, she can follow up with Dr. Henrene Pastor once outpatient. Please call with any questions / concerns in the interim.   Wexford Cellar, MD [facility] Pager [phone]

## 2017-01-19 NOTE — Procedures (Signed)
Pre Procedure Dx: Proteinuria Post Procedural Dx: Same  Technically successful US guided biopsy of inferior pole of the left kidney.  EBL: None  No immediate complications.   Jay Krystalynn Ridgeway, MD Pager #: [phone]   

## 2017-01-20 LAB — BASIC METABOLIC PANEL
Anion gap: 12 (ref 5–15)
BUN: 64 mg/dL — AB (ref 6–20)
CALCIUM: 8.4 mg/dL — AB (ref 8.9–10.3)
CO2: 23 mmol/L (ref 22–32)
CREATININE: 3.73 mg/dL — AB (ref 0.44–1.00)
Chloride: 103 mmol/L (ref 101–111)
GFR calc Af Amer: 13 mL/min — ABNORMAL LOW (ref >60)
GFR, EST NON AFRICAN AMERICAN: 11 mL/min — AB (ref >60)
Glucose, Bld: 169 mg/dL — ABNORMAL HIGH (ref 65–99)
POTASSIUM: 2.8 mmol/L — AB (ref 3.5–5.1)
SODIUM: 138 mmol/L (ref 135–145)

## 2017-01-20 LAB — CBC WITH DIFFERENTIAL/PLATELET
Basophils Absolute: 0 10*3/uL (ref 0.0–0.1)
Basophils Relative: 0 %
EOS ABS: 0 10*3/uL (ref 0.0–0.7)
EOS PCT: 0 %
HCT: 32 % — ABNORMAL LOW (ref 36.0–46.0)
Hemoglobin: 10.8 g/dL — ABNORMAL LOW (ref 12.0–15.0)
LYMPHS ABS: 1.9 10*3/uL (ref 0.7–4.0)
Lymphocytes Relative: 11 %
MCH: 29.6 pg (ref 26.0–34.0)
MCHC: 33.8 g/dL (ref 30.0–36.0)
MCV: 87.7 fL (ref 78.0–100.0)
Monocytes Absolute: 0.8 10*3/uL (ref 0.1–1.0)
Monocytes Relative: 5 %
Neutro Abs: 14.9 10*3/uL — ABNORMAL HIGH (ref 1.7–7.7)
Neutrophils Relative %: 84 %
PLATELETS: 328 10*3/uL (ref 150–400)
RBC: 3.65 MIL/uL — AB (ref 3.87–5.11)
RDW: 13.2 % (ref 11.5–15.5)
WBC: 17.6 10*3/uL — AB (ref 4.0–10.5)

## 2017-01-20 LAB — ANTINUCLEAR ANTIBODIES, IFA: ANA Ab, IFA: NEGATIVE

## 2017-01-20 MED ORDER — FAMOTIDINE 20 MG PO TABS
20.0000 mg | ORAL_TABLET | Freq: Two times a day (BID) | ORAL | Status: DC
  Administered 2017-01-20 – 2017-01-22 (×4): 20 mg via ORAL
  Filled 2017-01-20 (×4): qty 1

## 2017-01-20 MED ORDER — CYCLOPHOSPHAMIDE 50 MG PO CAPS
100.0000 mg | ORAL_CAPSULE | Freq: Every day | ORAL | Status: DC
  Administered 2017-01-20: 100 mg via ORAL
  Filled 2017-01-20 (×2): qty 2

## 2017-01-20 MED ORDER — POTASSIUM CHLORIDE CRYS ER 20 MEQ PO TBCR
40.0000 meq | EXTENDED_RELEASE_TABLET | ORAL | Status: AC
  Administered 2017-01-20 (×2): 40 meq via ORAL
  Filled 2017-01-20 (×2): qty 2

## 2017-01-20 MED ORDER — HEPARIN SODIUM (PORCINE) 5000 UNIT/ML IJ SOLN
5000.0000 [IU] | Freq: Three times a day (TID) | INTRAMUSCULAR | Status: DC
  Filled 2017-01-20: qty 1

## 2017-01-20 MED ORDER — METOPROLOL TARTRATE 5 MG/5ML IV SOLN
5.0000 mg | Freq: Once | INTRAVENOUS | Status: AC
  Administered 2017-01-21: 5 mg via INTRAVENOUS
  Filled 2017-01-20: qty 5

## 2017-01-20 NOTE — Progress Notes (Signed)
Pt's heart rate is running from 90's-140's. md notified. Instructed to push 5mg  IV metoprolol. Will continue to monitor at this time.

## 2017-01-20 NOTE — Progress Notes (Signed)
[facility] Progress Note  Subjective: sitting up, eating lunch.  Feeling well.    Vitals:   01/19/17 1643 01/19/17 2000 01/20/17 0412 01/20/17 0500  BP: (!) 150/72 (!) 152/77 (!) 153/75   Pulse:  72 67   Resp:  18 16   Temp:  98.6 F (37 C) 98.2 F (36.8 C)   TempSrc:  Oral Oral   SpO2:  97% 98%   Weight:    95.3 kg (210 lb 1.6 oz)    Inpatient medications: . amLODipine  5 mg Oral Daily  . aspirin EC  81 mg Oral Daily  . cyclophosphamide  100 mg Oral Daily  . heparin subcutaneous  5,000 Units Subcutaneous Q8H  . isosorbide mononitrate  60 mg Oral QHS  . metoprolol tartrate  25 mg Oral BID  . predniSONE  60 mg Oral Q breakfast  . saccharomyces boulardii  250 mg Oral BID  . sodium bicarbonate  650 mg Oral Daily  . sucralfate  1 g Oral TID AC & HS   . famotidine (PEPCID) IV Stopped (01/20/17 1106)   acetaminophen **OR** acetaminophen, dicyclomine, hydrALAZINE, ondansetron **OR** ondansetron (ZOFRAN) IV  Exam: GEN older adult, WDWN , calm HEENT EOMI, sclerae anicteric NECK no JVD PULM clear bilaterally  CV RRR  ABD nontender EXT no LE edema NEURO nonfocal  Urine sediment (by Dr Jonnie Finner at time of initial c/s) > 50-100 rbc's /hpf, +dysmorphic RBC's, +gran casts , some degen cellular casts , WBC 10-25 per hpf  Assessment/ Plan: 1   Renal failure - creat 0.9 last year, up to 1.5 in March, creatinine improving and is in the low 4s. 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  S/p renal biopsy.  S/p pulse dose steroids, on pred 60.  Expect prelim bx result to be back this evening or tomorrow; starting PO cytoxan 100 mg daily.  2  HTN - BP a little higher, on norvasc/ toprol 3  AFib/ RVR - cardiology seeing, on BB for now; had a small run of Afib last night.  Per cardiology; want to start warfarin.  Would wait 48-72 hours after biopsy to start. 4  CAD hx stents x 2 5 Pulm fibrosis - mild, not on O2 or immunosuppression, seen by Dr  Elsworth Soho and also at [facility]; no specific dx noted in the records 6  Hx microscopic colitis- followed by Dr. Hilarie Fredrickson. 7.  Dispo: pt desires to wait for prelim result and to see how she tolerates first dose of PO cyclophosphamide.      Madelon Lips MD [facility] pgr [phone]  01/20/2017, 1:37 PM    Recent Labs Lab 01/18/17 0541 01/19/17 0528 01/20/17 0852  NA 140 139 138  K 3.6 3.5 2.8*  CL 113* 107 103  CO2 17* 19* 23  GLUCOSE 175* 160* 169*  BUN 44* 50* 64*  CREATININE 4.36* 4.02* 3.73*  CALCIUM 8.4* 8.5* 8.4*    Recent Labs Lab 01/14/17 0755  AST 16  ALT 10*  ALKPHOS 63  BILITOT 0.4  PROT 6.7  ALBUMIN 3.4*    Recent Labs Lab 01/15/17 0533 01/19/17 0528 01/20/17 0852  WBC 8.1 19.8* 17.6*  NEUTROABS 5.6 18.5* 14.9*  HGB 10.1* 10.0* 10.8*  HCT 30.0* 29.2* 32.0*  MCV 89.3 86.4 87.7  PLT 301 282 328   Iron/TIBC/Ferritin/ %Sat No results found for: IRON, TIBC, FERRITIN, IRONPCTSAT

## 2017-01-20 NOTE — Progress Notes (Signed)
Tele called stating pt converted from SR to a-fib. Pt's heart rate is irregular upon auscultation. Heart rate jumping from 70-110's. MD notified. Instructed to call if pt's heart rate maintains elevated. Will continue to monitor at this time.

## 2017-01-20 NOTE — Progress Notes (Addendum)
PROGRESS NOTE  Jody Ruiz [name]:[id number] DOB: [birth year] DOA: 01/13/2017 PCP: Burnard Bunting, MD  Brief summary:   H/o miscroscopic colitis, pulmonary fibrosis, presented with acute on Chronic abdominal pain,diarrhea,  poor oral intake, found to have ARF, s/p renal biopsy, awaiting for pathology result, treating for possible glumerulonephritis,  GI signed off, nerphology consulted. She also has PAF, cardiology consulted.    HPI/Recap of past 24 hours:    no fever, denies pain, had formed stool this am,  Had paroxysmal afib on tele    Assessment/Plan: Principal Problem:   Acute kidney injury superimposed on chronic kidney disease (Jody Ruiz) Active Problems:   Hypertension   Obesity   IPF (idiopathic pulmonary fibrosis) (HCC)   HLD (hyperlipidemia)   Microscopic colitis   Diarrhea with dehydration   Belching   Gastroesophageal reflux disease   H/O umbilical hernia repair   Dyspepsia   AKI (acute kidney injury) (Jody Ruiz)   Loose stools   History of Clostridium difficile colitis   Glomerulonephritis   SOB (shortness of breath)   Atrial fibrillation with RVR (HCC)   Acute kidney injury superimposed on chronic kidney disease (HCC) --Baseline creatinine 1.5. Cr  5.7 on admission. BUN also significantly elevated. -CT abdomen does not show any evidence of urinary obstruction. UA RBC TNTC, 6-30 wbc, she denies urinary symptom,  -Avoid nephrotoxic medication.renal dosing meds, start sodium bicarb for metabolic acidosis, avoid hypotension. -cr has not improved as expected after two days of hydration, nephrology consulted. Possible glumerulonephritis ( she also has a h/o pulmonary fibrosis) -she is started on steroid therapy by nephrology on 7/13, s/p renal biopsy by IR on Monday, will follow nephrology recommendations   New onset of afib/RVR, paroxysmal  On 714-7/15night Back to normal sinus rhythm spontaneously She report h/o osa, she does not want to use hospital cpap, she  does not want to bring in her own due to fear of getting her own machine contaminated  Cardiology consulted  Hypokalemia/hypomagnesemia: replace k, mag, keep k>4, mag >2.   Chronic abdominal pain. Diarrhea, History of microscopic colitis. -She  Report chronic lower abdominal pain, Diarrhea with dehydration. And significant weight loss -she was recently treated for cdiff, repeat cdiff testing on 7/5 was negative -she had an unremarkable ekg on 7/9 -gi consulted, recommend " consider MR-A to eval mesenteric vessels and exclude intestinal angina/vessel disease" ,  " Could consider flex sig for bx to exclude lymphocytic colitis   -will avoid ppi for now due to concerns of microscopic colitis and h/o cdiff,  -she has been on asa for cad, asa is also associated with colitis, I have discussed with gi, ok to continue asa for now. -on prn Bentyl when necessary at a lower body aches ,Avoid narcotics. -gi symptoms has improved. Initially planned gi imaging and scope on hold due to improving of gi symptom and now focus on renal and cardiac issues -f/u GI after discharge.  Right sided abdominal pain with scan drainage from her umbilicus:  Ct ab/pel: "Umbilical hernia repair with bulging of the mesh" Per GI" right sided pain may be related to her previous hernia surgery/mesh. Should follow-up with Dr. Hassell Done, surgery, as an outpatient in regards to this. It is chronic and not an acute inpatient issue, but she certainly may have chronic inflammation there causing these symptoms"    Essential hypertension.  titrate home bp med to avoid hypotension  In the setting of aki  Chronic anemia. normocytic hgb stable at 10 during this admission  H/o CAD s/p stents: on asa, toprolxl, imdur  Body mass index is 32.91 kg/m.   Anxiety: labile mood, cries at times, she does not likely anxiety to be mentioned.   DVT Prophylaxis: subcutaneous Heparin which patient refuses at times  Advance goals of  care discussion: full code   Consults:  Gastroenterology  nephrology   Cardiology  Interventional radiology  Family Communication: patient  Disposition: hopefully soon with nephrology/cardiology clearance   Procedures:  Renal biopsy by interventional radiology  on 7/16  Antibiotics:  none   Objective: BP (!) 145/68 (BP Location: Right Arm)   Pulse 64   Temp 98.4 F (36.9 C) (Oral)   Resp 18   Wt 95.3 kg (210 lb 1.6 oz)   LMP  (LMP Unknown)   SpO2 97%   BMI 32.91 kg/m   Intake/Output Summary (Last 24 hours) at 01/20/17 1925 Last data filed at 01/20/17 1730  Gross per 24 hour  Intake             1180 ml  Output             1900 ml  Net             -720 ml   Filed Weights   01/18/17 0013 01/19/17 0539 01/20/17 0500  Weight: 95.6 kg (210 lb 12.2 oz) 98.2 kg (216 lb 7.9 oz) 95.3 kg (210 lb 1.6 oz)    Exam:   General:  NAD,   Cardiovascular: RRR  Respiratory: CTABL  Abdomen: Soft, ND, nontender , positive BS  Musculoskeletal: No Edema  Neuro: aaox3  Data Reviewed: Basic Metabolic Panel:  Recent Labs Lab 01/14/17 0755  01/16/17 0601 01/17/17 0549 01/18/17 0541 01/18/17 1425 01/19/17 0528 01/20/17 0852  NA 141  < > 145 141 140  --  139 138  K 3.7  < > 3.4* 3.7 3.6  --  3.5 2.8*  CL 111  < > 116* 111 113*  --  107 103  CO2 19*  < > 21* 21* 17*  --  19* 23  GLUCOSE 101*  < > 100* 177* 175*  --  160* 169*  BUN 54*  < > 38* 38* 44*  --  50* 64*  CREATININE 4.91*  < > 4.31* 4.27* 4.36*  --  4.02* 3.73*  CALCIUM 8.6*  < > 8.1* 8.4* 8.4*  --  8.5* 8.4*  MG 1.7  --   --   --   --  1.4*  --   --   < > = values in this interval not displayed. Liver Function Tests:  Recent Labs Lab 01/14/17 0755  AST 16  ALT 10*  ALKPHOS 63  BILITOT 0.4  PROT 6.7  ALBUMIN 3.4*    Recent Labs Lab 01/14/17 0755  LIPASE 87*   No results for input(s): AMMONIA in the last 168 hours. CBC:  Recent Labs Lab 01/14/17 0755 01/15/17 0533 01/19/17 0528  01/20/17 0852  WBC 7.0 8.1 19.8* 17.6*  NEUTROABS 4.4 5.6 18.5* 14.9*  HGB 9.7* 10.1* 10.0* 10.8*  HCT 28.6* 30.0* 29.2* 32.0*  MCV 88.8 89.3 86.4 87.7  PLT 285 301 282 328   Cardiac Enzymes:    Recent Labs Lab 01/13/17 1959  CKTOTAL 51   BNP (last 3 results)  Recent Labs  11/05/16 0755  BNP 65.3    ProBNP (last 3 results) No results for input(s): PROBNP in the last 8760 hours.  CBG: No results for input(s): GLUCAP in the last 168  hours.  Recent Results (from the past 240 hour(s))  Urine culture     Status: Abnormal   Collection Time: 01/13/17 11:09 AM  Result Value Ref Range Status   Specimen Description URINE, CLEAN CATCH  Final   Special Requests NONE  Final   Culture (A)  Final    <10,000 COLONIES/mL INSIGNIFICANT GROWTH Performed at [facility], [address] [place], [place] [address]    Report Status 01/14/2017 FINAL  Final  C difficile quick scan w PCR reflex     Status: None   Collection Time: 01/15/17  1:50 PM  Result Value Ref Range Status   C Diff antigen NEGATIVE NEGATIVE Final   C Diff toxin NEGATIVE NEGATIVE Final   C Diff interpretation No C. difficile detected.  Final     Studies: No results found.  Scheduled Meds: . amLODipine  5 mg Oral Daily  . aspirin EC  81 mg Oral Daily  . cyclophosphamide  100 mg Oral Daily  . heparin subcutaneous  5,000 Units Subcutaneous Q8H  . isosorbide mononitrate  60 mg Oral QHS  . metoprolol tartrate  25 mg Oral BID  . potassium chloride  40 mEq Oral Q4H  . predniSONE  60 mg Oral Q breakfast  . saccharomyces boulardii  250 mg Oral BID  . sodium bicarbonate  650 mg Oral Daily  . sucralfate  1 g Oral TID AC & HS    Continuous Infusions: . famotidine (PEPCID) IV Stopped (01/20/17 1106)     Time spent: 46mins  Belky Mundo MD, PhD  [facility] Pager [phone]. If 7PM-7AM, please contact night-coverage at [email or web address], password [facility] 01/20/2017, 7:25 PM  LOS: 6 days

## 2017-01-20 NOTE — Progress Notes (Signed)
MEDICATION-RELATED CONSULT NOTE   IR Procedure Consult - Anticoagulant/Antiplatelet PTA/Inpatient Med List Review by Pharmacist    Procedure:  US guided biopsy of inferior pole of the left kidney.    Completed: 7/16  Post-Procedural bleeding risk per IR MD assessment:  high  Antithrombotic medications on inpatient or PTA profile prior to procedure:     heparin 5000 units SQ q8h  Recommended restart time per IR Post-Procedure Guidelines:   Day 0 at least 8 hours or at next standard dose interval  Other considerations:      Plan:     Heparin 5000 units SQ q8h> of note pt refused all previously ordered doses   Dolly Rias RPh 01/20/2017, 1:56 AM Pager [phone]

## 2017-01-20 NOTE — Progress Notes (Signed)
Progress Note  Patient Name: Jody Ruiz Date of Encounter: 01/20/2017  Primary Cardiologist: Dr. Martinique  Subjective   No further episodes of tachybrady noted to telemetry. Brief episode of atrial fibrillation last night  Inpatient Medications    Scheduled Meds: . amLODipine  5 mg Oral Daily  . aspirin EC  81 mg Oral Daily  . heparin subcutaneous  5,000 Units Subcutaneous Q8H  . isosorbide mononitrate  60 mg Oral QHS  . metoprolol tartrate  25 mg Oral BID  . predniSONE  60 mg Oral Q breakfast  . saccharomyces boulardii  250 mg Oral BID  . sodium bicarbonate  650 mg Oral Daily  . sucralfate  1 g Oral TID AC & HS   Continuous Infusions: . famotidine (PEPCID) IV Stopped (01/19/17 2213)  .  sodium bicarbonate (isotonic) infusion in sterile water 40 mL/hr at 01/19/17 2109   PRN Meds: acetaminophen **OR** acetaminophen, dicyclomine, hydrALAZINE, ondansetron **OR** ondansetron (ZOFRAN) IV   Vital Signs    Vitals:   01/19/17 1643 01/19/17 2000 01/20/17 0412 01/20/17 0500  BP: (!) 150/72 (!) 152/77 (!) 153/75   Pulse:  72 67   Resp:  18 16   Temp:  98.6 F (37 C) 98.2 F (36.8 C)   TempSrc:  Oral Oral   SpO2:  97% 98%   Weight:    210 lb 1.6 oz (95.3 kg)    Intake/Output Summary (Last 24 hours) at 01/20/17 0933 Last data filed at 01/20/17 0835  Gross per 24 hour  Intake             1872 ml  Output             1700 ml  Net              172 ml   Filed Weights   01/18/17 0013 01/19/17 0539 01/20/17 0500  Weight: 210 lb 12.2 oz (95.6 kg) 216 lb 7.9 oz (98.2 kg) 210 lb 1.6 oz (95.3 kg)    Telemetry    NSR, had an episode of rate controlled afib last night- Personally Reviewed   Physical Exam   GEN: Well nourished, well developed HEENT: normal  Neck: no JVD, carotid bruits, or masses Cardiac: RRR. no murmurs, rubs, or gallops,no edema. Intact distal pulses bilaterally.  Respiratory: clear to auscultation bilaterally, normal work of breathing GI: soft,  nontender, nondistended, + BS MS: no deformity or atrophy  Skin: warm and dry, no rash Neuro: Alert and Oriented x 3, Strength and sensation are intact Psych:   Full affect  Labs    Chemistry Recent Labs Lab 01/13/17 1020 01/14/17 0755  01/17/17 0549 01/18/17 0541 01/19/17 0528  NA 139 141  < > 141 140 139  K 3.5 3.7  < > 3.7 3.6 3.5  CL 109 111  < > 111 113* 107  CO2 19* 19*  < > 21* 17* 19*  GLUCOSE 119* 101*  < > 177* 175* 160*  BUN 66* 54*  < > 38* 44* 50*  CREATININE 5.71* 4.91*  < > 4.27* 4.36* 4.02*  CALCIUM 9.0 8.6*  < > 8.4* 8.4* 8.5*  PROT 7.6 6.7  --   --   --   --   ALBUMIN 3.8 3.4*  --   --   --   --   AST 16 16  --   --   --   --   ALT 11* 10*  --   --   --   --  ALKPHOS 69 63  --   --   --   --   BILITOT 0.6 0.4  --   --   --   --   GFRNONAA 7* 8*  < > 9* 9* 10*  GFRAA 8* 9*  < > 11* 11* 12*  ANIONGAP 11 11  < > 9 10 13   < > = values in this interval not displayed.   Hematology Recent Labs Lab 01/14/17 0755 01/15/17 0533 01/19/17 0528  WBC 7.0 8.1 19.8*  RBC 3.22* 3.36* 3.38*  HGB 9.7* 10.1* 10.0*  HCT 28.6* 30.0* 29.2*  MCV 88.8 89.3 86.4  MCH 30.1 30.1 29.6  MCHC 33.9 33.7 34.2  RDW 12.8 13.0 13.2  PLT 285 301 282    Radiology    US Biopsy  Result Date: 01/19/2017 INDICATION: Acute on chronic renal insufficiency. Please perform random renal biopsy for tissue diagnostic purposes. EXAM: ULTRASOUND GUIDED RENAL BIOPSY COMPARISON:  CT abdomen pelvis - 01/13/2017 MEDICATIONS: None. ANESTHESIA/SEDATION: Fentanyl 75 mcg IV; Versed 1.5 mg IV Total Moderate Sedation time: 14 minutes; The patient was continuously monitored during the procedure by the interventional radiology nurse under my direct supervision. COMPLICATIONS: None immediate. PROCEDURE: Informed written consent was obtained from the patient after a discussion of the risks, benefits and alternatives to treatment. The patient understands and consents the procedure. A timeout was performed  prior to the initiation of the procedure. Ultrasound scanning was performed of the bilateral flanks. The inferior pole of the left kidney was selected for biopsy due to location and sonographic window. The procedure was planned. The operative site was prepped and draped in the usual sterile fashion. The overlying soft tissues were anesthetized with 1% lidocaine with epinephrine. A 17 gauge core needle biopsy device was advanced into the inferior cortex of the left kidney and 2 core biopsies were obtained under direct ultrasound guidance. Real time pathologic review confirmed adequate tissue acquisition. Images were saved for documentation purposes. The biopsy device was removed and hemostasis was obtained with manual compression. Post procedural scanning was negative for significant post procedural hemorrhage or additional complication. A dressing was placed. The patient tolerated the procedure well without immediate post procedural complication. IMPRESSION: Technically successful ultrasound guided left renal biopsy. Electronically Signed   By: Sandi Mariscal M.D.   On: 01/19/2017 11:50    Cardiac Studies   Echocardiogram 01/20/2017  Study Conclusions  - Left ventricle: The cavity size was normal. Systolic function was   normal. The estimated ejection fraction was in the range of 60%   to 65%. Wall motion was normal; there were no regional wall   motion abnormalities. There was an increased relative   contribution of atrial contraction to ventricular filling.   Doppler parameters are consistent with abnormal left ventricular   relaxation (grade 1 diastolic dysfunction). - Mitral valve: Calcified annulus. There was mild regurgitation. - Pulmonary arteries: Systolic pressure could not be accurately   estimated  Patient Profile     76 y.o. female with a hx of CAD s/p NSTEMI/PCI, CKD, hypertension, hyperlipidemia, chronic normocytic anemia OSA and pulmonary fibrosis who is being seenfor the evaluation  of atrial fibrillation with RVRat the request of Dr. Florencia Reasons.  Assessment & Plan     Atrial Fibrillation:  Tachybrady. Heart rate appears to be more stable on the metoprolol tartrate. She will likely need pacemaker in future but this will need to be at a later time when her renal concerns have been thoroughly addressed. She will  require anticoagulation with able (Jody Ruiz has a CHA2DS2 - VASc score of 3) after no further invasive procedures are anticipated.   Her echocardiogram yesterday results revealed EF 60-65%, no wall motion abnormalities. Grade II DD. Did have an episode of a.fib last night. Renal biopsy result is still pending.    Kristopher Glee, PA-C  01/20/2017, 9:33 AM    History and all data above reviewed.  Patient examined.  I agree with the findings as above.  She does not feel the fib when she is in it.  Rate was controlled. The patient exam reveals COR:RRR  ,  Lungs: Clear  ,  Abd: Positive bowel sounds, no rebound no guarding, Ext No edema  .  All available labs, radiology testing, previous records reviewed. Agree with documented assessment and plan.  Atrial fib:  Brief run yesterday.  She will need warfarin once we are sure no further invasive evaluation is indicated.  Of note she might want to start this as an out patient with Dr. Martinique.  HTN:  Her BP is up but her Norvasc dose was just increased yesterday.  Follow    Minus Breeding  12:32 PM  01/20/2017

## 2017-01-20 NOTE — Care Management Important Message (Signed)
Important Message  Patient Details  Name: SHAKITA KEIR MRN: [id number] Date of Birth: [birth year]   Medicare Important Message Given:  Yes    Kerin Salen 01/20/2017, 10:55 AMImportant Message  Patient Details  Name: SHANITRA PHILLIPPI MRN: [id number] Date of Birth: 09/26/1940   Medicare Important Message Given:  Yes    Kerin Salen 01/20/2017, 10:55 AM

## 2017-01-21 LAB — CBC WITH DIFFERENTIAL/PLATELET
BASOS ABS: 0 10*3/uL (ref 0.0–0.1)
Basophils Relative: 0 %
EOS ABS: 0 10*3/uL (ref 0.0–0.7)
Eosinophils Relative: 0 %
HCT: 29.2 % — ABNORMAL LOW (ref 36.0–46.0)
HEMOGLOBIN: 9.8 g/dL — AB (ref 12.0–15.0)
LYMPHS PCT: 16 %
Lymphs Abs: 2.1 10*3/uL (ref 0.7–4.0)
MCH: 29.7 pg (ref 26.0–34.0)
MCHC: 33.6 g/dL (ref 30.0–36.0)
MCV: 88.5 fL (ref 78.0–100.0)
Monocytes Absolute: 0.9 10*3/uL (ref 0.1–1.0)
Monocytes Relative: 7 %
NEUTROS PCT: 77 %
Neutro Abs: 10.4 10*3/uL — ABNORMAL HIGH (ref 1.7–7.7)
Platelets: 261 10*3/uL (ref 150–400)
RBC: 3.3 MIL/uL — AB (ref 3.87–5.11)
RDW: 13.3 % (ref 11.5–15.5)
WBC: 13.4 10*3/uL — AB (ref 4.0–10.5)

## 2017-01-21 LAB — BASIC METABOLIC PANEL
ANION GAP: 9 (ref 5–15)
BUN: 69 mg/dL — ABNORMAL HIGH (ref 6–20)
CHLORIDE: 105 mmol/L (ref 101–111)
CO2: 26 mmol/L (ref 22–32)
Calcium: 8 mg/dL — ABNORMAL LOW (ref 8.9–10.3)
Creatinine, Ser: 3.65 mg/dL — ABNORMAL HIGH (ref 0.44–1.00)
GFR calc Af Amer: 13 mL/min — ABNORMAL LOW (ref >60)
GFR calc non Af Amer: 11 mL/min — ABNORMAL LOW (ref >60)
Glucose, Bld: 168 mg/dL — ABNORMAL HIGH (ref 65–99)
POTASSIUM: 3.5 mmol/L (ref 3.5–5.1)
SODIUM: 140 mmol/L (ref 135–145)

## 2017-01-21 LAB — ALBUMIN: ALBUMIN: 2.7 g/dL — AB (ref 3.5–5.0)

## 2017-01-21 LAB — MAGNESIUM: MAGNESIUM: 1.5 mg/dL — AB (ref 1.7–2.4)

## 2017-01-21 MED ORDER — CYCLOPHOSPHAMIDE 25 MG PO CAPS
75.0000 mg | ORAL_CAPSULE | Freq: Every day | ORAL | Status: DC
  Filled 2017-01-21 (×2): qty 3

## 2017-01-21 MED ORDER — CYCLOPHOSPHAMIDE 25 MG PO CAPS
75.0000 mg | ORAL_CAPSULE | Freq: Every day | ORAL | Status: DC
  Administered 2017-01-21 – 2017-01-22 (×2): 75 mg via ORAL
  Filled 2017-01-21 (×3): qty 3

## 2017-01-21 MED ORDER — METOPROLOL TARTRATE 5 MG/5ML IV SOLN
5.0000 mg | Freq: Once | INTRAVENOUS | Status: AC
  Administered 2017-01-21: 5 mg via INTRAVENOUS
  Filled 2017-01-21: qty 5

## 2017-01-21 MED ORDER — CYCLOPHOSPHAMIDE 50 MG PO CAPS
50.0000 mg | ORAL_CAPSULE | ORAL | Status: DC
  Filled 2017-01-21: qty 1

## 2017-01-21 MED ORDER — DICYCLOMINE HCL 10 MG PO CAPS: 10.0000 mg | ORAL_CAPSULE | Freq: Three times a day (TID) | ORAL | 0 refills | Status: AC | PRN

## 2017-01-21 MED ORDER — SUCRALFATE 1 GM/10ML PO SUSP
1.0000 g | Freq: Two times a day (BID) | ORAL | Status: DC
  Filled 2017-01-21: qty 10

## 2017-01-21 MED ORDER — MAGNESIUM OXIDE 400 (241.3 MG) MG PO TABS
400.0000 mg | ORAL_TABLET | Freq: Two times a day (BID) | ORAL | Status: DC
  Administered 2017-01-21 – 2017-01-22 (×3): 400 mg via ORAL
  Filled 2017-01-21 (×2): qty 1

## 2017-01-21 MED ORDER — SUCRALFATE 1 GM/10ML PO SUSP: 1.0000 g | Freq: Two times a day (BID) | ORAL | 0 refills | Status: DC

## 2017-01-21 MED ORDER — CYCLOPHOSPHAMIDE 25 MG PO CAPS: 75.0000 mg | ORAL_CAPSULE | Freq: Every day | ORAL | Status: DC

## 2017-01-21 MED ORDER — SODIUM CHLORIDE 0.9 % IV BOLUS (SEPSIS)
250.0000 mL | Freq: Once | INTRAVENOUS | Status: AC
  Administered 2017-01-21: 250 mL via INTRAVENOUS

## 2017-01-21 MED ORDER — CYCLOPHOSPHAMIDE 25 MG PO CAPS: 75.0000 mg | ORAL_CAPSULE | Freq: Every day | ORAL | 0 refills | Status: DC

## 2017-01-21 MED ORDER — MAGNESIUM SULFATE 2 GM/50ML IV SOLN
2.0000 g | Freq: Once | INTRAVENOUS | Status: AC
  Administered 2017-01-21: 2 g via INTRAVENOUS
  Filled 2017-01-21: qty 50

## 2017-01-21 MED ORDER — SACCHAROMYCES BOULARDII 250 MG PO CAPS: 250.0000 mg | ORAL_CAPSULE | Freq: Two times a day (BID) | ORAL | 0 refills | Status: DC

## 2017-01-21 MED ORDER — MAGNESIUM OXIDE 400 (241.3 MG) MG PO TABS: 400.0000 mg | ORAL_TABLET | Freq: Two times a day (BID) | ORAL | 0 refills | Status: DC

## 2017-01-21 MED ORDER — FAMOTIDINE 20 MG PO TABS: 20.0000 mg | ORAL_TABLET | Freq: Two times a day (BID) | ORAL | 0 refills | Status: AC

## 2017-01-21 MED ORDER — PREDNISONE 20 MG PO TABS: 60.0000 mg | ORAL_TABLET | Freq: Every day | ORAL | 0 refills | Status: DC

## 2017-01-21 NOTE — Progress Notes (Signed)
Pt's HR remains uncontrolled, ranging anywhere from 90-160's. Pt is resting in bed, but states she does feel dizzy and complains of nausea. Pt also started complaining of what she refers to as a rash on her right forearm/wrist area. Area of skin is reddened, has been marked with skin marker. MD notified of all changes. Ordered to given 250 cc bolus of normal saline, obtain an EKG, and administered 5 mg of IV metoprolol once bolus finishes. Placed on 2L O2 and given IV zofran for nausea.

## 2017-01-21 NOTE — Progress Notes (Signed)
Progress Note  Patient Name: Jody Ruiz Date of Encounter: 01/21/2017  Primary Cardiologist: Dr. Martinique  Subjective   Per the nurse reports the patients episodes of atrial flutter with RVR, rates up to 160 with associated nausea and dizziness. A fluid bolus and 5mg  IV Metoprolol x 2 given, currently rate controlled.   She is on Norvasc 5 mg, Imdur 60mg  and Lopressor tartrate 25 mg BID   Inpatient Medications    Scheduled Meds: . amLODipine  5 mg Oral Daily  . aspirin EC  81 mg Oral Daily  . cyclophosphamide  100 mg Oral Daily  . famotidine  20 mg Oral BID  . heparin subcutaneous  5,000 Units Subcutaneous Q8H  . isosorbide mononitrate  60 mg Oral QHS  . metoprolol tartrate  25 mg Oral BID  . predniSONE  60 mg Oral Q breakfast  . saccharomyces boulardii  250 mg Oral BID  . sodium bicarbonate  650 mg Oral Daily  . sucralfate  1 g Oral TID AC & HS   Continuous Infusions:  PRN Meds: acetaminophen **OR** acetaminophen, dicyclomine, hydrALAZINE, ondansetron **OR** ondansetron (ZOFRAN) IV   Vital Signs    Vitals:   01/20/17 1500 01/20/17 2037 01/20/17 2325 01/21/17 0538  BP: (!) 145/68 (!) 167/80 134/68 (!) 153/75  Pulse: 64 68 97 72  Resp: 18 18  18   Temp: 98.4 F (36.9 C) 98.5 F (36.9 C)  98.3 F (36.8 C)  TempSrc: Oral Oral  Oral  SpO2: 97% 99% 99% 100%  Weight:    207 lb 7.3 oz (94.1 kg)    Intake/Output Summary (Last 24 hours) at 01/21/17 0738 Last data filed at 01/21/17 0645  Gross per 24 hour  Intake              900 ml  Output             1000 ml  Net             -100 ml   Filed Weights   01/19/17 0539 01/20/17 0500 01/21/17 0538  Weight: 216 lb 7.9 oz (98.2 kg) 210 lb 1.6 oz (95.3 kg) 207 lb 7.3 oz (94.1 kg)    Telemetry    Atrial flutter, rates 60-130sr     - Personally Reviewed   Physical Exam   GEN: Well nourished, well developed HEENT: normal  Neck: no JVD, carotid bruits, or masses Cardiac: RRR. no murmurs, rubs, or gallops,no  edema. Intact distal pulses bilaterally.  Respiratory: clear to auscultation bilaterally, normal work of breathing GI: soft, nontender, nondistended, + BS Jody: no deformity or atrophy  Skin: warm and dry, no rash Neuro: Alert and Oriented x 3, Strength and sensation are intact Psych:   Full affect  Labs    Chemistry Recent Labs Lab 01/14/17 0755  01/18/17 0541 01/19/17 0528 01/20/17 0852  NA 141  < > 140 139 138  K 3.7  < > 3.6 3.5 2.8*  CL 111  < > 113* 107 103  CO2 19*  < > 17* 19* 23  GLUCOSE 101*  < > 175* 160* 169*  BUN 54*  < > 44* 50* 64*  CREATININE 4.91*  < > 4.36* 4.02* 3.73*  CALCIUM 8.6*  < > 8.4* 8.5* 8.4*  PROT 6.7  --   --   --   --   ALBUMIN 3.4*  --   --   --   --   AST 16  --   --   --   --  ALT 10*  --   --   --   --   ALKPHOS 63  --   --   --   --   BILITOT 0.4  --   --   --   --   GFRNONAA 8*  < > 9* 10* 11*  GFRAA 9*  < > 11* 12* 13*  ANIONGAP 11  < > 10 13 12   < > = values in this interval not displayed.   Hematology Recent Labs Lab 01/19/17 0528 01/20/17 0852 01/21/17 0653  WBC 19.8* 17.6* 13.4*  RBC 3.38* 3.65* 3.30*  HGB 10.0* 10.8* 9.8*  HCT 29.2* 32.0* 29.2*  MCV 86.4 87.7 88.5  MCH 29.6 29.6 29.7  MCHC 34.2 33.8 33.6  RDW 13.2 13.2 13.3  PLT 282 328 261    Cardiac EnzymesNo results for input(s): TROPONINI in the last 168 hours. No results for input(s): TROPIPOC in the last 168 hours.   BNPNo results for input(s): BNP, PROBNP in the last 168 hours.   DDimer No results for input(s): DDIMER in the last 168 hours.   Radiology    US Biopsy  Result Date: 01/19/2017 INDICATION: Acute on chronic renal insufficiency. Please perform random renal biopsy for tissue diagnostic purposes. EXAM: ULTRASOUND GUIDED RENAL BIOPSY COMPARISON:  CT abdomen pelvis - 01/13/2017 MEDICATIONS: None. ANESTHESIA/SEDATION: Fentanyl 75 mcg IV; Versed 1.5 mg IV Total Moderate Sedation time: 14 minutes; The patient was continuously monitored during the  procedure by the interventional radiology nurse under my direct supervision. COMPLICATIONS: None immediate. PROCEDURE: Informed written consent was obtained from the patient after a discussion of the risks, benefits and alternatives to treatment. The patient understands and consents the procedure. A timeout was performed prior to the initiation of the procedure. Ultrasound scanning was performed of the bilateral flanks. The inferior pole of the left kidney was selected for biopsy due to location and sonographic window. The procedure was planned. The operative site was prepped and draped in the usual sterile fashion. The overlying soft tissues were anesthetized with 1% lidocaine with epinephrine. A 17 gauge core needle biopsy device was advanced into the inferior cortex of the left kidney and 2 core biopsies were obtained under direct ultrasound guidance. Real time pathologic review confirmed adequate tissue acquisition. Images were saved for documentation purposes. The biopsy device was removed and hemostasis was obtained with manual compression. Post procedural scanning was negative for significant post procedural hemorrhage or additional complication. A dressing was placed. The patient tolerated the procedure well without immediate post procedural complication. IMPRESSION: Technically successful ultrasound guided left renal biopsy. Electronically Signed   By: Sandi Mariscal M.D.   On: 01/19/2017 11:50    Cardiac Studies   Echocardiogram 01/20/2017  Study Conclusions  - Left ventricle: The cavity size was normal. Systolic function was normal. The estimated ejection fraction was in the range of 60% to 65%. Wall motion was normal; there were no regional wall motion abnormalities. There was an increased relative contribution of atrial contraction to ventricular filling. Doppler parameters are consistent with abnormal left ventricular relaxation (grade 1 diastolic dysfunction). - Mitral valve:  Calcified annulus. There was mild regurgitation. - Pulmonary arteries: Systolic pressure could not be accurately estimated  Patient Profile     76 y.o.femalewith a hx of CAD s/p NSTEMI/PCI, CKD, hypertension, hyperlipidemia, chronic normocytic anemia OSA and pulmonary fibrosis who is being seenfor the evaluation of atrial fibrillation with RVRat the request of Dr. Florencia Reasons.  Assessment & Plan  Atrial Fibrillation:  Tachybrady. Jody Ruiz unfortunately has had uncontrolled episodes of tachycardia (flutter) requiring IV lopressor for control. She is on Metoprolol tartrate 25 mg BID and this appeared to be working initially. Her blood pressure is also elevated, will consider increasing metoprolol dose to 50 mg BID, this may help somewhat improve blood pressure as well.  --- She will likely need pacemaker in future but this will need to be at a later time when her renal concerns have been thoroughly addressed. She will require anticoagulation with able (Jody B Knighthas a CHA2DS2 - VASc score of 3) after no further invasive procedures are anticipated.   Hypertension: Blood pressures elevated again today, she just recently had her Norvasc dose increased. Metoprolol dose increased to 50 mg BID, will follow BP.  Hypomagnesemia: Magnesium is 1.5, goal is to keep magnesium over 2.0. Recommend giving Magnesium.  Hypokalemia: Potassium 2.8 yesterday and replaced last night. Normal today at 3.5. 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    Signed, Linus Mako, PA-C  01/21/2017, 7:38 AM    History and all data above reviewed.  Patient examined.  I agree with the findings as above.  The patient wants to go home.  She denies chest pain or SOB.  The patient exam reveals COR:RRR  ,  Lungs: Clear  ,  Abd: Positive bowel sounds, no rebound no guarding,  Ext No edema  .  All available labs, radiology testing, previous records reviewed. Agree with documented assessment and plan. ATRIAL FIB:  OK to go home with increased beta blocker.  We also talked about taking an extra 25 mg if she has PAF with rapid rate at home.  She is going to follow up with Dr. Martinique to talk about pacemaker for her tachybrady.  She will also get an appt in our [facility] to start this.  We will have to call her with that appt.  Please send her home with enough at least one 50 mg metoprolol tartrate to take if needed tonight as she cannot get her prescription until the morning.)    Minus Breeding  8:17 PM  01/21/2017

## 2017-01-21 NOTE — Progress Notes (Addendum)
[facility] Progress Note  Subjective: sleeping, easily awakened.  Afib with RVR last night requiring IV metop pushes.  Had a very small transient rash on R forearm yesterday which has resolved.    Vitals:   01/20/17 1500 01/20/17 2037 01/20/17 2325 01/21/17 0538  BP: (!) 145/68 (!) 167/80 134/68 (!) 153/75  Pulse: 64 68 97 72  Resp: 18 18  18   Temp: 98.4 F (36.9 C) 98.5 F (36.9 C)  98.3 F (36.8 C)  TempSrc: Oral Oral  Oral  SpO2: 97% 99% 99% 100%  Weight:    94.1 kg (207 lb 7.3 oz)    Inpatient medications: . amLODipine  5 mg Oral Daily  . aspirin EC  81 mg Oral Daily  . cyclophosphamide  75 mg Oral Daily  . famotidine  20 mg Oral BID  . heparin subcutaneous  5,000 Units Subcutaneous Q8H  . isosorbide mononitrate  60 mg Oral QHS  . metoprolol tartrate  25 mg Oral BID  . predniSONE  60 mg Oral Q breakfast  . saccharomyces boulardii  250 mg Oral BID  . sucralfate  1 g Oral BID PC   . magnesium sulfate 1 - 4 g bolus IVPB     acetaminophen **OR** acetaminophen, dicyclomine, hydrALAZINE, ondansetron **OR** ondansetron (ZOFRAN) IV  Exam: GEN older adult, WDWN , calm HEENT EOMI, sclerae anicteric NECK no JVD PULM clear bilaterally  CV irregular,  ABD nontender EXT no LE edema NEURO nonfocal  Urine sediment (by Dr Jonnie Finner at time of initial c/s) > 50-100 rbc's /hpf, +dysmorphic RBC's, +gran casts , some degen cellular casts , WBC 10-25 per hpf  Assessment/ Plan: 1   Renal failure - creat 0.9 last year, up to 1.5 in March, creatinine improving and is in the low 4s. Urine sediment nephritic and MPO Ab ++, S/p pulse dose steroids, on pred 60. 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  Started Cytoxan PO yesterday; Cr appears to be plateauing, will reduce to 75 mg daily and follow for recurrence of rash; not sure it's a SE of medication but will monitor closely.  She will follow up with me in clinic in 3-4 and I  am arranging followup; she has an appointment with me at our office 8/10 at 9:15 am; check-in at 8:45 am.  She will need labs every 10 days which I am also arranging.  I have ordered a G6PD for AM lab draw as pt will need PJP prophylaxis and she's allergic to sulfa.   2  AFib/ RVR - cardiology seeing, on BB for now; had more Afib overnight which required IV metop  Per cardiology; want to start warfarin.  Would wait 48-72 hours after biopsy to start.   3 CAD hx stents x 2 4 Pulm fibrosis - mild, not on O2 or immunosuppression, seen by Dr Elsworth Soho and also at IPF clinic at [facility] 5  Hx microscopic colitis- followed by Dr. Hilarie Fredrickson. 6.  Dispo: likely pending control of Afib; she could potentially be discharged today from my perspective as long as she tolerates the Cytoxan.   Madelon Lips MD [facility] pgr [phone]  01/21/2017, 12:56 PM    Recent Labs Lab 01/19/17 0528 01/20/17 0852 01/21/17 0653  NA 139 138 140  K 3.5 2.8* 3.5  CL 107 103 105  CO2 19* 23 26  GLUCOSE 160* 169* 168*  BUN 50* 64* 69*  CREATININE 4.02* 3.73* 3.65*  CALCIUM 8.5*  8.4* 8.0*    Recent Labs Lab 01/21/17 0653  ALBUMIN 2.7*    Recent Labs Lab 01/19/17 0528 01/20/17 0852 01/21/17 0653  WBC 19.8* 17.6* 13.4*  NEUTROABS 18.5* 14.9* 10.4*  HGB 10.0* 10.8* 9.8*  HCT 29.2* 32.0* 29.2*  MCV 86.4 87.7 88.5  PLT 282 328 261   Iron/TIBC/Ferritin/ %Sat No results found for: IRON, TIBC, FERRITIN, IRONPCTSAT

## 2017-01-21 NOTE — Progress Notes (Signed)
PROGRESS NOTE  Jody Ruiz XKG:[id number] DOB: 09-20-1940 DOA: 01/13/2017 PCP: Burnard Bunting, MD  Brief summary:   H/o miscroscopic colitis, pulmonary fibrosis, presented with acute on Chronic abdominal pain,diarrhea,  poor oral intake, found to have ARF, s/p renal biopsy, awaiting for pathology result, treating for possible glumerulonephritis,  GI signed off, nerphology consulted. She also has PAF, cardiology consulted.    HPI/Recap of past 24 hours: She is worry about taking Carafate and Pepcid and risk for constipation.  She had 2 BM yesterday. Small BM today. No diarrhea.  She had a rash right arm yesterday. Only new medication was cytoxan.  She denies reflux like symptoms.     Assessment/Plan: Principal Problem:   Acute kidney injury superimposed on chronic kidney disease (HCC) Active Problems:   Hypertension   Obesity   IPF (idiopathic pulmonary fibrosis) (HCC)   HLD (hyperlipidemia)   Microscopic colitis   Diarrhea with dehydration   Belching   Gastroesophageal reflux disease   H/O umbilical hernia repair   Dyspepsia   AKI (acute kidney injury) (HCC)   Loose stools   History of Clostridium difficile colitis   Glomerulonephritis   SOB (shortness of breath)   Atrial fibrillation with RVR (HCC)   Acute kidney injury superimposed on chronic kidney disease (HCC) --Baseline creatinine 1.5. Cr  5.7 on admission. BUN also significantly elevated. -CT abdomen does not show any evidence of urinary obstruction.  -cr has not improved as expected after two days of hydration, nephrology consulted. 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  -plan to continue with Cytoxan lower dose at 75.  -discussed with  renal no need for sodium bicarb tablet.   New onset of afib/RVR, paroxysmal  She report h/o osa, she does not want to use hospital cpap, she does not want to bring in her own due to fear of getting her own machine contaminated  Cardiology consulted Had an episode last night with HR in the 160. She received IV metoprolol.  She is on metoprolol 25 mg BID, Cardiology considering adjusting medications.  Per cardiology   Hypokalemia/hypomagnesemia: replace k, mag, keep k>4, mag >2.  replete mg IV.   Chronic abdominal pain. Diarrhea, History of microscopic colitis. -She  Report chronic lower abdominal pain, Diarrhea with dehydration. And significant weight loss -she was recently treated for cdiff, repeat cdiff testing on 7/5 was negative -she had an unremarkable ekg on 7/9 -gi consulted, recommend " consider MR-A to eval mesenteric vessels and exclude intestinal angina/vessel disease" ,  " Could consider flex sig for bx to exclude lymphocytic colitis   -will avoid ppi for now due to concerns of microscopic colitis and h/o cdiff,  -she has been on asa for cad, asa is also associated with colitis, I have discussed with gi, ok to continue asa for now. -on prn Bentyl when necessary at a lower body aches ,Avoid narcotics. -gi symptoms has improved. Initially planned gi imaging and scope on hold due to improving of gi symptom and now focus on renal and cardiac issues -f/u GI after discharge.  GERD;  She was having belching and reflux like symptoms.  She has improved on Pepcid and Carafate.  She question needs for Carafate.  Discussed with GI, could reduce Carafate dose.  Right sided abdominal pain with scan drainage from her umbilicus:  Ct ab/pel: "Umbilical hernia repair with bulging of the mesh" Per GI" right sided pain may be related to her previous hernia surgery/mesh. Should follow-up with Dr. Hassell Done, surgery, as an outpatient in regards to this. It is chronic and not an acute inpatient  issue, but she certainly may have chronic inflammation there causing these symptoms"    Essential hypertension.  titrate home bp med to avoid hypotension  In the setting of aki  Chronic anemia. normocytic hgb stable at 10 during this admission   H/o CAD s/p stents: on asa, toprolxl, imdur  Body mass index is 32.49 kg/m.   Anxiety: labile mood, cries at times, she does not likely anxiety to be mentioned.   DVT Prophylaxis: subcutaneous Heparin which patient refuses at times  Advance goals of care discussion: full code   Consults:  Gastroenterology  nephrology   Cardiology  Interventional radiology  Family Communication: patient  Disposition: hopefully soon with nephrology/cardiology clearance   Procedures:  Renal biopsy by interventional radiology  on 7/16  Antibiotics:  none   Objective: BP (!) 153/75 (BP Location: Right Arm)   Pulse 72   Temp 98.3 F (36.8 C) (Oral)   Resp 18   Wt 94.1 kg (207 lb 7.3 oz)   LMP  (LMP Unknown)   SpO2 100%   BMI 32.49 kg/m   Intake/Output Summary (Last 24 hours) at 01/21/17 1311 Last data filed at 01/21/17 0645  Gross per 24 hour  Intake              730 ml  Output                0 ml  Net              730 ml   Filed Weights   01/19/17 0539 01/20/17 0500 01/21/17 0538  Weight: 98.2 kg (216 lb 7.9 oz) 95.3 kg (210 lb 1.6 oz) 94.1 kg (207 lb 7.3 oz)    Exam:   General:  NAD  Cardiovascular: S 1, S 2 RRR  Respiratory: CTA  Abdomen: Soft, NT, ND  Musculoskeletal: No edema  Neuro: non focal.   Data Reviewed: Basic Metabolic Panel:  Recent Labs Lab 01/17/17 0549 01/18/17 0541 01/18/17 1425 01/19/17 0528 01/20/17 0852 01/21/17 0653  NA 141 140  --  139 138 140  K 3.7 3.6  --  3.5 2.8* 3.5  CL 111 113*  --  107 103 105  CO2 21* 17*  --  19* 23 26  GLUCOSE 177* 175*  --  160* 169* 168*  BUN 38* 44*  --  50* 64* 69*  CREATININE 4.27* 4.36*  --  4.02* 3.73* 3.65*  CALCIUM 8.4* 8.4*  --  8.5*  8.4* 8.0*  MG  --   --  1.4*  --   --  1.5*   Liver Function Tests:  Recent Labs Lab 01/21/17 0653  ALBUMIN 2.7*   No results for input(s): LIPASE, AMYLASE in the last 168 hours. No results for input(s): AMMONIA in the last 168 hours. CBC:  Recent Labs Lab 01/15/17 0533 01/19/17 0528 01/20/17 0852 01/21/17 0653  WBC 8.1 19.8* 17.6* 13.4*  NEUTROABS 5.6 18.5* 14.9* 10.4*  HGB 10.1* 10.0* 10.8* 9.8*  HCT 30.0* 29.2* 32.0* 29.2*  MCV 89.3 86.4 87.7 88.5  PLT 301 282 328 261   Cardiac Enzymes:   No results for input(s): CKTOTAL, CKMB, CKMBINDEX, TROPONINI in the  last 168 hours. BNP (last 3 results)  Recent Labs  11/05/16 0755  BNP 65.3    ProBNP (last 3 results) No results for input(s): PROBNP in the last 8760 hours.  CBG: No results for input(s): GLUCAP in the last 168 hours.  Recent Results (from the past 240 hour(s))  Urine culture     Status: Abnormal   Collection Time: 01/13/17 11:09 AM  Result Value Ref Range Status   Specimen Description URINE, CLEAN CATCH  Final   Special Requests NONE  Final   Culture (A)  Final    <10,000 COLONIES/mL INSIGNIFICANT GROWTH Performed at [facility], [address] [place], [place] [address]    Report Status 01/14/2017 FINAL  Final  C difficile quick scan w PCR reflex     Status: None   Collection Time: 01/15/17  1:50 PM  Result Value Ref Range Status   C Diff antigen NEGATIVE NEGATIVE Final   C Diff toxin NEGATIVE NEGATIVE Final   C Diff interpretation No C. difficile detected.  Final     Studies: No results found.  Scheduled Meds: . amLODipine  5 mg Oral Daily  . aspirin EC  81 mg Oral Daily  . cyclophosphamide  75 mg Oral Daily  . famotidine  20 mg Oral BID  . heparin subcutaneous  5,000 Units Subcutaneous Q8H  . isosorbide mononitrate  60 mg Oral QHS  . metoprolol tartrate  25 mg Oral BID  . predniSONE  60 mg Oral Q breakfast  . saccharomyces boulardii  250 mg Oral BID  . sucralfate  1 g Oral  BID PC    Continuous Infusions: . magnesium sulfate 1 - 4 g bolus IVPB 2 g (01/21/17 1258)     Time spent: 24mins  Elmarie Shiley MD, PhD  [facility] Pager [phone]. If 7PM-7AM, please contact night-coverage at [email or web address], password [email or web address] 01/21/2017, 1:11 PM  LOS: 7 days

## 2017-01-22 ENCOUNTER — Telehealth: Payer: Self-pay

## 2017-01-22 LAB — COMPREHENSIVE METABOLIC PANEL
ALBUMIN: 3.1 g/dL — AB (ref 3.5–5.0)
ALK PHOS: 50 U/L (ref 38–126)
ALT: 24 U/L (ref 14–54)
ANION GAP: 9 (ref 5–15)
AST: 18 U/L (ref 15–41)
BUN: 65 mg/dL — AB (ref 6–20)
CALCIUM: 8.4 mg/dL — AB (ref 8.9–10.3)
CO2: 29 mmol/L (ref 22–32)
CREATININE: 3.22 mg/dL — AB (ref 0.44–1.00)
Chloride: 104 mmol/L (ref 101–111)
GFR calc Af Amer: 15 mL/min — ABNORMAL LOW (ref >60)
GFR calc non Af Amer: 13 mL/min — ABNORMAL LOW (ref >60)
GLUCOSE: 116 mg/dL — AB (ref 65–99)
Potassium: 3.8 mmol/L (ref 3.5–5.1)
SODIUM: 142 mmol/L (ref 135–145)
TOTAL PROTEIN: 6.1 g/dL — AB (ref 6.5–8.1)
Total Bilirubin: 0.6 mg/dL (ref 0.3–1.2)

## 2017-01-22 LAB — MAGNESIUM: Magnesium: 1.6 mg/dL — ABNORMAL LOW (ref 1.7–2.4)

## 2017-01-22 MED ORDER — METOPROLOL TARTRATE 25 MG PO TABS: 25.0000 mg | ORAL_TABLET | Freq: Three times a day (TID) | ORAL | 0 refills | Status: DC

## 2017-01-22 MED FILL — CYCLOPHOSPHAMIDE 25 MG CAPS: 25 | 7 days supply | Qty: 21 | Fill #0

## 2017-01-22 NOTE — Discharge Summary (Signed)
Physician Discharge Summary  Jody Ruiz TOI:[id number] DOB: 03/12/1941 DOA: 01/13/2017  PCP: Burnard Bunting, MD  Admit date: 01/13/2017 Discharge date: 01/22/2017  Admitted From: Home  Disposition:  Home   Recommendations for Outpatient Follow-up:  1. Follow up with PCP in 1-2 weeks 2. Please obtain BMP/CBC in one week 3. Follow up with cardiology, to consider start coumadin.    Discharge Condition: Stable.  CODE STATUS: Full code.  Diet recommendation: Heart Healthy   Brief/Interim Summary: H/o miscroscopic colitis, pulmonary fibrosis, presented with acute on Chronic abdominal pain,diarrhea,  poor oral intake, found to have ARF, s/p renal biopsy, awaiting for pathology result, treating for possible glumerulonephritis,  GI signed off, nerphology consulted. She also has PAF, cardiology consulted.   HPI/Recap of past 24 hours: She is doing ok. No further rash.      Assessment/Plan: Principal Problem:   Acute kidney injury superimposed on chronic kidney disease (HCC) Active Problems:   Hypertension   Obesity   IPF (idiopathic pulmonary fibrosis) (HCC)   HLD (hyperlipidemia)   Microscopic colitis   Diarrhea with dehydration   Belching   Gastroesophageal reflux disease   H/O umbilical hernia repair   Dyspepsia   AKI (acute kidney injury) (HCC)   Loose stools   History of Clostridium difficile colitis   Glomerulonephritis   SOB (shortness of breath)   Atrial fibrillation with RVR (HCC)   Acute kidney injury superimposed on chronic kidney disease (HCC) --Baseline creatinine 1.5. Cr  5.7 on admission. BUN also significantly elevated. -CT abdomen does not show any evidence of urinary obstruction.  -cr has not improved as expected after two days of hydration, nephrology consulted. 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  -plan to continue with Cytoxan lower dose at 75.  -discharge on prednisone also. Follow up with Dr Hollie Salk in one week for labs.    New onset of afib/RVR, paroxysmal  She report h/o osa, she does not want to use hospital cpap, she does not want to bring in her own due to fear of getting her own machine contaminated  Cardiology consulted Had an episode last night with HR in the 160. She received IV metoprolol.  Discussed with Dr Percival Spanish, patient can be discharge on metoprolol 25 mg TID. She could use extra tablet if she feels palpitation, tachycardia. I have explain this to patient  Per cardiology   Hypokalemia/hypomagnesemia: replace k, mag, keep k>4, mag >2.  Decline IV magnesium supplementation.  She will be discharge on oral magnesium   Chronic abdominal pain. Diarrhea, History of microscopic colitis. -She  Report chronic lower abdominal pain, Diarrhea with dehydration. And significant weight loss -she was recently treated for cdiff, repeat cdiff testing on 7/5 was negative -she had an unremarkable ekg on 7/9 -gi consulted, recommend " consider MR-A to eval mesenteric vessels and exclude intestinal angina/vessel disease" ,  " Could consider flex sig for bx to exclude lymphocytic colitis   -will avoid ppi for now due to concerns of microscopic colitis and h/o cdiff,  -she has been on asa for cad, asa is also associated with colitis, I have discussed with gi, ok to continue asa for now. -on prn Bentyl when necessary at a lower body aches ,Avoid narcotics. -gi symptoms has  improved. Initially planned gi imaging and scope on hold due to improving of gi symptom and now focus on renal and cardiac issues -f/u GI after discharge. discussed with GI; patient to continue taking Pepcid and Carafate, could reduce Carafate dose frequency  to BID.   GERD;  She was  having belching and reflux like symptoms.  She has improved on Pepcid and Carafate.  She question needs for Carafate.  Discussed with GI, could reduce Carafate dose frequency    Right sided abdominal pain with scan drainage from her umbilicus:  Ct ab/pel: "Umbilical hernia repair with bulging of the mesh" Per GI" right sided pain may be related to her previous hernia surgery/mesh. Should follow-up with Dr. Hassell Done, surgery, as an outpatient in regards to this. It is chronic and not an acute inpatient issue, but she certainly may have chronic inflammation there causing these symptoms"    Essential hypertension.  titrate home bp med to avoid hypotension  In the setting of aki  Chronic anemia. normocytic hgb stable at 10 during this admission   H/o CAD s/p stents: on asa, toprolxl, imdur  Body mass index is 32.49 kg/m.   Discharge Diagnoses:  Principal Problem:   Acute kidney injury superimposed on chronic kidney disease ([place]) Active Problems:   Hypertension   Obesity   IPF (idiopathic pulmonary fibrosis) (HCC)   HLD (hyperlipidemia)   Microscopic colitis   Diarrhea with dehydration   Belching   Gastroesophageal reflux disease   H/O umbilical hernia repair   Dyspepsia   AKI (acute kidney injury) ([place])   Loose stools   History of Clostridium difficile colitis   Glomerulonephritis   SOB (shortness of breath)   Atrial fibrillation with RVR [facility])    Discharge Instructions  Discharge Instructions    Diet - low sodium heart healthy    Complete by:  As directed    Increase activity slowly    Complete by:  As directed      Allergies as of 01/22/2017      Reactions   Brilinta [ticagrelor] Shortness Of Breath, Other (See Comments), Cough   Reaction:  Fatigue   Lisinopril Shortness Of Breath, Cough   Statins Other (See Comments)   Reaction:  Muscle pain/weakness   Zetia [ezetimibe] Other (See Comments)   Reaction:  Muscle pain/weakness    Macrodantin  [nitrofurantoin Macrocrystal] Rash   Sulfa Antibiotics Rash      Medication List    STOP taking these medications   metoprolol succinate 25 MG 24 hr tablet Commonly known as:  TOPROL-XL   omeprazole 20 MG capsule Commonly known as:  PRILOSEC   ranitidine 150 MG tablet Commonly known as:  ZANTAC     TAKE these medications   amLODipine 5 MG tablet Commonly known as:  NORVASC Take 5 mg by mouth daily.   aspirin EC 81 MG tablet Take 81 mg by mouth daily.   cyclophosphamide 25 MG capsule Commonly known as:  CYTOXAN Take 3 capsules (75 mg total) by mouth daily. Give on an empty stomach 1 hour before or 2 hours after meals.   dicyclomine 10 MG capsule Commonly known as:  BENTYL Take 1 capsule (10 mg total) by mouth 3 (three) times daily as needed for spasms.   famotidine 20 MG tablet Commonly known as:  PEPCID Take 1 tablet (20 mg total) by mouth 2 (two) times daily.   isosorbide mononitrate 60 MG 24 hr tablet Commonly known as:  IMDUR Take 60 mg by mouth at  bedtime.   magnesium oxide 400 (241.3 Mg) MG tablet Commonly known as:  MAG-OX Take 1 tablet (400 mg total) by mouth 2 (two) times daily.   metoprolol tartrate 25 MG tablet Commonly known as:  LOPRESSOR Take 1 tablet (25 mg total) by mouth 3 (three) times daily.   nitroGLYCERIN 0.4 MG SL tablet Commonly known as:  NITROSTAT Place 1 tablet (0.4 mg total) under the tongue every 5 (five) minutes x 3 doses as needed for chest pain.   ondansetron 4 MG tablet Commonly known as:  ZOFRAN Take 1 tablet (4 mg total) by mouth every 8 (eight) hours as needed for nausea or vomiting.   predniSONE 20 MG tablet Commonly known as:  DELTASONE Take 3 tablets (60 mg total) by mouth daily with breakfast.   saccharomyces boulardii 250 MG capsule Commonly known as:  FLORASTOR Take 1 capsule (250 mg total) by mouth 2 (two) times daily.   sucralfate 1 GM/10ML suspension Commonly known as:  CARAFATE Take 10 mLs (1 g total) by  mouth 2 (two) times daily after a meal.      Follow-up Information    Johnathan Hausen, MD Follow up.   Specialty:  General Surgery Why:  right sided abdominal pain with scan drainage from umbilicus "Umbilical hernia repair with bulging of the mesh" on ct ab Contact information: [address] [place] [address] [phone]        [facility]. Go on 01/28/2017.   Why:  Your appointment is for 3:30pm, please arrive 10-15 minutes early. Contact information: [facility]       [place], [place], [place]. Go on 02/05/2017.   Specialties:  Cardiology, Radiology Why:  Please go your appointment at 8:00 am. You will see one of the physician assistants that works with Dr. Martinique. Contact information: [address] [place] [address] [phone]          Allergies  Allergen Reactions  . Brilinta [Ticagrelor] Shortness Of Breath, Other (See Comments) and Cough    Reaction:  Fatigue  . Lisinopril Shortness Of Breath and Cough  . Statins Other (See Comments)    Reaction:  Muscle pain/weakness  . Zetia [Ezetimibe] Other (See Comments)    Reaction:  Muscle pain/weakness   . Macrodantin [Nitrofurantoin Macrocrystal] Rash  . Sulfa Antibiotics Rash    Consultations:  Cardiology  Nephrology    Procedures/Studies: Ct Abdomen Pelvis Wo Contrast  Result Date: 01/13/2017 CLINICAL DATA:  Right lower quadrant abdominal pain since January with weight loss EXAM: CT ABDOMEN AND PELVIS WITHOUT CONTRAST TECHNIQUE: Multidetector CT imaging of the abdomen and pelvis was performed following the standard protocol without IV contrast. COMPARISON:  11/18/2003 FINDINGS: Lower chest: There is subpleural honeycombing in the posterior costophrenic sulci. Mediastinal right lower lobe, also seen on chest CT from 2017. Coronary atherosclerosis Hepatobiliary: No focal liver abnormality.Cholecystectomy. Pancreas: Unremarkable. Spleen: Unremarkable. Adrenals/Urinary  Tract: Negative adrenals. No hydronephrosis or ureteral stone. Tiny lower pole calculus on the left. Interpolar parenchymal bar. Presumed hemorrhagic cyst in the lower pole left kidney measuring 8 mm. Unremarkable bladder. Stomach/Bowel: No bowel obstruction or inflammation. No notable diverticula. Appendectomy per history. Vascular/Lymphatic: No acute vascular abnormality. Atherosclerosis of the aorta and iliacs. No acute or focal finding. No mass or adenopathy. Reproductive:No pathologic findings. Other: No ascites or pneumoperitoneum. Umbilical hernia repaired with mesh that is lax and bulging. Musculoskeletal: No acute abnormalities. Simple appearing intramuscular lipoma in the lateral left gluteal musculature measuring up to 7  x 5 cm. Advanced lumbar disc degeneration with levoscoliosis. IMPRESSION: 1. No specific explanation for abdominal pain. 2. Umbilical hernia repair with bulging of the mesh. 3. Subpleural fibrosis at the bases, reference chest CT 06/02/2016. 4. Advanced lumbar disc degeneration with levoscoliosis. 5. 7 cm intramuscular left gluteal lipoma. 6. Tiny left renal calculus. Electronically Signed   By: Monte Fantasia M.D.   On: 01/13/2017 13:45   Dg Chest 2 View  Result Date: 01/13/2017 CLINICAL DATA:  Chronic abdominal pain, belching and nausea for months. Endoscopy yesterday. EXAM: CHEST  2 VIEW COMPARISON:  Chest x-rays dated 11/05/2016 05/23/2015. FINDINGS: Heart size is upper normal, stable. Overall cardiomediastinal silhouette is stable. Atherosclerotic changes noted at the aortic arch. Evaluation of the lungs somewhat limited by patient motion artifact. Lungs appear grossly clear. No pleural effusion or pneumothorax seen. Mild degenerative spurring throughout the thoracic spine. No acute or suspicious osseous finding. Stable chronic elevation of the right hemidiaphragm. IMPRESSION: 1. No active cardiopulmonary disease. No evidence of pneumonia or pulmonary edema. 2. Aortic  atherosclerosis. Electronically Signed   By: Franki Cabot M.D.   On: 01/13/2017 12:08   US Biopsy  Result Date: 01/19/2017 INDICATION: Acute on chronic renal insufficiency. Please perform random renal biopsy for tissue diagnostic purposes. EXAM: ULTRASOUND GUIDED RENAL BIOPSY COMPARISON:  CT abdomen pelvis - 01/13/2017 MEDICATIONS: None. ANESTHESIA/SEDATION: Fentanyl 75 mcg IV; Versed 1.5 mg IV Total Moderate Sedation time: 14 minutes; The patient was continuously monitored during the procedure by the interventional radiology nurse under my direct supervision. COMPLICATIONS: None immediate. PROCEDURE: Informed written consent was obtained from the patient after a discussion of the risks, benefits and alternatives to treatment. The patient understands and consents the procedure. A timeout was performed prior to the initiation of the procedure. Ultrasound scanning was performed of the bilateral flanks. The inferior pole of the left kidney was selected for biopsy due to location and sonographic window. The procedure was planned. The operative site was prepped and draped in the usual sterile fashion. The overlying soft tissues were anesthetized with 1% lidocaine with epinephrine. A 17 gauge core needle biopsy device was advanced into the inferior cortex of the left kidney and 2 core biopsies were obtained under direct ultrasound guidance. Real time pathologic review confirmed adequate tissue acquisition. Images were saved for documentation purposes. The biopsy device was removed and hemostasis was obtained with manual compression. Post procedural scanning was negative for significant post procedural hemorrhage or additional complication. A dressing was placed. The patient tolerated the procedure well without immediate post procedural complication. IMPRESSION: Technically successful ultrasound guided left renal biopsy. Electronically Signed   By: Sandi Mariscal M.D.   On: 01/19/2017 11:50   Dg Chest Port 1  View  Result Date: 01/16/2017 CLINICAL DATA:  Short of breath EXAM: PORTABLE CHEST 1 VIEW COMPARISON:  01/13/2017 FINDINGS: Normal mediastinum and cardiac silhouette. Normal pulmonary vasculature. No evidence of effusion, infiltrate, or pneumothorax. No acute bony abnormality. IMPRESSION: No acute cardiopulmonary process. Electronically Signed   By: Suzy Bouchard M.D.   On: 01/16/2017 10:32    (Echo, Carotid, EGD, Colonoscopy, ERCP)    Subjective:   Discharge Exam: Vitals:   01/21/17 2032 01/22/17 0529  BP: (!) 155/77 (!) 155/76  Pulse: 67 65  Resp: 20 20  Temp: 98 F (36.7 C) 98.3 F (36.8 C)   Vitals:   01/21/17 1314 01/21/17 1500 01/21/17 2032 01/22/17 0529  BP:  (!) 158/65 (!) 155/77 (!) 155/76  Pulse:  64 67 65  Resp:  18 20 20   Temp:  98.2 F (36.8 C) 98 F (36.7 C) 98.3 F (36.8 C)  TempSrc:  Oral Oral Oral  SpO2:  98% 98% 97%  Weight: 90.7 kg (200 lb)   92.3 kg (203 lb 6.4 oz)    General: Pt is alert, awake, not in acute distress Cardiovascular: RRR, S1/S2 +, no rubs, no gallops Respiratory: CTA bilaterally, no wheezing, no rhonchi Abdominal: Soft, NT, ND, bowel sounds + Extremities: no edema, no cyanosis    The results of significant diagnostics from this hospitalization (including imaging, microbiology, ancillary and laboratory) are listed below for reference.     Microbiology: Recent Results (from the past 240 hour(s))  Urine culture     Status: Abnormal   Collection Time: 01/13/17 11:09 AM  Result Value Ref Range Status   Specimen Description URINE, CLEAN CATCH  Final   Special Requests NONE  Final   Culture (A)  Final    <10,000 COLONIES/mL INSIGNIFICANT GROWTH Performed at [facility], [address], [place], [place] [address]    Report Status 01/14/2017 FINAL  Final  C difficile quick scan w PCR reflex     Status: None   Collection Time: 01/15/17  1:50 PM  Result Value Ref Range Status   C Diff antigen NEGATIVE NEGATIVE Final   C  Diff toxin NEGATIVE NEGATIVE Final   C Diff interpretation No C. difficile detected.  Final     Labs: BNP (last 3 results)  Recent Labs  11/05/16 0755  BNP 25.4   Basic Metabolic Panel:  Recent Labs Lab 01/18/17 0541 01/18/17 1425 01/19/17 0528 01/20/17 0852 01/21/17 0653 01/22/17 0534  NA 140  --  139 138 140 142  K 3.6  --  3.5 2.8* 3.5 3.8  CL 113*  --  107 103 105 104  CO2 17*  --  19* 23 26 29   GLUCOSE 175*  --  160* 169* 168* 116*  BUN 44*  --  50* 64* 69* 65*  CREATININE 4.36*  --  4.02* 3.73* 3.65* 3.22*  CALCIUM 8.4*  --  8.5* 8.4* 8.0* 8.4*  MG  --  1.4*  --   --  1.5* 1.6*   Liver Function Tests:  Recent Labs Lab 01/21/17 0653 01/22/17 0534  AST  --  18  ALT  --  24  ALKPHOS  --  50  BILITOT  --  0.6  PROT  --  6.1*  ALBUMIN 2.7* 3.1*   No results for input(s): LIPASE, AMYLASE in the last 168 hours. No results for input(s): AMMONIA in the last 168 hours. CBC:  Recent Labs Lab 01/19/17 0528 01/20/17 0852 01/21/17 0653  WBC 19.8* 17.6* 13.4*  NEUTROABS 18.5* 14.9* 10.4*  HGB 10.0* 10.8* 9.8*  HCT 29.2* 32.0* 29.2*  MCV 86.4 87.7 88.5  PLT 282 328 261   Cardiac Enzymes: No results for input(s): CKTOTAL, CKMB, CKMBINDEX, TROPONINI in the last 168 hours. BNP: Invalid input(s): POCBNP CBG: No results for input(s): GLUCAP in the last 168 hours. D-Dimer No results for input(s): DDIMER in the last 72 hours. Hgb A1c No results for input(s): HGBA1C in the last 72 hours. Lipid Profile No results for input(s): CHOL, HDL, LDLCALC, TRIG, CHOLHDL, LDLDIRECT in the last 72 hours. Thyroid function studies No results for input(s): TSH, T4TOTAL, T3FREE, THYROIDAB in the last 72 hours.  Invalid input(s): FREET3 Anemia work up No results for input(s): VITAMINB12, FOLATE, FERRITIN, TIBC, IRON, RETICCTPCT in the last  72 hours. Urinalysis    Component Value Date/Time   COLORURINE YELLOW 01/13/2017 1109   APPEARANCEUR CLEAR 01/13/2017 1109    LABSPEC 1.008 01/13/2017 1109   PHURINE 5.0 01/13/2017 1109   GLUCOSEU NEGATIVE 01/13/2017 1109   HGBUR LARGE (A) 01/13/2017 1109   Cataract 01/13/2017 1109   KETONESUR NEGATIVE 01/13/2017 1109   PROTEINUR 100 (A) 01/13/2017 1109   NITRITE NEGATIVE 01/13/2017 1109   LEUKOCYTESUR TRACE (A) 01/13/2017 1109   Sepsis Labs Invalid input(s): PROCALCITONIN,  WBC,  LACTICIDVEN Microbiology Recent Results (from the past 240 hour(s))  Urine culture     Status: Abnormal   Collection Time: 01/13/17 11:09 AM  Result Value Ref Range Status   Specimen Description URINE, CLEAN CATCH  Final   Special Requests NONE  Final   Culture (A)  Final    <10,000 COLONIES/mL INSIGNIFICANT GROWTH Performed at [facility], [address] [place], [place] [address]    Report Status 01/14/2017 FINAL  Final  C difficile quick scan w PCR reflex     Status: None   Collection Time: 01/15/17  1:50 PM  Result Value Ref Range Status   C Diff antigen NEGATIVE NEGATIVE Final   C Diff toxin NEGATIVE NEGATIVE Final   C Diff interpretation No C. difficile detected.  Final     Time coordinating discharge: Over 30 minutes  SIGNED:   Elmarie Shiley, MD  [facility] 01/22/2017, 10:17 AM Pager   If 7PM-7AM, please contact night-coverage [email or web address] Password [email or web address]

## 2017-01-22 NOTE — Progress Notes (Signed)
Date: January 22, 2017  Discharge orders review for case management needs.  None found  Per patient or family member no additional needs at home. Velva Harman, BSN, [place], CCM:  [phone]

## 2017-01-22 NOTE — Telephone Encounter (Signed)
-----   Message from Loralie Champagne, PA-C sent at 01/22/2017  4:38 PM EDT ----- Can you please save this to the patient's chart?  Dr. Hilarie Fredrickson accepted her via his text messages to me.  Thank you,  Jess  ----- Message ----- From: Irene Shipper, MD Sent: 01/22/2017   4:35 PM To: Jerene Bears, MD, Loralie Champagne, PA-C  Ok with me. ----- Message ----- From: Loralie Champagne, PA-C Sent: 01/22/2017   1:10 PM To: Irene Shipper, MD, Jerene Bears, MD  Patient is wanting to switch from Dr. Henrene Pastor to Dr. Hilarie Fredrickson as her primary GI.

## 2017-01-22 NOTE — Progress Notes (Signed)
[facility] Progress Note  Subjective: Tolerated cytoxan last night.  No further rashes.    Vitals:   01/21/17 1314 01/21/17 1500 01/21/17 2032 01/22/17 0529  BP:  (!) 158/65 (!) 155/77 (!) 155/76  Pulse:  64 67 65  Resp:  18 20 20   Temp:  98.2 F (36.8 C) 98 F (36.7 C) 98.3 F (36.8 C)  TempSrc:  Oral Oral Oral  SpO2:  98% 98% 97%  Weight: 90.7 kg (200 lb)   92.3 kg (203 lb 6.4 oz)    Inpatient medications: . amLODipine  5 mg Oral Daily  . aspirin EC  81 mg Oral Daily  . cyclophosphamide  75 mg Oral Daily  . famotidine  20 mg Oral BID  . heparin subcutaneous  5,000 Units Subcutaneous Q8H  . isosorbide mononitrate  60 mg Oral QHS  . magnesium oxide  400 mg Oral BID  . metoprolol tartrate  25 mg Oral BID  . predniSONE  60 mg Oral Q breakfast  . saccharomyces boulardii  250 mg Oral BID  . sucralfate  1 g Oral BID PC    acetaminophen **OR** acetaminophen, dicyclomine, hydrALAZINE, ondansetron **OR** ondansetron (ZOFRAN) IV  Exam: GEN older adult, WDWN , calm HEENT EOMI, sclerae anicteric NECK no JVD PULM clear bilaterally  CV irregular,  ABD nontender EXT no LE edema NEURO nonfocal SKIN: no rashes  Urine sediment (by Dr Jonnie Finner at time of initial c/s) > 50-100 rbc's /hpf, +dysmorphic RBC's, +gran casts , some degen cellular casts , WBC 10-25 per hpf  Assessment/ Plan: 1   Renal failure - creat 0.9 last year, up to 1.5 in March, creatinine improving and is in the low 4s. Urine sediment nephritic and MPO Ab ++, S/p pulse dose steroids, on pred 60. 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  Started Cytoxan PO yesterday; Cr appears to be plateauing, will reduce to 75 mg daily and follow for recurrence of rash; not sure it's a SE of medication but will monitor closely.  She will follow up with me in clinic in 3-4 and I am arranging followup; she has an appointment with me at our office 8/10 at 9:15 am;  check-in at 8:45 am.  She will need labs every 7-10 days which I am also arranging; she can come to our office to have these drawn.  I am looking into PA paperwork for cytoxan and will complete if necessary.  G6PD pending.   2  AFib/ RVR - cardiology seeing, on BB for now; had more Afib overnight which required IV metop  Per cardiology; want to start warfarin.  Would wait 48-72 hours after biopsy to start.   3 CAD hx stents x 2 4 Pulm fibrosis - mild, not on O2 or immunosuppression, seen by Dr Elsworth Soho and also at [facility] clinic at [facility] 5  Hx microscopic colitis- followed by Dr. Hilarie Fredrickson. 6.  Dispo: likely pending control of Afib; she could potentially be discharged today from my perspective as long as she tolerates the Cytoxan.   Madelon Lips MD [facility] pgr [phone]  01/22/2017, 11:05 AM    Recent Labs Lab 01/20/17 [phone] 01/21/17 0653 01/22/17 0534  NA 138 140 142  K 2.8* 3.5 3.8  CL 103 105 104  CO2 23 26 29   GLUCOSE 169* 168* 116*  BUN 64* 69* 65*  CREATININE 3.73* 3.65* 3.22*  CALCIUM 8.4* 8.0* 8.4*    Recent Labs Lab 01/21/17 [phone]  01/22/17 0534  AST  --  18  ALT  --  24  ALKPHOS  --  50  BILITOT  --  0.6  PROT  --  6.1*  ALBUMIN 2.7* 3.1*    Recent Labs Lab 01/19/17 0528 01/20/17 0852 01/21/17 0653  WBC 19.8* 17.6* 13.4*  NEUTROABS 18.5* 14.9* 10.4*  HGB 10.0* 10.8* 9.8*  HCT 29.2* 32.0* 29.2*  MCV 86.4 87.7 88.5  PLT 282 328 261   Iron/TIBC/Ferritin/ %Sat No results found for: IRON, TIBC, FERRITIN, IRONPCTSAT

## 2017-01-22 NOTE — Progress Notes (Signed)
DC instructions reviewed with patient and family. IV removed. Belongings with daughter. Pt wheeled to main entrance. Paper prescriptions sent with pt.

## 2017-01-23 ENCOUNTER — Other Ambulatory Visit: Payer: Self-pay | Admitting: Nephrology

## 2017-01-25 LAB — GLUCOSE 6 PHOSPHATE DEHYDROGENASE
G6PDH: 10.9 U/g{Hb} (ref 4.6–13.5)
HEMOGLOBIN: 10.8 g/dL — AB (ref 11.1–15.9)

## 2017-01-28 ENCOUNTER — Ambulatory Visit (INDEPENDENT_AMBULATORY_CARE_PROVIDER_SITE_OTHER): Payer: Medicare Other | Admitting: Pharmacist Clinician (PhC)/ Clinical Pharmacy Specialist

## 2017-01-28 DIAGNOSIS — I2510 Atherosclerotic heart disease of native coronary artery without angina pectoris: Secondary | ICD-10-CM

## 2017-01-28 MED ORDER — WARFARIN SODIUM 2.5 MG PO TABS: 2.5000 mg | ORAL_TABLET | Freq: Every day | ORAL | 1 refills | Status: DC

## 2017-01-28 NOTE — Progress Notes (Signed)
Patient admitted to hospital 7-10 to 7-19 for acute kidney injury and new onset of paroxysmal AF with RVR.    On discharge was given metoprolol 25 mg tid.  CHADS2-VASc score is 3.  She was recommended to start on warfarin 48-72 after a kidney biopsy.   However on discharge (after the biopsy), warfarin was not started.   She is here today in the office to learn about warfarin.  A full discussion of the nature of anticoagulants has been carried out.  A benefit risk analysis has been presented to the patient, so that they understand the justification for choosing anticoagulation at this time. The need for frequent and regular monitoring, precise dosage adjustment and compliance is stressed.  Side effects of potential bleeding are discussed.  The patient should avoid any OTC items containing aspirin or ibuprofen, and should avoid great swings in general diet.  Avoid alcohol consumption.  Call if any signs of abnormal bleeding.  Patent has history this year of C diff colitis, and diarrhea, and is also on prednisone 60 mg daily.     Will start her on warfarin 2.5 mg daily and have her come back for first INR on Monday July 30.    All questions were answered.

## 2017-01-29 DIAGNOSIS — N059 Unspecified nephritic syndrome with unspecified morphologic changes: Secondary | ICD-10-CM | POA: Diagnosis not present

## 2017-02-02 ENCOUNTER — Ambulatory Visit (INDEPENDENT_AMBULATORY_CARE_PROVIDER_SITE_OTHER): Payer: Medicare Other | Admitting: Pharmacist Clinician (PhC)/ Clinical Pharmacy Specialist

## 2017-02-02 DIAGNOSIS — Z7901 Long term (current) use of anticoagulants: Secondary | ICD-10-CM | POA: Diagnosis not present

## 2017-02-02 DIAGNOSIS — I2510 Atherosclerotic heart disease of native coronary artery without angina pectoris: Secondary | ICD-10-CM

## 2017-02-02 DIAGNOSIS — I4891 Unspecified atrial fibrillation: Secondary | ICD-10-CM | POA: Diagnosis not present

## 2017-02-02 LAB — POCT INR: INR: 1.2

## 2017-02-04 ENCOUNTER — Encounter (HOSPITAL_COMMUNITY): Payer: Self-pay

## 2017-02-05 ENCOUNTER — Encounter: Payer: Self-pay | Admitting: Physician Assistant

## 2017-02-05 ENCOUNTER — Ambulatory Visit (INDEPENDENT_AMBULATORY_CARE_PROVIDER_SITE_OTHER): Payer: Medicare Other | Admitting: Physician Assistant

## 2017-02-05 ENCOUNTER — Ambulatory Visit (INDEPENDENT_AMBULATORY_CARE_PROVIDER_SITE_OTHER): Payer: Medicare Other | Admitting: Pharmacist Clinician (PhC)/ Clinical Pharmacy Specialist

## 2017-02-05 VITALS — BP 150/78 | HR 58 | Ht 67.0 in | Wt 191.2 lb

## 2017-02-05 DIAGNOSIS — Z9989 Dependence on other enabling machines and devices: Secondary | ICD-10-CM

## 2017-02-05 DIAGNOSIS — G4733 Obstructive sleep apnea (adult) (pediatric): Secondary | ICD-10-CM

## 2017-02-05 DIAGNOSIS — I10 Essential (primary) hypertension: Secondary | ICD-10-CM

## 2017-02-05 DIAGNOSIS — I2510 Atherosclerotic heart disease of native coronary artery without angina pectoris: Secondary | ICD-10-CM

## 2017-02-05 DIAGNOSIS — N289 Disorder of kidney and ureter, unspecified: Secondary | ICD-10-CM

## 2017-02-05 DIAGNOSIS — E785 Hyperlipidemia, unspecified: Secondary | ICD-10-CM

## 2017-02-05 DIAGNOSIS — I480 Paroxysmal atrial fibrillation: Secondary | ICD-10-CM

## 2017-02-05 DIAGNOSIS — I4891 Unspecified atrial fibrillation: Secondary | ICD-10-CM

## 2017-02-05 DIAGNOSIS — N189 Chronic kidney disease, unspecified: Secondary | ICD-10-CM | POA: Diagnosis not present

## 2017-02-05 DIAGNOSIS — N059 Unspecified nephritic syndrome with unspecified morphologic changes: Secondary | ICD-10-CM | POA: Diagnosis not present

## 2017-02-05 DIAGNOSIS — Z7901 Long term (current) use of anticoagulants: Secondary | ICD-10-CM

## 2017-02-05 LAB — POCT INR: INR: 2.1

## 2017-02-05 NOTE — Patient Instructions (Signed)
Isaac Laud, [place] has recommended that you monitor home blood pressures twice daily  Your physician recommends that you schedule a follow-up appointment in: TWO MONTHS with Dr. Martinique

## 2017-02-05 NOTE — Progress Notes (Signed)
Cardiology Office Note    Date:  02/06/2017   ID:  Jody Ruiz 1940-08-05, MRN [id number]  PCP:  Jody Bunting, MD  Cardiologist:  Dr. Martinique   Chief Complaint  Patient presents with  . Follow-up    seen for Dr. Martinique    History of Present Illness:  Jody Ruiz is a 76 y.o. female with PMH of CAD, HLD, HTN, idiopathic pulmonary fibrosis, OSA on CPAP and recently diagnosed PAF. She had NSTEMI in 04/2012 were she received 2 DES to mid LAD. In January 2014, she presented with recurrent symptoms and underwent repeat cardiac catheterization lab showed continued patency of the stents. Myoview obtained in September 2014 showed some apical ischemia. Repeat cardiac catheterization continued to show patent stents. In December 2015, she had chest pain, cough and dyspnea. Left and right heart cath was essentially normal. Her last Myoview was performed on 11/06/2016 which showed EF greater than 65%, low risk study, primarily small fixed mild basal inferolateral perfusion defect, no evidence of ischemia, EKG portion showed mild ST depression of 0.3 mm.  More recently, she presented to the hospital on 01/18/2017 with nausea and abdominal pain and found to have acute on chronic renal failure. Her baseline creatinine was around 1.5, on first arrival to the hospital, her creatinine was 5.7. She was also found to be in atrial fibrillation with heart rate of 140, she had postconversion pauses up to 3.3 second. There were some previous thought of possible tachybradycardia syndrome as well, and she likely will require pacemaker in the future. Her long-acting metoprolol was switched to Lopressor. Her metoprolol was eventually increased to 25mg  TID.  She presents today for cardiology office visit. She denies any recent dizziness, blurred vision or presyncope. She is currently in sinus bradycardia with PVCs by EKG. Her blood pressure is elevated today, however at home, her blood pressure sometimes goes  down to the 80s to 90s. I asked her to keep a blood pressure diary with 2 readings per day. Otherwise, her INRs therapeutic at 2.1. She has upcoming visit with nephrology service. She can follow-up with cardiology service in 2 months for blood pressure medication adjustment. Otherwise she does not need a pacemaker at this point. She has good cardiac awareness of atrial fibrillation, I told her to take an additional 25 mg metoprolol on as-needed basis for recurrence.   Past Medical History:  Diagnosis Date  . Arthritis   . Atrial fibrillation ([place])   . Borderline hypothyroidism   . CAD (coronary artery disease)    a. 04/2012 NSTEMI/Cath:  pLAD 90%, mLAD 70-80% (long) (3x16 & 2.5x28 Promus DES to p/m LAD), pD1 70-80%, pRI 30% (small), CFX 20%, OM1 40%, mOM2 30%, pRCA 30%, PDA 30-40%, pPLB 70%, then mid 90%, EF 65%;  b. 03/2013 Abnl CL w/apical isch;  c. 04/2013 Cath: LM nl, LAD patent stents, LCX <20, RCA 30p PDA 40-50 EF 55-60% ->Cont Med Rx.  c. CP s/p LHC with stable dz and patent stents--> Rx medically   . Cataract   . Dyspnea    a. CP and SOB 06/2012 => Ticagrelor changed to Plavix  . History of Clostridium difficile infection   . HLD (hyperlipidemia)    intolerant to statins  . Hx of adenomatous colonic polyps   . Hx of echocardiogram    a. Echo 2/14:  mild LVH, EF 55-65%, Gr 1 diast dysfn, mild LAE  . Hypertension   . Idiopathic pulmonary fibrosis   . Internal hemorrhoids   .  Lung nodules   . Microscopic colitis   . Myocardial infarction ([place]) 2013  . Obesity   . OSA (obstructive sleep apnea)    a. on CPAP  . Sleep apnea    cpap    Past Surgical History:  Procedure Laterality Date  . APPENDECTOMY  1994  . BREAST BIOPSY  1965, 1975   bilateral  . CARDIAC CATHETERIZATION     x4  . CHOLECYSTECTOMY  2004  . CORONARY STENT PLACEMENT  04/2012   x2  . KNEE ARTHROSCOPY  2000   bilateral  . LEFT AND RIGHT HEART CATHETERIZATION WITH CORONARY ANGIOGRAM N/A 08/02/2014   Procedure:  LEFT AND RIGHT HEART CATHETERIZATION WITH CORONARY ANGIOGRAM;  Surgeon: Burnell Blanks, MD;  Location: [facility] C[facility] LAB;  Service: Cardiovascular;  Laterality: N/A;  . LEFT HEART CATHETERIZATION WITH CORONARY ANGIOGRAM N/A 04/29/2012   Procedure: LEFT HEART CATHETERIZATION WITH CORONARY ANGIOGRAM;  Surgeon: Peter M Martinique, MD;  Location: [facility] CATH LAB;  Service: Cardiovascular;  Laterality: N/A;  . LEFT HEART CATHETERIZATION WITH CORONARY ANGIOGRAM N/A 08/03/2012   Procedure: LEFT HEART CATHETERIZATION WITH CORONARY ANGIOGRAM;  Surgeon: Burnell Blanks, MD;  Location: [facility] CATH LAB;  Service: Cardiovascular;  Laterality: N/A;  . LEFT HEART CATHETERIZATION WITH CORONARY ANGIOGRAM N/A 04/12/2013   Procedure: LEFT HEART CATHETERIZATION WITH CORONARY ANGIOGRAM;  Surgeon: Peter M Martinique, MD;  Location: [facility] CATH LAB;  Service: Cardiovascular;  Laterality: N/A;  . PERCUTANEOUS CORONARY STENT INTERVENTION (PCI-S) N/A 04/29/2012   Procedure: PERCUTANEOUS CORONARY STENT INTERVENTION (PCI-S);  Surgeon: Peter M Martinique, MD;  Location: [facility] CATH LAB;  Service: Cardiovascular;  Laterality: N/A;  . SHOULDER ARTHROSCOPY  2012   left  . vaginal polypectomy  2004  . VENTRAL HERNIA REPAIR  2005    Current Medications: Outpatient Medications Prior to Visit  Medication Sig Dispense Refill  . amLODipine (NORVASC) 5 MG tablet Take 5 mg by mouth daily.    Marland Kitchen aspirin EC 81 MG tablet Take 81 mg by mouth daily.    . cyclophosphamide (CYTOXAN) 25 MG capsule Take 3 capsules (75 mg total) by mouth daily. Give on an empty stomach 1 hour before or 2 hours after meals. 90 capsule 0  . dicyclomine (BENTYL) 10 MG capsule Take 1 capsule (10 mg total) by mouth 3 (three) times daily as needed for spasms. 60 capsule 0  . famotidine (PEPCID) 20 MG tablet Take 1 tablet (20 mg total) by mouth 2 (two) times daily. 60 tablet 0  . isosorbide mononitrate (IMDUR) 60 MG 24 hr tablet Take 60 mg by mouth at bedtime.    . metoprolol tartrate  (LOPRESSOR) 25 MG tablet Take 1 tablet (25 mg total) by mouth 3 (three) times daily. 120 tablet 0  . nitroGLYCERIN (NITROSTAT) 0.4 MG SL tablet Place 1 tablet (0.4 mg total) under the tongue every 5 (five) minutes x 3 doses as needed for chest pain. 25 tablet 12  . ondansetron (ZOFRAN) 4 MG tablet Take 1 tablet (4 mg total) by mouth every 8 (eight) hours as needed for nausea or vomiting. 30 tablet 1  . predniSONE (DELTASONE) 20 MG tablet Take 3 tablets (60 mg total) by mouth daily with breakfast. 90 tablet 0  . saccharomyces boulardii (FLORASTOR) 250 MG capsule Take 1 capsule (250 mg total) by mouth 2 (two) times daily. 30 capsule 0  . warfarin (COUMADIN) 2.5 MG tablet Take 1 tablet (2.5 mg total) by mouth daily. 30 tablet 1  . magnesium oxide (MAG-OX) 400 (241.3 Mg)  MG tablet Take 1 tablet (400 mg total) by mouth 2 (two) times daily. 10 tablet 0  . sucralfate (CARAFATE) 1 GM/10ML suspension Take 10 mLs (1 g total) by mouth 2 (two) times daily after a meal. (Patient taking differently: Take 1 g by mouth 2 (two) times daily as needed. ) 420 mL 0   No facility-administered medications prior to visit.      Allergies:   Brilinta [ticagrelor]; Lisinopril; Statins; Zetia [ezetimibe]; Macrodantin [nitrofurantoin macrocrystal]; and Sulfa antibiotics   Social History   Social History  . Marital status: Married    Spouse name: N/A  . Number of children: 3  . Years of education: N/A   Occupational History  . Retired Therapist, sports Retired   Social History Main Topics  . Smoking status: Never Smoker  . Smokeless tobacco: Never Used     Comment: second hand smoke   . Alcohol use No  . Drug use: No  . Sexual activity: No   Other Topics Concern  . None   Social History Narrative  . None     Family History:  The patient's family history includes Breast cancer in her sister; Colon cancer (age of onset: 52) in her sister; Colon polyps in her father; Heart disease (age of onset: 16) in her brother; Heart  disease (age of onset: 65) in her mother; Heart disease (age of onset: 64) in her father; Kidney disease in her father; Prostate cancer in her father.   ROS:   Please see the history of present illness.    ROS All other systems reviewed and are negative.   PHYSICAL EXAM:   VS:  BP (!) 150/78   Pulse (!) 58   Ht 5\' 7"  (1.702 m)   Wt 191 lb 3.2 oz (86.7 kg)   LMP  (LMP Unknown)   BMI 29.95 kg/m    GEN: Well nourished, well developed, in no acute distress  HEENT: normal  Neck: no JVD, carotid bruits, or masses Cardiac: RRR; no murmurs, rubs, or gallops,no edema  Respiratory:  clear to auscultation bilaterally, normal work of breathing GI: soft, nontender, nondistended, + BS MS: no deformity or atrophy  Skin: warm and dry, no rash Neuro:  Alert and Oriented x 3, Strength and sensation are intact Psych: euthymic mood, full affect  Wt Readings from Last 3 Encounters:  02/06/17 192 lb (87.1 kg)  02/05/17 191 lb 3.2 oz (86.7 kg)  01/22/17 203 lb 6.4 oz (92.3 kg)      Studies/Labs Reviewed:   EKG:  EKG is ordered today.  The ekg ordered today demonstrates Sinus bradycardia, heart rate 50s, PVCs.  Recent Labs: 11/05/2016: B Natriuretic Peptide 65.3 01/18/2017: TSH 1.242 01/21/2017: Platelets 261 01/22/2017: ALT 24; BUN 65; Creatinine, Ser 3.22; Hemoglobin 10.8; Magnesium 1.6; Potassium 3.8; Sodium 142   Lipid Panel    Component Value Date/Time   CHOL 178 11/06/2016 0034   TRIG 152 (H) 11/06/2016 0034   HDL 34 (L) 11/06/2016 0034   CHOLHDL 5.2 11/06/2016 0034   VLDL 30 11/06/2016 0034   LDLCALC 114 (H) 11/06/2016 0034    Additional studies/ records that were reviewed today include:   Myoview 11/06/2016  Horizontal ST segment depression ST segment depression of 0.3 mm was noted during stress in the I and II leads.  This is a low risk study.  The left ventricular ejection fraction is hyperdynamic (>65%).   1. Nonspecific changes on stress ECG.  2. Primarily fixed small,  mild basal inferolateral perfusion  defect. Primarily fixed medium sized, moderate intensity apical septal/apical anterior/apical perfusion defect.  No evidence for significant ischemia.  3. EF 66%, normal wall motion.   Prominent breast shadow.  With fixed defects and normal wall motion, it is certainly possible that the abnormalities may be due to attenuation.  There does not appear to be significant ischemia.  Overall I think this is low risk.    Echo 01/19/2017 LV EF: 60% -   65%  ------------------------------------------------------------------- Study Conclusions  - Left ventricle: The cavity size was normal. Systolic function was   normal. The estimated ejection fraction was in the range of 60%   to 65%. Wall motion was normal; there were no regional wall   motion abnormalities. There was an increased relative   contribution of atrial contraction to ventricular filling.   Doppler parameters are consistent with abnormal left ventricular   relaxation (grade 1 diastolic dysfunction). - Mitral valve: Calcified annulus. There was mild regurgitation. - Pulmonary arteries: Systolic pressure could not be accurately   estimated.    ASSESSMENT:    1. PAF (paroxysmal atrial fibrillation) ([place])   2. Hypertension, unspecified type   3. Acute on chronic renal insufficiency   4. Coronary artery disease involving native coronary artery of native heart without angina pectoris   5. Hyperlipidemia, unspecified hyperlipidemia type   6. OSA on CPAP      PLAN:  In order of problems listed above:  1. PAF: Maintain on current metoprolol 25 mg 3 times a day with instruction of 25 mg additional tablet as needed for recurrent atrial fibrillation. She has some underlying tachybradycardia syndrome. In the hospital she had a 3.3 second posttermination pause. Her heart rate seems to be maintained quite well on the current dose of beta blocker. However at some point, she may end up needing a  pacemaker. She has been started on Coumadin, INRs therapeutic  2. Hypertension: Blood pressure elevated today, however according to the patient sometimes her blood pressure gets down to the 80s at home, she will need to keep a blood pressure diary for the time being.  3. Acute on chronic renal insufficiency: She has upcoming nephrology visit.  4. CAD: Negative Myoview in May 2018  5. Hyperlipidemia: Intolerant to multiple statins    Medication Adjustments/Labs and Tests Ordered: Current medicines are reviewed at length with the patient today.  Concerns regarding medicines are outlined above.  Medication changes, Labs and Tests ordered today are listed in the Patient Instructions below. Patient Instructions  Isaac Laud, [place] has recommended that you monitor home blood pressures twice daily  Your physician recommends that you schedule a follow-up appointment in: TWO MONTHS with Dr. Martinique     Signed, Almyra Deforest, [place]  02/06/2017 10:56 PM    [address], [place], [place]  [address] Phone: [phone]; Fax: [phone]

## 2017-02-06 ENCOUNTER — Encounter: Payer: Self-pay | Admitting: Physician Assistant

## 2017-02-06 ENCOUNTER — Encounter: Payer: Self-pay | Admitting: Gastroenterology

## 2017-02-06 ENCOUNTER — Ambulatory Visit (INDEPENDENT_AMBULATORY_CARE_PROVIDER_SITE_OTHER): Payer: Medicare Other | Admitting: Gastroenterology

## 2017-02-06 VITALS — BP 116/74 | HR 75 | Ht 67.0 in | Wt 192.0 lb

## 2017-02-06 DIAGNOSIS — K219 Gastro-esophageal reflux disease without esophagitis: Secondary | ICD-10-CM | POA: Diagnosis not present

## 2017-02-06 DIAGNOSIS — R1013 Epigastric pain: Secondary | ICD-10-CM | POA: Diagnosis not present

## 2017-02-06 DIAGNOSIS — I2510 Atherosclerotic heart disease of native coronary artery without angina pectoris: Secondary | ICD-10-CM

## 2017-02-06 NOTE — Progress Notes (Addendum)
02/06/2017 Jody Ruiz [id number] 09/29/1940   HISTORY OF PRESENT ILLNESS:  This is a 76 year old female who is previously known to Dr. Henrene Pastor, but has switched her care to Dr. Hilarie Fredrickson. She is known here for issues with microscopic colitis, fairly recent episode of C. difficile, and complaints of GERD/dyspepsia. 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 fib with RVR for which she is on Coumadin. From a GI standpoint she is feeling well. She is using Pepcid twice daily and is still on Florastor probiotic, but otherwise she only has other medications as needed and she has not needed to take them since her hospitalization. She says that she has not had any diarrhea, if anything she has had more constipation and actually took a dose of MiraLAX one day.   Past Medical History:  Diagnosis Date  . Arthritis   . Atrial fibrillation ([place])   . Borderline hypothyroidism   . CAD (coronary artery disease)    a. 04/2012 NSTEMI/Cath:  pLAD 90%, mLAD 70-80% (long) (3x16 & 2.5x28 Promus DES to p/m LAD), pD1 70-80%, pRI 30% (small), CFX 20%, OM1 40%, mOM2 30%, pRCA 30%, PDA 30-40%, pPLB 70%, then mid 90%, EF 65%;  b. 03/2013 Abnl CL w/apical isch;  c. 04/2013 Cath: LM nl, LAD patent stents, LCX <20, RCA 30p PDA 40-50 EF 55-60% ->Cont Med Rx.  c. CP s/p LHC with stable dz and patent stents--> Rx medically   . Cataract   . Dyspnea    a. CP and SOB 06/2012 => Ticagrelor changed to Plavix  . History of Clostridium difficile infection   . HLD (hyperlipidemia)    intolerant to statins  . Hx of adenomatous colonic polyps   . Hx of echocardiogram    a. Echo 2/14:  mild LVH, EF 55-65%, Gr 1 diast dysfn, mild LAE  . Hypertension   . Idiopathic pulmonary fibrosis   . Internal hemorrhoids   . Lung nodules   . Microscopic colitis   . Myocardial infarction  ([place]) 2013  . Obesity   . OSA (obstructive sleep apnea)    a. on CPAP  . Sleep apnea    cpap   Past Surgical History:  Procedure Laterality Date  . APPENDECTOMY  1994  . BREAST BIOPSY  1965, 1975   bilateral  . CARDIAC CATHETERIZATION     x4  . CHOLECYSTECTOMY  2004  . CORONARY STENT PLACEMENT  04/2012   x2  . KNEE ARTHROSCOPY  2000   bilateral  . LEFT AND RIGHT HEART CATHETERIZATION WITH CORONARY ANGIOGRAM N/A 08/02/2014   Procedure: LEFT AND RIGHT HEART CATHETERIZATION WITH CORONARY ANGIOGRAM;  Surgeon: Burnell Blanks, MD;  Location: [facility] CATH LAB;  Service: Cardiovascular;  Laterality: N/A;  . LEFT HEART CATHETERIZATION WITH CORONARY ANGIOGRAM N/A 04/29/2012   Procedure: LEFT HEART CATHETERIZATION WITH CORONARY ANGIOGRAM;  Surgeon: Peter M Martinique, MD;  Location: [facility] CATH LAB;  Service: Cardiovascular;  Laterality: N/A;  . LEFT HEART CATHETERIZATION WITH CORONARY ANGIOGRAM N/A 08/03/2012   Procedure: LEFT HEART CATHETERIZATION WITH CORONARY ANGIOGRAM;  Surgeon: Burnell Blanks, MD;  Location: [facility] CATH LAB;  Service: Cardiovascular;  Laterality: N/A;  . LEFT HEART CATHETERIZATION WITH CORONARY ANGIOGRAM N/A 04/12/2013   Procedure: LEFT HEART CATHETERIZATION WITH CORONARY ANGIOGRAM;  Surgeon: Peter M Martinique, MD;  Location: [facility] CATH LAB;  Service: Cardiovascular;  Laterality:  N/A;  . PERCUTANEOUS CORONARY STENT INTERVENTION (PCI-S) N/A 04/29/2012   Procedure: PERCUTANEOUS CORONARY STENT INTERVENTION (PCI-S);  Surgeon: Peter M Martinique, MD;  Location: [facility] CATH LAB;  Service: Cardiovascular;  Laterality: N/A;  . SHOULDER ARTHROSCOPY  2012   left  . vaginal polypectomy  2004  . VENTRAL HERNIA REPAIR  2005    reports that she has never smoked. She has never used smokeless tobacco. She reports that she does not drink alcohol or use drugs. family history includes Breast cancer in her sister; Colon cancer (age of onset: 24) in her sister; Colon polyps in her father; Heart disease (age  of onset: 46) in her brother; Heart disease (age of onset: 47) in her mother; Heart disease (age of onset: 59) in her father; Kidney disease in her father; Prostate cancer in her father. Allergies  Allergen Reactions  . Brilinta [Ticagrelor] Shortness Of Breath, Other (See Comments) and Cough    Reaction:  Fatigue  . Lisinopril Shortness Of Breath and Cough  . Statins Other (See Comments)    Reaction:  Muscle pain/weakness  . Zetia [Ezetimibe] Other (See Comments)    Reaction:  Muscle pain/weakness   . Macrodantin [Nitrofurantoin Macrocrystal] Rash  . Sulfa Antibiotics Rash      Outpatient Encounter Prescriptions as of 02/06/2017  Medication Sig  . amLODipine (NORVASC) 5 MG tablet Take 5 mg by mouth daily.  Marland Kitchen aspirin EC 81 MG tablet Take 81 mg by mouth daily.  . cyclophosphamide (CYTOXAN) 25 MG capsule Take 3 capsules (75 mg total) by mouth daily. Give on an empty stomach 1 hour before or 2 hours after meals.  . dicyclomine (BENTYL) 10 MG capsule Take 1 capsule (10 mg total) by mouth 3 (three) times daily as needed for spasms.  . famotidine (PEPCID) 20 MG tablet Take 1 tablet (20 mg total) by mouth 2 (two) times daily.  . isosorbide mononitrate (IMDUR) 60 MG 24 hr tablet Take 60 mg by mouth at bedtime.  . metoprolol tartrate (LOPRESSOR) 25 MG tablet Take 1 tablet (25 mg total) by mouth 3 (three) times daily.  . nitroGLYCERIN (NITROSTAT) 0.4 MG SL tablet Place 1 tablet (0.4 mg total) under the tongue every 5 (five) minutes x 3 doses as needed for chest pain.  Marland Kitchen ondansetron (ZOFRAN) 4 MG tablet Take 1 tablet (4 mg total) by mouth every 8 (eight) hours as needed for nausea or vomiting.  . predniSONE (DELTASONE) 20 MG tablet Take 3 tablets (60 mg total) by mouth daily with breakfast.  . saccharomyces boulardii (FLORASTOR) 250 MG capsule Take 1 capsule (250 mg total) by mouth 2 (two) times daily.  . sucralfate (CARAFATE) 1 GM/10ML suspension Take 1 g by mouth 2 (two) times daily as needed.  .  warfarin (COUMADIN) 2.5 MG tablet Take 1 tablet (2.5 mg total) by mouth daily.   No facility-administered encounter medications on file as of 02/06/2017.      REVIEW OF SYSTEMS  : All other systems reviewed and negative except where noted in the History of Present Illness.   PHYSICAL EXAM: BP 116/74   Pulse 75   Ht 5\' 7"  (1.702 m)   Wt 192 lb (87.1 kg)   LMP  (LMP Unknown)   BMI 30.07 kg/m  General: Well developed white female in no acute distress Head: Normocephalic and atraumatic Eyes:  Sclerae anicteric, conjunctiva pink. Ears: Normal auditory acuity Lungs: Clear throughout to auscultation; no increased WOB. Heart: Regular rate and rhythm; no M/R/G. Abdomen: Soft, non-distended.  BS present.  Non-tender. Musculoskeletal: Symmetrical with no gross deformities  Skin: No lesions on visible extremities Extremities: No edema  Neurological: Alert oriented x 4, grossly non-focal. Psychological:  Alert and cooperative. Normal mood and affect  ASSESSMENT AND PLAN: *76 y/o female with history of microscopic colitis / C diff, dyspepsia / GERD, admitted with AKI - ? Glomerulonephritis--now on Cytoxan and prednisone.  Also with new issues of AF with RVR--on coumadin.  She is feeling well from a GI standpoint.  Still on pepcid BID and florastor daily, but all other GI meds only prn and she has not needed them.  I think that a lot of her nausea and dyspepsia complaints could have been related to her kidney injury.  She will continue her meds as she is taking them currently, but I think that she could discontinue the florastor at this point if she desires to do so.  Having more constipation rather than diarrhea so could consider daily MiraLAX if needed. She will follow-up with Dr. Hilarie Fredrickson in 8-12 weeks.   CC:  Burnard Bunting, MD  Addendum: Reviewed and agree with management. Pyrtle, Lajuan Lines, MD

## 2017-02-10 ENCOUNTER — Ambulatory Visit (INDEPENDENT_AMBULATORY_CARE_PROVIDER_SITE_OTHER): Payer: Medicare Other | Admitting: Pharmacist Clinician (PhC)/ Clinical Pharmacy Specialist

## 2017-02-10 DIAGNOSIS — Z7901 Long term (current) use of anticoagulants: Secondary | ICD-10-CM

## 2017-02-10 DIAGNOSIS — I4891 Unspecified atrial fibrillation: Secondary | ICD-10-CM

## 2017-02-10 DIAGNOSIS — I2510 Atherosclerotic heart disease of native coronary artery without angina pectoris: Secondary | ICD-10-CM | POA: Diagnosis not present

## 2017-02-10 LAB — POCT INR: INR: 2.9

## 2017-02-13 DIAGNOSIS — Z9189 Other specified personal risk factors, not elsewhere classified: Secondary | ICD-10-CM | POA: Diagnosis not present

## 2017-02-13 DIAGNOSIS — R531 Weakness: Secondary | ICD-10-CM | POA: Diagnosis not present

## 2017-02-13 DIAGNOSIS — I7789 Other specified disorders of arteries and arterioles: Secondary | ICD-10-CM | POA: Diagnosis not present

## 2017-02-13 DIAGNOSIS — D631 Anemia in chronic kidney disease: Secondary | ICD-10-CM | POA: Diagnosis not present

## 2017-02-13 DIAGNOSIS — N189 Chronic kidney disease, unspecified: Secondary | ICD-10-CM | POA: Diagnosis not present

## 2017-02-13 DIAGNOSIS — E669 Obesity, unspecified: Secondary | ICD-10-CM | POA: Diagnosis not present

## 2017-02-13 DIAGNOSIS — N059 Unspecified nephritic syndrome with unspecified morphologic changes: Secondary | ICD-10-CM | POA: Diagnosis not present

## 2017-02-16 ENCOUNTER — Ambulatory Visit (INDEPENDENT_AMBULATORY_CARE_PROVIDER_SITE_OTHER): Payer: Medicare Other | Admitting: Pharmacist Clinician (PhC)/ Clinical Pharmacy Specialist

## 2017-02-16 DIAGNOSIS — K589 Irritable bowel syndrome without diarrhea: Secondary | ICD-10-CM | POA: Diagnosis not present

## 2017-02-16 DIAGNOSIS — Z6828 Body mass index (BMI) 28.0-28.9, adult: Secondary | ICD-10-CM | POA: Diagnosis not present

## 2017-02-16 DIAGNOSIS — E784 Other hyperlipidemia: Secondary | ICD-10-CM | POA: Diagnosis not present

## 2017-02-16 DIAGNOSIS — I2510 Atherosclerotic heart disease of native coronary artery without angina pectoris: Secondary | ICD-10-CM

## 2017-02-16 DIAGNOSIS — M1990 Unspecified osteoarthritis, unspecified site: Secondary | ICD-10-CM | POA: Diagnosis not present

## 2017-02-16 DIAGNOSIS — Z7901 Long term (current) use of anticoagulants: Secondary | ICD-10-CM

## 2017-02-16 DIAGNOSIS — I10 Essential (primary) hypertension: Secondary | ICD-10-CM | POA: Diagnosis not present

## 2017-02-16 DIAGNOSIS — E038 Other specified hypothyroidism: Secondary | ICD-10-CM | POA: Diagnosis not present

## 2017-02-16 DIAGNOSIS — I4891 Unspecified atrial fibrillation: Secondary | ICD-10-CM | POA: Diagnosis not present

## 2017-02-16 DIAGNOSIS — Z1389 Encounter for screening for other disorder: Secondary | ICD-10-CM | POA: Diagnosis not present

## 2017-02-16 DIAGNOSIS — N281 Cyst of kidney, acquired: Secondary | ICD-10-CM | POA: Diagnosis not present

## 2017-02-16 DIAGNOSIS — J84112 Idiopathic pulmonary fibrosis: Secondary | ICD-10-CM | POA: Diagnosis not present

## 2017-02-16 DIAGNOSIS — R7302 Impaired glucose tolerance (oral): Secondary | ICD-10-CM | POA: Diagnosis not present

## 2017-02-16 LAB — POCT INR: INR: 4.3

## 2017-02-18 ENCOUNTER — Encounter (HOSPITAL_COMMUNITY): Payer: Self-pay

## 2017-02-18 ENCOUNTER — Emergency Department (HOSPITAL_COMMUNITY)
Admission: EM | Admit: 2017-02-18 | Discharge: 2017-02-18 | Disposition: A | Discharge status: Home / Self Care | Payer: Medicare Other | Attending: Emergency Medicine | Admitting: Emergency Medicine

## 2017-02-18 DIAGNOSIS — I2510 Atherosclerotic heart disease of native coronary artery without angina pectoris: Secondary | ICD-10-CM | POA: Diagnosis not present

## 2017-02-18 DIAGNOSIS — Z7901 Long term (current) use of anticoagulants: Secondary | ICD-10-CM | POA: Insufficient documentation

## 2017-02-18 DIAGNOSIS — I10 Essential (primary) hypertension: Secondary | ICD-10-CM | POA: Diagnosis not present

## 2017-02-18 DIAGNOSIS — E785 Hyperlipidemia, unspecified: Secondary | ICD-10-CM | POA: Diagnosis not present

## 2017-02-18 DIAGNOSIS — I4891 Unspecified atrial fibrillation: Secondary | ICD-10-CM | POA: Diagnosis not present

## 2017-02-18 DIAGNOSIS — Z79899 Other long term (current) drug therapy: Secondary | ICD-10-CM | POA: Insufficient documentation

## 2017-02-18 DIAGNOSIS — R112 Nausea with vomiting, unspecified: Secondary | ICD-10-CM | POA: Diagnosis not present

## 2017-02-18 DIAGNOSIS — R0789 Other chest pain: Secondary | ICD-10-CM | POA: Diagnosis not present

## 2017-02-18 DIAGNOSIS — I252 Old myocardial infarction: Secondary | ICD-10-CM | POA: Insufficient documentation

## 2017-02-18 DIAGNOSIS — Z7982 Long term (current) use of aspirin: Secondary | ICD-10-CM | POA: Diagnosis not present

## 2017-02-18 LAB — CBC WITH DIFFERENTIAL/PLATELET
BASOS PCT: 0 %
Basophils Absolute: 0 10*3/uL (ref 0.0–0.1)
EOS ABS: 0 10*3/uL (ref 0.0–0.7)
Eosinophils Relative: 0 %
HEMATOCRIT: 29.3 % — AB (ref 36.0–46.0)
HEMOGLOBIN: 9.6 g/dL — AB (ref 12.0–15.0)
LYMPHS ABS: 1.3 10*3/uL (ref 0.7–4.0)
Lymphocytes Relative: 11 %
MCH: 28.6 pg (ref 26.0–34.0)
MCHC: 32.8 g/dL (ref 30.0–36.0)
MCV: 87.2 fL (ref 78.0–100.0)
MONOS PCT: 8 %
Monocytes Absolute: 0.9 10*3/uL (ref 0.1–1.0)
NEUTROS ABS: 9.2 10*3/uL — AB (ref 1.7–7.7)
NEUTROS PCT: 81 %
Platelets: 214 10*3/uL (ref 150–400)
RBC: 3.36 MIL/uL — AB (ref 3.87–5.11)
RDW: 14.2 % (ref 11.5–15.5)
WBC: 11.3 10*3/uL — AB (ref 4.0–10.5)

## 2017-02-18 LAB — COMPREHENSIVE METABOLIC PANEL
ALBUMIN: 2.8 g/dL — AB (ref 3.5–5.0)
ALK PHOS: 52 U/L (ref 38–126)
ALT: 27 U/L (ref 14–54)
AST: 18 U/L (ref 15–41)
Anion gap: 9 (ref 5–15)
BILIRUBIN TOTAL: 0.9 mg/dL (ref 0.3–1.2)
BUN: 91 mg/dL — AB (ref 6–20)
CO2: 22 mmol/L (ref 22–32)
Calcium: 8.3 mg/dL — ABNORMAL LOW (ref 8.9–10.3)
Chloride: 107 mmol/L (ref 101–111)
Creatinine, Ser: 4.59 mg/dL — ABNORMAL HIGH (ref 0.44–1.00)
GFR calc Af Amer: 10 mL/min — ABNORMAL LOW (ref >60)
GFR calc non Af Amer: 9 mL/min — ABNORMAL LOW (ref >60)
GLUCOSE: 165 mg/dL — AB (ref 65–99)
Potassium: 3.8 mmol/L (ref 3.5–5.1)
SODIUM: 138 mmol/L (ref 135–145)
Total Protein: 5.2 g/dL — ABNORMAL LOW (ref 6.5–8.1)

## 2017-02-18 LAB — I-STAT TROPONIN, ED: TROPONIN I, POC: 0.06 ng/mL (ref 0.00–0.08)

## 2017-02-18 LAB — LIPASE, BLOOD: Lipase: 141 U/L — ABNORMAL HIGH (ref 11–51)

## 2017-02-18 LAB — PROTIME-INR
INR: 2.82
Prothrombin Time: 30.3 seconds — ABNORMAL HIGH (ref 11.4–15.2)

## 2017-02-18 MED ORDER — METOPROLOL TARTRATE 25 MG PO TABS
25.0000 mg | ORAL_TABLET | Freq: Once | ORAL | Status: AC
  Administered 2017-02-18: 25 mg via ORAL
  Filled 2017-02-18: qty 1

## 2017-02-18 MED ORDER — SODIUM CHLORIDE 0.9 % IV BOLUS (SEPSIS)
1000.0000 mL | Freq: Once | INTRAVENOUS | Status: AC
  Administered 2017-02-18: 1000 mL via INTRAVENOUS

## 2017-02-18 MED ORDER — SODIUM CHLORIDE 0.9 % IV BOLUS (SEPSIS)
500.0000 mL | Freq: Once | INTRAVENOUS | Status: AC
  Administered 2017-02-18: 500 mL via INTRAVENOUS

## 2017-02-18 NOTE — ED Provider Notes (Signed)
[place] DEPT Provider Note   CSN: [id number] Arrival date & time: 02/18/17  1122     History   Chief Complaint Chief Complaint  Patient presents with  . Nausea  . Emesis    HPI Jody Ruiz is a 76 y.o. female.  The history is provided by the patient and medical records.  Emesis   Associated symptoms include diarrhea.    76 year old female with history of paroxysmal A. 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 Patient reports this seems to have been a recurrent issue for her over the past few months with all of her new medications. States her overall health has gone downhill since her hospitalization last month.  States last night she began having recurrent nausea, vomiting, diarrhea. No bloody or dark stools. No hematemesis. States today she began dry heaving and felt she was getting dehydrated so she called EMS. She was given 500 mL fluid and Zofran and route which resolved her nausea and vomiting. States currently now she just feels very "dry" and dehydrated. She denies any current abdominal pain. No chest pain or shortness of breath. EMS reports patient was going in and out of A. Fib during transport here. Patient denies any current palpitations. She did not get to take her morning medications yet today due to her vomiting.  Past Medical History:  Diagnosis Date  . Arthritis   . Atrial fibrillation ([place])   . Borderline hypothyroidism   . CAD (coronary artery disease)    a. 04/2012 NSTEMI/Cath:  pLAD 90%, mLAD 70-80% (long) (3x16 & 2.5x28 Promus DES to p/m LAD), pD1 70-80%, pRI 30% (small), CFX 20%, OM1 40%, mOM2 30%, pRCA 30%, PDA 30-40%, pPLB 70%, then mid 90%, EF 65%;  b. 03/2013 Abnl CL w/apical isch;  c. 04/2013 Cath: LM nl, LAD patent stents, LCX <20, RCA 30p PDA 40-50 EF 55-60% ->Cont Med Rx.  c. CP s/p LHC  with stable dz and patent stents--> Rx medically   . Cataract   . Dyspnea    a. CP and SOB 06/2012 => Ticagrelor changed to Plavix  . History of Clostridium difficile infection   . HLD (hyperlipidemia)    intolerant to statins  . Hx of adenomatous colonic polyps   . Hx of echocardiogram    a. Echo 2/14:  mild LVH, EF 55-65%, Gr 1 diast dysfn, mild LAE  . Hypertension   . Idiopathic pulmonary fibrosis   . Internal hemorrhoids   . Lung nodules   . Microscopic colitis   . Myocardial infarction ([place]) 2013  . Obesity   . OSA (obstructive sleep apnea)    a. on CPAP  . Sleep apnea    cpap    Patient Active Problem List   Diagnosis Date Noted  . Long term (current) use of anticoagulants [Z79.01] 02/02/2017  . SOB (shortness of breath)   . Atrial fibrillation with RVR ([place])   . Glomerulonephritis   . Loose stools   . History of Clostridium difficile colitis   . AKI (acute kidney injury) ([place]) 01/14/2017  . Dyspepsia   . Acute kidney injury superimposed on chronic kidney disease ([place]) 01/13/2017  . H/O umbilical hernia repair 70/17/7939  . Belching 01/02/2017  . Gastroesophageal reflux disease 01/02/2017  . Right sided abdominal pain 01/02/2017  . Palpitations   . Tachycardia-bradycardia syndrome ([place])   . Chest pain 11/05/2016  . Renal insufficiency  10/16/2016  . B12 deficiency 10/16/2016  . Enteritis due to Clostridium difficile   . Diarrhea with dehydration 10/14/2016  . Microscopic colitis 10/02/2016  . Hx of adenomatous colonic polyps 10/02/2016  . Altered mental status   . CAD (coronary artery disease)   . Interstitial lung disease ([place])   . HLD (hyperlipidemia)   . Precordial pain 07/17/2013  . Midsternal chest pain 07/17/2013  . Pulmonary nodule 11/22/2012  . 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 OSA (obstructive sleep apnea) 10/05/2012  . IPF (idiopathic pulmonary fibrosis) ([place]) 08/26/2012  . Coronary atherosclerosis of native coronary artery 05/04/2012    . Hypertension   . Obesity     Past Surgical History:  Procedure Laterality Date  . APPENDECTOMY  1994  . BREAST BIOPSY  1965, 1975   bilateral  . CARDIAC CATHETERIZATION     x4  . CHOLECYSTECTOMY  2004  . CORONARY STENT PLACEMENT  04/2012   x2  . KNEE ARTHROSCOPY  2000   bilateral  . LEFT AND RIGHT HEART CATHETERIZATION WITH CORONARY ANGIOGRAM N/A 08/02/2014   Procedure: LEFT AND RIGHT HEART CATHETERIZATION WITH CORONARY ANGIOGRAM;  Surgeon: Burnell Blanks, MD;  Location: [facility] CATH LAB;  Service: Cardiovascular;  Laterality: N/A;  . LEFT HEART CATHETERIZATION WITH CORONARY ANGIOGRAM N/A 04/29/2012   Procedure: LEFT HEART CATHETERIZATION WITH CORONARY ANGIOGRAM;  Surgeon: Peter M Martinique, MD;  Location[facility] CATH LAB;  Service: Cardiovascular;  Laterality: N/A;  . LEFT HEART CATHETERIZATION WITH CORONARY ANGIOGRAM N/A 08/03/2012   Procedure: LEFT HEART CATHETERIZATION WITH CORONARY ANGIOGRAM;  Surgeon: Burnell Blanks, MD;  Location: [facility] CATH LAB;  Service: Cardiovascular;  Laterality: N/A;  . LEFT HEART CATHETERIZATION WITH CORONARY ANGIOGRAM N/A 04/12/2013   Procedure: LEFT HEART CATHETERIZATION WITH CORONARY ANGIOGRAM;  Surgeon: Peter M Martinique, MD;  Location: [facility] CATH LAB;  Service: Cardiovascular;  Laterality: N/A;  . PERCUTANEOUS CORONARY STENT INTERVENTION (PCI-S) N/A 04/29/2012   Procedure: PERCUTANEOUS CORONARY STENT INTERVENTION (PCI-S);  Surgeon: Peter M Martinique, MD;  Location: [facility] CATH LAB;  Service: Cardiovascular;  Laterality: N/A;  . SHOULDER ARTHROSCOPY  2012   left  . vaginal polypectomy  2004  . VENTRAL HERNIA REPAIR  2005    OB History    No data available       Home Medications    Prior to Admission medications   Medication Sig Start Date End Date Taking? Authorizing Provider  amLODipine (NORVASC) 5 MG tablet Take 5 mg by mouth daily.    [provider]  aspirin EC 81 MG tablet Take 81 mg by mouth daily.    [provider]   cyclophosphamide (CYTOXAN) 25 MG capsule Take 3 capsules (75 mg total) by mouth daily. Give on an empty stomach 1 hour before or 2 hours after meals. 01/22/17   Regalado, Belkys A, MD  dapsone 100 MG tablet Take 100 mg by mouth daily.    [provider]  dicyclomine (BENTYL) 10 MG capsule Take 1 capsule (10 mg total) by mouth 3 (three) times daily as needed for spasms. 01/21/17   Regalado, Belkys A, MD  famotidine (PEPCID) 20 MG tablet Take 1 tablet (20 mg total) by mouth 2 (two) times daily. 01/21/17   Regalado, Belkys A, MD  isosorbide mononitrate (IMDUR) 60 MG 24 hr tablet Take 60 mg by mouth at bedtime.    [provider]  metoprolol tartrate (LOPRESSOR) 25 MG tablet Take 1 tablet (25 mg total) by mouth 3 (three) times daily. 01/22/17  Regalado, Belkys A, MD  nitroGLYCERIN (NITROSTAT) 0.4 MG SL tablet Place 1 tablet (0.4 mg total) under the tongue every 5 (five) minutes x 3 doses as needed for chest pain. 08/03/14   Eileen Stanford, PA-C  ondansetron (ZOFRAN) 4 MG tablet Take 1 tablet (4 mg total) by mouth every 8 (eight) hours as needed for nausea or vomiting. 01/08/17   Irene Shipper, MD  predniSONE (DELTASONE) 20 MG tablet Take 3 tablets (60 mg total) by mouth daily with breakfast. 01/22/17   Regalado, Jerald Kief A, MD  saccharomyces boulardii (FLORASTOR) 250 MG capsule Take 1 capsule (250 mg total) by mouth 2 (two) times daily. 01/21/17   Regalado, Belkys A, MD  sucralfate (CARAFATE) 1 GM/10ML suspension Take 1 g by mouth 2 (two) times daily as needed.    [provider]  warfarin (COUMADIN) 2.5 MG tablet Take 1 tablet (2.5 mg total) by mouth daily. 01/28/17   Martinique, Peter M, MD    Family History Family History  Problem Relation Age of Onset  . Breast cancer Sister   . Prostate cancer Father   . Colon polyps Father   . Heart disease Father 42       CAD, died of ESRD  . Kidney disease Father   . Colon cancer Sister 61  . Heart disease Mother 57       Died acute MI   . Heart disease Brother 16       CABG  . Esophageal cancer Neg Hx   . Rectal cancer Neg Hx   . Stomach cancer Neg Hx     Social History Social History  Substance Use Topics  . Smoking status: Never Smoker  . Smokeless tobacco: Never Used     Comment: second hand smoke   . Alcohol use No     Allergies   Brilinta [ticagrelor]; Lisinopril; Statins; Zetia [ezetimibe]; Macrodantin [nitrofurantoin macrocrystal]; and Sulfa antibiotics   Review of Systems Review of Systems  Gastrointestinal: Positive for diarrhea, nausea and vomiting.  All other systems reviewed and are negative.    Physical Exam Updated Vital Signs BP 114/68 (BP Location: Right Arm)   Temp 97.8 F (36.6 C) (Oral)   Resp 14   Ht 5\' 7"  (1.702 m)   Wt 87.1 kg (192 lb)   LMP  (LMP Unknown)   SpO2 98%   BMI 30.07 kg/m   Physical Exam  Constitutional: She is oriented to person, place, and time. She appears well-developed and well-nourished.  HENT:  Head: Normocephalic and atraumatic.  Mouth/Throat: Oropharynx is clear and moist.  Dry mucous membranes  Eyes: Pupils are equal, round, and reactive to light. Conjunctivae and EOM are normal.  Neck: Normal range of motion.  Cardiovascular: Normal heart sounds.  An irregularly irregular rhythm present. Tachycardia present.   AFIB, rate 110-120 during exam  Pulmonary/Chest: Effort normal and breath sounds normal.  Abdominal: Soft. Bowel sounds are normal. There is no tenderness. There is no rigidity and no guarding.  Soft, non-tender, no peritoneal signs  Musculoskeletal: Normal range of motion.  Neurological: She is alert and oriented to person, place, and time.  Skin: Skin is warm and dry.  Psychiatric: She has a normal mood and affect.  Nursing note and vitals reviewed.    ED Treatments / Results  Labs (all labs ordered are listed, but only abnormal results are displayed) Labs Reviewed  CBC WITH DIFFERENTIAL/PLATELET - Abnormal; Notable for the  following:       Result Value  WBC 11.3 (*)    RBC 3.36 (*)    Hemoglobin 9.6 (*)    HCT 29.3 (*)    Neutro Abs 9.2 (*)    All other components within normal limits  COMPREHENSIVE METABOLIC PANEL - Abnormal; Notable for the following:    Glucose, Bld 165 (*)    BUN 91 (*)    Creatinine, Ser 4.59 (*)    Calcium 8.3 (*)    Total Protein 5.2 (*)    Albumin 2.8 (*)    GFR calc non Af Amer 9 (*)    GFR calc Af Amer 10 (*)    All other components within normal limits  LIPASE, BLOOD - Abnormal; Notable for the following:    Lipase 141 (*)    All other components within normal limits  PROTIME-INR - Abnormal; Notable for the following:    Prothrombin Time 30.3 (*)    All other components within normal limits  I-STAT TROPONIN, ED    EKG  EKG Interpretation None       Radiology No results found.  Procedures Procedures (including critical care time)  Medications Ordered in ED Medications  sodium chloride 0.9 % bolus 1,000 mL (0 mLs Intravenous Stopped 02/18/17 1506)  metoprolol tartrate (LOPRESSOR) tablet 25 mg (25 mg Oral Given 02/18/17 1218)  sodium chloride 0.9 % bolus 500 mL (0 mLs Intravenous Stopped 02/18/17 1513)     Initial Impression / Assessment and Plan / ED Course  I have reviewed the triage vital signs and the nursing notes.  Pertinent labs & imaging results that were available during my care of the patient were reviewed by me and considered in my medical decision making (see chart for details).  76 year old female here with GI upset. Reports this seems to be a recurrent issue for her, thinks it is from all of her new medications with new diagnosis of glomerulonephritis.  On arrival to ED, patient reports nausea controlled. She is in A. Fib with a rate of 110's-120's.  Hx of PAF, on coumadin.  Has not yet had her morning meds.  He does appear somewhat clinically dry. We'll plan for screening labs.  Was given half liter NS by EMS, will give additional 1L bolus.   Patient tolerating ice chips at this time.  Patient's labs with serum creatinine of 4.59, BUN 91. Lipase is elevated at 141, however patient without any epigastric pain or abdominal tenderness. Question if this is reactive from her vomiting.  Has had some elevations in the past.  Patient has converted to NSR, now stable in the 80's after lopressor.  Discussed results with patient and daughter at bedside-- state results are fairly similar to recent labs from [place] [facility] drawn last week.  Will speak with nephrology for recommendations.   1400-- called and spoke with patient's nephrologist, Dr. Hollie Salk, in clinic.  States recent lab values with Cr of 4.89, BUN 97 on 02/13/17.  She is pleased with some improvement today.  Recommends additional 500cc fluid to make total of 2L IVF.  She will see in clinic tomorrow for lab re-check and to discuss cytoxan as she feels this is majority of where her GI upset is coming from.    Patient and family updated with this plan of care, they acknowledged understanding and are pleased with this. Patient tolerating PO currently, sandwich and fluids.  Once IVF finishes, can discharge home.  Will have her resume her daily medication regimen once she returns home.  Strict return precautions were  given.  Patient discharged home in stable condition.  Final Clinical Impressions(s) / ED Diagnoses   Final diagnoses:  Non-intractable vomiting with nausea, unspecified vomiting type    New Prescriptions New Prescriptions   No medications on file     Larene Pickett, PA-C 02/18/17 [place]    Larene Pickett, PA-C 02/18/17 [place]    Virgel Manifold, MD 03/06/17 [phone]

## 2017-02-18 NOTE — Discharge Instructions (Signed)
Can continue your home medications this afternoon like we discussed.   Follow-up with Dr. Hollie Salk in clinic tomorrow for repeat labs. Return here for any new/worsening symptoms.

## 2017-02-18 NOTE — ED Triage Notes (Addendum)
Pt brought in by EMS due to having n/v/d since last night. Pt denies bloodly or dark stool.Pt endorses some lightheadedness.Pt received 500cc of NS and 324mg  of aspirin. Pt a&ox4.

## 2017-02-19 DIAGNOSIS — N059 Unspecified nephritic syndrome with unspecified morphologic changes: Secondary | ICD-10-CM | POA: Diagnosis not present

## 2017-02-23 ENCOUNTER — Ambulatory Visit (INDEPENDENT_AMBULATORY_CARE_PROVIDER_SITE_OTHER): Payer: Medicare Other | Admitting: Pharmacist Clinician (PhC)/ Clinical Pharmacy Specialist

## 2017-02-23 DIAGNOSIS — Z7901 Long term (current) use of anticoagulants: Secondary | ICD-10-CM | POA: Diagnosis not present

## 2017-02-23 DIAGNOSIS — I2510 Atherosclerotic heart disease of native coronary artery without angina pectoris: Secondary | ICD-10-CM | POA: Diagnosis not present

## 2017-02-23 DIAGNOSIS — I4891 Unspecified atrial fibrillation: Secondary | ICD-10-CM | POA: Diagnosis not present

## 2017-02-23 LAB — POCT INR: INR: 5

## 2017-02-25 ENCOUNTER — Ambulatory Visit: Payer: Medicare Other | Admitting: Internal Medicine

## 2017-02-26 ENCOUNTER — Emergency Department (HOSPITAL_COMMUNITY): Payer: Medicare Other

## 2017-02-26 ENCOUNTER — Inpatient Hospital Stay (HOSPITAL_COMMUNITY)
Admission: EM | Admit: 2017-02-26 | Discharge: 2017-03-02 | DRG: 682 | Disposition: A | Discharge status: Home / Self Care | Payer: Medicare Other | Attending: Internal Medicine | Admitting: Internal Medicine

## 2017-02-26 ENCOUNTER — Encounter (HOSPITAL_COMMUNITY): Payer: Self-pay | Admitting: Emergency Medicine

## 2017-02-26 DIAGNOSIS — I252 Old myocardial infarction: Secondary | ICD-10-CM | POA: Diagnosis not present

## 2017-02-26 DIAGNOSIS — R404 Transient alteration of awareness: Secondary | ICD-10-CM | POA: Diagnosis not present

## 2017-02-26 DIAGNOSIS — N059 Unspecified nephritic syndrome with unspecified morphologic changes: Secondary | ICD-10-CM | POA: Diagnosis present

## 2017-02-26 DIAGNOSIS — K859 Acute pancreatitis without necrosis or infection, unspecified: Secondary | ICD-10-CM | POA: Diagnosis not present

## 2017-02-26 DIAGNOSIS — I5032 Chronic diastolic (congestive) heart failure: Secondary | ICD-10-CM | POA: Diagnosis not present

## 2017-02-26 DIAGNOSIS — Z803 Family history of malignant neoplasm of breast: Secondary | ICD-10-CM

## 2017-02-26 DIAGNOSIS — N184 Chronic kidney disease, stage 4 (severe): Secondary | ICD-10-CM | POA: Diagnosis present

## 2017-02-26 DIAGNOSIS — D649 Anemia, unspecified: Secondary | ICD-10-CM | POA: Diagnosis not present

## 2017-02-26 DIAGNOSIS — Z9049 Acquired absence of other specified parts of digestive tract: Secondary | ICD-10-CM

## 2017-02-26 DIAGNOSIS — Z800 Family history of malignant neoplasm of digestive organs: Secondary | ICD-10-CM

## 2017-02-26 DIAGNOSIS — R32 Unspecified urinary incontinence: Secondary | ICD-10-CM | POA: Diagnosis present

## 2017-02-26 DIAGNOSIS — E039 Hypothyroidism, unspecified: Secondary | ICD-10-CM | POA: Diagnosis not present

## 2017-02-26 DIAGNOSIS — N179 Acute kidney failure, unspecified: Secondary | ICD-10-CM | POA: Diagnosis present

## 2017-02-26 DIAGNOSIS — I130 Hypertensive heart and chronic kidney disease with heart failure and stage 1 through stage 4 chronic kidney disease, or unspecified chronic kidney disease: Secondary | ICD-10-CM | POA: Diagnosis present

## 2017-02-26 DIAGNOSIS — I4891 Unspecified atrial fibrillation: Secondary | ICD-10-CM | POA: Diagnosis not present

## 2017-02-26 DIAGNOSIS — Z6829 Body mass index (BMI) 29.0-29.9, adult: Secondary | ICD-10-CM

## 2017-02-26 DIAGNOSIS — Z7901 Long term (current) use of anticoagulants: Secondary | ICD-10-CM | POA: Diagnosis not present

## 2017-02-26 DIAGNOSIS — I495 Sick sinus syndrome: Secondary | ICD-10-CM | POA: Diagnosis present

## 2017-02-26 DIAGNOSIS — I776 Arteritis, unspecified: Secondary | ICD-10-CM | POA: Diagnosis present

## 2017-02-26 DIAGNOSIS — E118 Type 2 diabetes mellitus with unspecified complications: Secondary | ICD-10-CM | POA: Diagnosis present

## 2017-02-26 DIAGNOSIS — Z955 Presence of coronary angioplasty implant and graft: Secondary | ICD-10-CM

## 2017-02-26 DIAGNOSIS — G4733 Obstructive sleep apnea (adult) (pediatric): Secondary | ICD-10-CM | POA: Diagnosis present

## 2017-02-26 DIAGNOSIS — I480 Paroxysmal atrial fibrillation: Secondary | ICD-10-CM | POA: Diagnosis not present

## 2017-02-26 DIAGNOSIS — E872 Acidosis, unspecified: Secondary | ICD-10-CM | POA: Diagnosis present

## 2017-02-26 DIAGNOSIS — Z7982 Long term (current) use of aspirin: Secondary | ICD-10-CM

## 2017-02-26 DIAGNOSIS — E538 Deficiency of other specified B group vitamins: Secondary | ICD-10-CM | POA: Diagnosis not present

## 2017-02-26 DIAGNOSIS — N189 Chronic kidney disease, unspecified: Secondary | ICD-10-CM | POA: Diagnosis present

## 2017-02-26 DIAGNOSIS — R55 Syncope and collapse: Secondary | ICD-10-CM | POA: Diagnosis not present

## 2017-02-26 DIAGNOSIS — K219 Gastro-esophageal reflux disease without esophagitis: Secondary | ICD-10-CM | POA: Diagnosis not present

## 2017-02-26 DIAGNOSIS — R112 Nausea with vomiting, unspecified: Secondary | ICD-10-CM | POA: Diagnosis not present

## 2017-02-26 DIAGNOSIS — D638 Anemia in other chronic diseases classified elsewhere: Secondary | ICD-10-CM | POA: Diagnosis present

## 2017-02-26 DIAGNOSIS — Z8249 Family history of ischemic heart disease and other diseases of the circulatory system: Secondary | ICD-10-CM

## 2017-02-26 DIAGNOSIS — I2510 Atherosclerotic heart disease of native coronary artery without angina pectoris: Secondary | ICD-10-CM | POA: Diagnosis present

## 2017-02-26 DIAGNOSIS — E785 Hyperlipidemia, unspecified: Secondary | ICD-10-CM | POA: Diagnosis not present

## 2017-02-26 DIAGNOSIS — E860 Dehydration: Secondary | ICD-10-CM | POA: Diagnosis not present

## 2017-02-26 DIAGNOSIS — E869 Volume depletion, unspecified: Secondary | ICD-10-CM | POA: Diagnosis not present

## 2017-02-26 DIAGNOSIS — E1122 Type 2 diabetes mellitus with diabetic chronic kidney disease: Secondary | ICD-10-CM | POA: Diagnosis present

## 2017-02-26 DIAGNOSIS — F329 Major depressive disorder, single episode, unspecified: Secondary | ICD-10-CM | POA: Diagnosis present

## 2017-02-26 DIAGNOSIS — I10 Essential (primary) hypertension: Secondary | ICD-10-CM | POA: Diagnosis not present

## 2017-02-26 LAB — HEPATIC FUNCTION PANEL
ALK PHOS: 60 U/L (ref 38–126)
ALT: 37 U/L (ref 14–54)
AST: 30 U/L (ref 15–41)
Albumin: 3.2 g/dL — ABNORMAL LOW (ref 3.5–5.0)
BILIRUBIN DIRECT: 0.4 mg/dL (ref 0.1–0.5)
BILIRUBIN INDIRECT: 1.4 mg/dL — AB (ref 0.3–0.9)
BILIRUBIN TOTAL: 1.8 mg/dL — AB (ref 0.3–1.2)
Total Protein: 6 g/dL — ABNORMAL LOW (ref 6.5–8.1)

## 2017-02-26 LAB — URINALYSIS, ROUTINE W REFLEX MICROSCOPIC
Bilirubin Urine: NEGATIVE
GLUCOSE, UA: 50 mg/dL — AB
KETONES UR: NEGATIVE mg/dL
NITRITE: NEGATIVE
PH: 5 (ref 5.0–8.0)
Protein, ur: 100 mg/dL — AB
Specific Gravity, Urine: 1.01 (ref 1.005–1.030)
Squamous Epithelial / LPF: NONE SEEN

## 2017-02-26 LAB — PROTIME-INR
INR: 2.6
Prothrombin Time: 28.4 seconds — ABNORMAL HIGH (ref 11.4–15.2)

## 2017-02-26 LAB — CREATININE, URINE, RANDOM: CREATININE, URINE: 61.83 mg/dL

## 2017-02-26 LAB — CBC
HCT: 27 % — ABNORMAL LOW (ref 36.0–46.0)
HEMOGLOBIN: 8.7 g/dL — AB (ref 12.0–15.0)
MCH: 29 pg (ref 26.0–34.0)
MCHC: 32.2 g/dL (ref 30.0–36.0)
MCV: 90 fL (ref 78.0–100.0)
PLATELETS: 269 10*3/uL (ref 150–400)
RBC: 3 MIL/uL — AB (ref 3.87–5.11)
RDW: 15.9 % — ABNORMAL HIGH (ref 11.5–15.5)
WBC: 11.2 10*3/uL — ABNORMAL HIGH (ref 4.0–10.5)

## 2017-02-26 LAB — I-STAT CG4 LACTIC ACID, ED: LACTIC ACID, VENOUS: 2.83 mmol/L — AB (ref 0.5–1.9)

## 2017-02-26 LAB — BASIC METABOLIC PANEL
ANION GAP: 13 (ref 5–15)
BUN: 97 mg/dL — ABNORMAL HIGH (ref 6–20)
CALCIUM: 8.8 mg/dL — AB (ref 8.9–10.3)
CO2: 19 mmol/L — ABNORMAL LOW (ref 22–32)
CREATININE: 4.61 mg/dL — AB (ref 0.44–1.00)
Chloride: 104 mmol/L (ref 101–111)
GFR, EST AFRICAN AMERICAN: 10 mL/min — AB (ref >60)
GFR, EST NON AFRICAN AMERICAN: 9 mL/min — AB (ref >60)
Glucose, Bld: 176 mg/dL — ABNORMAL HIGH (ref 65–99)
Potassium: 5.1 mmol/L (ref 3.5–5.1)
SODIUM: 136 mmol/L (ref 135–145)

## 2017-02-26 LAB — OSMOLALITY, URINE: OSMOLALITY UR: 365 mosm/kg (ref 300–900)

## 2017-02-26 LAB — TROPONIN I
TROPONIN I: 0.03 ng/mL — AB (ref <0.03)
Troponin I: 0.03 ng/mL (ref <0.03)

## 2017-02-26 LAB — SODIUM, URINE, RANDOM: SODIUM UR: 61 mmol/L

## 2017-02-26 LAB — CBG MONITORING, ED: Glucose-Capillary: 185 mg/dL — ABNORMAL HIGH (ref 65–99)

## 2017-02-26 LAB — LIPASE, BLOOD: Lipase: 136 U/L — ABNORMAL HIGH (ref 11–51)

## 2017-02-26 MED ORDER — AMLODIPINE BESYLATE 5 MG PO TABS
5.0000 mg | ORAL_TABLET | Freq: Every day | ORAL | Status: DC
  Administered 2017-02-27 – 2017-03-02 (×4): 5 mg via ORAL
  Filled 2017-02-26 (×4): qty 1

## 2017-02-26 MED ORDER — ISOSORBIDE MONONITRATE ER 60 MG PO TB24
60.0000 mg | ORAL_TABLET | Freq: Every day | ORAL | Status: DC
  Administered 2017-02-27 – 2017-03-01 (×4): 60 mg via ORAL
  Filled 2017-02-26 (×4): qty 1

## 2017-02-26 MED ORDER — ASPIRIN EC 81 MG PO TBEC
81.0000 mg | DELAYED_RELEASE_TABLET | Freq: Every day | ORAL | Status: DC
  Administered 2017-02-27 – 2017-03-02 (×4): 81 mg via ORAL
  Filled 2017-02-26 (×4): qty 1

## 2017-02-26 MED ORDER — PROMETHAZINE HCL 25 MG/ML IJ SOLN: 12.5000 mg | Freq: Four times a day (QID) | INTRAMUSCULAR | Status: DC | PRN

## 2017-02-26 MED ORDER — SODIUM CHLORIDE 0.9 % IV BOLUS (SEPSIS)
500.0000 mL | Freq: Once | INTRAVENOUS | Status: AC
  Administered 2017-02-26: 500 mL via INTRAVENOUS

## 2017-02-26 MED ORDER — FAMOTIDINE 20 MG PO TABS
20.0000 mg | ORAL_TABLET | Freq: Two times a day (BID) | ORAL | Status: DC
  Filled 2017-02-26: qty 1

## 2017-02-26 MED ORDER — RISAQUAD PO CAPS
1.0000 | ORAL_CAPSULE | Freq: Two times a day (BID) | ORAL | Status: DC
  Filled 2017-02-26: qty 1

## 2017-02-26 MED ORDER — WARFARIN - PHARMACIST DOSING INPATIENT
Freq: Every day | Status: DC
  Administered 2017-02-27 – 2017-02-28 (×2): 1

## 2017-02-26 MED ORDER — INSULIN ASPART 100 UNIT/ML ~~LOC~~ SOLN: 0.0000 [IU] | Freq: Every day | SUBCUTANEOUS | Status: DC

## 2017-02-26 MED ORDER — ACETAMINOPHEN 650 MG RE SUPP: 650.0000 mg | Freq: Four times a day (QID) | RECTAL | Status: DC | PRN

## 2017-02-26 MED ORDER — ONDANSETRON HCL 4 MG/2ML IJ SOLN
4.0000 mg | Freq: Once | INTRAMUSCULAR | Status: AC
  Administered 2017-02-26: 4 mg via INTRAVENOUS
  Filled 2017-02-26: qty 2

## 2017-02-26 MED ORDER — WARFARIN SODIUM 2.5 MG PO TABS
2.5000 mg | ORAL_TABLET | Freq: Once | ORAL | Status: AC
  Administered 2017-02-27: 2.5 mg via ORAL
  Filled 2017-02-26: qty 1

## 2017-02-26 MED ORDER — SODIUM CHLORIDE 0.9% FLUSH
3.0000 mL | Freq: Two times a day (BID) | INTRAVENOUS | Status: DC
  Administered 2017-02-27 – 2017-03-02 (×6): 3 mL via INTRAVENOUS

## 2017-02-26 MED ORDER — METOPROLOL TARTRATE 25 MG PO TABS
25.0000 mg | ORAL_TABLET | Freq: Three times a day (TID) | ORAL | Status: DC
  Administered 2017-02-26 – 2017-03-02 (×11): 25 mg via ORAL
  Filled 2017-02-26 (×12): qty 1

## 2017-02-26 MED ORDER — ONDANSETRON HCL 4 MG PO TABS: 4.0000 mg | ORAL_TABLET | Freq: Four times a day (QID) | ORAL | Status: DC | PRN

## 2017-02-26 MED ORDER — INSULIN ASPART 100 UNIT/ML ~~LOC~~ SOLN: 0.0000 [IU] | Freq: Three times a day (TID) | SUBCUTANEOUS | Status: DC

## 2017-02-26 MED ORDER — ONDANSETRON HCL 4 MG/2ML IJ SOLN
4.0000 mg | Freq: Four times a day (QID) | INTRAMUSCULAR | Status: DC | PRN
  Administered 2017-02-27 – 2017-03-01 (×2): 4 mg via INTRAVENOUS
  Filled 2017-02-26 (×2): qty 2

## 2017-02-26 MED ORDER — SODIUM CHLORIDE 0.9 % IV BOLUS (SEPSIS)
1000.0000 mL | Freq: Once | INTRAVENOUS | Status: AC
  Administered 2017-02-26: 1000 mL via INTRAVENOUS

## 2017-02-26 MED ORDER — SUCRALFATE 1 GM/10ML PO SUSP: 1.0000 g | Freq: Two times a day (BID) | ORAL | Status: DC | PRN

## 2017-02-26 MED ORDER — SODIUM CHLORIDE 0.9 % IV SOLN
INTRAVENOUS | Status: DC
  Administered 2017-02-26: 75 mL/h via INTRAVENOUS
  Administered 2017-02-27 – 2017-02-28 (×3): via INTRAVENOUS

## 2017-02-26 MED ORDER — ACETAMINOPHEN 325 MG PO TABS
650.0000 mg | ORAL_TABLET | Freq: Four times a day (QID) | ORAL | Status: DC | PRN
  Filled 2017-02-26: qty 2

## 2017-02-26 MED ORDER — DICYCLOMINE HCL 10 MG PO CAPS: 10.0000 mg | ORAL_CAPSULE | Freq: Three times a day (TID) | ORAL | Status: DC | PRN

## 2017-02-26 MED ORDER — METHYLPREDNISOLONE SODIUM SUCC 40 MG IJ SOLR
40.0000 mg | Freq: Every day | INTRAMUSCULAR | Status: DC
  Administered 2017-02-27: 40 mg via INTRAVENOUS
  Filled 2017-02-26: qty 1

## 2017-02-26 MED ORDER — METHYLPREDNISOLONE SODIUM SUCC 40 MG IJ SOLR
10.0000 mg | Freq: Every day | INTRAMUSCULAR | Status: DC
  Administered 2017-02-26: 10 mg via INTRAVENOUS
  Filled 2017-02-26: qty 1

## 2017-02-26 NOTE — ED Notes (Signed)
CBG 160 

## 2017-02-26 NOTE — ED Notes (Signed)
Two unsuccessful attempts at IV access. EMS was unable to get IV access as well.

## 2017-02-26 NOTE — ED Notes (Signed)
Pt asked about providing a urine sample. Pt stated that she was unable at the moment. Pt will try later.

## 2017-02-26 NOTE — ED Triage Notes (Signed)
Per EMS, patient is in chronic kidney failure but NOT on dialysis.  Per family, patient has been lethargic x 3 days. Was at [place] office this morning, and patient had a syncopal episode for 15-20 seconds.  Upon EMS arrival, patient A/O x 4, pale, and afebrile.  Hx of afib and pulmonary fibrosis.  No head injury.  EKG NSR with PVCs.  Patient on eloquis of Afib. 145/83, 66 NSR, 97% 2L, 16 RR, CBG 165.  No IV.

## 2017-02-26 NOTE — ED Notes (Signed)
Unsuccessful attempt at giving report to nurse mike on 3E.

## 2017-02-26 NOTE — ED Notes (Signed)
Pt states that she does not urinate (CKD).

## 2017-02-26 NOTE — H&P (Signed)
History and Physical    Jody Ruiz KNL:[id number] DOB: [birth year] DOA: 02/26/2017  PCP: Burnard Bunting, MD Patient coming from: Nephrology office  Chief Complaint: syncope  HPI: Jody Ruiz is a 76 y.o. female with medical history significant of obesity, MI, lung nodule, hypertension, I DP, hyperlipidemia, hypothyroidism, atrial fibrillation. Patient reports a general decline in her overall health since January with more marked decline since July as patient's renal function has declined. Patient endorses persistent nausea since starting Cytoxan for treatment of ANCA glomerulonephritis. Patient reports very little orally intubated over the last 1-2 weeks. Only over the last 1-2 days. Patient was seen and her nephrologist's office on day of admission for routine evaluation when she had a syncopal episode in her chair. Patient never lost a pulse and never stopped breathing. She was assisted from her chair to the floor. Regained consciousness after approximately 1 minute. No reports of loss of bowel or bladder function, seizure type activity, tongue biting, or post syncopal confusion or somnolence.  No recently reported dysuria, frequency, flank pain, focal neurological deficits, headache, neck stiffness, cough, chest pain, shortness of breath.  ED Course: 1.5 L normal saline. Zofran given in ED.  Review of Systems: As per HPI otherwise all other systems reviewed and are negative  Ambulatory Status: No instructions.  Past Medical History:  Diagnosis Date  . Arthritis   . Atrial fibrillation ([place])   . Borderline hypothyroidism   . CAD (coronary artery disease)    a. 04/2012 NSTEMI/Cath:  pLAD 90%, mLAD 70-80% (long) (3x16 & 2.5x28 Promus DES to p/m LAD), pD1 70-80%, pRI 30% (small), CFX 20%, OM1 40%, mOM2 30%, pRCA 30%, PDA 30-40%, pPLB 70%, then mid 90%, EF 65%;  b. 03/2013 Abnl CL w/apical isch;  c. 04/2013 Cath: LM nl, LAD patent stents, LCX <20, RCA 30p PDA 40-50 EF 55-60%  ->Cont Med Rx.  c. CP s/p LHC with stable dz and patent stents--> Rx medically   . Cataract   . Dyspnea    a. CP and SOB 06/2012 => Ticagrelor changed to Plavix  . History of Clostridium difficile infection   . HLD (hyperlipidemia)    intolerant to statins  . Hx of adenomatous colonic polyps   . Hx of echocardiogram    a. Echo 2/14:  mild LVH, EF 55-65%, Gr 1 diast dysfn, mild LAE  . Hypertension   . Idiopathic pulmonary fibrosis   . Internal hemorrhoids   . Lung nodules   . Microscopic colitis   . Myocardial infarction ([place]) 2013  . Obesity   . OSA (obstructive sleep apnea)    a. on CPAP  . Sleep apnea    cpap    Past Surgical History:  Procedure Laterality Date  . APPENDECTOMY  1994  . BREAST BIOPSY  1965, 1975   bilateral  . CARDIAC CATHETERIZATION     x4  . CHOLECYSTECTOMY  2004  . CORONARY STENT PLACEMENT  04/2012   x2  . KNEE ARTHROSCOPY  2000   bilateral  . LEFT AND RIGHT HEART CATHETERIZATION WITH CORONARY ANGIOGRAM N/A 08/02/2014   Procedure: LEFT AND RIGHT HEART CATHETERIZATION WITH CORONARY ANGIOGRAM;  Surgeon: Burnell Blanks, MD;  Location: [facility] CATH LAB;  Service: Cardiovascular;  Laterality: N/A;  . LEFT HEART CATHETERIZATION WITH CORONARY ANGIOGRAM N/A 04/29/2012   Procedure: LEFT HEART CATHETERIZATION WITH CORONARY ANGIOGRAM;  Surgeon: Peter M Martinique, MD;  Location: [facility] CATH LAB;  Service: Cardiovascular;  Laterality: N/A;  . LEFT HEART CATHETERIZATION WITH CORONARY  ANGIOGRAM N/A 08/03/2012   Procedure: LEFT HEART CATHETERIZATION WITH CORONARY ANGIOGRAM;  Surgeon: Burnell Blanks, MD;  Location: [facility] CATH LAB;  Service: Cardiovascular;  Laterality: N/A;  . LEFT HEART CATHETERIZATION WITH CORONARY ANGIOGRAM N/A 04/12/2013   Procedure: LEFT HEART CATHETERIZATION WITH CORONARY ANGIOGRAM;  Surgeon: Peter M Martinique, MD;  Location: [facility] CATH LAB;  Service: Cardiovascular;  Laterality: N/A;  . PERCUTANEOUS CORONARY STENT INTERVENTION (PCI-S) N/A 04/29/2012    Procedure: PERCUTANEOUS CORONARY STENT INTERVENTION (PCI-S);  Surgeon: Peter M Martinique, MD;  Location: [facility] CATH LAB;  Service: Cardiovascular;  Laterality: N/A;  . SHOULDER ARTHROSCOPY  2012   left  . vaginal polypectomy  2004  . VENTRAL HERNIA REPAIR  2005    Social History   Social History  . Marital status: Married    Spouse name: N/A  . Number of children: 3  . Years of education: N/A   Occupational History  . Retired Therapist, sports Retired   Social History Main Topics  . Smoking status: Never Smoker  . Smokeless tobacco: Never Used     Comment: second hand smoke   . Alcohol use No  . Drug use: No  . Sexual activity: No   Other Topics Concern  . Not on file   Social History Narrative  . No narrative on file    Allergies  Allergen Reactions  . Brilinta [Ticagrelor] Shortness Of Breath, Other (See Comments) and Cough    Reaction:  Fatigue  . Lisinopril Shortness Of Breath and Cough  . Statins Other (See Comments)    Reaction:  Muscle pain/weakness  . Zetia [Ezetimibe] Other (See Comments)    Reaction:  Muscle pain/weakness   . Macrodantin [Nitrofurantoin Macrocrystal] Rash  . Sulfa Antibiotics Rash    Family History  Problem Relation Age of Onset  . Breast cancer Sister   . Prostate cancer Father   . Colon polyps Father   . Heart disease Father 40       CAD, died of ESRD  . Kidney disease Father   . Colon cancer Sister 22  . Heart disease Mother 51       Died acute MI  . Heart disease Brother 19       CABG  . Esophageal cancer Neg Hx   . Rectal cancer Neg Hx   . Stomach cancer Neg Hx       Prior to Admission medications   Medication Sig Start Date End Date Taking? Authorizing Provider  amLODipine (NORVASC) 5 MG tablet Take 5 mg by mouth daily.   Yes [provider]  aspirin EC 81 MG tablet Take 81 mg by mouth daily.   Yes [provider]  cyclophosphamide (CYTOXAN) 25 MG capsule Take 3 capsules (75 mg total) by mouth daily. Give on an empty  stomach 1 hour before or 2 hours after meals. 01/22/17  Yes Regalado, Belkys A, MD  dapsone 100 MG tablet Take 100 mg by mouth at bedtime.    Yes [provider]  dicyclomine (BENTYL) 10 MG capsule Take 1 capsule (10 mg total) by mouth 3 (three) times daily as needed for spasms. 01/21/17  Yes Regalado, Belkys A, MD  famotidine (PEPCID) 20 MG tablet Take 1 tablet (20 mg total) by mouth 2 (two) times daily. 01/21/17  Yes Regalado, Belkys A, MD  isosorbide mononitrate (IMDUR) 60 MG 24 hr tablet Take 60 mg by mouth at bedtime.   Yes [provider]  metoprolol tartrate (LOPRESSOR) 25 MG tablet Take 1  tablet (25 mg total) by mouth 3 (three) times daily. 01/22/17  Yes Regalado, Belkys A, MD  nitroGLYCERIN (NITROSTAT) 0.4 MG SL tablet Place 1 tablet (0.4 mg total) under the tongue every 5 (five) minutes x 3 doses as needed for chest pain. 08/03/14  Yes Eileen Stanford, PA-C  ondansetron (ZOFRAN) 4 MG tablet Take 1 tablet (4 mg total) by mouth every 8 (eight) hours as needed for nausea or vomiting. 01/08/17  Yes Irene Shipper, MD  predniSONE (DELTASONE) 10 MG tablet Take by mouth See admin instructions. Take as directed by doctor with a taper dose 02/13/17  Yes [provider]  sucralfate (CARAFATE) 1 GM/10ML suspension Take 1 g by mouth 2 (two) times daily as needed.   Yes [provider]  saccharomyces boulardii (FLORASTOR) 250 MG capsule Take 1 capsule (250 mg total) by mouth 2 (two) times daily. 01/21/17   Regalado, Belkys A, MD  warfarin (COUMADIN) 2.5 MG tablet Take 1 tablet (2.5 mg total) by mouth daily. Patient taking differently: Take 1.25-2.5 mg by mouth See admin instructions. Take 1.25mg  (1/2 tablet) by mouth on Monday, Wednesday, and Friday.  Take 1 tablet (2.5mg ) by mouth on Tuesday, Thursday, Saturday, and sunday 01/28/17   Martinique, Peter M, MD    Physical Exam: Vitals:   02/26/17 1545 02/26/17 1600 02/26/17 1615 02/26/17 1700  BP: (!) 107/58 (!) 110/52 (!)  102/49 136/65  Pulse: 71 69 66 82  Resp: 13 13 10 13   Temp:      TempSrc:      SpO2: 100% 96% 95% 91%  Weight:      Height:         General: Elderly and ill-appearing, resting in bed. Eyes:  PERRL, EOMI, normal lids, iris ENT: very dry mm, nml hearing  Neck:  no LAD, masses or thyromegaly Cardiovascular:  RRR, no m/r/g. No LE edema.  Respiratory:  CTA bilaterally, no w/r/r. Normal respiratory effort. Abdomen:  soft, ntnd, NABS Skin: poor skin turgor.  no rash or induration seen on limited exam Musculoskeletal:  grossly normal tone BUE/BLE, good ROM, no bony abnormality Psychiatric: grossly normal mood and affect, speech fluent and appropriate, AOx3 Neurologic:  CN 2-12 grossly intact, moves all extremities in coordinated fashion, sensation intact  Labs on Admission: I have personally reviewed following labs and imaging studies  CBC:  Recent Labs Lab 02/26/17 1220  WBC 11.2*  HGB 8.7*  HCT 27.0*  MCV 90.0  PLT 093   Basic Metabolic Panel:  Recent Labs Lab 02/26/17 1220  NA 136  K 5.1  CL 104  CO2 19*  GLUCOSE 176*  BUN 97*  CREATININE 4.61*  CALCIUM 8.8*   GFR: Estimated Creatinine Clearance: 12.1 mL/min (A) (by C-G formula based on SCr of 4.61 mg/dL (H)). Liver Function Tests:  Recent Labs Lab 02/26/17 1201  AST 30  ALT 37  ALKPHOS 60  BILITOT 1.8*  PROT 6.0*  ALBUMIN 3.2*    Recent Labs Lab 02/26/17 1201  LIPASE 136*   No results for input(s): AMMONIA in the last 168 hours. Coagulation Profile:  Recent Labs Lab 02/23/17 1139 02/26/17 1311  INR 5.0 2.60   Cardiac Enzymes:  Recent Labs Lab 02/26/17 1201  TROPONINI 0.03*   BNP (last 3 results) No results for input(s): PROBNP in the last 8760 hours. HbA1C: No results for input(s): HGBA1C in the last 72 hours. CBG: No results for input(s): GLUCAP in the last 168 hours. Lipid Profile: No results for input(s): CHOL,  HDL, LDLCALC, TRIG, CHOLHDL, LDLDIRECT in the last 72  hours. Thyroid Function Tests: No results for input(s): TSH, T4TOTAL, FREET4, T3FREE, THYROIDAB in the last 72 hours. Anemia Panel: No results for input(s): VITAMINB12, FOLATE, FERRITIN, TIBC, IRON, RETICCTPCT in the last 72 hours. Urine analysis:    Component Value Date/Time   COLORURINE YELLOW 02/26/2017 1525   APPEARANCEUR CLEAR 02/26/2017 1525   LABSPEC 1.010 02/26/2017 1525   PHURINE 5.0 02/26/2017 1525   GLUCOSEU 50 (A) 02/26/2017 1525   HGBUR LARGE (A) 02/26/2017 1525   BILIRUBINUR NEGATIVE 02/26/2017 1525   KETONESUR NEGATIVE 02/26/2017 1525   PROTEINUR 100 (A) 02/26/2017 1525   NITRITE NEGATIVE 02/26/2017 1525   LEUKOCYTESUR TRACE (A) 02/26/2017 1525    Creatinine Clearance: Estimated Creatinine Clearance: 12.1 mL/min (A) (by C-G formula based on SCr of 4.61 mg/dL (H)).  Sepsis Labs: @LABRCNTIP (procalcitonin:4,lacticidven:4) )No results found for this or any previous visit (from the past 240 hour(s)).   Radiological Exams on Admission: Dg Chest 2 View  Result Date: 02/26/2017 CLINICAL DATA:  Chronic renal failure. The patient has been lethargic. EXAM: CHEST  2 VIEW COMPARISON:  January 16, 2017 FINDINGS: Elevation of the right hemidiaphragm persists. The heart, hila, mediastinum, lungs, and pleura are otherwise normal. No acute interval changes. IMPRESSION: No active cardiopulmonary disease. Electronically Signed   By: Dorise Bullion III M.D   On: 02/26/2017 12:39    EKG: Independently reviewed. Sinus, a complete left frontal branch block.  Assessment/Plan Active Problems:   Tachycardia-bradycardia syndrome (HCC)   Acute kidney injury superimposed on chronic kidney disease (HCC)   Glomerulonephritis   Syncope   PAF (paroxysmal atrial fibrillation) (HCC)   Lactic acidemia   Chronic diastolic CHF (congestive heart failure) (HCC)   Diabetes mellitus with complication (HCC)   Syncope: Possibly multifactorial including significant physical deconditioning or several  weeks in the setting of dehydration and possible arrhythmia. Fortunately only a single episode with no evidence of head trauma, infectious etiology, medication overdose/overuse, seizure type activity. - Telemetry - Neuro checks - EKG in a.m. - Cycle troponin - IVF  ANCA Glomerulonephritis: Creatinine 4.61. Recent rapid decline. New diagnoses after renal biopsy in July. Patient has not tolerated her medications well as outlined above. - Hold Cytoxan  -  IV solumedrol - Further management per nephrology  PAF: sinus and rate controlled at time of admission - continue coumadin  Lactic acidemia: Likely secondary to severe dehydration and advanced renal failure - Trend lactic acid -IVF  Diabetes: - SSI  HTN: - continue norvasc, Imdur, lopressor  GERD/IBS: - copntinue carafate, PPI,bentyl  Chronic diastolic congestive heart failure: Last echo showing an EF 55% and grade 1 diastolic disruption. No evidence of acute decompensation. - I's and O's, daily weights  Abnormal urinalysis: No reports of dysuria frequency or flank pain. Large amount of blood in urine with 6-30 WBC and rare bacteria. Negative for ketones and negative for nitrates. Suspect this is due to ongoing kidney injury - Urine culture  Pancreatitis: Mild and asymptomatic. Lipase down trending from 141-136. Likely secondary to occasions. - No further intervention   DVT prophylaxis: coumadin  Code Status: full  Family Communication: daughters  Disposition Plan: pending improvement in renal function and evaluation by renal  Consults called: Nephrology - reconsult in am per Dr. Hollie Salk  Admission status: inpt    Caelie Remsburg J MD [facility]  If 7PM-7AM, please contact night-coverage [email or web address] Password [facility]  02/26/2017, 5:39 PM

## 2017-02-26 NOTE — Progress Notes (Addendum)
ANTICOAGULATION CONSULT NOTE - Initial Consult  Pharmacy Consult for warfarin Indication: atrial fibrillation  Allergies  Allergen Reactions  . Brilinta [Ticagrelor] Shortness Of Breath, Other (See Comments) and Cough    Reaction:  Fatigue  . Lisinopril Shortness Of Breath and Cough  . Statins Other (See Comments)    Reaction:  Muscle pain/weakness  . Zetia [Ezetimibe] Other (See Comments)    Reaction:  Muscle pain/weakness   . Macrodantin [Nitrofurantoin Macrocrystal] Rash  . Sulfa Antibiotics Rash    Patient Measurements: Height: 5\' 7"  (170.2 cm) Weight: 190 lb (86.2 kg) IBW/kg (Calculated) : 61.6  Vital Signs: Temp: 97.5 F (36.4 C) (08/23 1124) Temp Source: Oral (08/23 1124) BP: 130/68 (08/23 1730) Pulse Rate: 69 (08/23 1730)  Labs:  Recent Labs  02/26/17 1201 02/26/17 1220 02/26/17 1311  HGB  --  8.7*  --   HCT  --  27.0*  --   PLT  --  269  --   LABPROT  --   --  28.4*  INR  --   --  2.60  CREATININE  --  4.61*  --   TROPONINI 0.03*  --   --     Estimated Creatinine Clearance: 12.1 mL/min (A) (by C-G formula based on SCr of 4.61 mg/dL (H)).   Medical History: Past Medical History:  Diagnosis Date  . Arthritis   . Atrial fibrillation ([place])   . Borderline hypothyroidism   . CAD (coronary artery disease)    a. 04/2012 NSTEMI/Cath:  pLAD 90%, mLAD 70-80% (long) (3x16 & 2.5x28 Promus DES to p/m LAD), pD1 70-80%, pRI 30% (small), CFX 20%, OM1 40%, mOM2 30%, pRCA 30%, PDA 30-40%, pPLB 70%, then mid 90%, EF 65%;  b. 03/2013 Abnl CL w/apical isch;  c. 04/2013 Cath: LM nl, LAD patent stents, LCX <20, RCA 30p PDA 40-50 EF 55-60% ->Cont Med Rx.  c. CP s/p LHC with stable dz and patent stents--> Rx medically   . Cataract   . Dyspnea    a. CP and SOB 06/2012 => Ticagrelor changed to Plavix  . History of Clostridium difficile infection   . HLD (hyperlipidemia)    intolerant to statins  . Hx of adenomatous colonic polyps   . Hx of echocardiogram    a. Echo 2/14:   mild LVH, EF 55-65%, Gr 1 diast dysfn, mild LAE  . Hypertension   . Idiopathic pulmonary fibrosis   . Internal hemorrhoids   . Lung nodules   . Microscopic colitis   . Myocardial infarction ([place]) 2013  . Obesity   . OSA (obstructive sleep apnea)    a. on CPAP  . Sleep apnea    cpap    Assessment: 76 yo F PMH obesity, MI, lung nodule, HLD, hypothyroidism and atrial fibrillation. Pharmacy consulted to dose warfarin for atrial fibrillation with RVR. No signs or symptoms of bleeding noted. INR therapeutic, 2.6 on home dose of warfarin.  PTA dose: warfarin 1.25mg  PO MWF, 2.5mg  all other days.   Goal of Therapy:  INR 2-3 Monitor platelets by anticoagulation protocol: Yes   Plan:  Warfarin 2.5 mg PO x 1 Daily INR, CBCs F/u s/sx bleeding, PO intake, DDI  Nida Boatman, PharmD PGY1 Acute Care Pharmacy Resident Pager: [phone] 02/26/2017,5:54 PM

## 2017-02-26 NOTE — ED Notes (Signed)
On way to XR 

## 2017-02-26 NOTE — ED Notes (Signed)
EDP made aware of elevated lactic acid of 2.83.

## 2017-02-26 NOTE — ED Notes (Signed)
Patient is unable to urinate without the cytoxin.  Patient has not taken cytoxin today.

## 2017-02-26 NOTE — ED Provider Notes (Signed)
[facility] DEPT Provider Note   CSN: [id number] Arrival date & time: 02/26/17  1115     History   Chief Complaint Chief Complaint  Patient presents with  . Loss of Consciousness    HPI Jody Ruiz is a 76 y.o. female.  HPI Patient presents after syncopal episode. Was at her 46 office and was going to be transferred to the hospital for IV fluids and rehydration.has had decreased oral intake. Has a history chronic kidney disease and is on steroids andCytoxan.while in the office she had a syncopal episode. Was still breathing and had a pulse but was unresponsive. Woke up. Has not had much oral intake. Has had nausea. Decreased urine 2. Although she basically has very little urine output to start with. Has been unable to keep up with her medication. No chest pain. No fevers. No chills. Past Medical History:  Diagnosis Date  . Arthritis   . Atrial fibrillation ([place])   . Borderline hypothyroidism   . CAD (coronary artery disease)    a. 04/2012 NSTEMI/Cath:  pLAD 90%, mLAD 70-80% (long) (3x16 & 2.5x28 Promus DES to p/m LAD), pD1 70-80%, pRI 30% (small), CFX 20%, OM1 40%, mOM2 30%, pRCA 30%, PDA 30-40%, pPLB 70%, then mid 90%, EF 65%;  b. 03/2013 Abnl CL w/apical isch;  c. 04/2013 Cath: LM nl, LAD patent stents, LCX <20, RCA 30p PDA 40-50 EF 55-60% ->Cont Med Rx.  c. CP s/p LHC with stable dz and patent stents--> Rx medically   . Cataract   . Dyspnea    a. CP and SOB 06/2012 => Ticagrelor changed to Plavix  . History of Clostridium difficile infection   . HLD (hyperlipidemia)    intolerant to statins  . Hx of adenomatous colonic polyps   . Hx of echocardiogram    a. Echo 2/14:  mild LVH, EF 55-65%, Gr 1 diast dysfn, mild LAE  . Hypertension   . Idiopathic pulmonary fibrosis   . Internal hemorrhoids   . Lung nodules   . Microscopic colitis   . Myocardial infarction ([place]) 2013  . Obesity   . OSA (obstructive sleep apnea)    a. on CPAP  . Sleep apnea    cpap     Patient Active Problem List   Diagnosis Date Noted  . Long term (current) use of anticoagulants [Z79.01] 02/02/2017  . SOB (shortness of breath)   . Atrial fibrillation with RVR ([place])   . Glomerulonephritis   . Loose stools   . History of Clostridium difficile colitis   . AKI (acute kidney injury) ([place]) 01/14/2017  . Dyspepsia   . Acute kidney injury superimposed on chronic kidney disease ([place]) 01/13/2017  . H/O umbilical hernia repair 92/05/9416  . Belching 01/02/2017  . Gastroesophageal reflux disease 01/02/2017  . Right sided abdominal pain 01/02/2017  . Palpitations   . Tachycardia-bradycardia syndrome ([place])   . Chest pain 11/05/2016  . Renal insufficiency 10/16/2016  . B12 deficiency 10/16/2016  . Enteritis due to Clostridium difficile   . Diarrhea with dehydration 10/14/2016  . Microscopic colitis 10/02/2016  . Hx of adenomatous colonic polyps 10/02/2016  . Altered mental status   . CAD (coronary artery disease)   . Interstitial lung disease ([place])   . HLD (hyperlipidemia)   . Precordial pain 07/17/2013  . Midsternal chest pain 07/17/2013  . Pulmonary nodule 11/22/2012  . 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 OSA (obstructive sleep apnea) 10/05/2012  . IPF (idiopathic pulmonary fibrosis) ([place]) 08/26/2012  .  Coronary atherosclerosis of native coronary artery 05/04/2012  . Hypertension   . Obesity     Past Surgical History:  Procedure Laterality Date  . APPENDECTOMY  1994  . BREAST BIOPSY  1965, 1975   bilateral  . CARDIAC CATHETERIZATION     x4  . CHOLECYSTECTOMY  2004  . CORONARY STENT PLACEMENT  04/2012   x2  . KNEE ARTHROSCOPY  2000   bilateral  . LEFT AND RIGHT HEART CATHETERIZATION WITH CORONARY ANGIOGRAM N/A 08/02/2014   Procedure: LEFT AND RIGHT HEART CATHETERIZATION WITH CORONARY ANGIOGRAM;  Surgeon: Burnell Blanks, MD;  Location: [facility] CATH LAB;  Service: Cardiovascular;  Laterality: N/A;  . LEFT HEART CATHETERIZATION WITH CORONARY  ANGIOGRAM N/A 04/29/2012   Procedure: LEFT HEART CATHETERIZATION WITH CORONARY ANGIOGRAM;  Surgeon: Peter M Martinique, MD;  Location: [facility] CATH LAB;  Service: Cardiovascular;  Laterality: N/A;  . LEFT HEART CATHETERIZATION WITH CORONARY ANGIOGRAM N/A 08/03/2012   Procedure: LEFT HEART CATHETERIZATION WITH CORONARY ANGIOGRAM;  Surgeon: Burnell Blanks, MD;  Location: [facility] CATH LAB;  Service: Cardiovascular;  Laterality: N/A;  . LEFT HEART CATHETERIZATION WITH CORONARY ANGIOGRAM N/A 04/12/2013   Procedure: LEFT HEART CATHETERIZATION WITH CORONARY ANGIOGRAM;  Surgeon: Peter M Martinique, MD;  Location: [facility] CATH LAB;  Service: Cardiovascular;  Laterality: N/A;  . PERCUTANEOUS CORONARY STENT INTERVENTION (PCI-S) N/A 04/29/2012   Procedure: PERCUTANEOUS CORONARY STENT INTERVENTION (PCI-S);  Surgeon: Peter M Martinique, MD;  Location: [facility] CATH LAB;  Service: Cardiovascular;  Laterality: N/A;  . SHOULDER ARTHROSCOPY  2012   left  . vaginal polypectomy  2004  . VENTRAL HERNIA REPAIR  2005    OB History    No data available       Home Medications    Prior to Admission medications   Medication Sig Start Date End Date Taking? Authorizing Provider  amLODipine (NORVASC) 5 MG tablet Take 5 mg by mouth daily.   Yes [provider]  aspirin EC 81 MG tablet Take 81 mg by mouth daily.   Yes [provider]  cyclophosphamide (CYTOXAN) 25 MG capsule Take 3 capsules (75 mg total) by mouth daily. Give on an empty stomach 1 hour before or 2 hours after meals. 01/22/17  Yes Regalado, Belkys A, MD  dapsone 100 MG tablet Take 100 mg by mouth at bedtime.    Yes [provider]  dicyclomine (BENTYL) 10 MG capsule Take 1 capsule (10 mg total) by mouth 3 (three) times daily as needed for spasms. 01/21/17  Yes Regalado, Belkys A, MD  famotidine (PEPCID) 20 MG tablet Take 1 tablet (20 mg total) by mouth 2 (two) times daily. 01/21/17  Yes Regalado, Belkys A, MD  isosorbide mononitrate (IMDUR) 60 MG 24 hr  tablet Take 60 mg by mouth at bedtime.   Yes [provider]  metoprolol tartrate (LOPRESSOR) 25 MG tablet Take 1 tablet (25 mg total) by mouth 3 (three) times daily. 01/22/17  Yes Regalado, Belkys A, MD  nitroGLYCERIN (NITROSTAT) 0.4 MG SL tablet Place 1 tablet (0.4 mg total) under the tongue every 5 (five) minutes x 3 doses as needed for chest pain. 08/03/14  Yes Eileen Stanford, PA-C  ondansetron (ZOFRAN) 4 MG tablet Take 1 tablet (4 mg total) by mouth every 8 (eight) hours as needed for nausea or vomiting. 01/08/17  Yes Irene Shipper, MD  predniSONE (DELTASONE) 10 MG tablet Take by mouth See admin instructions. Take as directed by doctor with a taper dose 02/13/17  Yes [provider]  sucralfate (CARAFATE) 1 GM/10ML suspension Take 1 g by mouth 2 (two) times daily as needed.   Yes [provider]  saccharomyces boulardii (FLORASTOR) 250 MG capsule Take 1 capsule (250 mg total) by mouth 2 (two) times daily. 01/21/17   Regalado, Belkys A, MD  warfarin (COUMADIN) 2.5 MG tablet Take 1 tablet (2.5 mg total) by mouth daily. Patient taking differently: Take 1.25-2.5 mg by mouth See admin instructions. Take 1.25mg  (1/2 tablet) by mouth on Monday, Wednesday, and Friday.  Take 1 tablet (2.5mg ) by mouth on Tuesday, Thursday, Saturday, and sunday 01/28/17   Martinique, Peter M, MD    Family History Family History  Problem Relation Age of Onset  . Breast cancer Sister   . Prostate cancer Father   . Colon polyps Father   . Heart disease Father 31       CAD, died of ESRD  . Kidney disease Father   . Colon cancer Sister 32  . Heart disease Mother 64       Died acute MI  . Heart disease Brother 6       CABG  . Esophageal cancer Neg Hx   . Rectal cancer Neg Hx   . Stomach cancer Neg Hx     Social History Social History  Substance Use Topics  . Smoking status: Never Smoker  . Smokeless tobacco: Never Used     Comment: second hand smoke   . Alcohol use No     Allergies    Brilinta [ticagrelor]; Lisinopril; Statins; Zetia [ezetimibe]; Macrodantin [nitrofurantoin macrocrystal]; and Sulfa antibiotics   Review of Systems Review of Systems  Constitutional: Positive for appetite change and fatigue.  HENT: Negative for congestion.   Respiratory: Negative for shortness of breath.   Cardiovascular: Negative for chest pain.  Gastrointestinal: Positive for nausea. Negative for abdominal pain.  Genitourinary: Negative for flank pain.  Musculoskeletal: Negative for back pain.  Skin: Negative for rash.  Neurological: Positive for syncope and light-headedness.  Hematological: Negative for adenopathy.  Psychiatric/Behavioral: Negative for confusion.     Physical Exam Updated Vital Signs BP 133/70   Pulse 65   Temp (!) 97.5 F (36.4 C) (Oral)   Resp 13   Ht 5\' 7"  (1.702 m)   Wt 86.2 kg (190 lb)   LMP  (LMP Unknown)   SpO2 96%   BMI 29.76 kg/m   Physical Exam  Constitutional: She appears well-developed.  HENT:  Head: Atraumatic.  Eyes: Pupils are equal, round, and reactive to light.  Neck: Neck supple.  Cardiovascular: Normal rate.   Abdominal: Soft. There is no tenderness.  Musculoskeletal: She exhibits no edema.  Neurological: She is alert.  Skin: Skin is warm. Capillary refill takes less than 2 seconds. There is pallor.  Psychiatric: She has a normal mood and affect.     ED Treatments / Results  Labs (all labs ordered are listed, but only abnormal results are displayed) Labs Reviewed  BASIC METABOLIC PANEL - Abnormal; Notable for the following:       Result Value   CO2 19 (*)    Glucose, Bld 176 (*)    BUN 97 (*)    Creatinine, Ser 4.61 (*)    Calcium 8.8 (*)    GFR calc non Af Amer 9 (*)    GFR calc Af Amer 10 (*)    All other components within normal limits  CBC - Abnormal; Notable for the following:    WBC 11.2 (*)  RBC 3.00 (*)    Hemoglobin 8.7 (*)    HCT 27.0 (*)    RDW 15.9 (*)    All other components within normal  limits  HEPATIC FUNCTION PANEL - Abnormal; Notable for the following:    Total Protein 6.0 (*)    Albumin 3.2 (*)    Total Bilirubin 1.8 (*)    Indirect Bilirubin 1.4 (*)    All other components within normal limits  LIPASE, BLOOD - Abnormal; Notable for the following:    Lipase 136 (*)    All other components within normal limits  TROPONIN I - Abnormal; Notable for the following:    Troponin I 0.03 (*)    All other components within normal limits  PROTIME-INR - Abnormal; Notable for the following:    Prothrombin Time 28.4 (*)    All other components within normal limits  I-STAT CG4 LACTIC ACID, ED - Abnormal; Notable for the following:    Lactic Acid, Venous 2.83 (*)    All other components within normal limits  URINALYSIS, ROUTINE W REFLEX MICROSCOPIC  CBG MONITORING, ED    EKG  EKG Interpretation  Date/Time:  Thursday February 26 2017 11:16:11 EDT Ventricular Rate:  63 PR Interval:    QRS Duration: 108 QT Interval:  377 QTC Calculation: 386 R Axis:   22 Text Interpretation:  Sinus rhythm Incomplete left bundle branch block Confirmed by Davonna Belling [phone]) on 02/26/2017 11:53:46 AM       Radiology Dg Chest 2 View  Result Date: 02/26/2017 CLINICAL DATA:  Chronic renal failure. The patient has been lethargic. EXAM: CHEST  2 VIEW COMPARISON:  January 16, 2017 FINDINGS: Elevation of the right hemidiaphragm persists. The heart, hila, mediastinum, lungs, and pleura are otherwise normal. No acute interval changes. IMPRESSION: No active cardiopulmonary disease. Electronically Signed   By: Dorise Bullion III M.D   On: 02/26/2017 12:39    Procedures Procedures (including critical care time)  Medications Ordered in ED Medications  sodium chloride 0.9 % bolus 1,000 mL (1,000 mLs Intravenous New Bag/Given 02/26/17 1246)     Initial Impression / Assessment and Plan / ED Course  I have reviewed the triage vital signs and the nursing notes.  Pertinent labs & imaging results  that were available during my care of the patient were reviewed by me and considered in my medical decision making (see chart for details).     Patient with generalized weakness decreased oral intake nausea. Labs are near baseline. Lactic acid minimally elevated. Troponin barely above normal. Lipase is also mildly elevated. Has not been tolerated orals and will benefit from from admission hospital.  Final Clinical Impressions(s) / ED Diagnoses   Final diagnoses:  Dehydration  Syncope, unspecified syncope type  Glomerulonephritis    New Prescriptions New Prescriptions   No medications on file     Davonna Belling, MD 02/26/17 1401

## 2017-02-26 NOTE — ED Notes (Signed)
Unsuccessful attempt

## 2017-02-26 NOTE — Consult Note (Signed)
Reason for Consult: Syncopal episode in patient with RPGN/ANCA vasculitis Referring Physician: Linna Darner M.D. 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 She was hospitalized between 7/10-7/19 for HAI on chronic kidney disease with a creatinine that peaked at 5.7 and improved partially to 4.0. After tissue diagnosis by renal biopsy, she was started on cyclophosphamide 75 mg daily and prednisone taper-currently 60 mg daily. Unfortunately, she has not done well since discharge from the hospital with decreasing appetite prompting a brief emergency room visit last week for volume resuscitation and antiemetic therapy.  When seen earlier today at the office by Dr. Hollie Salk, she reported to be feeling better although her family noted that she had been lethargic since earlier this week with diminishing oral intake. Also during the visit, she appeared to have had a syncopal episode with a blood pressure of 151/77 and palpable pulse. At that point, EMS were called and asked to transport her to the emergency room for further workup and management.  When seen in the emergency room, she expresses some frustration that she is still here waiting for a bed and is somewhat concerned that there is confusion regarding her management with intravenous fluids. She denies any chest pain or shortness of breath. Her urinalysis showed TNTC RBCs while metabolic panel showed potassium 5.1, bicarbonate 19 and creatinine 4.6 with a BUN of 97 (last week this was 4.6/91 respectively).  Past Medical History:  Diagnosis Date  . Arthritis   . Atrial fibrillation ([place])   . Borderline hypothyroidism   . CAD (coronary artery disease)    a. 04/2012 NSTEMI/Cath:  pLAD 90%, mLAD  70-80% (long) (3x16 & 2.5x28 Promus DES to p/m LAD), pD1 70-80%, pRI 30% (small), CFX 20%, OM1 40%, mOM2 30%, pRCA 30%, PDA 30-40%, pPLB 70%, then mid 90%, EF 65%;  b. 03/2013 Abnl CL w/apical isch;  c. 04/2013 Cath: LM nl, LAD patent stents, LCX <20, RCA 30p PDA 40-50 EF 55-60% ->Cont Med Rx.  c. CP s/p LHC with stable dz and patent stents--> Rx medically   . Cataract   . Dyspnea    a. CP and SOB 06/2012 => Ticagrelor changed to Plavix  . History of Clostridium difficile infection   . HLD (hyperlipidemia)    intolerant to statins  . Hx of adenomatous colonic polyps   . Hx of echocardiogram    a. Echo 2/14:  mild LVH, EF 55-65%, Gr 1 diast dysfn, mild LAE  . Hypertension   . Idiopathic pulmonary fibrosis   . Internal hemorrhoids   . Lung nodules   . Microscopic colitis   . Myocardial infarction ([place]) 2013  . Obesity   . OSA (obstructive sleep apnea)    a. on CPAP  . Sleep apnea    cpap    Past Surgical History:  Procedure Laterality Date  . APPENDECTOMY  1994  . BREAST BIOPSY  1965, 1975   bilateral  . CARDIAC CATHETERIZATION     x4  . CHOLECYSTECTOMY  2004  . CORONARY STENT PLACEMENT  04/2012   x2  . KNEE ARTHROSCOPY  2000   bilateral  . LEFT AND RIGHT HEART CATHETERIZATION WITH CORONARY ANGIOGRAM N/A 08/02/2014   Procedure: LEFT AND RIGHT HEART CATHETERIZATION WITH CORONARY ANGIOGRAM;  Surgeon: Burnell Blanks, MD;  Location: [facility] CATH LAB;  Service: Cardiovascular;  Laterality: N/A;  . LEFT HEART CATHETERIZATION WITH CORONARY ANGIOGRAM N/A 04/29/2012   Procedure: LEFT HEART CATHETERIZATION WITH CORONARY ANGIOGRAM;  Surgeon: Peter M Martinique, MD;  Location: [facility] CATH LAB;  Service: Cardiovascular;  Laterality: N/A;  . LEFT HEART CATHETERIZATION WITH CORONARY ANGIOGRAM N/A 08/03/2012   Procedure: LEFT HEART CATHETERIZATION WITH CORONARY ANGIOGRAM;  Surgeon: Burnell Blanks, MD;  Location: [facility] CATH LAB;  Service: Cardiovascular;  Laterality: N/A;  . LEFT HEART  CATHETERIZATION WITH CORONARY ANGIOGRAM N/A 04/12/2013   Procedure: LEFT HEART CATHETERIZATION WITH CORONARY ANGIOGRAM;  Surgeon: Peter M Martinique, MD;  Location: [facility] CATH LAB;  Service: Cardiovascular;  Laterality: N/A;  . PERCUTANEOUS CORONARY STENT INTERVENTION (PCI-S) N/A 04/29/2012   Procedure: PERCUTANEOUS CORONARY STENT INTERVENTION (PCI-S);  Surgeon: Peter M Martinique, MD;  Location: [facility] CATH LAB;  Service: Cardiovascular;  Laterality: N/A;  . SHOULDER ARTHROSCOPY  2012   left  . vaginal polypectomy  2004  . VENTRAL HERNIA REPAIR  2005    Family History  Problem Relation Age of Onset  . Breast cancer Sister   . Prostate cancer Father   . Colon polyps Father   . Heart disease Father 22       CAD, died of ESRD  . Kidney disease Father   . Colon cancer Sister 2  . Heart disease Mother 89       Died acute MI  . Heart disease Brother 64       CABG  . Esophageal cancer Neg Hx   . Rectal cancer Neg Hx   . Stomach cancer Neg Hx     Social History:  reports that she has never smoked. She has never used smokeless tobacco. She reports that she does not drink alcohol or use drugs.  Allergies:  Allergies  Allergen Reactions  . Brilinta [Ticagrelor] Shortness Of Breath, Other (See Comments) and Cough    Reaction:  Fatigue  . Lisinopril Shortness Of Breath and Cough  . Statins Other (See Comments)    Reaction:  Muscle pain/weakness  . Zetia [Ezetimibe] Other (See Comments)    Reaction:  Muscle pain/weakness   . Macrodantin [Nitrofurantoin Macrocrystal] Rash  . Sulfa Antibiotics Rash    Medications:  Scheduled: . [START ON 02/27/2017] amLODipine  5 mg Oral Daily  . [START ON 02/27/2017] aspirin EC  81 mg Oral Daily  . [START ON 02/27/2017] famotidine  20 mg Oral BID  . insulin aspart  0-5 Units Subcutaneous QHS  . [START ON 02/27/2017] insulin aspart  0-9 Units Subcutaneous TID WC  . isosorbide mononitrate  60 mg Oral QHS  . methylPREDNISolone (SOLU-MEDROL) injection  10 mg  Intravenous Daily  . metoprolol tartrate  25 mg Oral TID  . saccharomyces boulardii  250 mg Oral BID  . sodium chloride flush  3 mL Intravenous Q12H  . [START ON 02/27/2017] warfarin  2.5 mg Oral ONCE-1800  . [START ON 02/27/2017] Warfarin - Pharmacist Dosing Inpatient   Does not apply q1800    BMP Latest Ref Rng & Units 02/26/2017 02/18/2017 01/22/2017  Glucose 65 - 99 mg/dL 176(H) 165(H) 116(H)  BUN 6 - 20 mg/dL 97(H) 91(H) 65(H)  Creatinine 0.44 - 1.00 mg/dL 4.61(H) 4.59(H) 3.22(H)  Sodium 135 - 145 mmol/L 136 138 142  Potassium 3.5 - 5.1 mmol/L 5.1 3.8 3.8  Chloride 101 - 111 mmol/L 104 107 104  CO2 22 - 32 mmol/L 19(L) 22 29  Calcium 8.9 - 10.3 mg/dL 8.8(L) 8.3(L) 8.4(L)   CBC Latest Ref Rng &  Units 02/26/2017 02/18/2017 01/22/2017  WBC 4.0 - 10.5 K/uL 11.2(H) 11.3(H) -  Hemoglobin 12.0 - 15.0 g/dL 8.7(L) 9.6(L) 10.8(L)  Hematocrit 36.0 - 46.0 % 27.0(L) 29.3(L) -  Platelets 150 - 400 K/uL 269 214 -   Urinalysis    Component Value Date/Time   COLORURINE YELLOW 02/26/2017 1525   APPEARANCEUR CLEAR 02/26/2017 1525   LABSPEC 1.010 02/26/2017 1525   PHURINE 5.0 02/26/2017 1525   GLUCOSEU 50 (A) 02/26/2017 1525   HGBUR LARGE (A) 02/26/2017 1525   BILIRUBINUR NEGATIVE 02/26/2017 1525   KETONESUR NEGATIVE 02/26/2017 1525   PROTEINUR 100 (A) 02/26/2017 1525   NITRITE NEGATIVE 02/26/2017 1525   LEUKOCYTESUR TRACE (A) 02/26/2017 1525     Dg Chest 2 View  Result Date: 02/26/2017 CLINICAL DATA:  Chronic renal failure. The patient has been lethargic. EXAM: CHEST  2 VIEW COMPARISON:  January 16, 2017 FINDINGS: Elevation of the right hemidiaphragm persists. The heart, hila, mediastinum, lungs, and pleura are otherwise normal. No acute interval changes. IMPRESSION: No active cardiopulmonary disease. Electronically Signed   By: Dorise Bullion III M.D   On: 02/26/2017 12:39    Review of Systems  Constitutional: Positive for malaise/fatigue. Negative for chills and fever.  HENT: Negative.    Eyes: Negative.   Respiratory: Positive for shortness of breath. Negative for cough and hemoptysis.   Cardiovascular: Negative for chest pain, palpitations and leg swelling.  Gastrointestinal: Positive for nausea and vomiting. Negative for abdominal pain and diarrhea.  Genitourinary: Negative.   Musculoskeletal: Negative.   Skin: Negative.   Neurological: Positive for weakness.   Blood pressure 130/68, pulse 69, temperature (!) 97.5 F (36.4 C), temperature source Oral, resp. rate 13, height 5\' 7"  (1.702 m), weight 86.2 kg (190 lb), SpO2 95 %. Physical Exam  Nursing note and vitals reviewed. Constitutional: She is oriented to person, place, and time. She appears well-developed and well-nourished. No distress.  HENT:  Head: Normocephalic and atraumatic.  Mouth/Throat: Oropharynx is clear and moist.  Eyes: Pupils are equal, round, and reactive to light. EOM are normal. No scleral icterus.  Neck: Normal range of motion. Neck supple. No JVD present.  Cardiovascular: Normal rate.   Murmur heard. Irregularly irregular with ejection systolic murmur  Respiratory: Effort normal. She has no wheezes. She has no rales.  Coarse breath sounds-no distinct rales or rhonchi  GI: Soft. Bowel sounds are normal. She exhibits no distension. There is no tenderness. There is no rebound.  Musculoskeletal: She exhibits no edema.  Neurological: She is alert and oriented to person, place, and time.  Skin: Skin is warm and dry.    Assessment/Plan: 1. Syncopal event: Suspected to be hemodynamically mediated with diminished oral intake and ongoing GI losses with poor ability to tolerate ongoing cyclophosphamide therapy. Agree with intravenous fluid overnight at a judicious rate so as not to tip her over into volume overload. 2. RPGN/ANCA vasculitis: She appears to have been poorly tolerant of a cyclophosphamide-based regimen and per discussions with Dr. Hollie Salk, it is her intention to have her converted over to a  rituximab based regimen to hopefully circumvent her gastrointestinal side effects. Agree with) early converting her from oral prednisone to intravenous Solu-Medrol. 3. Anemia: Anemia chronic disease/recent acute illness, we'll check iron studies to decide on need for intravenous iron therapy/ESA supplementation. 4. Hypertension: The pressure acceptable at this point, will monitor with intravenous fluids and ongoing anti-hypertensive therapy. 5. Nausea/vomiting: Continue intravenous fluids at this time with supportive/symptomatic management of nausea/vomiting. Limit use of Carafate  to 48 hours in order to avoid any aluminum toxicity.  Kaidin Boehle K. 02/26/2017, 6:37 PM

## 2017-02-27 ENCOUNTER — Other Ambulatory Visit: Payer: Self-pay

## 2017-02-27 LAB — CBC
HCT: 26.1 % — ABNORMAL LOW (ref 36.0–46.0)
HEMATOCRIT: 22.2 % — AB (ref 36.0–46.0)
Hemoglobin: 7.1 g/dL — ABNORMAL LOW (ref 12.0–15.0)
Hemoglobin: 8.5 g/dL — ABNORMAL LOW (ref 12.0–15.0)
MCH: 29 pg (ref 26.0–34.0)
MCH: 28.9 pg (ref 26.0–34.0)
MCHC: 32 g/dL (ref 30.0–36.0)
MCHC: 32.6 g/dL (ref 30.0–36.0)
MCV: 90.6 fL (ref 78.0–100.0)
MCV: 88.8 fL (ref 78.0–100.0)
PLATELETS: 224 10*3/uL (ref 150–400)
Platelets: 254 10*3/uL (ref 150–400)
RBC: 2.45 MIL/uL — ABNORMAL LOW (ref 3.87–5.11)
RBC: 2.94 MIL/uL — ABNORMAL LOW (ref 3.87–5.11)
RDW: 15.7 % — AB (ref 11.5–15.5)
RDW: 16.3 % — AB (ref 11.5–15.5)
WBC: 9.1 10*3/uL (ref 4.0–10.5)
WBC: 8.7 10*3/uL (ref 4.0–10.5)

## 2017-02-27 LAB — COMPREHENSIVE METABOLIC PANEL
ALBUMIN: 2.7 g/dL — AB (ref 3.5–5.0)
ALT: 39 U/L (ref 14–54)
ANION GAP: 11 (ref 5–15)
AST: 31 U/L (ref 15–41)
Alkaline Phosphatase: 50 U/L (ref 38–126)
BILIRUBIN TOTAL: 1.4 mg/dL — AB (ref 0.3–1.2)
BUN: 85 mg/dL — ABNORMAL HIGH (ref 6–20)
CO2: 19 mmol/L — ABNORMAL LOW (ref 22–32)
Calcium: 8.5 mg/dL — ABNORMAL LOW (ref 8.9–10.3)
Chloride: 109 mmol/L (ref 101–111)
Creatinine, Ser: 4.17 mg/dL — ABNORMAL HIGH (ref 0.44–1.00)
GFR calc non Af Amer: 10 mL/min — ABNORMAL LOW (ref >60)
GFR, EST AFRICAN AMERICAN: 11 mL/min — AB (ref >60)
GLUCOSE: 146 mg/dL — AB (ref 65–99)
POTASSIUM: 4.7 mmol/L (ref 3.5–5.1)
SODIUM: 139 mmol/L (ref 135–145)
TOTAL PROTEIN: 4.9 g/dL — AB (ref 6.5–8.1)

## 2017-02-27 LAB — IRON AND TIBC
Iron: 109 ug/dL (ref 28–170)
SATURATION RATIOS: 43 % — AB (ref 10.4–31.8)
TIBC: 255 ug/dL (ref 250–450)
UIBC: 146 ug/dL

## 2017-02-27 LAB — VITAMIN B12: VITAMIN B 12: 116 pg/mL — AB (ref 180–914)

## 2017-02-27 LAB — FOLATE: FOLATE: 7 ng/mL (ref >5.9)

## 2017-02-27 LAB — PROTIME-INR
INR: 2.67
Prothrombin Time: 29 seconds — ABNORMAL HIGH (ref 11.4–15.2)

## 2017-02-27 LAB — TROPONIN I
TROPONIN I: 0.03 ng/mL — AB (ref <0.03)
TROPONIN I: 0.03 ng/mL — AB (ref <0.03)

## 2017-02-27 LAB — GLUCOSE, CAPILLARY
GLUCOSE-CAPILLARY: 125 mg/dL — AB (ref 65–99)
GLUCOSE-CAPILLARY: 229 mg/dL — AB (ref 65–99)
GLUCOSE-CAPILLARY: 128 mg/dL — AB (ref 65–99)
Glucose-Capillary: 173 mg/dL — ABNORMAL HIGH (ref 65–99)

## 2017-02-27 LAB — PREPARE RBC (CROSSMATCH)

## 2017-02-27 LAB — FERRITIN: Ferritin: 877 ng/mL — ABNORMAL HIGH (ref 11–307)

## 2017-02-27 LAB — LACTIC ACID, PLASMA: Lactic Acid, Venous: 1.5 mmol/L (ref 0.5–1.9)

## 2017-02-27 LAB — OSMOLALITY: OSMOLALITY: 318 mosm/kg — AB (ref 275–295)

## 2017-02-27 LAB — RETICULOCYTES
RBC.: 2.48 MIL/uL — ABNORMAL LOW (ref 3.87–5.11)
Retic Count, Absolute: 71.9 10*3/uL (ref 19.0–186.0)
Retic Ct Pct: 2.9 % (ref 0.4–3.1)

## 2017-02-27 LAB — HEMOGLOBIN AND HEMATOCRIT, BLOOD
HEMATOCRIT: 22.5 % — AB (ref 36.0–46.0)
Hemoglobin: 7.2 g/dL — ABNORMAL LOW (ref 12.0–15.0)

## 2017-02-27 MED ORDER — DIPHENHYDRAMINE HCL 50 MG/ML IJ SOLN
INTRAMUSCULAR | Status: AC
  Administered 2017-02-27: 50 mg
  Filled 2017-02-27: qty 1

## 2017-02-27 MED ORDER — PANTOPRAZOLE SODIUM 40 MG PO TBEC
40.0000 mg | DELAYED_RELEASE_TABLET | Freq: Every day | ORAL | Status: DC
  Filled 2017-02-27: qty 1

## 2017-02-27 MED ORDER — FAMOTIDINE 20 MG PO TABS: 20.0000 mg | ORAL_TABLET | Freq: Every day | ORAL | Status: DC

## 2017-02-27 MED ORDER — PREDNISOLONE 5 MG PO TABS: 50.0000 mg | ORAL_TABLET | Freq: Every day | ORAL | Status: DC

## 2017-02-27 MED ORDER — WARFARIN SODIUM 2.5 MG PO TABS
1.2500 mg | ORAL_TABLET | Freq: Once | ORAL | Status: AC
  Administered 2017-02-27: 1.25 mg via ORAL
  Filled 2017-02-27: qty 0.5

## 2017-02-27 MED ORDER — SODIUM CHLORIDE 0.9 % IV SOLN
Freq: Once | INTRAVENOUS | Status: AC
  Administered 2017-02-27: 15:00:00 via INTRAVENOUS

## 2017-02-27 MED ORDER — UNJURY CHICKEN SOUP POWDER
2.0000 [oz_av] | Freq: Two times a day (BID) | ORAL | Status: DC
  Administered 2017-02-27 – 2017-03-01 (×2): 2 [oz_av] via ORAL
  Filled 2017-02-27 (×7): qty 27

## 2017-02-27 MED ORDER — SODIUM CHLORIDE 0.9 % IV SOLN
375.0000 mg/m2 | Freq: Once | INTRAVENOUS | Status: AC
  Administered 2017-02-28: 700 mg via INTRAVENOUS
  Filled 2017-02-27 (×2): qty 70

## 2017-02-27 MED ORDER — PREDNISONE 50 MG PO TABS
50.0000 mg | ORAL_TABLET | Freq: Every day | ORAL | Status: DC
  Administered 2017-02-28 – 2017-03-02 (×3): 50 mg via ORAL
  Filled 2017-02-27 (×3): qty 1

## 2017-02-27 MED ORDER — DIPHENHYDRAMINE HCL 25 MG PO CAPS
25.0000 mg | ORAL_CAPSULE | Freq: Once | ORAL | Status: DC
  Filled 2017-02-27: qty 1

## 2017-02-27 MED ORDER — BOOST / RESOURCE BREEZE PO LIQD
1.0000 | Freq: Three times a day (TID) | ORAL | Status: DC
  Administered 2017-02-27: 1 via ORAL

## 2017-02-27 MED ORDER — ACETAMINOPHEN 325 MG PO TABS
650.0000 mg | ORAL_TABLET | Freq: Once | ORAL | Status: AC
  Administered 2017-02-27: 650 mg via ORAL
  Filled 2017-02-27: qty 2

## 2017-02-27 MED ORDER — FUROSEMIDE 10 MG/ML IJ SOLN
40.0000 mg | Freq: Once | INTRAMUSCULAR | Status: AC
  Administered 2017-02-27: 40 mg via INTRAVENOUS
  Filled 2017-02-27: qty 4

## 2017-02-27 NOTE — Progress Notes (Signed)
Subjective: Interval History: has complaints does not tol CTX well. Interested in [place]. .  Objective: Vital signs in last 24 hours: Temp:  [98.2 F (36.8 C)-98.6 F (37 C)] 98.6 F (37 C) (08/24 0533) Pulse Rate:  [63-129] 80 (08/24 0804) Resp:  [9-19] 18 (08/24 0533) BP: (102-147)/(49-84) 136/65 (08/24 0804) SpO2:  [89 %-100 %] 90 % (08/24 0804) Weight:  [84 kg (185 lb 3.2 oz)] 84 kg (185 lb 3.2 oz) (08/24 0011) Weight change:   Intake/Output from previous day: 08/23 0701 - 08/24 0700 In: 2390 [P.O.:120; I.V.:1770; IV Piggyback:500] Out: 700 [Urine:700] Intake/Output this shift: Total I/O In: 447 [P.O.:222; I.V.:225] Out: 500 [Urine:500]  General appearance: alert, cooperative, moderately obese and pale Resp: clear to auscultation bilaterally Cardio: S1, S2 normal and systolic murmur: systolic ejection 2/6, decrescendo at 2nd left intercostal space GI: obese, pos bs, soft Extremities: obese, pos bs, soft  Extrem Tr edema  Lab Results:  Recent Labs  02/26/17 1220 02/27/17 0605  WBC 11.2* 9.1  HGB 8.7* 7.1*  HCT 27.0* 22.2*  PLT 269 254   BMET:  Recent Labs  02/26/17 1220 02/27/17 0605  NA 136 139  K 5.1 4.7  CL 104 109  CO2 19* 19*  GLUCOSE 176* 146*  BUN 97* 85*  CREATININE 4.61* 4.17*  CALCIUM 8.8* 8.5*   No results for input(s): PTH in the last 72 hours. Iron Studies: No results for input(s): IRON, TIBC, TRANSFERRIN, FERRITIN in the last 72 hours.  Studies/Results: Dg Chest 2 View  Result Date: 02/26/2017 CLINICAL DATA:  Chronic renal failure. The patient has been lethargic. EXAM: CHEST  2 VIEW COMPARISON:  January 16, 2017 FINDINGS: Elevation of the right hemidiaphragm persists. The heart, hila, mediastinum, lungs, and pleura are otherwise normal. No acute interval changes. IMPRESSION: No active cardiopulmonary disease. Electronically Signed   By: Dorise Bullion III M.D   On: 02/26/2017 12:39    I have reviewed the patient's current  medications. Prior to Admission:  Prescriptions Prior to Admission  Medication Sig Dispense Refill Last Dose  . amLODipine (NORVASC) 5 MG tablet Take 5 mg by mouth daily.   02/26/2017 at Unknown time  . aspirin EC 81 MG tablet Take 81 mg by mouth daily.   02/26/2017 at Unknown time  . cyclophosphamide (CYTOXAN) 25 MG capsule Take 3 capsules (75 mg total) by mouth daily. Give on an empty stomach 1 hour before or 2 hours after meals. 90 capsule 0 02/25/2017 at Unknown time  . dapsone 100 MG tablet Take 100 mg by mouth at bedtime.    Past Week at Unknown time  . dicyclomine (BENTYL) 10 MG capsule Take 1 capsule (10 mg total) by mouth 3 (three) times daily as needed for spasms. 60 capsule 0 unknown  . famotidine (PEPCID) 20 MG tablet Take 1 tablet (20 mg total) by mouth 2 (two) times daily. 60 tablet 0 02/26/2017 at Unknown time  . isosorbide mononitrate (IMDUR) 60 MG 24 hr tablet Take 60 mg by mouth at bedtime.   02/25/2017 at Unknown time  . metoprolol tartrate (LOPRESSOR) 25 MG tablet Take 1 tablet (25 mg total) by mouth 3 (three) times daily. 120 tablet 0 02/26/2017 at 0700am  . nitroGLYCERIN (NITROSTAT) 0.4 MG SL tablet Place 1 tablet (0.4 mg total) under the tongue every 5 (five) minutes x 3 doses as needed for chest pain. 25 tablet 12 unknown  . ondansetron (ZOFRAN) 4 MG tablet Take 1 tablet (4 mg total) by mouth every 8 (  eight) hours as needed for nausea or vomiting. 30 tablet 1 Past Week at Unknown time  . predniSONE (DELTASONE) 10 MG tablet Take by mouth See admin instructions. Take as directed by doctor with a taper dose   02/25/2017 at Unknown time  . sucralfate (CARAFATE) 1 GM/10ML suspension Take 1 g by mouth 2 (two) times daily as needed.   unknown  . saccharomyces boulardii (FLORASTOR) 250 MG capsule Take 1 capsule (250 mg total) by mouth 2 (two) times daily. 30 capsule 0 Taking  . warfarin (COUMADIN) 2.5 MG tablet Take 1 tablet (2.5 mg total) by mouth daily. (Patient taking differently: Take  1.25-2.5 mg by mouth See admin instructions. Take 1.25mg  (1/2 tablet) by mouth on Monday, Wednesday, and Friday.  Take 1 tablet (2.5mg ) by mouth on Tuesday, Thursday, Saturday, and sunday) 30 tablet 1 Taking    Assessment/Plan: 1 CKD 4 . Improving, Mild vol decrease.   2 ANCA pos GN . Discussed with patient/Dr. Hollie Salk . Both wish to proceed with Rituxan.  Discussed SE including infusion rx, malig, infx, PML 3 HTN  4 Hx Afib 5 Obesity P Ritux, ivf, po steroids    LOS: 1 day   Nico Syme L 02/27/2017,12:06 PM

## 2017-02-27 NOTE — Progress Notes (Signed)
[facility] representative requested to clean room.  Daughter of pt replied "absolutely not."  Therefore, room was not cleaned today.

## 2017-02-27 NOTE — Progress Notes (Signed)
[place] for warfarin Indication: atrial fibrillation  Allergies  Allergen Reactions  . Brilinta [Ticagrelor] Shortness Of Breath, Other (See Comments) and Cough    Reaction:  Fatigue  . Lisinopril Shortness Of Breath and Cough  . Statins Other (See Comments)    Reaction:  Muscle pain/weakness  . Zetia [Ezetimibe] Other (See Comments)    Reaction:  Muscle pain/weakness   . Macrodantin [Nitrofurantoin Macrocrystal] Rash  . Sulfa Antibiotics Rash    Labs:  Recent Labs  02/26/17 1220 02/26/17 1311 02/26/17 1759 02/27/17 0018 02/27/17 0605  HGB 8.7*  --   --   --  7.1*  HCT 27.0*  --   --   --  22.2*  PLT 269  --   --   --  254  LABPROT  --  28.4*  --   --  29.0*  INR  --  2.60  --   --  2.67  CREATININE 4.61*  --   --   --  4.17*  TROPONINI  --   --  0.03* 0.03* 0.03*    Estimated Creatinine Clearance: 13.2 mL/min (A) (by C-G formula based on SCr of 4.17 mg/dL (H)).   Assessment: 76 yo F PMH obesity, MI, lung nodule, HLD, hypothyroidism and atrial fibrillation. Pharmacy consulted to dose warfarin for atrial fibrillation with RVR. No signs or symptoms of bleeding noted. INR therapeutic, 2.6 on home dose of warfarin.  PTA dose: warfarin 1.25mg  PO MWF, 2.5mg  all other days.   INR today = 2.67  Goal of Therapy:  INR 2-3 Monitor platelets by anticoagulation protocol: Yes   Plan:  Warfarin 1.25 mg PO x 1 as per home regimen Daily INR, CBCs F/u s/sx bleeding, PO intake, DDI  Thank you Anette Guarneri, PharmD [phone]  02/27/2017,11:40 AM

## 2017-02-27 NOTE — Progress Notes (Addendum)
Consent form for rituxan infusion has been placed in the chart.  Yellow chemo bucket + chemo gowns have been placed bedside per IV Team request.

## 2017-02-27 NOTE — Progress Notes (Signed)
PROGRESS NOTE    Jody Ruiz  GYK:[id number] DOB: [birth year] DOA: 02/26/2017 PCP: Burnard Bunting, MD    Brief Narrative:   Jody Ruiz is a 76 y.o. female with medical history significant of obesity, MI, lung nodule, hypertension, I DP, hyperlipidemia, hypothyroidism, atrial fibrillation. Patient reports a general decline in her overall health since January with more marked decline since July as patient's renal function has declined. Patient endorses persistent nausea since starting Cytoxan for treatment of ANCA glomerulonephritis. Patient reports very little orally intubated over the last 1-2 weeks. Only over the last 1-2 days. Patient was seen and her nephrologist's office on day of admission for routine evaluation when she had a syncopal episode in her chair. Patient never lost a pulse and never stopped breathing. She was assisted from her chair to the floor. Regained consciousness after approximately 1 minute. No reports of loss of bowel or bladder function, seizure type activity, tongue biting, or post syncopal confusion or somnolence.  No recently reported dysuria, frequency, flank pain, focal neurological deficits, headache, neck stiffness, cough, chest pain, shortness of breath.    Assessment & Plan:   Active Problems:   Tachycardia-bradycardia syndrome ([place])   Acute kidney injury superimposed on chronic kidney disease (HCC)   Glomerulonephritis   Syncope   PAF (paroxysmal atrial fibrillation) (HCC)   Lactic acidemia   Chronic diastolic CHF (congestive heart failure) (HCC)   Diabetes mellitus with complication (HCC)  #1 syncope Likely secondary to volume depletion secondary to decreased oral intake and ongoing GI losses. Patient with no further syncopal episodes. Chest x-ray negative for any acute infiltrate. Patient with no overt bleeding. Patient with no signs or symptoms of an acute infectious etiology. Patient with no focal neurological deficits. Patient on IV  fluids. Follow.  #2 anemia Likely secondary to anemia of chronic disease. Patient with no overt bleeding. Decreasing hemoglobin likely secondary to RBCs noted in urinalysis as well as dilutional effect. Hemoglobin currently at 7.1. Check an anemia panel. Transfuse 1 unit packed red blood cells. Follow H&H.  #3 RPGN/ANA vasculitis Patient unable to tolerate cyclophosphamide-based regimen. Patient has been seen in consultation by nephrology who have discussed with patient's primary nephrologist Dr. Hollie Salk and patient being changed over to rituximab regimen.  #4 hypertension Stable. Continue Norvasc, Imdur and Lopressor.  #5 atrial fibrillation Currently rate controlled on Lopressor. Coumadin for anticoagulation.  #6 nausea/vomiting Likely secondary to intolerance to cyclophosphamide-based regimen. IV fluids. Supportive care.  #7 diabetes mellitus Continue sliding scale insulin.  #9 gastroesophageal reflux disease Patient status post upper endoscopy which was unremarkable. Patient refusing PPI and H2 blocker. Discontinue PPI and H2 blocker per patient request.  #79 chronic diastolic heart failure Stable. Euvolemic.  #11 abnormal urinalysis.  Patient denies any dysuria. Large amount of blood noted in urine. Negative for ketones on nitrates. Likely secondary to kidney injury. Follow.  DVT prophylaxis: SCD Code Status: Full Family Communication: Updated patient and daughter at bedside. Disposition Plan: Per nephrology.   Consultants:   Nephrology: Dr. Graylon Gunning 02/26/2017  Procedures:   Transfuse 1 unit packed red blood cells 02/27/2017  Chest x-ray 02/26/2017  Antimicrobials:   None   Subjective: Patient laying in bed. No further syncopal episodes. No chest pain. No shortness of breath. Patient with some complaints of nausea and emesis.  Objective: Vitals:   02/27/17 0534 02/27/17 0538 02/27/17 0541 02/27/17 0804  BP: 129/63 133/75 (!) 144/70 136/65  Pulse: 84 (!) 116  (!) 129 80  Resp:  Temp:      TempSrc:      SpO2:    90%  Weight:      Height:        Intake/Output Summary (Last 24 hours) at 02/27/17 1216 Last data filed at 02/27/17 1141  Gross per 24 hour  Intake             2837 ml  Output             1200 ml  Net             1637 ml   Filed Weights   02/26/17 1119 02/27/17 0011  Weight: 86.2 kg (190 lb) 84 kg (185 lb 3.2 oz)    Examination:  General exam: Pale. Respiratory system: Clear to auscultation. No wheezes. No crackles. No rhonchi. Respiratory effort normal. Cardiovascular system: S1 & S2 heard, RRR. No JVD, murmurs, rubs, gallops or clicks. No pedal edema. Gastrointestinal system: Abdomen is nondistended, soft and nontender. No organomegaly or masses felt. Normal bowel sounds heard. Central nervous system: Alert and oriented. No focal neurological deficits. Extremities: Symmetric 5 x 5 power. Skin: No rashes, lesions or ulcers Psychiatry: Judgement and insight appear normal. Mood & affect appropriate.     Data Reviewed: I have personally reviewed following labs and imaging studies  CBC:  Recent Labs Lab 02/26/17 1220 02/27/17 0605  WBC 11.2* 9.1  HGB 8.7* 7.1*  HCT 27.0* 22.2*  MCV 90.0 90.6  PLT 269 638   Basic Metabolic Panel:  Recent Labs Lab 02/26/17 1220 02/27/17 0605  NA 136 139  K 5.1 4.7  CL 104 109  CO2 19* 19*  GLUCOSE 176* 146*  BUN 97* 85*  CREATININE 4.61* 4.17*  CALCIUM 8.8* 8.5*   GFR: Estimated Creatinine Clearance: 13.2 mL/min (A) (by C-G formula based on SCr of 4.17 mg/dL (H)). Liver Function Tests:  Recent Labs Lab 02/26/17 1201 02/27/17 0605  AST 30 31  ALT 37 39  ALKPHOS 60 50  BILITOT 1.8* 1.4*  PROT 6.0* 4.9*  ALBUMIN 3.2* 2.7*    Recent Labs Lab 02/26/17 1201  LIPASE 136*   No results for input(s): AMMONIA in the last 168 hours. Coagulation Profile:  Recent Labs Lab 02/23/17 1139 02/26/17 1311 02/27/17 0605  INR 5.0 2.60 2.67   Cardiac  Enzymes:  Recent Labs Lab 02/26/17 1201 02/26/17 1759 02/27/17 0018 02/27/17 0605  TROPONINI 0.03* 0.03* 0.03* 0.03*   BNP (last 3 results) No results for input(s): PROBNP in the last 8760 hours. HbA1C: No results for input(s): HGBA1C in the last 72 hours. CBG:  Recent Labs Lab 02/26/17 2233 02/27/17 0803 02/27/17 1204  GLUCAP 185* 125* 173*   Lipid Profile: No results for input(s): CHOL, HDL, LDLCALC, TRIG, CHOLHDL, LDLDIRECT in the last 72 hours. Thyroid Function Tests: No results for input(s): TSH, T4TOTAL, FREET4, T3FREE, THYROIDAB in the last 72 hours. Anemia Panel: No results for input(s): VITAMINB12, FOLATE, FERRITIN, TIBC, IRON, RETICCTPCT in the last 72 hours. Sepsis Labs:  Recent Labs Lab 02/26/17 1259 02/27/17 1057  LATICACIDVEN 2.83* 1.5    No results found for this or any previous visit (from the past 240 hour(s)).       Radiology Studies: Dg Chest 2 View  Result Date: 02/26/2017 CLINICAL DATA:  Chronic renal failure. The patient has been lethargic. EXAM: CHEST  2 VIEW COMPARISON:  January 16, 2017 FINDINGS: Elevation of the right hemidiaphragm persists. The heart, hila, mediastinum, lungs, and pleura are otherwise normal. No acute interval  changes. IMPRESSION: No active cardiopulmonary disease. Electronically Signed   By: Dorise Bullion III M.D   On: 02/26/2017 12:39        Scheduled Meds: . acidophilus  1 capsule Oral BID  . amLODipine  5 mg Oral Daily  . aspirin EC  81 mg Oral Daily  . famotidine  20 mg Oral BID  . feeding supplement  1 Container Oral TID BM  . insulin aspart  0-5 Units Subcutaneous QHS  . insulin aspart  0-9 Units Subcutaneous TID WC  . isosorbide mononitrate  60 mg Oral QHS  . metoprolol tartrate  25 mg Oral TID  . pantoprazole  40 mg Oral Q0600  . prednisoLONE  50 mg Oral Daily  . riTUXimab (RITUXAN) IV infusion  375 mg/m2 Intravenous Once  . sodium chloride flush  3 mL Intravenous Q12H  . warfarin  1.25 mg Oral  ONCE-1800  . warfarin  2.5 mg Oral ONCE-1800  . Warfarin - Pharmacist Dosing Inpatient   Does not apply q1800   Continuous Infusions: . sodium chloride 75 mL/hr at 02/27/17 0809     LOS: 1 day    Time spent: 37 minutes    Mahoganie Basher, MD [facility] Pager [phone]  If 7PM-7AM, please contact night-coverage [email or web address] Password [facility] 02/27/2017, 12:16 PM

## 2017-02-27 NOTE — Progress Notes (Signed)
Initial Nutrition Assessment  DOCUMENTATION CODES:   Severe malnutrition in context of acute illness/injury  INTERVENTION:  - Discontinue Boost Breeze - Provide Unjury (chicken soup flavor) BID, each supplement provides 100 kcal and 21 grams protein - Encourage PO intake  NUTRITION DIAGNOSIS:   Malnutrition (Severe) related to acute illness (Persistent nausea) as evidenced by energy intake < or equal to 50% for > or equal to 5 days, percent weight loss (9% in 1 month).  GOAL:   Patient will meet greater than or equal to 90% of their needs  MONITOR:   PO intake, Supplement acceptance, Weight trends, Diet advancement, I & O's  REASON FOR ASSESSMENT:   Malnutrition Screening Tool    ASSESSMENT:   Pt with PMH of HLD, HTN, hypothyroidism, AFIB, lung nodule, internal hemorrhoids, OSA, presents with persistent nausea s/p syncopal episode  Spoke with pt and family at bedside. Family reports tracking pt's meals over the past weeks. Per report pt has only been consuming 500-600 kcal per day. Per chart review, pt meal completion is 25% during current admission.  Pt and family at bedside report significant recent weight loss. Per family report pt's UBW is 220-230 lb, stating she weighed this in 07/2016. Per chart review pt has experienced a downward trend in weight over the past couple months. Per weight history, pt has 9% wt loss in 1 month, significant for time frame.   Pt and family report intermittent nausea since July and overall weakness. Per family report the nausea has steadly increased over the past 2 weeks and has impacted PO intake.   Pt reports disliking all boost and nutritional supplement products. However, pt amenable to Unjury chicken soup flavor as pt states she enjoys soup.   Nutrition focused physical exam completed. Findings include no fat depletion, mild muscle depletion and no edema.    Labs reviewed; CBG 125-185 Medications reviewed; Sliding scale insulin,  Coumadin, Rituxan, Prednisolone, Phenergan  Diet Order:  Diet clear liquid Room service appropriate? Yes; Fluid consistency: Thin  Skin:  Reviewed, no issues  Last BM:  Unknown BM date  Height:   Ht Readings from Last 1 Encounters:  02/27/17 5\' 7"  (1.702 m)    Weight:   Wt Readings from Last 1 Encounters:  02/27/17 185 lb 3.2 oz (84 kg)    Ideal Body Weight:  61.4 kg  BMI:  Body mass index is 29.01 kg/m.  Estimated Nutritional Needs:   Kcal:  1800-2000  Protein:  95- 110 grams  Fluid:  >/= 1.7 L/d  EDUCATION NEEDS:   Education needs no appropriate at this time  Parks Ranger, RDN 02/27/2017 2:03 PM

## 2017-02-28 DIAGNOSIS — E538 Deficiency of other specified B group vitamins: Secondary | ICD-10-CM

## 2017-02-28 DIAGNOSIS — I495 Sick sinus syndrome: Secondary | ICD-10-CM

## 2017-02-28 LAB — GLUCOSE, CAPILLARY
GLUCOSE-CAPILLARY: 95 mg/dL (ref 65–99)
GLUCOSE-CAPILLARY: 144 mg/dL — AB (ref 65–99)
Glucose-Capillary: 252 mg/dL — ABNORMAL HIGH (ref 65–99)
Glucose-Capillary: 242 mg/dL — ABNORMAL HIGH (ref 65–99)

## 2017-02-28 LAB — RENAL FUNCTION PANEL
ALBUMIN: 2.6 g/dL — AB (ref 3.5–5.0)
ANION GAP: 8 (ref 5–15)
BUN: 79 mg/dL — AB (ref 6–20)
CO2: 21 mmol/L — ABNORMAL LOW (ref 22–32)
Calcium: 8.2 mg/dL — ABNORMAL LOW (ref 8.9–10.3)
Chloride: 108 mmol/L (ref 101–111)
Creatinine, Ser: 3.69 mg/dL — ABNORMAL HIGH (ref 0.44–1.00)
GFR, EST AFRICAN AMERICAN: 13 mL/min — AB (ref >60)
GFR, EST NON AFRICAN AMERICAN: 11 mL/min — AB (ref >60)
Glucose, Bld: 112 mg/dL — ABNORMAL HIGH (ref 65–99)
PHOSPHORUS: 4.8 mg/dL — AB (ref 2.5–4.6)
POTASSIUM: 4.3 mmol/L (ref 3.5–5.1)
Sodium: 137 mmol/L (ref 135–145)

## 2017-02-28 LAB — PROTIME-INR
INR: 2.81
Prothrombin Time: 30.2 seconds — ABNORMAL HIGH (ref 11.4–15.2)

## 2017-02-28 LAB — TYPE AND SCREEN
ABO/RH(D): B POS
Antibody Screen: NEGATIVE
Unit division: 0

## 2017-02-28 LAB — BPAM RBC
BLOOD PRODUCT EXPIRATION DATE: 201809042359
ISSUE DATE / TIME: 201808241433
Unit Type and Rh: 7300

## 2017-02-28 LAB — CBC
HEMATOCRIT: 24.3 % — AB (ref 36.0–46.0)
HEMOGLOBIN: 8.1 g/dL — AB (ref 12.0–15.0)
MCH: 29.2 pg (ref 26.0–34.0)
MCHC: 33.3 g/dL (ref 30.0–36.0)
MCV: 87.7 fL (ref 78.0–100.0)
Platelets: 224 10*3/uL (ref 150–400)
RBC: 2.77 MIL/uL — AB (ref 3.87–5.11)
RDW: 16.7 % — AB (ref 11.5–15.5)
WBC: 9.2 10*3/uL (ref 4.0–10.5)

## 2017-02-28 LAB — URINE CULTURE

## 2017-02-28 MED ORDER — DIPHENHYDRAMINE HCL 50 MG/ML IJ SOLN
INTRAMUSCULAR | Status: AC
  Filled 2017-02-28: qty 1

## 2017-02-28 MED ORDER — WARFARIN SODIUM 1 MG PO TABS
1.0000 mg | ORAL_TABLET | Freq: Once | ORAL | Status: AC
  Administered 2017-02-28: 1 mg via ORAL
  Filled 2017-02-28: qty 0.5
  Filled 2017-02-28: qty 1

## 2017-02-28 MED ORDER — EPINEPHRINE PF 1 MG/10ML IJ SOSY
PREFILLED_SYRINGE | INTRAMUSCULAR | Status: AC
  Filled 2017-02-28: qty 10

## 2017-02-28 MED ORDER — ACETAMINOPHEN 325 MG PO TABS
650.0000 mg | ORAL_TABLET | Freq: Once | ORAL | Status: AC
  Administered 2017-02-28: 650 mg via ORAL
  Filled 2017-02-28: qty 2

## 2017-02-28 MED ORDER — DILTIAZEM HCL 100 MG IV SOLR
5.0000 mg/h | INTRAVENOUS | Status: DC
  Filled 2017-02-28: qty 100

## 2017-02-28 MED ORDER — DIPHENHYDRAMINE HCL 50 MG/ML IJ SOLN
25.0000 mg | Freq: Once | INTRAMUSCULAR | Status: AC
  Administered 2017-02-28: 25 mg via INTRAVENOUS
  Filled 2017-02-28: qty 1

## 2017-02-28 MED ORDER — CYANOCOBALAMIN 1000 MCG/ML IJ SOLN
1000.0000 ug | Freq: Every day | INTRAMUSCULAR | Status: DC
  Administered 2017-02-28 – 2017-03-02 (×3): 1000 ug via INTRAMUSCULAR
  Filled 2017-02-28 (×4): qty 1

## 2017-02-28 MED ORDER — DILTIAZEM LOAD VIA INFUSION
10.0000 mg | Freq: Once | INTRAVENOUS | Status: DC
  Filled 2017-02-28: qty 10

## 2017-02-28 MED ORDER — METHYLPREDNISOLONE SODIUM SUCC 125 MG IJ SOLR
INTRAMUSCULAR | Status: AC
  Filled 2017-02-28: qty 2

## 2017-02-28 NOTE — Progress Notes (Signed)
The role of the IV nurse is to provide educational support for both the patient and the nurse prior to beginning the infusion. The IV nurse will also initiate the Rituxan and assess for any reactions during the first 1-2 titrations. Once the infusion as has been initiated it's the primary nurses responsibility to provide Q 43min standard nursing assessments for medication reactions as well as checking the patients vial signs.   Prior to the infusion, education was provided by the IV nurse to both the patient and the family member. Dr.Detterding was also at the bedside to provide additional education and answer any questions. This patient is alert and oriented and was able to sign a consent giving permission to receive this medication. The current IV in the right posterior forearm is patent with great blood return.  Vitals signs: 130/75, 107, 97%(room air). The Rituxan infusion was then started by the IV nurse.  This patients nurse, Gabriel Cirri RN has been aware that the infusion has begun and that this patient will require a standard nursing assessment for reaction and vials should be assessed Q 24min with each titration. The written hypersentivity reaction protocol has also been left with Gabriel Cirri RN. Rip Harbour RN has also received the hypersensitivity protocol and will assist the primary nurse with caring for this patient while receiving Rituxan.  Catalina Pizza

## 2017-02-28 NOTE — Progress Notes (Signed)
As requested via IV Team, benadryl and tylenol given to pt pre-chem IV infusion at 0932, as IV chemo infusion is scheduled to be given at 1000. As requested via IV Team, 2nd IV pump placed on IV pole in pt room.

## 2017-02-28 NOTE — Progress Notes (Addendum)
Rapid Afib 140's, patient denies any pain or SOB. Bp , o2 sats stable. Denies nausea.Dr. Grandville Silos notified. 12 lead Ekg and 25 mg Po lopressor orderd at this time.MD also aware patient receiving Rituxan infusion.

## 2017-02-28 NOTE — Progress Notes (Signed)
PROGRESS NOTE    Jody Ruiz  ALP:[id number] DOB: 1940/08/25 DOA: 02/26/2017 PCP: Burnard Bunting, MD    Brief Narrative:   Jody Ruiz is a 76 y.o. female with medical history significant of obesity, MI, lung nodule, hypertension, I DP, hyperlipidemia, hypothyroidism, atrial fibrillation. Patient reports a general decline in her overall health since January with more marked decline since July as patient's renal function has declined. Patient endorses persistent nausea since starting Cytoxan for treatment of ANCA glomerulonephritis. Patient reports very little orally intubated over the last 1-2 weeks. Only over the last 1-2 days. Patient was seen and her nephrologist's office on day of admission for routine evaluation when she had a syncopal episode in her chair. Patient never lost a pulse and never stopped breathing. She was assisted from her chair to the floor. Regained consciousness after approximately 1 minute. No reports of loss of bowel or bladder function, seizure type activity, tongue biting, or post syncopal confusion or somnolence.  No recently reported dysuria, frequency, flank pain, focal neurological deficits, headache, neck stiffness, cough, chest pain, shortness of breath.    Assessment & Plan:   Active Problems:   B12 deficiency   Tachycardia-bradycardia syndrome (HCC)   Acute kidney injury superimposed on chronic kidney disease (HCC)   Glomerulonephritis   Syncope   PAF (paroxysmal atrial fibrillation) (HCC)   Lactic acidemia   Chronic diastolic CHF (congestive heart failure) (HCC)   Diabetes mellitus with complication (HCC)  #1 syncope Likely secondary to volume depletion secondary to decreased oral intake and ongoing GI losses. Patient with no further syncopal episodes. Chest x-ray negative for any acute infiltrate. Patient with no overt bleeding. Patient with no signs or symptoms of an acute infectious etiology. Patient with no focal neurological  deficits. Supportive care. Follow.  #2 anemia Likely secondary to anemia of chronic disease. Patient with no overt bleeding. Decreasing hemoglobin likely secondary to RBCs noted in urinalysis as well as dilutional effect. Patient is status post 1 unit packed red blood cells hemoglobin currently at 8.1 from 7.1. Follow H&H.  #3 RPGN/ANA vasculitis Patient unable to tolerate cyclophosphamide-based regimen. Patient has been seen in consultation by nephrology who have discussed with patient's primary nephrologist Dr. Hollie Salk and patient has been started on rituximab by nephrology. Outpatient follow-up with Dr. Hollie Salk.  #4 hypertension Stable. Continue Norvasc, Imdur and Lopressor.  #5 atrial fibrillation Rate elevated in the 140s during infusion. Patient asymptomatic. Will give 4 PM dose of Lopressor early and if no improvement with heart rate will place on a Cardizem drip. INR is 2.81. Coumadin for anticoagulation.  #6 nausea/vomiting Likely secondary to intolerance to cyclophosphamide-based regimen. IV fluids. Patient with no further nausea or emesis. Will advance diet to a soft diet. Supportive care.  #7 diabetes mellitus CBGs have ranged from 95 -144. Continue sliding scale insulin.  #9 gastroesophageal reflux disease Patient status post upper endoscopy which was unremarkable. Patient refusing PPI and H2 blocker. Discontinued PPI and H2 blocker per patient request.  #35 chronic diastolic heart failure Stable. Euvolemic.  #11 abnormal urinalysis.  Patient denies any dysuria. Large amount of blood noted in urine. Negative for ketones on nitrates. Likely secondary to kidney injury. Follow.  #12 vitamin B 12 deficiency Place on vitamin B-12 IM daily.  DVT prophylaxis: SCD Code Status: Full Family Communication: Updated patient and daughter at bedside. Disposition Plan: Per nephrology.   Consultants:   Nephrology: Dr. Graylon Gunning 02/26/2017  Procedures:   Transfused 2 unit packed red  blood  cells 02/27/2017  Chest x-ray 02/26/2017  Antimicrobials:   None   Subjective: Patient states feeling some what better. No dizziness. No further syncopal episodes. Tolerating current diet. No nausea or emesis. Patient currently getting infusion of rituximab.   Objective: Vitals:   02/28/17 1230 02/28/17 1235 02/28/17 1240 02/28/17 1245  BP: 131/68 130/80 133/74 119/83  Pulse: (!) 119 (!) 125 (!) 113 (!) 126  Resp: 20 (!) 21  18  Temp: 98.1 F (36.7 C)   (!) 97.2 F (36.2 C)  TempSrc: Oral   Oral  SpO2: 93% 94% 93% 94%  Weight:      Height:        Intake/Output Summary (Last 24 hours) at 02/28/17 1305 Last data filed at 02/28/17 1036  Gross per 24 hour  Intake             4922 ml  Output             2576 ml  Net             2346 ml   Filed Weights   02/26/17 1119 02/27/17 0011 02/28/17 0710  Weight: 86.2 kg (190 lb) 84 kg (185 lb 3.2 oz) 84.2 kg (185 lb 11.2 oz)    Examination:  General exam: Pale. Respiratory system: Clear to auscultation anterior lung fields. No wheezes. No crackles. No rhonchi. Respiratory effort normal. Cardiovascular system: Irregularly irregular. No JVD, murmurs, rubs, gallops or clicks. No pedal edema. Gastrointestinal system: Abdomen is soft, nontender, nondistended, no hepatosplenomegaly, positive bowel sounds.  Central nervous system: Alert and oriented. No focal neurological deficits. Extremities: Symmetric 5 x 5 power. Skin: No rashes, lesions or ulcers Psychiatry: Judgement and insight appear normal. Mood & affect Flat.     Data Reviewed: I have personally reviewed following labs and imaging studies  CBC:  Recent Labs Lab 02/26/17 1220 02/27/17 0605 02/27/17 1230 02/27/17 1940 02/28/17 0424  WBC 11.2* 9.1  --  8.7 9.2  HGB 8.7* 7.1* 7.2* 8.5* 8.1*  HCT 27.0* 22.2* 22.5* 26.1* 24.3*  MCV 90.0 90.6  --  88.8 87.7  PLT 269 254  --  224 834   Basic Metabolic Panel:  Recent Labs Lab 02/26/17 1220 02/27/17 0605  02/28/17 0424  NA 136 139 137  K 5.1 4.7 4.3  CL 104 109 108  CO2 19* 19* 21*  GLUCOSE 176* 146* 112*  BUN 97* 85* 79*  CREATININE 4.61* 4.17* 3.69*  CALCIUM 8.8* 8.5* 8.2*  PHOS  --   --  4.8*   GFR: Estimated Creatinine Clearance: 14.9 mL/min (A) (by C-G formula based on SCr of 3.69 mg/dL (H)). Liver Function Tests:  Recent Labs Lab 02/26/17 1201 02/27/17 0605 02/28/17 0424  AST 30 31  --   ALT 37 39  --   ALKPHOS 60 50  --   BILITOT 1.8* 1.4*  --   PROT 6.0* 4.9*  --   ALBUMIN 3.2* 2.7* 2.6*    Recent Labs Lab 02/26/17 1201  LIPASE 136*   No results for input(s): AMMONIA in the last 168 hours. Coagulation Profile:  Recent Labs Lab 02/23/17 1139 02/26/17 1311 02/27/17 0605 02/28/17 0424  INR 5.0 2.60 2.67 2.81   Cardiac Enzymes:  Recent Labs Lab 02/26/17 1201 02/26/17 1759 02/27/17 0018 02/27/17 0605  TROPONINI 0.03* 0.03* 0.03* 0.03*   BNP (last 3 results) No results for input(s): PROBNP in the last 8760 hours. HbA1C: No results for input(s): HGBA1C in the last 72 hours. CBG:  Recent Labs Lab 02/27/17 1204 02/27/17 1720 02/27/17 2227 02/28/17 0735 02/28/17 1204  GLUCAP 173* 229* 128* 95 144*   Lipid Profile: No results for input(s): CHOL, HDL, LDLCALC, TRIG, CHOLHDL, LDLDIRECT in the last 72 hours. Thyroid Function Tests: No results for input(s): TSH, T4TOTAL, FREET4, T3FREE, THYROIDAB in the last 72 hours. Anemia Panel:  Recent Labs  02/27/17 1230  VITAMINB12 116*  FOLATE 7.0  FERRITIN 877*  TIBC 255  IRON 109  RETICCTPCT 2.9   Sepsis Labs:  Recent Labs Lab 02/26/17 1259 02/27/17 1057  LATICACIDVEN 2.83* 1.5    Recent Results (from the past 240 hour(s))  Urine culture     Status: Abnormal   Collection Time: 02/26/17  3:25 PM  Result Value Ref Range Status   Specimen Description URINE, RANDOM  Final   Special Requests NONE  Final   Culture <10,000 COLONIES/mL (A)  Final   Report Status 02/28/2017 FINAL  Final           Radiology Studies: No results found.      Scheduled Meds: . diphenhydrAMINE      . EPINEPHrine      . methylPREDNISolone sodium succinate      . amLODipine  5 mg Oral Daily  . aspirin EC  81 mg Oral Daily  . cyanocobalamin  1,000 mcg Intramuscular Daily  . diphenhydrAMINE  25 mg Oral Once  . insulin aspart  0-5 Units Subcutaneous QHS  . insulin aspart  0-9 Units Subcutaneous TID WC  . isosorbide mononitrate  60 mg Oral QHS  . metoprolol tartrate  25 mg Oral TID  . predniSONE  50 mg Oral Q breakfast  . protein supplement  2 oz Oral BID WC  . sodium chloride flush  3 mL Intravenous Q12H  . Warfarin - Pharmacist Dosing Inpatient   Does not apply q1800   Continuous Infusions: . sodium chloride 75 mL/hr at 02/28/17 0218     LOS: 2 days    Time spent: 35 minutes    Leronda Lewers, MD [facility] Pager [phone]  If 7PM-7AM, please contact night-coverage [email or web address] Password [email or web address] 02/28/2017, 1:05 PM

## 2017-02-28 NOTE — Progress Notes (Signed)
CCMD notified me that pt has converted to a-fib, sustaining in the 130s.  RN that is administering chemo drug infusion has been notified.  MD has been notified.

## 2017-02-28 NOTE — Progress Notes (Addendum)
MD and Rip Harbour, RN are bedside and assessing pt d/t recent a-fib with RVR conversion.  Orders received to give 1600 scheduled Metoprolol dose now.

## 2017-02-28 NOTE — Progress Notes (Signed)
ANTICOAGULATION CONSULT NOTE - Follow Up Consult  Pharmacy Consult for Coumadin Indication: atrial fibrillation  Allergies  Allergen Reactions  . Brilinta [Ticagrelor] Shortness Of Breath, Other (See Comments) and Cough    Reaction:  Fatigue  . Lisinopril Shortness Of Breath and Cough  . Statins Other (See Comments)    Reaction:  Muscle pain/weakness  . Zetia [Ezetimibe] Other (See Comments)    Reaction:  Muscle pain/weakness   . Macrodantin [Nitrofurantoin Macrocrystal] Rash  . Sulfa Antibiotics Rash    Patient Measurements: Height: 5\' 7"  (170.2 cm) Weight: 185 lb 11.2 oz (84.2 kg) (b scale) IBW/kg (Calculated) : 61.6   Vital Signs: Temp: 98.6 F (37 C) (08/25 1400) Temp Source: Oral (08/25 1400) BP: 110/60 (08/25 1400) Pulse Rate: 104 (08/25 1415)  Labs:  Recent Labs  02/26/17 1220 02/26/17 1311 02/26/17 1759 02/27/17 0018 02/27/17 0605 02/27/17 1230 02/27/17 1940 02/28/17 0424  HGB 8.7*  --   --   --  7.1* 7.2* 8.5* 8.1*  HCT 27.0*  --   --   --  22.2* 22.5* 26.1* 24.3*  PLT 269  --   --   --  254  --  224 224  LABPROT  --  28.4*  --   --  29.0*  --   --  30.2*  INR  --  2.60  --   --  2.67  --   --  2.81  CREATININE 4.61*  --   --   --  4.17*  --   --  3.69*  TROPONINI  --   --  0.03* 0.03* 0.03*  --   --   --     Estimated Creatinine Clearance: 14.9 mL/min (A) (by C-G formula based on SCr of 3.69 mg/dL (H)).   Assessment:  Anticoag: atrial fibrillation w/ RVR, INR on admission = 2.6 (INR was 5 on 8/20) Dose PTA = 1.25 mg MWF, 2.5 mg TTSS INR 2.81 up again today. Hgb 8.1. Plts stable.  Goal of Therapy:  INR 2-3 Monitor platelets by anticoagulation protocol: Yes   Plan:  Warfarin 1 mg PO x 1 Daily INR  Alford Highland, [employer] 02/28/2017,2:30 PM

## 2017-02-28 NOTE — Progress Notes (Signed)
Heart rate between 98>118>120 Afib., O2 Sats95% on room air.Patient has no complaints Bp stable(see flow sheet).Dr. Grandville Silos updated, ordered not to give Cardizem for now.Will cont to monitor

## 2017-02-28 NOTE — Progress Notes (Signed)
R/t pt receiving rituxan chemo infusion, IV Team nurse has called stating that since she is coming in on her day off, she will not be able to stay and "titrate" this infusion, not assess for any reactions.  [facility] staff is not trained to administer chemo infusions, and therefore, has paged Dr. Grandville Silos to request pt transfer to unit that is qualified to administer this medication.  Waiting for response.

## 2017-02-28 NOTE — Progress Notes (Signed)
Subjective: Interval History: has complaints just tired of being here.  Objective: Vital signs in last 24 hours: Temp:  [97.5 F (36.4 C)-98.4 F (36.9 C)] 98.1 F (36.7 C) (08/25 0706) Pulse Rate:  [64-80] 70 (08/25 0706) Resp:  [16-18] 18 (08/24 2229) BP: (134-159)/(63-73) 159/65 (08/25 0706) SpO2:  [91 %-96 %] 95 % (08/25 0706) Weight:  [84.2 kg (185 lb 11.2 oz)] 84.2 kg (185 lb 11.2 oz) (08/25 0710) Weight change:   Intake/Output from previous day: 08/24 0701 - 08/25 0700 In: 5129 [P.O.:942; I.V.:923.7; Blood:630; IV Piggyback:2633.3] Out: 1625 [Urine:1625] Intake/Output this shift: Total I/O In: -  Out: 850 [Urine:850]  General appearance: alert, cooperative, no distress, moderately obese and pale Resp: clear to auscultation bilaterally Cardio: S1, S2 normal and systolic murmur: systolic ejection 2/6, decrescendo at 2nd left intercostal space GI: obese, pos bs, liver down 4 cm Extremities: tr edema  Lab Results:  Recent Labs  02/27/17 1940 02/28/17 0424  WBC 8.7 9.2  HGB 8.5* 8.1*  HCT 26.1* 24.3*  PLT 224 224   BMET:  Recent Labs  02/27/17 0605 02/28/17 0424  NA 139 137  K 4.7 4.3  CL 109 108  CO2 19* 21*  GLUCOSE 146* 112*  BUN 85* 79*  CREATININE 4.17* 3.69*  CALCIUM 8.5* 8.2*   No results for input(s): PTH in the last 72 hours. Iron Studies:  Recent Labs  02/27/17 1230  IRON 109  TIBC 255  FERRITIN 877*    Studies/Results: Dg Chest 2 View  Result Date: 02/26/2017 CLINICAL DATA:  Chronic renal failure. The patient has been lethargic. EXAM: CHEST  2 VIEW COMPARISON:  January 16, 2017 FINDINGS: Elevation of the right hemidiaphragm persists. The heart, hila, mediastinum, lungs, and pleura are otherwise normal. No acute interval changes. IMPRESSION: No active cardiopulmonary disease. Electronically Signed   By: Dorise Bullion III M.D   On: 02/26/2017 12:39    I have reviewed the patient's current medications. Prior to Admission:   Prescriptions Prior to Admission  Medication Sig Dispense Refill Last Dose  . amLODipine (NORVASC) 5 MG tablet Take 5 mg by mouth daily.   02/26/2017 at Unknown time  . aspirin EC 81 MG tablet Take 81 mg by mouth daily.   02/26/2017 at Unknown time  . cyclophosphamide (CYTOXAN) 25 MG capsule Take 3 capsules (75 mg total) by mouth daily. Give on an empty stomach 1 hour before or 2 hours after meals. 90 capsule 0 02/25/2017 at Unknown time  . dapsone 100 MG tablet Take 100 mg by mouth at bedtime.    Past Week at Unknown time  . dicyclomine (BENTYL) 10 MG capsule Take 1 capsule (10 mg total) by mouth 3 (three) times daily as needed for spasms. 60 capsule 0 unknown  . famotidine (PEPCID) 20 MG tablet Take 1 tablet (20 mg total) by mouth 2 (two) times daily. 60 tablet 0 02/26/2017 at Unknown time  . isosorbide mononitrate (IMDUR) 60 MG 24 hr tablet Take 60 mg by mouth at bedtime.   02/25/2017 at Unknown time  . metoprolol tartrate (LOPRESSOR) 25 MG tablet Take 1 tablet (25 mg total) by mouth 3 (three) times daily. 120 tablet 0 02/26/2017 at 0700am  . nitroGLYCERIN (NITROSTAT) 0.4 MG SL tablet Place 1 tablet (0.4 mg total) under the tongue every 5 (five) minutes x 3 doses as needed for chest pain. 25 tablet 12 unknown  . ondansetron (ZOFRAN) 4 MG tablet Take 1 tablet (4 mg total) by mouth every 8 (eight)  hours as needed for nausea or vomiting. 30 tablet 1 Past Week at Unknown time  . predniSONE (DELTASONE) 10 MG tablet Take by mouth See admin instructions. Take as directed by doctor with a taper dose   02/25/2017 at Unknown time  . sucralfate (CARAFATE) 1 GM/10ML suspension Take 1 g by mouth 2 (two) times daily as needed.   unknown  . saccharomyces boulardii (FLORASTOR) 250 MG capsule Take 1 capsule (250 mg total) by mouth 2 (two) times daily. 30 capsule 0 Taking  . warfarin (COUMADIN) 2.5 MG tablet Take 1 tablet (2.5 mg total) by mouth daily. (Patient taking differently: Take 1.25-2.5 mg by mouth See admin  instructions. Take 1.25mg  (1/2 tablet) by mouth on Monday, Wednesday, and Friday.  Take 1 tablet (2.5mg ) by mouth on Tuesday, Thursday, Saturday, and sunday) 30 tablet 1 Taking    Assessment/Plan: 1 CKD4 Willaim Bane GN for Ritux today.  Cr improved 2 HTN controlled 3 Anemia Fe ok, may need esa outpatient 4 Obesity 5 Hx afib P Ritux, can d/c after and f/u with Dr. Hollie Salk   LOS: 2 days   Doris Mcgilvery L 02/28/2017,10:23 AM

## 2017-02-28 NOTE — Progress Notes (Signed)
Notified Dr. Grandville Silos of chemo infusion completion.  Pt continues to be in a-fib with HR that jumps from 80s to 130s non-sustaining.  Pt asymptomatic.  VSS.

## 2017-02-28 NOTE — Progress Notes (Signed)
First dose of Rituxan done.Patient had no complaints during infusion.See flow sheet for frequent vitals. Cont having afib rate between 92-120's with  occasional rate of 130's.BP and O2 Sats stable. Primary RN Gabriel Cirri made aware and will update Dr. Grandville Silos.

## 2017-03-01 LAB — CBC
HCT: 25.5 % — ABNORMAL LOW (ref 36.0–46.0)
Hemoglobin: 8.2 g/dL — ABNORMAL LOW (ref 12.0–15.0)
MCH: 29.1 pg (ref 26.0–34.0)
MCHC: 32.2 g/dL (ref 30.0–36.0)
MCV: 90.4 fL (ref 78.0–100.0)
Platelets: 195 K/uL (ref 150–400)
RBC: 2.82 MIL/uL — ABNORMAL LOW (ref 3.87–5.11)
RDW: 17.3 % — ABNORMAL HIGH (ref 11.5–15.5)
WBC: 7.2 K/uL (ref 4.0–10.5)

## 2017-03-01 LAB — BASIC METABOLIC PANEL
Anion gap: 8 (ref 5–15)
BUN: 69 mg/dL — AB (ref 6–20)
CALCIUM: 7.9 mg/dL — AB (ref 8.9–10.3)
CHLORIDE: 110 mmol/L (ref 101–111)
CO2: 22 mmol/L (ref 22–32)
CREATININE: 3.53 mg/dL — AB (ref 0.44–1.00)
GFR calc non Af Amer: 12 mL/min — ABNORMAL LOW (ref >60)
GFR, EST AFRICAN AMERICAN: 14 mL/min — AB (ref >60)
Glucose, Bld: 130 mg/dL — ABNORMAL HIGH (ref 65–99)
Potassium: 4.3 mmol/L (ref 3.5–5.1)
SODIUM: 140 mmol/L (ref 135–145)

## 2017-03-01 LAB — GLUCOSE, CAPILLARY
GLUCOSE-CAPILLARY: 124 mg/dL — AB (ref 65–99)
Glucose-Capillary: 145 mg/dL — ABNORMAL HIGH (ref 65–99)
Glucose-Capillary: 221 mg/dL — ABNORMAL HIGH (ref 65–99)

## 2017-03-01 LAB — PROTIME-INR
INR: 3.77
Prothrombin Time: 38.2 s — ABNORMAL HIGH (ref 11.4–15.2)

## 2017-03-01 NOTE — Discharge Instructions (Addendum)

## 2017-03-01 NOTE — Progress Notes (Signed)
Pt refused her 1 unit insulin. IV Zofran given for nausea, have BM, pt tired and lethargic, might be the effect of post chemo, will continue to monitor

## 2017-03-01 NOTE — Progress Notes (Signed)
Pt's CBG 221, refusing insulin intake, she said she is not taking insulin since admission, she said she is not diabetic and it is only because of her taking steroid, MD aware

## 2017-03-01 NOTE — Progress Notes (Signed)
Subjective: Interval History: has complaints , chronic incontinence.  Objective: Vital signs in last 24 hours: Temp:  [97.2 F (36.2 C)-98.6 F (37 C)] 97.7 F (36.5 C) (08/26 0653) Pulse Rate:  [74-142] 75 (08/26 0653) Resp:  [16-22] 17 (08/25 1717) BP: (100-152)/(59-100) 152/76 (08/26 0653) SpO2:  [93 %-98 %] 95 % (08/26 0653) Weight:  [84.1 kg (185 lb 8 oz)] 84.1 kg (185 lb 8 oz) (08/26 0320) Weight change:   Intake/Output from previous day: 08/25 0701 - 08/26 0700 In: 2640 [P.O.:720; I.V.:1920] Out: 2001 [Urine:2000; Stool:1] Intake/Output this shift: No intake/output data recorded.  General appearance: cooperative, morbidly obese, pale and depressed Resp: cooperative, morbidly obese, pale and above Cardio: irregularly irregular rhythm and systolic murmur: holosystolic 2/6, blowing at apex GI: obese, pos bs , soft ExtremitieTredema Tr  Resp decreased bs, no R,R, W  Lab Results:  Recent Labs  02/28/17 0424 03/01/17 0519  WBC 9.2 7.2  HGB 8.1* 8.2*  HCT 24.3* 25.5*  PLT 224 195   BMET:  Recent Labs  02/28/17 0424 03/01/17 0519  NA 137 140  K 4.3 4.3  CL 108 110  CO2 21* 22  GLUCOSE 112* 130*  BUN 79* 69*  CREATININE 3.69* 3.53*  CALCIUM 8.2* 7.9*   No results for input(s): PTH in the last 72 hours. Iron Studies:  Recent Labs  02/27/17 1230  IRON 109  TIBC 255  FERRITIN 877*    Studies/Results: No results found.  I have reviewed the patient's current medications.  Assessment/Plan: 1 CKD 4/Anca Gn no prob with Ritux, Stable to better Cr 2 Anemia 3 Depression  4 Afib 5 Anticoab 6 Obesity 7 Incont chronic P cont pred taper, can d/c home from my standpoint.     LOS: 3 days   Laurence Folz L 03/01/2017,9:20 AM

## 2017-03-01 NOTE — Progress Notes (Signed)
ANTICOAGULATION CONSULT NOTE - Follow Up Consult  Pharmacy Consult for Coumadin Indication: atrial fibrillation  Allergies  Allergen Reactions  . Brilinta [Ticagrelor] Shortness Of Breath, Other (See Comments) and Cough    Reaction:  Fatigue  . Lisinopril Shortness Of Breath and Cough  . Statins Other (See Comments)    Reaction:  Muscle pain/weakness  . Zetia [Ezetimibe] Other (See Comments)    Reaction:  Muscle pain/weakness   . Macrodantin [Nitrofurantoin Macrocrystal] Rash  . Sulfa Antibiotics Rash    Patient Measurements: Height: 5\' 7"  (170.2 cm) Weight: 185 lb 8 oz (84.1 kg) IBW/kg (Calculated) : 61.6   Vital Signs: Temp: 97.7 F (36.5 C) (08/26 0653) Temp Source: Oral (08/26 0653) BP: 152/76 (08/26 0653) Pulse Rate: 75 (08/26 0653)  Labs:  Recent Labs  02/26/17 1759 02/27/17 0018 02/27/17 7939  02/27/17 1940 02/28/17 0424 03/01/17 0519  HGB  --   --  7.1*  < > 8.5* 8.1* 8.2*  HCT  --   --  22.2*  < > 26.1* 24.3* 25.5*  PLT  --   --  254  --  224 224 195  LABPROT  --   --  29.0*  --   --  30.2* 38.2*  INR  --   --  2.67  --   --  2.81 3.77  CREATININE  --   --  4.17*  --   --  3.69* 3.53*  TROPONINI 0.03* 0.03* 0.03*  --   --   --   --   < > = values in this interval not displayed.  Estimated Creatinine Clearance: 15.6 mL/min (A) (by C-G formula based on SCr of 3.53 mg/dL (H)).  Assessment:  Anticoag: atrial fibrillation w/ RVR, INR on admission = 2.6 (INR was 5 on 8/20) Dose PTA = 1.25 mg MWF, 2.5 mg TTSS INR 2.81>3.77 today. Hgb 8.2 stable. Plts 224>195.  Goal of Therapy:  INR 2-3 Monitor platelets by anticoagulation protocol: Yes   Plan:  Hold warfarin tonight Daily INR   Shlomo Seres S. Alford Highland, PharmD, BCPS Clinical Staff Pharmacist Pager [phone]  Eilene Ghazi Stillinger 03/01/2017,11:04 AM

## 2017-03-01 NOTE — Progress Notes (Signed)
PROGRESS NOTE    Jody Ruiz  MCN:[id number] DOB: 06-Oct-1940 DOA: 02/26/2017 PCP: Burnard Bunting, MD    Brief Narrative:   Jody Ruiz is a 76 y.o. female with medical history significant of obesity, MI, lung nodule, hypertension, I DP, hyperlipidemia, hypothyroidism, atrial fibrillation. Patient reports a general decline in her overall health since January with more marked decline since July as patient's renal function has declined. Patient endorses persistent nausea since starting Cytoxan for treatment of ANCA glomerulonephritis. Patient reports very little orally intubated over the last 1-2 weeks. Only over the last 1-2 days. Patient was seen and her nephrologist's office on day of admission for routine evaluation when she had a syncopal episode in her chair. Patient never lost a pulse and never stopped breathing. She was assisted from her chair to the floor. Regained consciousness after approximately 1 minute. No reports of loss of bowel or bladder function, seizure type activity, tongue biting, or post syncopal confusion or somnolence.  No recently reported dysuria, frequency, flank pain, focal neurological deficits, headache, neck stiffness, cough, chest pain, shortness of breath.    Assessment & Plan:   Active Problems:   B12 deficiency   Tachycardia-bradycardia syndrome (HCC)   Acute kidney injury superimposed on chronic kidney disease (HCC)   Glomerulonephritis   Syncope   PAF (paroxysmal atrial fibrillation) (HCC)   Lactic acidemia   Chronic diastolic CHF (congestive heart failure) (HCC)   Diabetes mellitus with complication (HCC)  #1 syncope Likely secondary to volume depletion secondary to decreased oral intake and ongoing GI losses. Patient with no further syncopal episodes. Chest x-ray negative for any acute infiltrate. Patient with no overt bleeding. Patient with no signs or symptoms of an acute infectious etiology. Patient with no focal neurological  deficits. Supportive care. Follow.  #2 anemia Likely secondary to anemia of chronic disease. Patient with no overt bleeding. Decreasing hemoglobin likely secondary to RBCs noted in urinalysis as well as dilutional effect. Patient is status post 1 unit packed red blood cells hemoglobin currently at 8.2 from 7.1. Follow H&H.  #3 RPGN/ANA vasculitis Patient unable to tolerate cyclophosphamide-based regimen. Patient has been seen in consultation by nephrology who have discussed with patient's primary nephrologist Dr. Hollie Salk s/p first infusion of rituximab by nephrology on 02/28/2017 which patient tolerated. Outpatient follow-up with Dr. Hollie Salk.  #4 hypertension Stable. Continue home regimen of Norvasc, Imdur and Lopressor.  #5 atrial fibrillation Rate elevated in the 140s during infusion on 02/28/2017. Patient was asymptomatic. Heart rate improved in less than 100. Continue home regimen of Lopressor. INR supratherapeutic at 3.77. Hold Coumadin dose tonight. Coumadin per pharmacy.  #6 nausea/vomiting Likely secondary to intolerance to cyclophosphamide-based regimen. IV fluids. Patient with no further emesis. Patient tolerating soft diet. Supportive care.  #7 diabetes mellitus CBGs have ranged from 112 -146. Patient refusing sliding scale insulin.   #9 gastroesophageal reflux disease Patient status post upper endoscopy which was unremarkable. Patient refusing PPI and H2 blocker. Discontinued PPI and H2 blocker per patient request.  #62 chronic diastolic heart failure Stable. Euvolemic. Saline lock IV fluids.  #11 abnormal urinalysis.  Patient denies any dysuria. Large amount of blood noted in urine. Negative for ketones on nitrates. Likely secondary to kidney injury. Follow.  #12 vitamin B 12 deficiency Continue vitamin B-12 IM daily.   DVT prophylaxis: SCD Code Status: Full Family Communication: Updated patient and daughter at bedside. Disposition Plan: Hopefully  tomorrow.   Consultants:   Nephrology: Dr. Graylon Gunning 02/26/2017  Procedures:  Transfused 2 unit packed red blood cells 02/27/2017  Chest x-ray 02/26/2017  Antimicrobials:   None   Subjective: Patient laying in bed. No chest pain. No shortness of breath. No dizziness. No syncopal episodes. Feeling better. Complaining of generalized weakness. States she is not ready to be discharged. Patient endorses nausea. Patient denies any further emesis. Tolerating current diet.  Objective: Vitals:   02/28/17 2050 03/01/17 0114 03/01/17 0320 03/01/17 0653  BP: 124/70 120/82  (!) 152/76  Pulse: 93 96  75  Resp:      Temp: 98.4 F (36.9 C) 98.6 F (37 C)  97.7 F (36.5 C)  TempSrc: Oral Oral  Oral  SpO2: 95% 93%  95%  Weight:   84.1 kg (185 lb 8 oz)   Height:        Intake/Output Summary (Last 24 hours) at 03/01/17 1343 Last data filed at 03/01/17 0300  Gross per 24 hour  Intake             2160 ml  Output              550 ml  Net             1610 ml   Filed Weights   02/27/17 0011 02/28/17 0710 03/01/17 0320  Weight: 84 kg (185 lb 3.2 oz) 84.2 kg (185 lb 11.2 oz) 84.1 kg (185 lb 8 oz)    Examination:  General exam: Pale.Weak. Respiratory system: Clear to auscultation anterior lung fields. No wheezes. No crackles. No rhonchi. Respiratory effort normal. Cardiovascular system: Irregularly irregular. No JVD, murmurs, rubs, gallops or clicks. No pedal edema. Gastrointestinal system: Abdomen is soft, nondistended, nontender, positive bowel sounds, no hepatosplenomegaly.  Central nervous system: Alert and oriented. No focal neurological deficits. Extremities: Symmetric 5 x 5 power. Skin: No rashes, lesions or ulcers Psychiatry: Judgement and insight appear normal. Mood & affect Flat.     Data Reviewed: I have personally reviewed following labs and imaging studies  CBC:  Recent Labs Lab 02/26/17 1220 02/27/17 0605 02/27/17 1230 02/27/17 1940 02/28/17 0424  03/01/17 0519  WBC 11.2* 9.1  --  8.7 9.2 7.2  HGB 8.7* 7.1* 7.2* 8.5* 8.1* 8.2*  HCT 27.0* 22.2* 22.5* 26.1* 24.3* 25.5*  MCV 90.0 90.6  --  88.8 87.7 90.4  PLT 269 254  --  224 224 951   Basic Metabolic Panel:  Recent Labs Lab 02/26/17 1220 02/27/17 0605 02/28/17 0424 03/01/17 0519  NA 136 139 137 140  K 5.1 4.7 4.3 4.3  CL 104 109 108 110  CO2 19* 19* 21* 22  GLUCOSE 176* 146* 112* 130*  BUN 97* 85* 79* 69*  CREATININE 4.61* 4.17* 3.69* 3.53*  CALCIUM 8.8* 8.5* 8.2* 7.9*  PHOS  --   --  4.8*  --    GFR: Estimated Creatinine Clearance: 15.6 mL/min (A) (by C-G formula based on SCr of 3.53 mg/dL (H)). Liver Function Tests:  Recent Labs Lab 02/26/17 1201 02/27/17 0605 02/28/17 0424  AST 30 31  --   ALT 37 39  --   ALKPHOS 60 50  --   BILITOT 1.8* 1.4*  --   PROT 6.0* 4.9*  --   ALBUMIN 3.2* 2.7* 2.6*    Recent Labs Lab 02/26/17 1201  LIPASE 136*   No results for input(s): AMMONIA in the last 168 hours. Coagulation Profile:  Recent Labs Lab 02/23/17 1139 02/26/17 1311 02/27/17 0605 02/28/17 0424 03/01/17 0519  INR 5.0 2.60 2.67 2.81 3.77  Cardiac Enzymes:  Recent Labs Lab 02/26/17 1201 02/26/17 1759 02/27/17 0018 02/27/17 0605  TROPONINI 0.03* 0.03* 0.03* 0.03*   BNP (last 3 results) No results for input(s): PROBNP in the last 8760 hours. HbA1C: No results for input(s): HGBA1C in the last 72 hours. CBG:  Recent Labs Lab 02/28/17 1204 02/28/17 1640 02/28/17 2048 03/01/17 0743 03/01/17 1209  GLUCAP 144* 252* 242* 124* 145*   Lipid Profile: No results for input(s): CHOL, HDL, LDLCALC, TRIG, CHOLHDL, LDLDIRECT in the last 72 hours. Thyroid Function Tests: No results for input(s): TSH, T4TOTAL, FREET4, T3FREE, THYROIDAB in the last 72 hours. Anemia Panel:  Recent Labs  02/27/17 1230  VITAMINB12 116*  FOLATE 7.0  FERRITIN 877*  TIBC 255  IRON 109  RETICCTPCT 2.9   Sepsis Labs:  Recent Labs Lab 02/26/17 1259  02/27/17 1057  LATICACIDVEN 2.83* 1.5    Recent Results (from the past 240 hour(s))  Urine culture     Status: Abnormal   Collection Time: 02/26/17  3:25 PM  Result Value Ref Range Status   Specimen Description URINE, RANDOM  Final   Special Requests NONE  Final   Culture <10,000 COLONIES/mL (A)  Final   Report Status 02/28/2017 FINAL  Final         Radiology Studies: No results found.      Scheduled Meds: . amLODipine  5 mg Oral Daily  . aspirin EC  81 mg Oral Daily  . cyanocobalamin  1,000 mcg Intramuscular Daily  . insulin aspart  0-5 Units Subcutaneous QHS  . insulin aspart  0-9 Units Subcutaneous TID WC  . isosorbide mononitrate  60 mg Oral QHS  . metoprolol tartrate  25 mg Oral TID  . predniSONE  50 mg Oral Q breakfast  . protein supplement  2 oz Oral BID WC  . sodium chloride flush  3 mL Intravenous Q12H  . Warfarin - Pharmacist Dosing Inpatient   Does not apply q1800   Continuous Infusions:    LOS: 3 days    Time spent: 34 minutes    THOMPSON,DANIEL, MD [facility] Pager [phone]  If 7PM-7AM, please contact night-coverage [email or web address] Password [email or web address] 03/01/2017, 1:43 PM

## 2017-03-02 DIAGNOSIS — I4891 Unspecified atrial fibrillation: Secondary | ICD-10-CM

## 2017-03-02 DIAGNOSIS — K219 Gastro-esophageal reflux disease without esophagitis: Secondary | ICD-10-CM

## 2017-03-02 LAB — RENAL FUNCTION PANEL
ALBUMIN: 2.7 g/dL — AB (ref 3.5–5.0)
Anion gap: 7 (ref 5–15)
BUN: 68 mg/dL — AB (ref 6–20)
CO2: 22 mmol/L (ref 22–32)
CREATININE: 3.53 mg/dL — AB (ref 0.44–1.00)
Calcium: 8.3 mg/dL — ABNORMAL LOW (ref 8.9–10.3)
Chloride: 110 mmol/L (ref 101–111)
GFR calc Af Amer: 14 mL/min — ABNORMAL LOW (ref >60)
GFR calc non Af Amer: 12 mL/min — ABNORMAL LOW (ref >60)
GLUCOSE: 140 mg/dL — AB (ref 65–99)
PHOSPHORUS: 5.1 mg/dL — AB (ref 2.5–4.6)
POTASSIUM: 4.3 mmol/L (ref 3.5–5.1)
SODIUM: 139 mmol/L (ref 135–145)

## 2017-03-02 LAB — HEMOGLOBIN AND HEMATOCRIT, BLOOD
HCT: 26.3 % — ABNORMAL LOW (ref 36.0–46.0)
Hemoglobin: 8.6 g/dL — ABNORMAL LOW (ref 12.0–15.0)

## 2017-03-02 LAB — GLUCOSE, CAPILLARY
GLUCOSE-CAPILLARY: 178 mg/dL — AB (ref 65–99)
Glucose-Capillary: 231 mg/dL — ABNORMAL HIGH (ref 65–99)
Glucose-Capillary: 114 mg/dL — ABNORMAL HIGH (ref 65–99)

## 2017-03-02 LAB — PROTIME-INR
INR: 3.12
PROTHROMBIN TIME: 32.8 s — AB (ref 11.4–15.2)

## 2017-03-02 MED ORDER — METOPROLOL TARTRATE 25 MG PO TABS: 25.0000 mg | ORAL_TABLET | Freq: Three times a day (TID) | ORAL | 0 refills | Status: DC

## 2017-03-02 MED ORDER — WARFARIN SODIUM 2.5 MG PO TABS: 1.2500 mg | ORAL_TABLET | Freq: Once | ORAL | Status: DC

## 2017-03-02 MED ORDER — WARFARIN SODIUM 2.5 MG PO TABS: 1.2500 mg | ORAL_TABLET | ORAL | 0 refills | Status: AC

## 2017-03-02 MED ORDER — UNJURY CHICKEN SOUP POWDER: 2.0000 [oz_av] | Freq: Two times a day (BID) | ORAL | 0 refills | Status: AC

## 2017-03-02 MED ORDER — VITAMIN B-12 1000 MCG PO TABS: 1000.0000 ug | ORAL_TABLET | Freq: Every day | ORAL | 0 refills | Status: AC

## 2017-03-02 NOTE — Progress Notes (Signed)
Patient ID: Jody Ruiz, female   DOB: 1940-12-09, 76 y.o.   MRN: [id number]  [facility] Progress Note   Assessment/ Plan:   1. Syncopal event:  suspected to have been a vasovagal versus hemodynamically mediated event with preceding history. Fortunately, no neurological deficits noted. 2. RPGN/ANCA vasculitis:  she was intolerant of his cyclophosphamide based regimen and has now received rituximab in conjunction with corticosteroids. Her discharge dose of prednisone will be 50 mg daily with instruction to switch to 40 mg beginning tomorrow based on previous plan to taper. I will call our office and set her up for follow-up appointment with Dr. Hollie Salk 3. Anemia: Anemia chronic disease/recent acute illness, with good iron saturation at this time and likely to undertake ESA therapy with ongoing rituximab as an outpatient. 4. Atrial fibrillation on chronic anticoagulation :  continue on Coumadin with INR monitoring. 5. Nausea/vomiting: resolved with stopping cyclophosphamide and symptomatic management.  Subjective:   Denies any active complaints and reports improvement of her nausea and vomiting. She is adamant to go home without any kind of assistance    Objective:   BP 138/67 (BP Location: Right Arm)   Pulse 82   Temp 97.8 F (36.6 C) (Oral)   Resp 18   Ht 5\' 7"  (1.702 m)   Wt 84.1 kg (185 lb 4.8 oz)   LMP  (LMP Unknown)   SpO2 96%   BMI 29.02 kg/m   Intake/Output Summary (Last 24 hours) at 03/02/17 1020 Last data filed at 03/02/17 2993  Gross per 24 hour  Intake              243 ml  Output             1300 ml  Net            -1057 ml   Weight change: -0.181 kg (-6.4 oz)  Physical Exam: Gen: Comfortably resting on the side of her bed, daughter at bedside CVS: Pulse irregularly irregular, S1 and S2 normal Resp: Diminished breath sounds over bases-no rales/rhonchi Abd: Soft, obese, nontender Ext: Trace lower extremity edema   Imaging: No results  found.  Labs: BMET  Recent Labs Lab 02/26/17 1220 02/27/17 0605 02/28/17 0424 03/01/17 0519 03/02/17 0419  NA 136 139 137 140 139  K 5.1 4.7 4.3 4.3 4.3  CL 104 109 108 110 110  CO2 19* 19* 21* 22 22  GLUCOSE 176* 146* 112* 130* 140*  BUN 97* 85* 79* 69* 68*  CREATININE 4.61* 4.17* 3.69* 3.53* 3.53*  CALCIUM 8.8* 8.5* 8.2* 7.9* 8.3*  PHOS  --   --  4.8*  --  5.1*   CBC  Recent Labs Lab 02/27/17 0605  02/27/17 1940 02/28/17 0424 03/01/17 0519 03/02/17 0419  WBC 9.1  --  8.7 9.2 7.2  --   HGB 7.1*  < > 8.5* 8.1* 8.2* 8.6*  HCT 22.2*  < > 26.1* 24.3* 25.5* 26.3*  MCV 90.6  --  88.8 87.7 90.4  --   PLT 254  --  224 224 195  --   < > = values in this interval not displayed.  Medications:    . amLODipine  5 mg Oral Daily  . aspirin EC  81 mg Oral Daily  . cyanocobalamin  1,000 mcg Intramuscular Daily  . insulin aspart  0-5 Units Subcutaneous QHS  . insulin aspart  0-9 Units Subcutaneous TID WC  . isosorbide mononitrate  60 mg Oral QHS  . metoprolol tartrate  25 mg Oral TID  . predniSONE  50 mg Oral Q breakfast  . protein supplement  2 oz Oral BID WC  . sodium chloride flush  3 mL Intravenous Q12H  . warfarin  1.25 mg Oral ONCE-1800  . Warfarin - Pharmacist Dosing Inpatient   Does not apply [id number]      Elmarie Shiley, MD 03/02/2017, 10:20 AM

## 2017-03-02 NOTE — Discharge Summary (Signed)
Physician Discharge Summary  Jody Ruiz IOE:[id number] DOB: 12-20-1940 DOA: 02/26/2017  PCP: Burnard Bunting, MD  Admit date: 02/26/2017 Discharge date: 03/02/2017  Time spent: 65 minutes  Recommendations for Outpatient Follow-up:  1. Follow-up with Dr. out to 03/12/2017 as previously scheduled. Follow-up with 2. Follow-up with Burnard Bunting, MD  in 2 weeks. On follow-up patient's vitamin B 12 levels will need to be reassessed. Patient also need a CBC done to follow-up on H&H. Patient also need a basic metabolic profile done to follow-up on electrolytes and renal function. 3. Follow-up at Coumadin clinic on Thursday, 03/05/2017 for PT/INR check and further recommendations on Coumadin dosing.   Discharge Diagnoses:  Principal Problem:   Syncope Active Problems:   Glomerulonephritis   B12 deficiency   Tachycardia-bradycardia syndrome (HCC)   Gastroesophageal reflux disease   Acute kidney injury superimposed on chronic kidney disease (HCC)   Atrial fibrillation with RVR (HCC)   PAF (paroxysmal atrial fibrillation) (HCC)   Lactic acidemia   Chronic diastolic CHF (congestive heart failure) ([place])   Diabetes mellitus with complication ([place])   Discharge Condition: Stable and improved.  Diet recommendation: Heart healthy  Filed Weights   02/28/17 0710 03/01/17 0320 03/02/17 0420  Weight: 84.2 kg (185 lb 11.2 oz) 84.1 kg (185 lb 8 oz) 84.1 kg (185 lb 4.8 oz)    History of present illness:  Per Dr Miguel Aschoff is a 76 y.o. female with medical history significant of obesity, MI, lung nodule, hypertension, I DP, hyperlipidemia, hypothyroidism, atrial fibrillation. Patient reported a general decline in her overall health since January with more marked decline since July as patient's renal function has declined. Patient endorsed persistent nausea since starting Cytoxan for treatment of ANCA glomerulonephritis. Patient reported very little orally intake over the last 1-2  weeks. Only over the last 1-2 days. Patient was seen and her nephrologist's office on day of admission for routine evaluation when she had a syncopal episode in her chair. Patient never lost a pulse and never stopped breathing. She was assisted from her chair to the floor. Regained consciousness after approximately 1 minute. No reports of loss of bowel or bladder function, seizure type activity, tongue biting, or post syncopal confusion or somnolence.  No recently reported dysuria, frequency, flank pain, focal neurological deficits, headache, neck stiffness, cough, chest pain, shortness of breath.  ED Course: 1.5 L normal saline. Zofran given in ED.  Hospital Course:  #1 syncope Likely secondary to volume depletion secondary to decreased oral intake and ongoing GI losses. Patient with no further syncopal episodes. Chest x-ray negative for any acute infiltrate. Patient with no overt bleeding. Patient with no signs or symptoms of an acute infectious etiology. Patient with no focal neurological deficits. Patient was gently hydrated with IV fluids. Patient also received a unit of packed red blood cells. Patient was euvolemic by [facility] discharge. No further syncopal episodes. Follow.  #2 anemia Likely secondary to anemia of chronic disease. Patient with no overt bleeding. Decreasing hemoglobin likely secondary to RBCs noted in urinalysis as well as dilutional effect. Patient is status post 1 unit packed red blood cells hemoglobin stabilized at 8.6 by day of discharge. Outpatient follow-up.   #3 RPGN/ANA vasculitis Patient unable to tolerate cyclophosphamide-based regimen. Patient has been seen in consultation by nephrology who have discussed with patient's primary nephrologist Dr. Hollie Salk. Cyclophosphamide was discontinued and patient started on first infusion of rituximab by nephrology on 02/28/2017 which patient tolerated. Outpatient follow-up with Dr. Hollie Salk.  #4 hypertension  Stable. Patient  maintained on home regimen of Norvasc, Imdur and Lopressor.  #5 atrial fibrillation Rate elevated in the 140s during infusion on 02/28/2017. Patient was asymptomatic. Heart rate improved to less than 100. Patient was continued on a home regimen of Lopressor. INR was supratherapeutic on 03/01/2017 and as such Coumadin dose was held. Coumadin dose was resumed on day of discharge. Outpatient follow-up at the Coumadin clinic on Thursday, 03/05/2017. INR on day of discharge was 3.12.  #6 nausea/vomiting Likely secondary to intolerance to cyclophosphamide-based regimen. Patient was gently hydrated with IV fluids which was subsequently discontinued. Patient had no further emesis. Patient was started on clear liquids and diet advanced to a soft diet which she tolerated by day of discharge.  #7 diabetes mellitus Patient's CBGs were followed during the hospitalization. Patient refused sliding scale insulin. Outpatient follow-up.   #9 gastroesophageal reflux disease Patient status post upper endoscopy which was unremarkable. Patient refused PPI and H2 blocker. Outpatient follow-up.   #16 chronic diastolic heart failure Stable. Patient remained euvolemic throughout the hospitalization. Outpatient follow-up.  #11 abnormal urinalysis.  Patient denied any dysuria. Large amount of blood noted in urine. Negative for ketones on nitrates. Likely secondary to kidney injury.   #12 vitamin B 12 deficiency Patient noted to have vitamin B-12 levels of 116. Patient was placed on vitamin B 12 1040mcg IM daily during the hospitalization and will be discharged on oral tablets a vitamin B 12 1000 MCG's daily. Outpatient follow-up with PCP.   Procedures:    Transfused 2 unit packed red blood cells 02/27/2017  Chest x-ray 02/26/2017  Consultations:  Nephrology: Dr. Graylon Gunning 02/26/2017  Discharge Exam: Vitals:   03/02/17 0933 03/02/17 1403  BP:  129/69  Pulse: 82 84  Resp:  18  Temp:  98.4 F (36.9  C)  SpO2:  96%    General: NAD Cardiovascular: RRR Respiratory: CTAB  Discharge Instructions   Discharge Instructions    Diet - low sodium heart healthy    Complete by:  As directed    Increase activity slowly    Complete by:  As directed      Current Discharge Medication List    START taking these medications   Details  protein supplement (UNJURY CHICKEN SOUP) POWD Take 7 g (2 oz total) by mouth 2 (two) times daily with a meal. Qty: 27 g, Refills: 0    vitamin B-12 (CYANOCOBALAMIN) 1000 MCG tablet Take 1 tablet (1,000 mcg total) by mouth daily. Qty: 30 tablet, Refills: 0      CONTINUE these medications which have CHANGED   Details  metoprolol tartrate (LOPRESSOR) 25 MG tablet Take 1 tablet (25 mg total) by mouth 3 (three) times daily. Qty: 90 tablet, Refills: 0    warfarin (COUMADIN) 2.5 MG tablet Take 0.5-1 tablets (1.25-2.5 mg total) by mouth See admin instructions. Take 1.25mg  (1/2 tablet) by mouth on Monday, Wednesday, and Friday.  Take 1 tablet (2.5mg ) by mouth on Tuesday, Thursday, Saturday, and sunday Qty: 10 tablet, Refills: 0      CONTINUE these medications which have NOT CHANGED   Details  amLODipine (NORVASC) 5 MG tablet Take 5 mg by mouth daily.    aspirin EC 81 MG tablet Take 81 mg by mouth daily.    dicyclomine (BENTYL) 10 MG capsule Take 1 capsule (10 mg total) by mouth 3 (three) times daily as needed for spasms. Qty: 60 capsule, Refills: 0    famotidine (PEPCID) 20 MG tablet Take 1 tablet (20 mg  total) by mouth 2 (two) times daily. Qty: 60 tablet, Refills: 0    isosorbide mononitrate (IMDUR) 60 MG 24 hr tablet Take 60 mg by mouth at bedtime.    nitroGLYCERIN (NITROSTAT) 0.4 MG SL tablet Place 1 tablet (0.4 mg total) under the tongue every 5 (five) minutes x 3 doses as needed for chest pain. Qty: 25 tablet, Refills: 12   Associated Diagnoses: Unstable angina (HCC)    ondansetron (ZOFRAN) 4 MG tablet Take 1 tablet (4 mg total) by mouth every 8  (eight) hours as needed for nausea or vomiting. Qty: 30 tablet, Refills: 1    predniSONE (DELTASONE) 10 MG tablet Take by mouth See admin instructions. Take as directed by doctor with a taper dose    sucralfate (CARAFATE) 1 GM/10ML suspension Take 1 g by mouth 2 (two) times daily as needed.    saccharomyces boulardii (FLORASTOR) 250 MG capsule Take 1 capsule (250 mg total) by mouth 2 (two) times daily. Qty: 30 capsule, Refills: 0      STOP taking these medications     cyclophosphamide (CYTOXAN) 25 MG capsule      dapsone 100 MG tablet        Allergies  Allergen Reactions  . Brilinta [Ticagrelor] Shortness Of Breath, Other (See Comments) and Cough    Reaction:  Fatigue  . Lisinopril Shortness Of Breath and Cough  . Statins Other (See Comments)    Reaction:  Muscle pain/weakness  . Zetia [Ezetimibe] Other (See Comments)    Reaction:  Muscle pain/weakness   . Macrodantin [Nitrofurantoin Macrocrystal] Rash  . Sulfa Antibiotics Rash   Follow-up Information    Madelon Lips, MD Follow up on 03/12/2017.   Specialty:  Nephrology Why:  Appointment at [facility] information: [place] [address] [phone]        Burnard Bunting, MD. Schedule an appointment as soon as possible for a visit in 2 week(s).   Specialty:  Internal Medicine Why:  Message left to call patient at home and schedule follow-up appt Contact information: [place] [address] [phone]        coumadin clinic. Schedule an appointment as soon as possible for a visit on 03/05/2017.   Why:  f/u in coumadin clinic on Thursday 03/05/2017           The results of significant diagnostics from this hospitalization (including imaging, microbiology, ancillary and laboratory) are listed below for reference.    Significant Diagnostic Studies: Dg Chest 2 View  Result Date: 02/26/2017 CLINICAL DATA:  Chronic renal failure. The patient has been lethargic. EXAM: CHEST  2  VIEW COMPARISON:  January 16, 2017 FINDINGS: Elevation of the right hemidiaphragm persists. The heart, hila, mediastinum, lungs, and pleura are otherwise normal. No acute interval changes. IMPRESSION: No active cardiopulmonary disease. Electronically Signed   By: Dorise Bullion III M.D   On: 02/26/2017 12:39    Microbiology: Recent Results (from the past 240 hour(s))  Urine culture     Status: Abnormal   Collection Time: 02/26/17  3:25 PM  Result Value Ref Range Status   Specimen Description URINE, RANDOM  Final   Special Requests NONE  Final   Culture <10,000 COLONIES/mL (A)  Final   Report Status 02/28/2017 FINAL  Final     Labs: Basic Metabolic Panel:  Recent Labs Lab 02/26/17 1220 02/27/17 0605 02/28/17 0424 03/01/17 0519 03/02/17 0419  NA 136 139 137 140 139  K 5.1 4.7 4.3 4.3 4.3  CL  104 109 108 110 110  CO2 19* 19* 21* 22 22  GLUCOSE 176* 146* 112* 130* 140*  BUN 97* 85* 79* 69* 68*  CREATININE 4.61* 4.17* 3.69* 3.53* 3.53*  CALCIUM 8.8* 8.5* 8.2* 7.9* 8.3*  PHOS  --   --  4.8*  --  5.1*   Liver Function Tests:  Recent Labs Lab 02/26/17 1201 02/27/17 0605 02/28/17 0424 03/02/17 0419  AST 30 31  --   --   ALT 37 39  --   --   ALKPHOS 60 50  --   --   BILITOT 1.8* 1.4*  --   --   PROT 6.0* 4.9*  --   --   ALBUMIN 3.2* 2.7* 2.6* 2.7*    Recent Labs Lab 02/26/17 1201  LIPASE 136*   No results for input(s): AMMONIA in the last 168 hours. CBC:  Recent Labs Lab 02/26/17 1220 02/27/17 0605 02/27/17 1230 02/27/17 1940 02/28/17 0424 03/01/17 0519 03/02/17 0419  WBC 11.2* 9.1  --  8.7 9.2 7.2  --   HGB 8.7* 7.1* 7.2* 8.5* 8.1* 8.2* 8.6*  HCT 27.0* 22.2* 22.5* 26.1* 24.3* 25.5* 26.3*  MCV 90.0 90.6  --  88.8 87.7 90.4  --   PLT 269 254  --  224 224 195  --    Cardiac Enzymes:  Recent Labs Lab 02/26/17 1201 02/26/17 1759 02/27/17 0018 02/27/17 0605  TROPONINI 0.03* 0.03* 0.03* 0.03*   BNP: BNP (last 3 results)  Recent Labs  11/05/16 0755   BNP 65.3    ProBNP (last 3 results) No results for input(s): PROBNP in the last 8760 hours.  CBG:  Recent Labs Lab 03/01/17 1209 03/01/17 1742 03/01/17 2205 03/02/17 0819 03/02/17 1127  GLUCAP 145* 221* 231* 114* 178*       Signed:  Laketa Sandoz MD.  [facility] 03/02/2017, 4:56 PM

## 2017-03-02 NOTE — Evaluation (Signed)
Physical Therapy Evaluation and D/C Patient Details Name: Jody Ruiz MRN: [id number] DOB: Nov 05, 1940 Today's Date: 03/02/2017   History of Present Illness  Pt admitted after syncopal episode at her nephrologist's office. 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 PMH: increasing renal decline, CHF, DM, MI, lung nodule, HTN, chronic anemia, afib.  Clinical Impression  Pt admitted with above diagnosis. Pt currently with functional limitations due to the deficits listed below (see PT Problem List).  Pt was able to ambulate a short distance with PT help.  Refuses to use RW although this PT feels it will help.  Also refuses [place] services.  Will sign off given that pt does not want PT here or in hospital.  Thanks.    Follow Up Recommendations No PT follow up (Pt would benefit but declines services)    Equipment Recommendations  None recommended by PT    Recommendations for Other Services       Precautions / Restrictions Precautions Precaution Comments: pt denies fall, but holds furniture Restrictions Weight Bearing Restrictions: No      Mobility  Bed Mobility Overal bed mobility: Modified Independent             General bed mobility comments: HOB up  Transfers Overall transfer level: Needs assistance Equipment used: 1 person hand held assist Transfers: Sit to/from Stand Sit to Stand: Supervision         General transfer comment: for safety  Ambulation/Gait Ambulation/Gait assistance: Min guard;Min assist Ambulation Distance (Feet): 40 Feet Assistive device: 1 person hand held assist Gait Pattern/deviations: Step-through pattern;Decreased stride length;Decreased step length - right;Decreased step length - left;Antalgic;Trunk flexed;Wide base of support;Drifts right/left   Gait velocity interpretation: Below normal speed for age/gender General Gait Details: Pt furniture walks or needs HHA.  Pt ambulates  slow and cautious.  Pt did not have a significant LOB but as she ambulates, her posture worsens and she flexes more and more relying on the HHA or furniture more.  Pt made aware that this PT rrecommends RW for incr stability and for incr endurance but pt hesistant to use a RW.  Pt states she "will be fine".  Recommend that pt use RW at home and pt understands.   Stairs            Wheelchair Mobility    Modified Rankin (Stroke Patients Only)       Balance Overall balance assessment: Needs assistance Sitting-balance support: No upper extremity supported;Feet supported Sitting balance-Leahy Scale: Good     Standing balance support: Single extremity supported;During functional activity Standing balance-Leahy Scale: Poor Standing balance comment: relies on at least one UE support for balance.               High level balance activites: Direction changes;Turns High Level Balance Comments: min assist for safety with HHA needed for turns.             Pertinent Vitals/Pain Pain Assessment: No/denies pain    Home Living Family/patient expects to be discharged to:: Private residence Living Arrangements: Spouse/significant other (husband does not drive, has COPD) Available Help at Discharge: Family;Available 24 hours/day Type of Home: House Home Access: Ramped entrance     Home Layout: Two level;Able to live on main level with bedroom/bathroom Home Equipment: Shower seat;Grab bars - tub/shower;Hand held shower head;Walker - 2 wheels;Cane - single point;Bedside commode      Prior Function Level of Independence: Independent  Comments: pt has been very weak x 1 month and mostly sleeping in a chair     Hand Dominance   Dominant Hand: Right    Extremity/Trunk Assessment   Upper Extremity Assessment Upper Extremity Assessment: Defer to OT evaluation    Lower Extremity Assessment Lower Extremity Assessment: Generalized weakness    Cervical / Trunk  Assessment Cervical / Trunk Assessment: Kyphotic  Communication   Communication: No difficulties  Cognition Arousal/Alertness: Awake/alert Behavior During Therapy: WFL for tasks assessed/performed Overall Cognitive Status: Within Functional Limits for tasks assessed                                        General Comments      Exercises General Exercises - Lower Extremity Ankle Circles/Pumps: AROM;Both;5 reps;Seated Long Arc Quad: AROM;Both;5 reps;Seated   Assessment/Plan    PT Assessment Patent does not need any further PT services  PT Problem List         PT Treatment Interventions      PT Goals (Current goals can be found in the Care Plan section)  Acute Rehab PT Goals Patient Stated Goal: to go home today PT Goal Formulation: All assessment and education complete, DC therapy    Frequency     Barriers to discharge        Co-evaluation               AM-PAC PT "6 Clicks" Daily Activity  Outcome Measure Difficulty turning over in bed (including adjusting bedclothes, sheets and blankets)?: None Difficulty moving from lying on back to sitting on the side of the bed? : None Difficulty sitting down on and standing up from a chair with arms (e.g., wheelchair, bedside commode, etc,.)?: None Help needed moving to and from a bed to chair (including a wheelchair)?: A Little Help needed walking in hospital room?: A Lot Help needed climbing 3-5 steps with a railing? : Total 6 Click Score: 18    End of Session Equipment Utilized During Treatment: Gait belt Activity Tolerance: Patient limited by fatigue Patient left: in bed;with call bell/phone within reach;with family/visitor present Nurse Communication: Mobility status PT Visit Diagnosis: Unsteadiness on feet (R26.81);Muscle weakness (generalized) (M62.81)    Time: 6644-0347 PT Time Calculation (min) (ACUTE ONLY): 17 min   Charges:   PT Evaluation $PT Eval Low Complexity: 1 Low     PT G  Codes:        Trystin Terhune,PT Acute Rehabilitation [phone] (pager)   Denice Paradise 03/02/2017, 3:38 PM

## 2017-03-02 NOTE — Evaluation (Signed)
Occupational Therapy Evaluation Patient Details Name: Jody Ruiz MRN: [id number] DOB: 1940-08-12 Today's Date: 03/02/2017    History of Present Illness Pt admitted after syncopal episode at her nephrologist's office. 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 PMH: increasing renal decline, CHF, DM, MI, lung nodule, HTN, chronic anemia, afib.   Clinical Impression   Pt is functioning at a supervision level in ADL. Has availability of daughters to assist at home as needed. Pt and daughter report she is much better than when she was admitted. No further OT needs.    Follow Up Recommendations  No OT follow up    Equipment Recommendations  None recommended by OT    Recommendations for Other Services       Precautions / Restrictions Precautions Precaution Comments: pt denies fall, but hold furniture Restrictions Weight Bearing Restrictions: No      Mobility Bed Mobility Overal bed mobility: Modified Independent             General bed mobility comments: HOB up  Transfers Overall transfer level: Needs assistance Equipment used: None Transfers: Sit to/from Stand Sit to Stand: Supervision         General transfer comment: for safety    Balance Overall balance assessment: Needs assistance   Sitting balance-Leahy Scale: Good       Standing balance-Leahy Scale: Fair                             ADL either performed or assessed with clinical judgement   ADL Overall ADL's : Needs assistance/impaired Eating/Feeding: Independent;Sitting   Grooming: Wash/dry hands;Standing;Supervision/safety   Upper Body Bathing: Set up;Sitting   Lower Body Bathing: Supervison/ safety;Sit to/from stand   Upper Body Dressing : Set up;Sitting   Lower Body Dressing: Supervision/safety;Sit to/from stand   Toilet Transfer: Supervision/safety;Ambulation;Regular Toilet   Toileting- Marine scientist and Hygiene: Supervision/safety;Sit to/from stand       Functional mobility during ADLs: Supervision/safety (reaches for furniture)       Vision Patient Visual Report: No change from baseline       Perception     Praxis      Pertinent Vitals/Pain Pain Assessment: No/denies pain     Hand Dominance Right   Extremity/Trunk Assessment Upper Extremity Assessment Upper Extremity Assessment: Overall WFL for tasks assessed   Lower Extremity Assessment Lower Extremity Assessment: Defer to PT evaluation       Communication Communication Communication: No difficulties   Cognition Arousal/Alertness: Awake/alert Behavior During Therapy: WFL for tasks assessed/performed Overall Cognitive Status: Within Functional Limits for tasks assessed                                     General Comments       Exercises     Shoulder Instructions      Home Living Family/patient expects to be discharged to:: Private residence Living Arrangements: Spouse/significant other (husband does not drive, has COPD) Available Help at Discharge: Family;Available 24 hours/day Type of Home: House Home Access: Ramped entrance     Home Layout: Two level;Able to live on main level with bedroom/bathroom     Bathroom Shower/Tub: Occupational psychologist: Handicapped height     Home Equipment: Shower seat;Grab bars - tub/shower;Hand held Tourist information centre manager - 2 wheels;Cane - single point;Bedside commode  Prior Functioning/Environment Level of Independence: Independent        Comments: pt has been very weak x 1 month and mostly sleeping in a chair        OT Problem List: Impaired balance (sitting and/or standing)      OT Treatment/Interventions:      OT Goals(Current goals can be found in the care plan section) Acute Rehab OT Goals Patient Stated Goal: to go home today  OT Frequency:     Barriers to D/C:            Co-evaluation               AM-PAC PT "6 Clicks" Daily Activity     Outcome Measure Help from another person eating meals?: None Help from another person taking care of personal grooming?: A Little Help from another person toileting, which includes using toliet, bedpan, or urinal?: A Little Help from another person bathing (including washing, rinsing, drying)?: A Little Help from another person to put on and taking off regular upper body clothing?: None Help from another person to put on and taking off regular lower body clothing?: A Little 6 Click Score: 20   End of Session    Activity Tolerance: Patient tolerated treatment well Patient left: in bed;with call bell/phone within reach;with family/visitor present  OT Visit Diagnosis: Unsteadiness on feet (R26.81)                Time: 2671-2458 OT Time Calculation (min): 28 min Charges:  OT General Charges $OT Visit: 1 Visit OT Evaluation $OT Eval Low Complexity: 1 Low OT Treatments $Self Care/Home Management : 8-22 mins G-Codes:     03-20-17 Nestor Lewandowsky, OTR/L Pager: [place], Haze Boyden 20-Mar-2017, 12:44 PM

## 2017-03-02 NOTE — Progress Notes (Signed)
ANTICOAGULATION CONSULT NOTE - Follow Up Consult  Pharmacy Consult for Coumadin Indication: atrial fibrillation  Allergies  Allergen Reactions  . Brilinta [Ticagrelor] Shortness Of Breath, Other (See Comments) and Cough    Reaction:  Fatigue  . Lisinopril Shortness Of Breath and Cough  . Statins Other (See Comments)    Reaction:  Muscle pain/weakness  . Zetia [Ezetimibe] Other (See Comments)    Reaction:  Muscle pain/weakness   . Macrodantin [Nitrofurantoin Macrocrystal] Rash  . Sulfa Antibiotics Rash    Patient Measurements: Height: 5\' 7"  (170.2 cm) Weight: 185 lb 4.8 oz (84.1 kg) IBW/kg (Calculated) : 61.6   Vital Signs: Temp: 98.3 F (36.8 C) (08/27 0420) Temp Source: Oral (08/27 0420) BP: 144/72 (08/27 0420) Pulse Rate: 74 (08/27 0420)  Labs:  Recent Labs  02/27/17 1940 02/28/17 0424 03/01/17 0519 03/02/17 0419  HGB 8.5* 8.1* 8.2* 8.6*  HCT 26.1* 24.3* 25.5* 26.3*  PLT 224 224 195  --   LABPROT  --  30.2* 38.2* 32.8*  INR  --  2.81 3.77 3.12  CREATININE  --  3.69* 3.53* 3.53*    Estimated Creatinine Clearance: 15.6 mL/min (A) (by C-G formula based on SCr of 3.53 mg/dL (H)).  Assessment:  Anticoag: atrial fibrillation w/ RVR, INR on admission = 2.6 (INR was 5 on 8/20) Dose PTA = 1.25 mg MWF, 2.5 mg TTSS INR 2.81>3.77 >3.12 today. Hgb stable.   Goal of Therapy:  INR 2-3 Monitor platelets by anticoagulation protocol: Yes   Plan:  Warfarin 1.25 mg x 1 tonight. Daily INR.  Uvaldo Rising, BCPS  Clinical Pharmacist Pager [phone]  03/02/2017 8:18 AM

## 2017-03-04 ENCOUNTER — Emergency Department (HOSPITAL_COMMUNITY): Payer: Medicare Other

## 2017-03-04 ENCOUNTER — Encounter (HOSPITAL_COMMUNITY): Payer: Self-pay

## 2017-03-04 ENCOUNTER — Inpatient Hospital Stay (HOSPITAL_COMMUNITY)
Admission: EM | Admit: 2017-03-04 | Discharge: 2017-03-13 | DRG: 308 | Disposition: A | Discharge status: Skilled Nursing Facility | Payer: Medicare Other | Attending: Internal Medicine | Admitting: Internal Medicine

## 2017-03-04 DIAGNOSIS — K5900 Constipation, unspecified: Secondary | ICD-10-CM | POA: Diagnosis not present

## 2017-03-04 DIAGNOSIS — E860 Dehydration: Secondary | ICD-10-CM | POA: Diagnosis not present

## 2017-03-04 DIAGNOSIS — Z7952 Long term (current) use of systemic steroids: Secondary | ICD-10-CM | POA: Diagnosis not present

## 2017-03-04 DIAGNOSIS — E118 Type 2 diabetes mellitus with unspecified complications: Secondary | ICD-10-CM | POA: Diagnosis not present

## 2017-03-04 DIAGNOSIS — R531 Weakness: Secondary | ICD-10-CM | POA: Diagnosis not present

## 2017-03-04 DIAGNOSIS — F4323 Adjustment disorder with mixed anxiety and depressed mood: Secondary | ICD-10-CM | POA: Diagnosis not present

## 2017-03-04 DIAGNOSIS — Z794 Long term (current) use of insulin: Secondary | ICD-10-CM

## 2017-03-04 DIAGNOSIS — Z803 Family history of malignant neoplasm of breast: Secondary | ICD-10-CM | POA: Diagnosis not present

## 2017-03-04 DIAGNOSIS — Z800 Family history of malignant neoplasm of digestive organs: Secondary | ICD-10-CM

## 2017-03-04 DIAGNOSIS — E1122 Type 2 diabetes mellitus with diabetic chronic kidney disease: Secondary | ICD-10-CM | POA: Diagnosis present

## 2017-03-04 DIAGNOSIS — E785 Hyperlipidemia, unspecified: Secondary | ICD-10-CM | POA: Diagnosis present

## 2017-03-04 DIAGNOSIS — N170 Acute kidney failure with tubular necrosis: Secondary | ICD-10-CM | POA: Diagnosis not present

## 2017-03-04 DIAGNOSIS — N390 Urinary tract infection, site not specified: Secondary | ICD-10-CM | POA: Diagnosis present

## 2017-03-04 DIAGNOSIS — E43 Unspecified severe protein-calorie malnutrition: Secondary | ICD-10-CM | POA: Diagnosis not present

## 2017-03-04 DIAGNOSIS — I5032 Chronic diastolic (congestive) heart failure: Secondary | ICD-10-CM | POA: Diagnosis not present

## 2017-03-04 DIAGNOSIS — Z7901 Long term (current) use of anticoagulants: Secondary | ICD-10-CM | POA: Diagnosis not present

## 2017-03-04 DIAGNOSIS — J84112 Idiopathic pulmonary fibrosis: Secondary | ICD-10-CM | POA: Diagnosis present

## 2017-03-04 DIAGNOSIS — N183 Chronic kidney disease, stage 3 unspecified: Secondary | ICD-10-CM | POA: Diagnosis present

## 2017-03-04 DIAGNOSIS — N179 Acute kidney failure, unspecified: Secondary | ICD-10-CM | POA: Diagnosis present

## 2017-03-04 DIAGNOSIS — E039 Hypothyroidism, unspecified: Secondary | ICD-10-CM | POA: Diagnosis present

## 2017-03-04 DIAGNOSIS — Z955 Presence of coronary angioplasty implant and graft: Secondary | ICD-10-CM

## 2017-03-04 DIAGNOSIS — N184 Chronic kidney disease, stage 4 (severe): Secondary | ICD-10-CM | POA: Diagnosis present

## 2017-03-04 DIAGNOSIS — N017 Rapidly progressive nephritic syndrome with diffuse crescentic glomerulonephritis: Secondary | ICD-10-CM | POA: Diagnosis not present

## 2017-03-04 DIAGNOSIS — D518 Other vitamin B12 deficiency anemias: Secondary | ICD-10-CM | POA: Diagnosis not present

## 2017-03-04 DIAGNOSIS — R262 Difficulty in walking, not elsewhere classified: Secondary | ICD-10-CM | POA: Diagnosis not present

## 2017-03-04 DIAGNOSIS — I248 Other forms of acute ischemic heart disease: Secondary | ICD-10-CM | POA: Diagnosis present

## 2017-03-04 DIAGNOSIS — Z8249 Family history of ischemic heart disease and other diseases of the circulatory system: Secondary | ICD-10-CM

## 2017-03-04 DIAGNOSIS — D631 Anemia in chronic kidney disease: Secondary | ICD-10-CM | POA: Diagnosis present

## 2017-03-04 DIAGNOSIS — D519 Vitamin B12 deficiency anemia, unspecified: Secondary | ICD-10-CM | POA: Diagnosis present

## 2017-03-04 DIAGNOSIS — G4733 Obstructive sleep apnea (adult) (pediatric): Secondary | ICD-10-CM | POA: Diagnosis present

## 2017-03-04 DIAGNOSIS — Z7982 Long term (current) use of aspirin: Secondary | ICD-10-CM

## 2017-03-04 DIAGNOSIS — Z9181 History of falling: Secondary | ICD-10-CM | POA: Diagnosis not present

## 2017-03-04 DIAGNOSIS — J849 Interstitial pulmonary disease, unspecified: Secondary | ICD-10-CM | POA: Diagnosis not present

## 2017-03-04 DIAGNOSIS — I4891 Unspecified atrial fibrillation: Secondary | ICD-10-CM | POA: Insufficient documentation

## 2017-03-04 DIAGNOSIS — F0631 Mood disorder due to known physiological condition with depressive features: Secondary | ICD-10-CM | POA: Diagnosis present

## 2017-03-04 DIAGNOSIS — R45851 Suicidal ideations: Secondary | ICD-10-CM | POA: Diagnosis not present

## 2017-03-04 DIAGNOSIS — R109 Unspecified abdominal pain: Secondary | ICD-10-CM | POA: Diagnosis not present

## 2017-03-04 DIAGNOSIS — I495 Sick sinus syndrome: Secondary | ICD-10-CM | POA: Diagnosis present

## 2017-03-04 DIAGNOSIS — I482 Chronic atrial fibrillation: Secondary | ICD-10-CM | POA: Diagnosis not present

## 2017-03-04 DIAGNOSIS — I130 Hypertensive heart and chronic kidney disease with heart failure and stage 1 through stage 4 chronic kidney disease, or unspecified chronic kidney disease: Secondary | ICD-10-CM | POA: Diagnosis present

## 2017-03-04 DIAGNOSIS — I10 Essential (primary) hypertension: Secondary | ICD-10-CM | POA: Diagnosis not present

## 2017-03-04 DIAGNOSIS — I252 Old myocardial infarction: Secondary | ICD-10-CM | POA: Diagnosis not present

## 2017-03-04 DIAGNOSIS — Z9989 Dependence on other enabling machines and devices: Secondary | ICD-10-CM | POA: Diagnosis not present

## 2017-03-04 DIAGNOSIS — N059 Unspecified nephritic syndrome with unspecified morphologic changes: Secondary | ICD-10-CM | POA: Diagnosis not present

## 2017-03-04 DIAGNOSIS — I480 Paroxysmal atrial fibrillation: Principal | ICD-10-CM | POA: Diagnosis present

## 2017-03-04 DIAGNOSIS — G4700 Insomnia, unspecified: Secondary | ICD-10-CM | POA: Diagnosis not present

## 2017-03-04 DIAGNOSIS — I959 Hypotension, unspecified: Secondary | ICD-10-CM | POA: Diagnosis not present

## 2017-03-04 DIAGNOSIS — I2510 Atherosclerotic heart disease of native coronary artery without angina pectoris: Secondary | ICD-10-CM | POA: Diagnosis present

## 2017-03-04 DIAGNOSIS — I776 Arteritis, unspecified: Secondary | ICD-10-CM | POA: Diagnosis present

## 2017-03-04 DIAGNOSIS — Z9049 Acquired absence of other specified parts of digestive tract: Secondary | ICD-10-CM | POA: Diagnosis not present

## 2017-03-04 DIAGNOSIS — R55 Syncope and collapse: Secondary | ICD-10-CM | POA: Diagnosis not present

## 2017-03-04 DIAGNOSIS — Z6828 Body mass index (BMI) 28.0-28.9, adult: Secondary | ICD-10-CM

## 2017-03-04 DIAGNOSIS — K219 Gastro-esophageal reflux disease without esophagitis: Secondary | ICD-10-CM | POA: Diagnosis present

## 2017-03-04 DIAGNOSIS — Z66 Do not resuscitate: Secondary | ICD-10-CM | POA: Diagnosis present

## 2017-03-04 DIAGNOSIS — M6281 Muscle weakness (generalized): Secondary | ICD-10-CM | POA: Diagnosis not present

## 2017-03-04 DIAGNOSIS — R000 Tachycardia, unspecified: Secondary | ICD-10-CM | POA: Diagnosis not present

## 2017-03-04 DIAGNOSIS — I5040 Unspecified combined systolic (congestive) and diastolic (congestive) heart failure: Secondary | ICD-10-CM | POA: Diagnosis not present

## 2017-03-04 DIAGNOSIS — F0630 Mood disorder due to known physiological condition, unspecified: Secondary | ICD-10-CM | POA: Diagnosis not present

## 2017-03-04 DIAGNOSIS — R112 Nausea with vomiting, unspecified: Secondary | ICD-10-CM | POA: Diagnosis not present

## 2017-03-04 DIAGNOSIS — I129 Hypertensive chronic kidney disease with stage 1 through stage 4 chronic kidney disease, or unspecified chronic kidney disease: Secondary | ICD-10-CM | POA: Diagnosis not present

## 2017-03-04 DIAGNOSIS — R404 Transient alteration of awareness: Secondary | ICD-10-CM | POA: Diagnosis not present

## 2017-03-04 LAB — COMPREHENSIVE METABOLIC PANEL
ALK PHOS: 62 U/L (ref 38–126)
ALT: 54 U/L (ref 14–54)
ANION GAP: 12 (ref 5–15)
AST: 27 U/L (ref 15–41)
Albumin: 3 g/dL — ABNORMAL LOW (ref 3.5–5.0)
BILIRUBIN TOTAL: 1.3 mg/dL — AB (ref 0.3–1.2)
BUN: 72 mg/dL — ABNORMAL HIGH (ref 6–20)
CALCIUM: 8.6 mg/dL — AB (ref 8.9–10.3)
CO2: 20 mmol/L — ABNORMAL LOW (ref 22–32)
Chloride: 107 mmol/L (ref 101–111)
Creatinine, Ser: 3.5 mg/dL — ABNORMAL HIGH (ref 0.44–1.00)
GFR, EST AFRICAN AMERICAN: 14 mL/min — AB (ref >60)
GFR, EST NON AFRICAN AMERICAN: 12 mL/min — AB (ref >60)
GLUCOSE: 133 mg/dL — AB (ref 65–99)
POTASSIUM: 3.9 mmol/L (ref 3.5–5.1)
Sodium: 139 mmol/L (ref 135–145)
TOTAL PROTEIN: 5.5 g/dL — AB (ref 6.5–8.1)

## 2017-03-04 LAB — I-STAT CHEM 8, ED
BUN: 69 mg/dL — AB (ref 6–20)
CHLORIDE: 107 mmol/L (ref 101–111)
Calcium, Ion: 1.07 mmol/L — ABNORMAL LOW (ref 1.15–1.40)
Creatinine, Ser: 3.5 mg/dL — ABNORMAL HIGH (ref 0.44–1.00)
Glucose, Bld: 129 mg/dL — ABNORMAL HIGH (ref 65–99)
HEMATOCRIT: 25 % — AB (ref 36.0–46.0)
Hemoglobin: 8.5 g/dL — ABNORMAL LOW (ref 12.0–15.0)
Potassium: 3.9 mmol/L (ref 3.5–5.1)
Sodium: 140 mmol/L (ref 135–145)
TCO2: 22 mmol/L (ref 22–32)

## 2017-03-04 LAB — CBC WITH DIFFERENTIAL/PLATELET
BAND NEUTROPHILS: 0 %
BLASTS: 0 %
Basophils Absolute: 0 10*3/uL (ref 0.0–0.1)
Basophils Relative: 0 %
EOS ABS: 0 10*3/uL (ref 0.0–0.7)
Eosinophils Relative: 0 %
HEMATOCRIT: 25 % — AB (ref 36.0–46.0)
HEMOGLOBIN: 8.9 g/dL — AB (ref 12.0–15.0)
LYMPHS PCT: 8 %
Lymphs Abs: 0.8 10*3/uL (ref 0.7–4.0)
MCH: 33.2 pg (ref 26.0–34.0)
MCHC: 35.6 g/dL (ref 30.0–36.0)
MCV: 93.3 fL (ref 78.0–100.0)
MONOS PCT: 0 %
Metamyelocytes Relative: 1 %
Monocytes Absolute: 0 10*3/uL — ABNORMAL LOW (ref 0.1–1.0)
Myelocytes: 2 %
NEUTROS ABS: 9.4 10*3/uL — AB (ref 1.7–7.7)
NEUTROS PCT: 89 %
NRBC: 0 /100{WBCs}
OTHER: 0 %
PROMYELOCYTES ABS: 0 %
Platelets: 250 10*3/uL (ref 150–400)
RBC: 2.68 MIL/uL — AB (ref 3.87–5.11)
RDW: 17.6 % — AB (ref 11.5–15.5)
WBC: 10.2 10*3/uL (ref 4.0–10.5)

## 2017-03-04 LAB — ABO/RH: ABO/RH(D): B POS

## 2017-03-04 LAB — PROTIME-INR
INR: 2.25
Prothrombin Time: 24.7 seconds — ABNORMAL HIGH (ref 11.4–15.2)

## 2017-03-04 LAB — TYPE AND SCREEN
ABO/RH(D): B POS
ANTIBODY SCREEN: NEGATIVE

## 2017-03-04 LAB — I-STAT TROPONIN, ED: Troponin i, poc: 0.06 ng/mL (ref 0.00–0.08)

## 2017-03-04 LAB — I-STAT CG4 LACTIC ACID, ED: Lactic Acid, Venous: 2.32 mmol/L (ref 0.5–1.9)

## 2017-03-04 MED ORDER — METOPROLOL TARTRATE 25 MG PO TABS
25.0000 mg | ORAL_TABLET | Freq: Once | ORAL | Status: AC
  Administered 2017-03-04: 25 mg via ORAL
  Filled 2017-03-04: qty 1

## 2017-03-04 MED ORDER — METHYLPREDNISOLONE SODIUM SUCC 125 MG IJ SOLR
125.0000 mg | Freq: Once | INTRAMUSCULAR | Status: AC
  Administered 2017-03-04: 125 mg via INTRAVENOUS
  Filled 2017-03-04: qty 2

## 2017-03-04 MED ORDER — METOPROLOL TARTRATE 5 MG/5ML IV SOLN
2.5000 mg | Freq: Once | INTRAVENOUS | Status: AC
  Administered 2017-03-04: 2.5 mg via INTRAVENOUS
  Filled 2017-03-04: qty 5

## 2017-03-04 MED ORDER — DILTIAZEM LOAD VIA INFUSION
15.0000 mg | Freq: Once | INTRAVENOUS | Status: AC
  Administered 2017-03-04: 15 mg via INTRAVENOUS
  Filled 2017-03-04: qty 15

## 2017-03-04 MED ORDER — SODIUM CHLORIDE 0.9 % IV BOLUS (SEPSIS)
500.0000 mL | Freq: Once | INTRAVENOUS | Status: AC
  Administered 2017-03-04: 500 mL via INTRAVENOUS

## 2017-03-04 MED ORDER — WARFARIN - PHARMACIST DOSING INPATIENT
Freq: Every day | Status: DC
  Administered 2017-03-07: 18:00:00

## 2017-03-04 MED ORDER — ONDANSETRON HCL 4 MG/2ML IJ SOLN
4.0000 mg | Freq: Once | INTRAMUSCULAR | Status: AC
  Administered 2017-03-04: 4 mg via INTRAVENOUS
  Filled 2017-03-04: qty 2

## 2017-03-04 MED ORDER — DILTIAZEM HCL-DEXTROSE 100-5 MG/100ML-% IV SOLN (PREMIX)
5.0000 mg/h | INTRAVENOUS | Status: DC
  Administered 2017-03-04 – 2017-03-05 (×2): 5 mg/h via INTRAVENOUS
  Filled 2017-03-04 (×2): qty 100

## 2017-03-04 MED ORDER — WARFARIN 1.25 MG HALF TABLET
1.2500 mg | ORAL_TABLET | Freq: Once | ORAL | Status: AC
  Administered 2017-03-04: 1.25 mg via ORAL
  Filled 2017-03-04: qty 1

## 2017-03-04 NOTE — ED Notes (Signed)
PATIENT NOT ABLE TO STAND FOR ORTHOSTSTICS

## 2017-03-04 NOTE — Progress Notes (Addendum)
ANTICOAGULATION CONSULT NOTE - Initial Consult  Pharmacy Consult for warfarin Indication: atrial fibrillation  Allergies  Allergen Reactions  . Brilinta [Ticagrelor] Shortness Of Breath, Other (See Comments) and Cough    Reaction:  Fatigue  . Lisinopril Shortness Of Breath and Cough  . Statins Other (See Comments)    Reaction:  Muscle pain/weakness  . Zetia [Ezetimibe] Other (See Comments)    Reaction:  Muscle pain/weakness   . Macrodantin [Nitrofurantoin Macrocrystal] Rash  . Sulfa Antibiotics Rash    Patient Measurements:   Heparin Dosing Weight:   Vital Signs: BP: 122/70 (08/29 1504) Pulse Rate: 85 (08/29 1504)  Labs:  Recent Labs  03/02/17 0419 03/04/17 1313 03/04/17 1325  HGB 8.6* 8.9* 8.5*  HCT 26.3* 25.0* 25.0*  PLT  --  250  --   LABPROT 32.8* 24.7*  --   INR 3.12 2.25  --   CREATININE 3.53* 3.50* 3.50*    Estimated Creatinine Clearance: 15.7 mL/min (A) (by C-G formula based on SCr of 3.5 mg/dL (H)).   Medical History: Past Medical History:  Diagnosis Date  . Arthritis   . Atrial fibrillation ([place])   . Borderline hypothyroidism   . CAD (coronary artery disease)    a. 04/2012 NSTEMI/Cath:  pLAD 90%, mLAD 70-80% (long) (3x16 & 2.5x28 Promus DES to p/m LAD), pD1 70-80%, pRI 30% (small), CFX 20%, OM1 40%, mOM2 30%, pRCA 30%, PDA 30-40%, pPLB 70%, then mid 90%, EF 65%;  b. 03/2013 Abnl CL w/apical isch;  c. 04/2013 Cath: LM nl, LAD patent stents, LCX <20, RCA 30p PDA 40-50 EF 55-60% ->Cont Med Rx.  c. CP s/p LHC with stable dz and patent stents--> Rx medically   . Cataract   . Dyspnea    a. CP and SOB 06/2012 => Ticagrelor changed to Plavix  . History of Clostridium difficile infection   . HLD (hyperlipidemia)    intolerant to statins  . Hx of adenomatous colonic polyps   . Hx of echocardiogram    a. Echo 2/14:  mild LVH, EF 55-65%, Gr 1 diast dysfn, mild LAE  . Hypertension   . Idiopathic pulmonary fibrosis   . Internal hemorrhoids   . Lung nodules    . Microscopic colitis   . Myocardial infarction ([place]) 2013  . Obesity   . OSA (obstructive sleep apnea)    a. on CPAP  . Sleep apnea    cpap    Assessment: 24 YOF presents with weakness d/t atrial fibrillation with RVR.  History of atrial fibrillation on warfarin therapy.  INR therapeutic at time of admission. INR noted to be supratherapeutic on 8/20 at warfarin clinic (held warfarin x 2 days and resumed at regimen below)  Home warfarin: 1.25mg  MWF and 2.5mg  TTSS (last dose 8/28)  Today, 03/04/2017  INR = 2.25  CBC: Hgb decreased but consistent with previous values, pltc WNL  No major drug interactions. On ASA 81mg  for CAD w/ stents  Goal of Therapy:  INR 2-3   Plan:   Warfarin 1.25mg  po x 1 tonight per home regimen  Monitor INR trend closely d/t acute illness and recent supratherapeutic INR  Daily INR  Doreene Eland, PharmD, BCPS.   Pager: [phone] 03/04/2017 5:31 PM

## 2017-03-04 NOTE — H&P (Signed)
History and Physical    Jody Ruiz:[id number] DOB: 03-28-1941 DOA: 03/04/2017  PCP: Burnard Bunting, MD  Patient coming from: HOME  I have personally briefly reviewed patient's old medical records in [place]  Chief Complaint: Generalized weakness  HPI: Jody Ruiz is a 76 y.o. 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 She relates she has progressively been getting worst initially she took a medication before going to bed the day prior to admission woke up this morning fell nauseated was not able to take her medication was sitting in the commode and fell to the floor. She denies having her head her daughter is at bedside confirming most of the history. She denies a fever chills, vomiting, chest pain, shortness of breath or diarrhea.   ED Course:  In the ED she was found to be in A. fib with RVR 07/27/1938, and labs unchanged from previous admission. Chest x-ray as below. Review of Systems: As per HPI otherwise 10 point review of systems negative.    Past Medical History:  Diagnosis Date  . Arthritis   . Atrial fibrillation ([place])   . Borderline hypothyroidism   . CAD (coronary artery disease)    a. 04/2012 NSTEMI/Cath:  pLAD 90%, mLAD 70-80% (long) (3x16 & 2.5x28 Promus DES to p/m LAD), pD1 70-80%, pRI 30% (small), CFX 20%, OM1 40%, mOM2 30%, pRCA 30%, PDA 30-40%, pPLB 70%, then mid 90%, EF 65%;  b. 03/2013 Abnl CL w/apical isch;  c. 04/2013 Cath: LM nl, LAD patent stents, LCX <20, RCA 30p PDA 40-50 EF 55-60% ->Cont Med Rx.  c. CP s/p LHC with stable dz and patent stents--> Rx medically   . Cataract   . Dyspnea    a. CP  and SOB 06/2012 => Ticagrelor changed to Plavix  . History of Clostridium difficile infection   . HLD (hyperlipidemia)    intolerant to statins  . Hx of adenomatous colonic polyps   . Hx of echocardiogram    a. Echo 2/14:  mild LVH, EF 55-65%, Gr 1 diast dysfn, mild LAE  . Hypertension   . Idiopathic pulmonary fibrosis   . Internal hemorrhoids   . Lung nodules   . Microscopic colitis   . Myocardial infarction ([place]) 2013  . Obesity   . OSA (obstructive sleep apnea)    a. on CPAP  . Sleep apnea    cpap    Past Surgical History:  Procedure Laterality Date  . APPENDECTOMY  1994  . BREAST BIOPSY  1965, 1975   bilateral  . CARDIAC CATHETERIZATION     x4  . CHOLECYSTECTOMY  2004  . CORONARY STENT PLACEMENT  04/2012   x2  . KNEE ARTHROSCOPY  2000   bilateral  . LEFT AND RIGHT HEART CATHETERIZATION WITH CORONARY ANGIOGRAM N/A 08/02/2014   Procedure: LEFT AND RIGHT HEART CATHETERIZATION WITH CORONARY ANGIOGRAM;  Surgeon: Burnell Blanks, MD;  Location: [facility] CATH LAB;  Service: Cardiovascular;  Laterality: N/A;  . LEFT HEART CATHETERIZATION WITH CORONARY ANGIOGRAM N/A 04/29/2012   Procedure: LEFT HEART CATHETERIZATION WITH CORONARY ANGIOGRAM;  Surgeon: Peter M Martinique, MD;  Location: [facility] CATH LAB;  Service: Cardiovascular;  Laterality: N/A;  . LEFT HEART CATHETERIZATION WITH  CORONARY ANGIOGRAM N/A 08/03/2012   Procedure: LEFT HEART CATHETERIZATION WITH CORONARY ANGIOGRAM;  Surgeon: Burnell Blanks, MD;  Location: [facility] CATH LAB;  Service: Cardiovascular;  Laterality: N/A;  . LEFT HEART CATHETERIZATION WITH CORONARY ANGIOGRAM N/A 04/12/2013   Procedure: LEFT HEART CATHETERIZATION WITH CORONARY ANGIOGRAM;  Surgeon: Peter M Martinique, MD;  Location: [facility] CATH LAB;  Service: Cardiovascular;  Laterality: N/A;  . PERCUTANEOUS CORONARY STENT INTERVENTION (PCI-S) N/A 04/29/2012   Procedure: PERCUTANEOUS CORONARY STENT INTERVENTION (PCI-S);  Surgeon: Peter M Martinique, MD;  Location[facility] CATH LAB;   Service: Cardiovascular;  Laterality: N/A;  . SHOULDER ARTHROSCOPY  2012   left  . vaginal polypectomy  2004  . VENTRAL HERNIA REPAIR  2005     reports that she has never smoked. She has never used smokeless tobacco. She reports that she does not drink alcohol or use drugs.  Allergies  Allergen Reactions  . Brilinta [Ticagrelor] Shortness Of Breath, Other (See Comments) and Cough    Reaction:  Fatigue  . Lisinopril Shortness Of Breath and Cough  . Statins Other (See Comments)    Reaction:  Muscle pain/weakness  . Zetia [Ezetimibe] Other (See Comments)    Reaction:  Muscle pain/weakness   . Macrodantin [Nitrofurantoin Macrocrystal] Rash  . Sulfa Antibiotics Rash    Family History  Problem Relation Age of Onset  . Breast cancer Sister   . Prostate cancer Father   . Colon polyps Father   . Heart disease Father 63       CAD, died of ESRD  . Kidney disease Father   . Colon cancer Sister 72  . Heart disease Mother 20       Died acute MI  . Heart disease Brother 47       CABG  . Esophageal cancer Neg Hx   . Rectal cancer Neg Hx   . Stomach cancer Neg Hx    Unacceptable: Noncontributory, unremarkable, or negative. Acceptable: Family history reviewed and not pertinent (If you reviewed it)  Prior to Admission medications   Medication Sig Start Date End Date Taking? Authorizing Provider  amLODipine (NORVASC) 5 MG tablet Take 5 mg by mouth daily.   Yes [provider]  aspirin EC 81 MG tablet Take 81 mg by mouth daily.   Yes [provider]  isosorbide mononitrate (IMDUR) 60 MG 24 hr tablet Take 60 mg by mouth at bedtime.   Yes [provider]  metoprolol tartrate (LOPRESSOR) 25 MG tablet Take 1 tablet (25 mg total) by mouth 3 (three) times daily. 03/02/17  Yes Eugenie Filler, MD  nitroGLYCERIN (NITROSTAT) 0.4 MG SL tablet Place 1 tablet (0.4 mg total) under the tongue every 5 (five) minutes x 3 doses as needed for chest pain. 08/03/14  Yes Eileen Stanford, PA-C  ondansetron (ZOFRAN) 4 MG tablet Take 1 tablet (4 mg total) by mouth every 8 (eight) hours as needed for nausea or vomiting. 01/08/17  Yes Irene Shipper, MD  polyvinyl alcohol (LIQUIFILM TEARS) 1.4 % ophthalmic solution Place 1 drop into both eyes 3 (three) times daily as needed for dry eyes.   Yes [provider]  predniSONE (DELTASONE) 10 MG tablet Take 5-50 mg by mouth daily. 50mg  daily for 2 weeks, 40mg  for 2 weeks, 30mg  for 2 weeks, 20mg  for 2 weeks, 10mg  for 2 weeks, 5 mg for 2 weeks, then 5mg  everyother day for 2 weeks. Then stop. 02/13/17. [place] to confirm taper instructions. 02/13/17  Yes [provider]  RiTUXimab (RITUXAN IV) Inject into the vein. [employer]. Next Dose 03/10/17. Dr. Hollie Salk Prescibing Dr. Maryjane Hurter dose 02/28/17   Yes [provider]  vitamin B-12 (CYANOCOBALAMIN) 1000 MCG tablet Take 1 tablet (1,000 mcg total) by mouth daily. 03/02/17  Yes Eugenie Filler, MD  warfarin (COUMADIN) 2.5 MG tablet Take 0.5-1 tablets (1.25-2.5 mg total) by mouth See admin instructions. Take 1.25mg  (1/2 tablet) by mouth on Monday, Wednesday, and Friday.  Take 1 tablet (2.5mg ) by mouth on Tuesday, Thursday, Saturday, and sunday Patient taking differently: Take 1.25-2.5 mg by mouth See admin instructions. 1.25 mg on Monday, Wednesday and Friday. 2.5 mg on Tuesday, Thursday, Saturday and Sunday. 03/02/17  Yes Eugenie Filler, MD  dicyclomine (BENTYL) 10 MG capsule Take 1 capsule (10 mg total) by mouth 3 (three) times daily as needed for spasms. Patient not taking: Reported on 03/04/2017 01/21/17   Regalado, Jerald Kief A, MD  famotidine (PEPCID) 20 MG tablet Take 1 tablet (20 mg total) by mouth 2 (two) times daily. Patient not taking: Reported on 03/04/2017 01/21/17   Niel Hummer A, MD  protein supplement (UNJURY CHICKEN SOUP) POWD Take 7 g (2 oz total) by mouth 2 (two) times daily with a meal. Patient not taking: Reported on  03/04/2017 03/02/17   Eugenie Filler, MD  saccharomyces boulardii (FLORASTOR) 250 MG capsule Take 1 capsule (250 mg total) by mouth 2 (two) times daily. Patient not taking: Reported on 03/04/2017 01/21/17   Elmarie Shiley, MD    Physical Exam: Vitals:   03/04/17 1057 03/04/17 1108 03/04/17 1504  BP:  132/86 122/70  Pulse:  (!) 102 85  Resp:  18 17  SpO2: 97% 99% 97%    Constitutional: NAD, calm, comfortable Vitals:   03/04/17 1057 03/04/17 1108 03/04/17 1504  BP:  132/86 122/70  Pulse:  (!) 102 85  Resp:  18 17  SpO2: 97% 99% 97%   Eyes: PERRL, lids and conjunctivae normal ENMT: Mucous membranes are moist. Posterior pharynx clear of any exudate or lesions.Normal dentition.  Neck: normal, supple, no masses, no thyromegaly Respiratory: clear to auscultation bilaterally, no wheezing, no crackles. Normal respiratory effort. No accessory muscle use.  Cardiovascular: Regular rate and rhythm, no murmurs / rubs / gallops. No extremity edema. 2+ pedal pulses. No carotid bruits.  Abdomen: no tenderness, no masses palpated. No hepatosplenomegaly. Bowel sounds positive.  Musculoskeletal: no clubbing / cyanosis. No joint deformity upper and lower extremities. Good ROM, no contractures. Normal muscle tone.  Skin: no rashes, lesions, ulcers. No induration Neurologic: CN 2-12 grossly intact. Sensation intact, DTR normal. Strength 5/5 in all 4.  Psychiatric: Normal judgment and insight. Alert and oriented x 3. Normal mood.     Labs on Admission: I have personally reviewed following labs and imaging studies  CBC:  Recent Labs Lab 02/27/17 0605  02/27/17 1940 02/28/17 0424 03/01/17 0519 03/02/17 0419 03/04/17 1313 03/04/17 1325  WBC 9.1  --  8.7 9.2 7.2  --  10.2  --   NEUTROABS  --   --   --   --   --   --  9.4*  --   HGB 7.1*  < > 8.5* 8.1* 8.2* 8.6* 8.9* 8.5*  HCT 22.2*  < > 26.1* 24.3* 25.5* 26.3* 25.0* 25.0*  MCV 90.6  --  88.8 87.7 90.4  --  93.3  --   PLT 254  --  224 224  195  --  250  --   < > =  values in this interval not displayed. Basic Metabolic Panel:  Recent Labs Lab 02/27/17 0605 02/28/17 0424 03/01/17 0519 03/02/17 0419 03/04/17 1313 03/04/17 1325  NA 139 137 140 139 139 140  K 4.7 4.3 4.3 4.3 3.9 3.9  CL 109 108 110 110 107 107  CO2 19* 21* 22 22 20*  --   GLUCOSE 146* 112* 130* 140* 133* 129*  BUN 85* 79* 69* 68* 72* 69*  CREATININE 4.17* 3.69* 3.53* 3.53* 3.50* 3.50*  CALCIUM 8.5* 8.2* 7.9* 8.3* 8.6*  --   PHOS  --  4.8*  --  5.1*  --   --    GFR: Estimated Creatinine Clearance: 15.7 mL/min (A) (by C-G formula based on SCr of 3.5 mg/dL (H)). Liver Function Tests:  Recent Labs Lab 02/26/17 1201 02/27/17 0605 02/28/17 0424 03/02/17 0419 03/04/17 1313  AST 30 31  --   --  27  ALT 37 39  --   --  54  ALKPHOS 60 50  --   --  62  BILITOT 1.8* 1.4*  --   --  1.3*  PROT 6.0* 4.9*  --   --  5.5*  ALBUMIN 3.2* 2.7* 2.6* 2.7* 3.0*    Recent Labs Lab 02/26/17 1201  LIPASE 136*   No results for input(s): AMMONIA in the last 168 hours. Coagulation Profile:  Recent Labs Lab 02/27/17 0605 02/28/17 0424 03/01/17 0519 03/02/17 0419 03/04/17 1313  INR 2.67 2.81 3.77 3.12 2.25   Cardiac Enzymes:  Recent Labs Lab 02/26/17 1201 02/26/17 1759 02/27/17 0018 02/27/17 0605  TROPONINI 0.03* 0.03* 0.03* 0.03*   BNP (last 3 results) No results for input(s): PROBNP in the last 8760 hours. HbA1C: No results for input(s): HGBA1C in the last 72 hours. CBG:  Recent Labs Lab 03/01/17 1209 03/01/17 1742 03/01/17 2205 03/02/17 0819 03/02/17 1127  GLUCAP 145* 221* 231* 114* 178*   Lipid Profile: No results for input(s): CHOL, HDL, LDLCALC, TRIG, CHOLHDL, LDLDIRECT in the last 72 hours. Thyroid Function Tests: No results for input(s): TSH, T4TOTAL, FREET4, T3FREE, THYROIDAB in the last 72 hours. Anemia Panel: No results for input(s): VITAMINB12, FOLATE, FERRITIN, TIBC, IRON, RETICCTPCT in the last 72 hours. Urine  analysis:    Component Value Date/Time   COLORURINE YELLOW 02/26/2017 [place] 02/26/2017 1525   LABSPEC 1.010 02/26/2017 1525   PHURINE 5.0 02/26/2017 1525   GLUCOSEU 50 (A) 02/26/2017 1525   HGBUR LARGE (A) 02/26/2017 1525   BILIRUBINUR NEGATIVE 02/26/2017 1525   KETONESUR NEGATIVE 02/26/2017 1525   PROTEINUR 100 (A) 02/26/2017 1525   NITRITE NEGATIVE 02/26/2017 1525   LEUKOCYTESUR TRACE (A) 02/26/2017 1525    Radiological Exams on Admission: Dg Chest 2 View  Result Date: 03/04/2017 CLINICAL DATA:  Initial evaluation for acute weakness. EXAM: CHEST  2 VIEW COMPARISON:  Prior radiograph from 02/26/2017. FINDINGS: Cardiac and mediastinal silhouettes are stable in size and contour, and remain within normal limits. Elevation of the right hemidiaphragm, stable from previous. No focal infiltrate, pulmonary edema, or pleural effusion. No pneumothorax. No acute osseus abnormality. IMPRESSION: 1. No active cardiopulmonary disease. 2. Elevation of the right hemidiaphragm, similar to prior, and likely chronic. Electronically Signed   By: Jeannine Boga M.D.   On: 03/04/2017 13:46    EKG: Independently reviewed. A. fib with RVR normal axes nonspecific T-wave changes.  Assessment/Plan Atrial fibrillation with RVR ([place]): We'll start her on IV diltiazem for heart rate less than 100. Continue her metoprolol. She denies any  chest pain first cardio biomarkers is negative 1. Her nausea and elevation of her lactic acid is probably due to hypoperfusion due to her RVR. Continue Coumadin per pharmacy.  Nausea: Electrolytes are within normal, will give Zofran for nausea as if she can tolerate her oral medication. D due to her RVR. Presentation was in the hospital was thought that her nausea and vomiting was due to cyclophosphamide this was DC'd and changed to rituximab.  Essential  Hypertension Continue current home regimen.  IPF (idiopathic pulmonary fibrosis)  (H[facility]) Continue steroid saturations are stable.  Glomerulonephritis Continue steroids.    Chronic diastolic CHF (congestive heart failure) (HCC) Seems to be euvolemic, continue current medications.  Diabetes mellitus with complication ([place]) Start a sliding scale insulin check a hemoglobin A1c.  Elevation of lactic acid: There is likely due to hypoperfusion due to RVR she has remained afebrile with no leukocytosis is unlikely to sepsis.   DVT prophylaxis: cOUMADIN Code Status: FUL;L Family Communication: DAUGHTER Disposition Plan:  Consults called: none Admission status: inpatient   Charlynne Cousins MD [facility] Pager [phone]  If 7PM-7AM, please contact night-coverage [email or web address] Password [email or web address]  03/04/2017, 4:10 PM

## 2017-03-04 NOTE — ED Triage Notes (Signed)
Per EMS, pt is coming from home after experiencing a fall at home. Pt reports sliding from the toilet. No injuries noted and pt denies hitting head. When asked how pt was feeling pt stated "I feel like I am not in this world". Pt is AO x4. [facility] was [facility].

## 2017-03-04 NOTE — ED Provider Notes (Signed)
[facility] DEPT Provider Note   CSN: [id number] Arrival date & time: 03/04/17  1033     History   Chief Complaint No chief complaint on file.   HPI Jody Ruiz is a 76 y.o. female.  The history is provided by the patient and the EMS personnel. No language interpreter was used.   Jody Ruiz is a 76 y.o. female who presents to the Emergency Department complaining of weakness.  She presents via EMS for evaluation of weakness. She has a history of kidney disease and is on chronic steroids since April. She was discharged from the hospital 2 days ago following admission for dehydration. She states she took her medications as directed last night and she went to bed. When she woke up she felt nauseous and was unable to take her medications she was sitting on the commode when she slipped and fell to the floor. She denies any injuries in this fall. She did not take her daily steroids today. She denies any fevers, chest pain, shortness of breath. She does report nausea and abdominal discomfort as well as generalized weakness. Past Medical History:  Diagnosis Date  . Arthritis   . Atrial fibrillation ([place])   . Borderline hypothyroidism   . CAD (coronary artery disease)    a. 04/2012 NSTEMI/Cath:  pLAD 90%, mLAD 70-80% (long) (3x16 & 2.5x28 Promus DES to p/m LAD), pD1 70-80%, pRI 30% (small), CFX 20%, OM1 40%, mOM2 30%, pRCA 30%, PDA 30-40%, pPLB 70%, then mid 90%, EF 65%;  b. 03/2013 Abnl CL w/apical isch;  c. 04/2013 Cath: LM nl, LAD patent stents, LCX <20, RCA 30p PDA 40-50 EF 55-60% ->Cont Med Rx.  c. CP s/p LHC with stable dz and patent stents--> Rx medically   . Cataract   . Dyspnea    a. CP and SOB 06/2012 => Ticagrelor changed to Plavix  . History of Clostridium difficile infection   . HLD (hyperlipidemia)    intolerant to statins  . Hx of adenomatous colonic polyps   . Hx of echocardiogram    a. Echo 2/14:  mild LVH, EF 55-65%, Gr 1 diast dysfn, mild LAE  . Hypertension    . Idiopathic pulmonary fibrosis   . Internal hemorrhoids   . Lung nodules   . Microscopic colitis   . Myocardial infarction ([place]) 2013  . Obesity   . OSA (obstructive sleep apnea)    a. on CPAP  . Sleep apnea    cpap    Patient Active Problem List   Diagnosis Date Noted  . Syncope 02/26/2017  . PAF (paroxysmal atrial fibrillation) ([place]) 02/26/2017  . Lactic acidemia 02/26/2017  . Chronic diastolic CHF (congestive heart failure) ([place]) 02/26/2017  . Diabetes mellitus with complication ([place]) 85/46/2703  . Dehydration   . Long term (current) use of anticoagulants [Z79.01] 02/02/2017  . SOB (shortness of breath)   . Atrial fibrillation with RVR ([place])   . Glomerulonephritis   . Loose stools   . History of Clostridium difficile colitis   . AKI (acute kidney injury) ([place]) 01/14/2017  . Dyspepsia   . Acute kidney injury superimposed on chronic kidney disease ([place]) 01/13/2017  . H/O umbilical hernia repair 50/03/3817  . Belching 01/02/2017  . Gastroesophageal reflux disease 01/02/2017  . Right sided abdominal pain 01/02/2017  . Palpitations   . Tachycardia-bradycardia syndrome ([place])   . Chest pain 11/05/2016  . Renal insufficiency 10/16/2016  . B12 deficiency 10/16/2016  . Enteritis due to Clostridium difficile   .  Diarrhea with dehydration 10/14/2016  . Microscopic colitis 10/02/2016  . Hx of adenomatous colonic polyps 10/02/2016  . Altered mental status   . CAD (coronary artery disease)   . Interstitial lung disease ([place])   . HLD (hyperlipidemia)   . Precordial pain 07/17/2013  . Midsternal chest pain 07/17/2013  . Pulmonary nodule 11/22/2012  . 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 OSA (obstructive sleep apnea) 10/05/2012  . IPF (idiopathic pulmonary fibrosis) ([place]) 08/26/2012  . Coronary atherosclerosis of native coronary artery 05/04/2012  . Hypertension   . Obesity     Past Surgical History:  Procedure Laterality Date  . APPENDECTOMY  1994  . BREAST  BIOPSY  1965, 1975   bilateral  . CARDIAC CATHETERIZATION     x4  . CHOLECYSTECTOMY  2004  . CORONARY STENT PLACEMENT  04/2012   x2  . KNEE ARTHROSCOPY  2000   bilateral  . LEFT AND RIGHT HEART CATHETERIZATION WITH CORONARY ANGIOGRAM N/A 08/02/2014   Procedure: LEFT AND RIGHT HEART CATHETERIZATION WITH CORONARY ANGIOGRAM;  Surgeon: Burnell Blanks, MD;  Location: [facility] CATH LAB;  Service: Cardiovascular;  Laterality: N/A;  . LEFT HEART CATHETERIZATION WITH CORONARY ANGIOGRAM N/A 04/29/2012   Procedure: LEFT HEART CATHETERIZATION WITH CORONARY ANGIOGRAM;  Surgeon: Peter M Martinique, MD;  Location: [facility] CATH LAB;  Service: Cardiovascular;  Laterality: N/A;  . LEFT HEART CATHETERIZATION WITH CORONARY ANGIOGRAM N/A 08/03/2012   Procedure: LEFT HEART CATHETERIZATION WITH CORONARY ANGIOGRAM;  Surgeon: Burnell Blanks, MD;  Location: [facility] CATH LAB;  Service: Cardiovascular;  Laterality: N/A;  . LEFT HEART CATHETERIZATION WITH CORONARY ANGIOGRAM N/A 04/12/2013   Procedure: LEFT HEART CATHETERIZATION WITH CORONARY ANGIOGRAM;  Surgeon: Peter M Martinique, MD;  Location: [facility] CATH LAB;  Service: Cardiovascular;  Laterality: N/A;  . PERCUTANEOUS CORONARY STENT INTERVENTION (PCI-S) N/A 04/29/2012   Procedure: PERCUTANEOUS CORONARY STENT INTERVENTION (PCI-S);  Surgeon: Peter M Martinique, MD;  Location: [facility] CATH LAB;  Service: Cardiovascular;  Laterality: N/A;  . SHOULDER ARTHROSCOPY  2012   left  . vaginal polypectomy  2004  . VENTRAL HERNIA REPAIR  2005    OB History    No data available       Home Medications    Prior to Admission medications   Medication Sig Start Date End Date Taking? Authorizing Provider  amLODipine (NORVASC) 5 MG tablet Take 5 mg by mouth daily.    [provider]  aspirin EC 81 MG tablet Take 81 mg by mouth daily.    [provider]  dicyclomine (BENTYL) 10 MG capsule Take 1 capsule (10 mg total) by mouth 3 (three) times daily as needed for spasms. 01/21/17    Regalado, Belk[place] A, MD  famotidine (PEPCID) 20 MG tablet Take 1 tablet (20 mg total) by mouth 2 (two) times daily. 01/21/17   Regalado, Belkys A, MD  isosorbide mononitrate (IMDUR) 60 MG 24 hr tablet Take 60 mg by mouth at bedtime.    [provider]  metoprolol tartrate (LOPRESSOR) 25 MG tablet Take 1 tablet (25 mg total) by mouth 3 (three) times daily. 03/02/17   Eugenie Filler, MD  nitroGLYCERIN (NITROSTAT) 0.4 MG SL tablet Place 1 tablet (0.4 mg total) under the tongue every 5 (five) minutes x 3 doses as needed for chest pain. 08/03/14   Eileen Stanford, PA-C  ondansetron (ZOFRAN) 4 MG tablet Take 1 tablet (4 mg total) by mouth every 8 (eight) hours as needed for nausea or vomiting. 01/08/17   Scarlette Shorts  N, MD  predniSONE (DELTASONE) 10 MG tablet Take by mouth See admin instructions. Take as directed by doctor with a taper dose 02/13/17   [provider]  protein supplement (UNJURY CHICKEN SOUP) POWD Take 7 g (2 oz total) by mouth 2 (two) times daily with a meal. 03/02/17   Eugenie Filler, MD  saccharomyces boulardii (FLORASTOR) 250 MG capsule Take 1 capsule (250 mg total) by mouth 2 (two) times daily. 01/21/17   Regalado, Belkys A, MD  sucralfate (CARAFATE) 1 GM/10ML suspension Take 1 g by mouth 2 (two) times daily as needed.    [provider]  vitamin B-12 (CYANOCOBALAMIN) 1000 MCG tablet Take 1 tablet (1,000 mcg total) by mouth daily. 03/02/17   Eugenie Filler, MD  warfarin (COUMADIN) 2.5 MG tablet Take 0.5-1 tablets (1.25-2.5 mg total) by mouth See admin instructions. Take 1.25mg  (1/2 tablet) by mouth on Monday, Wednesday, and Friday.  Take 1 tablet (2.5mg ) by mouth on Tuesday, Thursday, Saturday, and sunday 03/02/17   Eugenie Filler, MD    Family History Family History  Problem Relation Age of Onset  . Breast cancer Sister   . Prostate cancer Father   . Colon polyps Father   . Heart disease Father 55       CAD, died of ESRD  . Kidney disease  Father   . Colon cancer Sister 35  . Heart disease Mother 6       Died acute MI  . Heart disease Brother 76       CABG  . Esophageal cancer Neg Hx   . Rectal cancer Neg Hx   . Stomach cancer Neg Hx     Social History Social History  Substance Use Topics  . Smoking status: Never Smoker  . Smokeless tobacco: Never Used     Comment: second hand smoke   . Alcohol use No     Allergies   Brilinta [ticagrelor]; Lisinopril; Statins; Zetia [ezetimibe]; Macrodantin [nitrofurantoin macrocrystal]; and Sulfa antibiotics   Review of Systems Review of Systems  All other systems reviewed and are negative.    Physical Exam Updated Vital Signs LMP  (LMP Unknown)   Physical Exam  Constitutional: She is oriented to person, place, and time. She appears well-developed and well-nourished.  HENT:  Head: Normocephalic and atraumatic.  Cardiovascular: Normal rate and regular rhythm.   No murmur heard. Pulmonary/Chest: Effort normal and breath sounds normal. No respiratory distress.  Abdominal: Soft. There is no tenderness. There is no rebound and no guarding.  Musculoskeletal: She exhibits no edema or tenderness.  Neurological: She is alert and oriented to person, place, and time.  Generalized weakness and poor effort on strength testing  Skin: Skin is warm and dry. There is pallor.  Psychiatric:  Flat affect with poor eye contact.  Nursing note and vitals reviewed.    ED Treatments / Results  Labs (all labs ordered are listed, but only abnormal results are displayed) Labs Reviewed  COMPREHENSIVE METABOLIC PANEL  CBC WITH DIFFERENTIAL/PLATELET  PROTIME-INR  I-STAT TROPONIN, ED    EKG  EKG Interpretation None       Radiology No results found.  Procedures Procedures (including critical care time)  Medications Ordered in ED Medications  ondansetron (ZOFRAN) injection 4 mg (not administered)  methylPREDNISolone sodium succinate (SOLU-MEDROL) 125 mg/2 mL injection 125  mg (not administered)     Initial Impression / Assessment and Plan / ED Course  I have reviewed the triage vital signs and the nursing  notes.  Pertinent labs & imaging results that were available during my care of the patient were reviewed by me and considered in my medical decision making (see chart for details).     Patient with history of CKD on chronic steroids as well as atrial fibrillation here for generalized weakness.she does have generalized weakness on examination with poor effort. She is tearful and endorses not wishing to live like this anymore. Labs demonstrates stable renal insufficiency. She was given gentle fluid hydration in the department. There is no evidence of acute infectious process. She was noted to be in rapid atrial fibrillation, which improved after oral dose of her home metoprolol. Hospitalist consulted for ongoing treatment of her generalized weakness.  Final Clinical Impressions(s) / ED Diagnoses   Final diagnoses:  Atrial fibrillation with rapid ventricular response ([place])  Weakness    New Prescriptions New Prescriptions   No medications on file     Quintella Reichert, MD 03/04/17 [phone]

## 2017-03-04 NOTE — ED Notes (Signed)
PATIENT UNABLE TO STAND FOR ORTHOSTATIC'S

## 2017-03-04 NOTE — ED Notes (Signed)
Bed: FF63 Expected date:  Expected time:  Means of arrival:  Comments: EMS/ lethargy

## 2017-03-04 NOTE — ED Notes (Addendum)
Peripheral iv inserted. Unable to draw labs with insertion. MD notified.

## 2017-03-04 NOTE — Progress Notes (Signed)
Case reviewed for DC needs. [place], MHA,BSN [phone]

## 2017-03-04 NOTE — ED Notes (Signed)
PATIENT UNABLE TO STAND FOR ORTHOSTATICS

## 2017-03-04 NOTE — ED Notes (Signed)
Patient denies pain and is resting comfortably.  

## 2017-03-05 ENCOUNTER — Encounter (HOSPITAL_COMMUNITY): Payer: Self-pay

## 2017-03-05 LAB — GLUCOSE, CAPILLARY
GLUCOSE-CAPILLARY: 200 mg/dL — AB (ref 65–99)
Glucose-Capillary: 177 mg/dL — ABNORMAL HIGH (ref 65–99)

## 2017-03-05 LAB — CBG MONITORING, ED
GLUCOSE-CAPILLARY: 167 mg/dL — AB (ref 65–99)
Glucose-Capillary: 149 mg/dL — ABNORMAL HIGH (ref 65–99)

## 2017-03-05 LAB — CBC
HEMATOCRIT: 23.6 % — AB (ref 36.0–46.0)
Hemoglobin: 8.1 g/dL — ABNORMAL LOW (ref 12.0–15.0)
MCH: 31.6 pg (ref 26.0–34.0)
MCHC: 34.3 g/dL (ref 30.0–36.0)
MCV: 92.2 fL (ref 78.0–100.0)
Platelets: 209 10*3/uL (ref 150–400)
RBC: 2.56 MIL/uL — ABNORMAL LOW (ref 3.87–5.11)
RDW: 17.5 % — AB (ref 11.5–15.5)
WBC: 9.9 10*3/uL (ref 4.0–10.5)

## 2017-03-05 LAB — PROTIME-INR
INR: 2.44
Prothrombin Time: 26.3 seconds — ABNORMAL HIGH (ref 11.4–15.2)

## 2017-03-05 LAB — CK: Total CK: 49 U/L (ref 38–234)

## 2017-03-05 MED ORDER — POLYETHYLENE GLYCOL 3350 17 G PO PACK: 17.0000 g | PACK | Freq: Every day | ORAL | Status: DC | PRN

## 2017-03-05 MED ORDER — VITAMIN B-12 1000 MCG PO TABS: 2000.0000 ug | ORAL_TABLET | Freq: Every day | ORAL | Status: DC

## 2017-03-05 MED ORDER — SODIUM CHLORIDE 0.9% FLUSH
3.0000 mL | Freq: Two times a day (BID) | INTRAVENOUS | Status: DC
  Administered 2017-03-05 – 2017-03-13 (×14): 3 mL via INTRAVENOUS

## 2017-03-05 MED ORDER — INSULIN ASPART 100 UNIT/ML ~~LOC~~ SOLN: 0.0000 [IU] | Freq: Every day | SUBCUTANEOUS | Status: DC

## 2017-03-05 MED ORDER — NITROGLYCERIN 0.4 MG SL SUBL: 0.4000 mg | SUBLINGUAL_TABLET | SUBLINGUAL | Status: DC | PRN

## 2017-03-05 MED ORDER — CYANOCOBALAMIN 1000 MCG/ML IJ SOLN
1000.0000 ug | Freq: Every morning | INTRAMUSCULAR | Status: DC
  Administered 2017-03-05 – 2017-03-06 (×2): 1000 ug via INTRAMUSCULAR
  Filled 2017-03-05 (×3): qty 1

## 2017-03-05 MED ORDER — PREDNISONE 5 MG PO TABS: 5.0000 mg | ORAL_TABLET | Freq: Every day | ORAL | Status: DC

## 2017-03-05 MED ORDER — PREDNISONE 20 MG PO TABS: 20.0000 mg | ORAL_TABLET | Freq: Every day | ORAL | Status: DC

## 2017-03-05 MED ORDER — VITAMIN B-12 1000 MCG PO TABS
1000.0000 ug | ORAL_TABLET | Freq: Every day | ORAL | Status: DC
  Filled 2017-03-05: qty 1

## 2017-03-05 MED ORDER — CYANOCOBALAMIN 1000 MCG/ML IJ SOLN
1000.0000 ug | Freq: Every day | INTRAMUSCULAR | Status: DC
  Filled 2017-03-05: qty 1

## 2017-03-05 MED ORDER — FAMOTIDINE 20 MG PO TABS
20.0000 mg | ORAL_TABLET | Freq: Two times a day (BID) | ORAL | Status: DC
  Filled 2017-03-05: qty 1

## 2017-03-05 MED ORDER — METOPROLOL TARTRATE 25 MG PO TABS: 25.0000 mg | ORAL_TABLET | Freq: Two times a day (BID) | ORAL | Status: DC

## 2017-03-05 MED ORDER — SODIUM CHLORIDE 0.9 % IV SOLN
250.0000 mL | INTRAVENOUS | Status: DC | PRN
  Administered 2017-03-06: 250 mL via INTRAVENOUS

## 2017-03-05 MED ORDER — ONDANSETRON HCL 4 MG/2ML IJ SOLN
4.0000 mg | Freq: Four times a day (QID) | INTRAMUSCULAR | Status: DC | PRN
  Administered 2017-03-05 – 2017-03-13 (×6): 4 mg via INTRAVENOUS
  Filled 2017-03-05 (×6): qty 2

## 2017-03-05 MED ORDER — SODIUM CHLORIDE 0.9 % IV SOLN: 250.0000 mL | INTRAVENOUS | Status: DC | PRN

## 2017-03-05 MED ORDER — PREDNISONE 5 MG PO TABS: 30.0000 mg | ORAL_TABLET | Freq: Every day | ORAL | Status: DC

## 2017-03-05 MED ORDER — ONDANSETRON HCL 4 MG PO TABS: 4.0000 mg | ORAL_TABLET | Freq: Four times a day (QID) | ORAL | Status: DC | PRN

## 2017-03-05 MED ORDER — AMLODIPINE BESYLATE 5 MG PO TABS
5.0000 mg | ORAL_TABLET | Freq: Every day | ORAL | Status: DC
  Administered 2017-03-05 – 2017-03-07 (×3): 5 mg via ORAL
  Filled 2017-03-05 (×4): qty 1

## 2017-03-05 MED ORDER — METOPROLOL TARTRATE 25 MG PO TABS
25.0000 mg | ORAL_TABLET | Freq: Three times a day (TID) | ORAL | Status: DC
  Administered 2017-03-05 – 2017-03-06 (×2): 25 mg via ORAL
  Filled 2017-03-05 (×2): qty 1

## 2017-03-05 MED ORDER — PREDNISONE 5 MG PO TABS: 10.0000 mg | ORAL_TABLET | Freq: Every day | ORAL | Status: DC

## 2017-03-05 MED ORDER — SACCHAROMYCES BOULARDII 250 MG PO CAPS
250.0000 mg | ORAL_CAPSULE | Freq: Two times a day (BID) | ORAL | Status: DC
  Filled 2017-03-05 (×7): qty 1

## 2017-03-05 MED ORDER — DICYCLOMINE HCL 10 MG PO CAPS
10.0000 mg | ORAL_CAPSULE | Freq: Three times a day (TID) | ORAL | Status: DC | PRN
  Filled 2017-03-05: qty 1

## 2017-03-05 MED ORDER — TRAMADOL HCL 50 MG PO TABS: 50.0000 mg | ORAL_TABLET | Freq: Four times a day (QID) | ORAL | Status: DC | PRN

## 2017-03-05 MED ORDER — METOPROLOL TARTRATE 25 MG PO TABS
25.0000 mg | ORAL_TABLET | Freq: Three times a day (TID) | ORAL | Status: DC
  Filled 2017-03-05: qty 1

## 2017-03-05 MED ORDER — SODIUM CHLORIDE 0.9% FLUSH
3.0000 mL | INTRAVENOUS | Status: DC | PRN
  Administered 2017-03-09: 3 mL via INTRAVENOUS
  Filled 2017-03-05: qty 3

## 2017-03-05 MED ORDER — PREDNISONE 5 MG PO TABS: 5.0000 mg | ORAL_TABLET | ORAL | Status: DC

## 2017-03-05 MED ORDER — ACETAMINOPHEN 650 MG RE SUPP: 650.0000 mg | Freq: Four times a day (QID) | RECTAL | Status: DC | PRN

## 2017-03-05 MED ORDER — INSULIN ASPART 100 UNIT/ML ~~LOC~~ SOLN
0.0000 [IU] | Freq: Three times a day (TID) | SUBCUTANEOUS | Status: DC
Start: 2017-03-05 — End: 2017-03-13
  Filled 2017-03-05: qty 1

## 2017-03-05 MED ORDER — ASPIRIN EC 81 MG PO TBEC
81.0000 mg | DELAYED_RELEASE_TABLET | Freq: Every day | ORAL | Status: DC
  Administered 2017-03-05 – 2017-03-13 (×9): 81 mg via ORAL
  Filled 2017-03-05 (×10): qty 1

## 2017-03-05 MED ORDER — PREDNISONE 20 MG PO TABS
40.0000 mg | ORAL_TABLET | Freq: Every day | ORAL | Status: AC
  Administered 2017-03-06 – 2017-03-12 (×7): 40 mg via ORAL
  Filled 2017-03-05 (×8): qty 2

## 2017-03-05 MED ORDER — ONDANSETRON HCL 4 MG PO TABS: 4.0000 mg | ORAL_TABLET | Freq: Three times a day (TID) | ORAL | Status: DC | PRN

## 2017-03-05 MED ORDER — ACETAMINOPHEN 325 MG PO TABS: 650.0000 mg | ORAL_TABLET | Freq: Four times a day (QID) | ORAL | Status: DC | PRN

## 2017-03-05 MED ORDER — POLYVINYL ALCOHOL 1.4 % OP SOLN
1.0000 [drp] | Freq: Three times a day (TID) | OPHTHALMIC | Status: DC | PRN
  Filled 2017-03-05: qty 15

## 2017-03-05 MED ORDER — WARFARIN SODIUM 2.5 MG PO TABS
1.2500 mg | ORAL_TABLET | Freq: Once | ORAL | Status: AC
  Administered 2017-03-05: 1.25 mg via ORAL
  Filled 2017-03-05: qty 1

## 2017-03-05 NOTE — ED Notes (Signed)
PT STATES WILL EAT AND TAKE MEDICATION. FAMILY WILL CALL THIS WRITER. PT AND FAMILY INFORMED HOSPITALIST WILL SEE PT AND FAMILY SHORTLY. DIET ORDERED FOR PT

## 2017-03-05 NOTE — ED Notes (Signed)
ED TO INPATIENT HANDOFF REPORT  Name/Age/Gender Jody Ruiz 76 y.o. female  Code Status    Code Status Orders        Start     Ordered   03/05/17 0829  Full code  Continuous     03/05/17 0828    Code Status History    Date Active Date Inactive Code Status Order ID Comments User Context   02/26/2017  5:37 PM 03/02/2017  8:29 PM Full Code [id number]  Waldemar Dickens, MD ED   02/26/2017  5:37 PM 02/26/2017  5:37 PM Full Code [id number]  Waldemar Dickens, MD ED   01/13/2017  6:48 PM 01/22/2017  3:49 PM Full Code [id number]  Lavina Hamman, MD Inpatient   11/05/2016 12:40 PM 11/07/2016  8:09 PM Full Code [id number]  Delos Haring, PA-C ED   10/14/2016  7:03 PM 10/17/2016  3:56 PM Full Code [id number]  Vianne Bulls, MD Inpatient   08/02/2014  4:09 PM 08/03/2014  7:09 PM Full Code [id number]  Burnell Blanks, MD Inpatient   08/01/2014  3:13 PM 08/02/2014  4:09 PM Full Code [id number]  Burtis Junes, NP ED   07/17/2013  8:14 AM 07/17/2013  8:11 PM Full Code [id number]  Minus Breeding, MD Inpatient   08/02/2012 11:27 AM 08/03/2012 10:44 AM Full Code [id number]  Laurena Slimmer, RN Inpatient   04/28/2012 10:04 PM 04/30/2012  3:10 PM Full Code [id number]  Mellissa Kohut, RN ED      Home/SNF/Other Home  Chief Complaint Generalized Weakness   Level of Care/Admitting Diagnosis ED Disposition    ED Disposition Condition [facility] Area: [facility] [[id number]]  Level of Care: Telemetry [5]  Admit to tele based on following criteria: Complex arrhythmia (Bradycardia/Tachycardia)  Diagnosis: Atrial fibrillation with RVR [facility]) [[id number]]  Admitting Physician: Reyne Dumas [[id number]]  Attending Physician: Reyne Dumas [[id number]]  Estimated length of stay: past midnight tomorrow  Certification:: I certify this patient will need inpatient services for at least 2 midnights  PT Class (Do Not Modify): Inpatient [101]  PT Acc Code (Do Not Modify): Private [1]        Medical History Past Medical History:  Diagnosis Date  . Arthritis   . Atrial fibrillation ([place])   . Borderline hypothyroidism   . CAD (coronary artery disease)    a. 04/2012 NSTEMI/Cath:  pLAD 90%, mLAD 70-80% (long) (3x16 & 2.5x28 Promus DES to p/m LAD), pD1 70-80%, pRI 30% (small), CFX 20%, OM1 40%, mOM2 30%, pRCA 30%, PDA 30-40%, pPLB 70%, then mid 90%, EF 65%;  b. 03/2013 Abnl CL w/apical isch;  c. 04/2013 Cath: LM nl, LAD patent stents, LCX <20, RCA 30p PDA 40-50 EF 55-60% ->Cont Med Rx.  c. CP s/p LHC with stable dz and patent stents--> Rx medically   . Cataract   . Dyspnea    a. CP and SOB 06/2012 => Ticagrelor changed to Plavix  . History of Clostridium difficile infection   . HLD (hyperlipidemia)    intolerant to statins  . Hx of adenomatous colonic polyps   . Hx of echocardiogram    a. Echo 2/14:  mild LVH, EF 55-65%, Gr 1 diast dysfn, mild LAE  . Hypertension   . Idiopathic pulmonary fibrosis   . Internal hemorrhoids   . Lung nodules   . Microscopic colitis   . Myocardial infarction ([place]) 2013  . Obesity   . OSA (obstructive sleep apnea)    a.  on CPAP  . Sleep apnea    cpap    Allergies Allergies  Allergen Reactions  . Brilinta [Ticagrelor] Shortness Of Breath, Other (See Comments) and Cough    Reaction:  Fatigue  . Lisinopril Shortness Of Breath and Cough  . Statins Other (See Comments)    Reaction:  Muscle pain/weakness  . Zetia [Ezetimibe] Other (See Comments)    Reaction:  Muscle pain/weakness   . Macrodantin [Nitrofurantoin Macrocrystal] Rash  . Sulfa Antibiotics Rash    IV Location/Drains/Wounds Patient Lines/Drains/Airways Status   Active Line/Drains/Airways    Name:   Placement date:   Placement time:   Site:   Days:   Peripheral IV 03/04/17 Left Forearm  03/04/17    1217    Forearm    1          Labs/Imaging Results for orders placed or performed during the hospital encounter of 03/04/17 (from the past 48 hour(s))  Comprehensive  metabolic panel     Status: Abnormal   Collection Time: 03/04/17  1:13 PM  Result Value Ref Range   Sodium 139 135 - 145 mmol/L   Potassium 3.9 3.5 - 5.1 mmol/L   Chloride 107 101 - 111 mmol/L   CO2 20 (L) 22 - 32 mmol/L   Glucose, Bld 133 (H) 65 - 99 mg/dL   BUN 72 (H) 6 - 20 mg/dL   Creatinine, Ser 2.35 (H) 0.44 - 1.00 mg/dL   Calcium 8.6 (L) 8.9 - 10.3 mg/dL   Total Protein 5.5 (L) 6.5 - 8.1 g/dL   Albumin 3.0 (L) 3.5 - 5.0 g/dL   AST 27 15 - 41 U/L   ALT 54 14 - 54 U/L   Alkaline Phosphatase 62 38 - 126 U/L   Total Bilirubin 1.3 (H) 0.3 - 1.2 mg/dL   GFR calc non Af Amer 12 (L) >60 mL/min   GFR calc Af Amer 14 (L) >60 mL/min    Comment: (NOTE) The eGFR has been calculated using the CKD EPI equation. This calculation has not been validated in all clinical situations. eGFR's persistently <60 mL/min signify possible Chronic Kidney Disease.    Anion gap 12 5 - 15  CBC with Differential     Status: Abnormal   Collection Time: 03/04/17  1:13 PM  Result Value Ref Range   WBC 10.2 4.0 - 10.5 K/uL   RBC 2.68 (L) 3.87 - 5.11 MIL/uL   Hemoglobin 8.9 (L) 12.0 - 15.0 g/dL   HCT 57.3 (L) 22.0 - 25.4 %   MCV 93.3 78.0 - 100.0 fL   MCH 33.2 26.0 - 34.0 pg   MCHC 35.6 30.0 - 36.0 g/dL   RDW 27.0 (H) 62.3 - 76.2 %   Platelets 250 150 - 400 K/uL   Neutrophils Relative % 89 %   Lymphocytes Relative 8 %   Monocytes Relative 0 %   Eosinophils Relative 0 %   Basophils Relative 0 %   Band Neutrophils 0 %   Metamyelocytes Relative 1 %   Myelocytes 2 %   Promyelocytes Absolute 0 %   Blasts 0 %   nRBC 0 0 /100 WBC   Other 0 %   Neutro Abs 9.4 (H) 1.7 - 7.7 K/uL   Lymphs Abs 0.8 0.7 - 4.0 K/uL   Monocytes Absolute 0.0 (L) 0.1 - 1.0 K/uL   Eosinophils Absolute 0.0 0.0 - 0.7 K/uL   Basophils Absolute 0.0 0.0 - 0.1 K/uL   Smear Review MORPHOLOGY UNREMARKABLE   Protime-INR  Status: Abnormal   Collection Time: 03/04/17  1:13 PM  Result Value Ref Range   Prothrombin Time 24.7 (H) 11.4  - 15.2 seconds   INR 2.25   I-stat troponin, ED     Status: None   Collection Time: 03/04/17  1:23 PM  Result Value Ref Range   Troponin i, poc 0.06 0.00 - 0.08 ng/mL   Comment 3            Comment: Due to the release kinetics of cTnI, a negative result within the first hours of the onset of symptoms does not rule out myocardial infarction with certainty. If myocardial infarction is still suspected, repeat the test at appropriate intervals.   I-stat Chem 8, ED     Status: Abnormal   Collection Time: 03/04/17  1:25 PM  Result Value Ref Range   Sodium 140 135 - 145 mmol/L   Potassium 3.9 3.5 - 5.1 mmol/L   Chloride 107 101 - 111 mmol/L   BUN 69 (H) 6 - 20 mg/dL   Creatinine, Ser 3.50 (H) 0.44 - 1.00 mg/dL   Glucose, Bld 129 (H) 65 - 99 mg/dL   Calcium, Ion 1.07 (L) 1.15 - 1.40 mmol/L   TCO2 22 22 - 32 mmol/L   Hemoglobin 8.5 (L) 12.0 - 15.0 g/dL   HCT 25.0 (L) 36.0 - 46.0 %  I-Stat CG4 Lactic Acid, ED     Status: Abnormal   Collection Time: 03/04/17  1:25 PM  Result Value Ref Range   Lactic Acid, Venous 2.32 (HH) 0.5 - 1.9 mmol/L  Type and screen [place]     Status: None   Collection Time: 03/04/17  6:01 PM  Result Value Ref Range   ABO/RH(D) B POS    Antibody Screen NEG    Sample Expiration 03/07/2017   ABO/Rh     Status: None   Collection Time: 03/04/17  6:01 PM  Result Value Ref Range   ABO/RH(D) B POS   Protime-INR     Status: Abnormal   Collection Time: 03/05/17  6:35 AM  Result Value Ref Range   Prothrombin Time 26.3 (H) 11.4 - 15.2 seconds   INR 2.44   POC CBG, ED     Status: Abnormal   Collection Time: 03/05/17  9:08 AM  Result Value Ref Range   Glucose-Capillary 149 (H) 65 - 99 mg/dL  CBG monitoring, ED     Status: Abnormal   Collection Time: 03/05/17 12:37 PM  Result Value Ref Range   Glucose-Capillary 167 (H) 65 - 99 mg/dL   Dg Chest 2 View  Result Date: 03/04/2017 CLINICAL DATA:  Initial evaluation for acute weakness. EXAM:  CHEST  2 VIEW COMPARISON:  Prior radiograph from 02/26/2017. FINDINGS: Cardiac and mediastinal silhouettes are stable in size and contour, and remain within normal limits. Elevation of the right hemidiaphragm, stable from previous. No focal infiltrate, pulmonary edema, or pleural effusion. No pneumothorax. No acute osseus abnormality. IMPRESSION: 1. No active cardiopulmonary disease. 2. Elevation of the right hemidiaphragm, similar to prior, and likely chronic. Electronically Signed   By: Jeannine Boga M.D.   On: 03/04/2017 13:46    Pending Labs Unresulted Labs    Start     Ordered   03/05/17 1215  Urinalysis, Routine w reflex microscopic  Once,   R     03/05/17 1214   03/05/17 1215  CK  Once,   R     03/05/17 1214   03/05/17 0829  CBC  Tomorrow morning,   R     03/05/17 0828   03/05/17 3149  Basic metabolic panel  Tomorrow morning,   R     03/05/17 0828   03/05/17 0829  Hemoglobin A1c  Once,   R     03/05/17 0828   03/05/17 0500  Protime-INR  Daily,   R     03/04/17 1725      Vitals/Pain Today's Vitals   03/05/17 1130 03/05/17 1200 03/05/17 1206 03/05/17 1230  BP: 139/66 128/74  (!) 149/64  Pulse: 76 81 (!) 101 97  Resp: '18 14 13 16  '$ Temp:      SpO2:  97%    Weight:      Height:      PainSc:        Isolation Precautions No active isolations  Medications Medications  amLODipine (NORVASC) tablet 5 mg (not administered)  aspirin EC tablet 81 mg (not administered)  nitroGLYCERIN (NITROSTAT) SL tablet 0.4 mg (not administered)  polyvinyl alcohol (LIQUIFILM TEARS) 1.4 % ophthalmic solution 1 drop (not administered)  sodium chloride flush (NS) 0.9 % injection 3 mL (not administered)  sodium chloride flush (NS) 0.9 % injection 3 mL (not administered)  acetaminophen (TYLENOL) tablet 650 mg (not administered)    Or  acetaminophen (TYLENOL) suppository 650 mg (not administered)  polyethylene glycol (MIRALAX / GLYCOLAX) packet 17 g (not administered)  ondansetron (ZOFRAN)  tablet 4 mg ( Oral See Alternative 03/05/17 0850)    Or  ondansetron (ZOFRAN) injection 4 mg (4 mg Intravenous Given 03/05/17 0850)  saccharomyces boulardii (FLORASTOR) capsule 250 mg (not administered)  insulin aspart (novoLOG) injection 0-9 Units (not administered)  insulin aspart (novoLOG) injection 0-5 Units (not administered)  Warfarin - Pharmacist Dosing Inpatient ( Does not apply Canceled Entry 03/05/17 1800)  0.9 %  sodium chloride infusion (not administered)  metoprolol tartrate (LOPRESSOR) tablet 25 mg (not administered)  predniSONE (DELTASONE) tablet 40 mg (not administered)    Followed by  predniSONE (DELTASONE) tablet 30 mg (not administered)    Followed by  predniSONE (DELTASONE) tablet 20 mg (not administered)    Followed by  predniSONE (DELTASONE) tablet 10 mg (not administered)    Followed by  predniSONE (DELTASONE) tablet 5 mg (not administered)    Followed by  predniSONE (DELTASONE) tablet 5 mg (not administered)  warfarin (COUMADIN) tablet 1.25 mg (not administered)  cyanocobalamin ((VITAMIN B-12)) injection 1,000 mcg (not administered)  ondansetron (ZOFRAN) injection 4 mg (4 mg Intravenous Given 03/04/17 1211)  methylPREDNISolone sodium succinate (SOLU-MEDROL) 125 mg/2 mL injection 125 mg (125 mg Intravenous Given 03/04/17 1211)  sodium chloride 0.9 % bolus 500 mL (0 mLs Intravenous Stopped 03/04/17 1544)  metoprolol tartrate (LOPRESSOR) tablet 25 mg (25 mg Oral Given 03/04/17 1329)  metoprolol tartrate (LOPRESSOR) injection 2.5 mg (2.5 mg Intravenous Given 03/04/17 1543)  diltiazem (CARDIZEM) 1 mg/mL load via infusion 15 mg (15 mg Intravenous Bolus from Bag 03/04/17 1822)  warfarin (COUMADIN) tablet 1.25 mg (1.25 mg Oral Given 03/04/17 2124)    Mobility walks with deviceo

## 2017-03-05 NOTE — ED Notes (Signed)
Patient denies pain and is resting comfortably.  

## 2017-03-05 NOTE — Care Management Note (Signed)
Case Management Note  Patient Details  Name: SUMEDHA MUNNERLYN MRN: [id number] Date of Birth: October 20, 1940  Subjective/Objective: 76 y/o f admitted w/Afib. From home. Readmit-8/23-8/27 sycnope.                   Action/Plan:d/c plan home.   Expected Discharge Date:   (unknown)               Expected Discharge Plan:  Home/Self Care  In-House Referral:     Discharge planning Services  CM Consult  Post Acute Care Choice:    Choice offered to:     DME Arranged:    DME Agency:     HH Arranged:    HH Agency:     Status of Service:  In process, will continue to follow  If discussed at Long Length of Stay Meetings, dates discussed:    Additional Comments:  Dessa Phi, RN 03/05/2017, 2:57 PM

## 2017-03-05 NOTE — ED Notes (Signed)
PT DECLINES ANY AM MEDICATIONS. SHE DOES NOT UNDERSTAND PLAN OF CARE. REQUESTING TO SPEAK WITH HOSPITALIST. REQUESTING DIET. FAMILY PRESENT

## 2017-03-05 NOTE — Progress Notes (Signed)
[place] for warfarin Indication: atrial fibrillation  Allergies  Allergen Reactions  . Brilinta [Ticagrelor] Shortness Of Breath, Other (See Comments) and Cough    Reaction:  Fatigue  . Lisinopril Shortness Of Breath and Cough  . Statins Other (See Comments)    Reaction:  Muscle pain/weakness  . Zetia [Ezetimibe] Other (See Comments)    Reaction:  Muscle pain/weakness   . Macrodantin [Nitrofurantoin Macrocrystal] Rash  . Sulfa Antibiotics Rash    Patient Measurements: Height: 5\' 7"  (170.2 cm) Weight: 185 lb (83.9 kg) IBW/kg (Calculated) : 61.6  Vital Signs: Temp: 98.1 F (36.7 C) (08/30 0300) BP: 128/74 (08/30 1200) Pulse Rate: 101 (08/30 1206)  Labs:  Recent Labs  03/04/17 1313 03/04/17 1325 03/05/17 0635  HGB 8.9* 8.5*  --   HCT 25.0* 25.0*  --   PLT 250  --   --   LABPROT 24.7*  --  26.3*  INR 2.25  --  2.44  CREATININE 3.50* 3.50*  --     Estimated Creatinine Clearance: 15.7 mL/min (A) (by C-G formula based on SCr of 3.5 mg/dL (H)).    Assessment: 77 YOF presents with weakness d/t atrial fibrillation with RVR.  History of atrial fibrillation on warfarin therapy.  INR therapeutic at time of admission. INR noted to be supratherapeutic on 8/20 at warfarin clinic (held warfarin x 2 days and resumed at regimen below)  Home warfarin: 1.25mg  MWF and 2.5mg  TTSS (last dose 8/28)  Today, 03/05/2017  INR remains therapeutic  CBC: Hgb decreased but consistent with previous values, pltc WNL  No major drug interactions. On ASA 81mg  for CAD w/ stents (2013)  Goal of Therapy:  INR 2-3   Plan:   Would be due for warfarin 2.5 mg today, but given recent supratherapeutic values and acute illness, will repeat 1.25 mg tonight until INR trend established  Daily INR, CBC at least weekly  Monitor for signs of bleeding or thrombosis  Reuel Boom, PharmD, BCPS Pager: [phone] 03/05/2017, 12:20 PM

## 2017-03-05 NOTE — ED Notes (Signed)
Cardizem remains at 5 mg /hr.

## 2017-03-05 NOTE — Progress Notes (Signed)
[facility] PROGRESS NOTE  Jody Ruiz UKG:[id number] DOB: [birth year] DOA: 03/04/2017   PCP: Burnard Bunting, MD     Assessment/Plan: Active Problems:   Hypertension   IPF (idiopathic pulmonary fibrosis) (HCC)   Glomerulonephritis   Atrial fibrillation with RVR (HCC)   Chronic diastolic CHF (congestive heart failure) ([place])   Diabetes mellitus with complication ([place])   76 y.o. 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 She relates she has progressively been getting worst initially she took a medication before going to bed the day prior to admission woke up this morning fell nauseated was not able to take her medication was sitting in the commode and fell to the floor. Patient was just discharged 8/27 after workup for near syncope, thought to be secondary to dehydration. Patient had been evaluated by physical therapy and was cleared for discharge.  Assessment and plan   Atrial fibrillation with RVR ([place]): Initially placed on IV Cardizem, now transition to oral metoprolol Continue her metoprolol. Troponin negative recent admission, now 0.06 Continue Coumadin per pharmacy. INR therapeutic Discontinued Cardizem drip  Nausea Recent CT July 2018 was negative, patient denies constipation Initially thought to be related to cyclophosphamide, now switched over to rituximab given 8/25    Essential  Hypertension Continue current home regimen.  IPF (idiopathic pulmonary fibrosis) (HCC) Continue steroid saturations are stable.  Glomerulonephritis Continue steroids. Rituximab. Followed by Dr.Upton Baseline creatinine around  3.5, creatinine at baseline today    Chronic diastolic CHF (congestive heart failure) (HCC) Seems to be euvolemic, continue current medications.  Diabetes mellitus with complication ([place]) Start a sliding scale insulin check a hemoglobin A1c.  Elevation of lactic acid: There is likely due to hypoperfusion due to RVR she has remained afebrile with no leukocytosis is unlikely to sepsis.  Fall Will need PT OT evaluation, found to have very low B-12 which could be contributing to peripheral neuropathy Will continue B-12 injections IM daily during this hospitalization  DVT prophylaxsis Coumadin  Code Status:  Full code   Family Communication: Discussed in detail with the patient, all imaging results, lab results explained to the patient   Disposition Plan:   Anticipate discharge in one to 2 days    Consultants:   None  Procedures:  None  Antibiotics: Anti-infectives    None         HPI/Subjective: Patient is refusing labs,confused ,daughter by bedside , states she was weak and legs gave out at home, clarified home meds.  Objective: Vitals:   03/05/17 1029 03/05/17 1130 03/05/17 1200 03/05/17 1206  BP: 129/85 139/66 128/74   Pulse: (!) 101 76 81 (!) 101  Resp: 16 18 14 13   Temp:      SpO2: 96%  97%   Weight:      Height:        Intake/Output Summary (Last 24 hours) at 03/05/17 1219 Last data filed at 03/04/17 2027  Gross per 24 hour  Intake                0 ml  Output              400 ml  Net             -  400 ml    Exam:  Examination:  General exam: Appears calm and comfortable  Respiratory system: Clear to auscultation. Respiratory effort normal. Cardiovascular system: S1 & S2 heard, RRR. No JVD, murmurs, rubs, gallops or clicks. No pedal edema. Gastrointestinal system: Abdomen is nondistended, soft and nontender. No organomegaly or masses felt. Normal bowel sounds heard. Central nervous system: Alert and oriented. No focal neurological  deficits. Extremities: Symmetric 5 x 5 power. Skin: No rashes, lesions or ulcers Psychiatry: Judgement and insight appear normal. Mood & affect appropriate.     Data Reviewed: I have personally reviewed following labs and imaging studies  Micro Results Recent Results (from the past 240 hour(s))  Urine culture     Status: Abnormal   Collection Time: 02/26/17  3:25 PM  Result Value Ref Range Status   Specimen Description URINE, RANDOM  Final   Special Requests NONE  Final   Culture <10,000 COLONIES/mL (A)  Final   Report Status 02/28/2017 FINAL  Final    Radiology Reports Dg Chest 2 View  Result Date: 03/04/2017 CLINICAL DATA:  Initial evaluation for acute weakness. EXAM: CHEST  2 VIEW COMPARISON:  Prior radiograph from 02/26/2017. FINDINGS: Cardiac and mediastinal silhouettes are stable in size and contour, and remain within normal limits. Elevation of the right hemidiaphragm, stable from previous. No focal infiltrate, pulmonary edema, or pleural effusion. No pneumothorax. No acute osseus abnormality. IMPRESSION: 1. No active cardiopulmonary disease. 2. Elevation of the right hemidiaphragm, similar to prior, and likely chronic. Electronically Signed   By: Jeannine Boga M.D.   On: 03/04/2017 13:46   Dg Chest 2 View  Result Date: 02/26/2017 CLINICAL DATA:  Chronic renal failure. The patient has been lethargic. EXAM: CHEST  2 VIEW COMPARISON:  January 16, 2017 FINDINGS: Elevation of the right hemidiaphragm persists. The heart, hila, mediastinum, lungs, and pleura are otherwise normal. No acute interval changes. IMPRESSION: No active cardiopulmonary disease. Electronically Signed   By: Dorise Bullion III M.D   On: 02/26/2017 12:39     CBC  Recent Labs Lab 02/27/17 7846  02/27/17 1940 02/28/17 0424 03/01/17 0519 03/02/17 0419 03/04/17 1313 03/04/17 1325  WBC 9.1  --  8.7 9.2 7.2  --  10.2  --   HGB 7.1*  < > 8.5* 8.1* 8.2* 8.6* 8.9* 8.5*  HCT 22.2*  < > 26.1* 24.3* 25.5*  26.3* 25.0* 25.0*  PLT 254  --  224 224 195  --  250  --   MCV 90.6  --  88.8 87.7 90.4  --  93.3  --   MCH 29.0  --  28.9 29.2 29.1  --  33.2  --   MCHC 32.0  --  32.6 33.3 32.2  --  35.6  --   RDW 15.7*  --  16.3* 16.7* 17.3*  --  17.6*  --   LYMPHSABS  --   --   --   --   --   --  0.8  --   MONOABS  --   --   --   --   --   --  0.0*  --   EOSABS  --   --   --   --   --   --  0.0  --   BASOSABS  --   --   --   --   --   --  0.0  --   < > = values in this interval not displayed.  Chemistries   Recent  Labs Lab 02/27/17 0605 02/28/17 0424 03/01/17 0519 03/02/17 0419 03/04/17 1313 03/04/17 1325  NA 139 137 140 139 139 140  K 4.7 4.3 4.3 4.3 3.9 3.9  CL 109 108 110 110 107 107  CO2 19* 21* 22 22 20*  --   GLUCOSE 146* 112* 130* 140* 133* 129*  BUN 85* 79* 69* 68* 72* 69*  CREATININE 4.17* 3.69* 3.53* 3.53* 3.50* 3.50*  CALCIUM 8.5* 8.2* 7.9* 8.3* 8.6*  --   AST 31  --   --   --  27  --   ALT 39  --   --   --  54  --   ALKPHOS 50  --   --   --  62  --   BILITOT 1.4*  --   --   --  1.3*  --    ------------------------------------------------------------------------------------------------------------------ estimated creatinine clearance is 15.7 mL/min (A) (by C-G formula based on SCr of 3.5 mg/dL (H)). ------------------------------------------------------------------------------------------------------------------ No results for input(s): HGBA1C in the last 72 hours. ------------------------------------------------------------------------------------------------------------------ No results for input(s): CHOL, HDL, LDLCALC, TRIG, CHOLHDL, LDLDIRECT in the last 72 hours. ------------------------------------------------------------------------------------------------------------------ No results for input(s): TSH, T4TOTAL, T3FREE, THYROIDAB in the last 72 hours.  Invalid input(s):  FREET3 ------------------------------------------------------------------------------------------------------------------ No results for input(s): VITAMINB12, FOLATE, FERRITIN, TIBC, IRON, RETICCTPCT in the last 72 hours.  Coagulation profile  Recent Labs Lab 02/28/17 0424 03/01/17 0519 03/02/17 0419 03/04/17 1313 03/05/17 0635  INR 2.81 3.77 3.12 2.25 2.44    No results for input(s): DDIMER in the last 72 hours.  Cardiac Enzymes  Recent Labs Lab 02/26/17 1759 02/27/17 0018 02/27/17 0605  TROPONINI 0.03* 0.03* 0.03*   ------------------------------------------------------------------------------------------------------------------ Invalid input(s): POCBNP   CBG:  Recent Labs Lab 03/01/17 1742 03/01/17 2205 03/02/17 0819 03/02/17 1127 03/05/17 0908  GLUCAP 221* 231* 114* 178* 149*       Studies: Dg Chest 2 View  Result Date: 03/04/2017 CLINICAL DATA:  Initial evaluation for acute weakness. EXAM: CHEST  2 VIEW COMPARISON:  Prior radiograph from 02/26/2017. FINDINGS: Cardiac and mediastinal silhouettes are stable in size and contour, and remain within normal limits. Elevation of the right hemidiaphragm, stable from previous. No focal infiltrate, pulmonary edema, or pleural effusion. No pneumothorax. No acute osseus abnormality. IMPRESSION: 1. No active cardiopulmonary disease. 2. Elevation of the right hemidiaphragm, similar to prior, and likely chronic. Electronically Signed   By: Jeannine Boga M.D.   On: 03/04/2017 13:46      No results found for: HGBA1C Lab Results  Component Value Date   LDLCALC 114 (H) 11/06/2016   CREATININE 3.50 (H) 03/04/2017       Scheduled Meds: . amLODipine  5 mg Oral Daily  . aspirin EC  81 mg Oral Daily  . insulin aspart  0-5 Units Subcutaneous QHS  . insulin aspart  0-9 Units Subcutaneous TID WC  . metoprolol tartrate  25 mg Oral BID  . predniSONE  40 mg Oral Q breakfast   Followed by  . [START ON 03/13/2017]  predniSONE  30 mg Oral Q breakfast   Followed by  . [START ON 03/27/2017] predniSONE  20 mg Oral Q breakfast   Followed by  . [START ON 04/10/2017] predniSONE  10 mg Oral Q breakfast   Followed by  . [START ON 04/24/2017] predniSONE  5 mg Oral Q breakfast   Followed by  . [START ON 05/08/2017] predniSONE  5 mg Oral Q48H  . saccharomyces boulardii  250 mg Oral BID  . sodium chloride flush  3 mL Intravenous Q12H  . vitamin B-12  1,000 mcg Oral Daily  . Warfarin - Pharmacist Dosing Inpatient   Does not apply q1800   Continuous Infusions: . sodium chloride       LOS: 1 day    Time spent: >30 MINS    Reyne Dumas  [facility] Pager [phone]. If 7PM-7AM, please contact night-coverage at [email or web address], password [facility] 03/05/2017, 12:19 PM  LOS: 1 day

## 2017-03-05 NOTE — ED Notes (Signed)
MD ABROL AT BEDSIDE SPEAKING WITH PT AND FAMILY. MD DISCUSSING PLAN OF CARE. PT REQUESTING MEDICATIONS BE RESCHEDULE TO HER HOME CARE. PHARM TECH REQUESTED TO SPEAK WITH PT AND FAMILY TO ASSIST WITH THIS REQUEST.

## 2017-03-05 NOTE — ED Notes (Signed)
1305 report time. Lesly Rubenstein accepting RN [phone]

## 2017-03-05 NOTE — ED Notes (Signed)
BRIANNA RN MADE AWARE OF CARDIZEM DRIP STOPPED. AWARE OF MEDICATIONS ON HOLD AND NEED FOR GIVING MEDS IF PT WILL ALLOW. PT'S CONFUSION AT TIMES , POSSIBLE B-12 DEFECIT. MONITOR CLOSELY.

## 2017-03-05 NOTE — ED Notes (Signed)
BRIANNA RN UPDATED ON PT CURRENT STATUS

## 2017-03-05 NOTE — ED Notes (Signed)
PT DECLINES SECOND IV ACCESS. PT DECLINE ANY FURTHER BLOOD DRAW UNTIL SHE COMMUNICATES WITH DAUGHTER. FAMILY IS PRESENT AND AWARE OF MY COMMUNICATION WITH PT AND HONORING HER REQUEST AND RISK AND BENEFITS TO HER DECISION.

## 2017-03-05 NOTE — ED Notes (Signed)
Pt moved from stretcher to hospital bed

## 2017-03-06 DIAGNOSIS — I4891 Unspecified atrial fibrillation: Secondary | ICD-10-CM

## 2017-03-06 LAB — BASIC METABOLIC PANEL
ANION GAP: 9 (ref 5–15)
BUN: 69 mg/dL — ABNORMAL HIGH (ref 6–20)
CHLORIDE: 110 mmol/L (ref 101–111)
CO2: 23 mmol/L (ref 22–32)
Calcium: 8.3 mg/dL — ABNORMAL LOW (ref 8.9–10.3)
Creatinine, Ser: 3.28 mg/dL — ABNORMAL HIGH (ref 0.44–1.00)
GFR, EST AFRICAN AMERICAN: 15 mL/min — AB (ref >60)
GFR, EST NON AFRICAN AMERICAN: 13 mL/min — AB (ref >60)
Glucose, Bld: 124 mg/dL — ABNORMAL HIGH (ref 65–99)
POTASSIUM: 3.8 mmol/L (ref 3.5–5.1)
SODIUM: 142 mmol/L (ref 135–145)

## 2017-03-06 LAB — URINALYSIS, ROUTINE W REFLEX MICROSCOPIC
BACTERIA UA: NONE SEEN
BILIRUBIN URINE: NEGATIVE
Glucose, UA: 50 mg/dL — AB
KETONES UR: NEGATIVE mg/dL
Nitrite: NEGATIVE
Protein, ur: 100 mg/dL — AB
Specific Gravity, Urine: 1.01 (ref 1.005–1.030)
pH: 5 (ref 5.0–8.0)

## 2017-03-06 LAB — TROPONIN I: TROPONIN I: 0.05 ng/mL — AB (ref <0.03)

## 2017-03-06 LAB — GLUCOSE, CAPILLARY
GLUCOSE-CAPILLARY: 122 mg/dL — AB (ref 65–99)
GLUCOSE-CAPILLARY: 143 mg/dL — AB (ref 65–99)
GLUCOSE-CAPILLARY: 195 mg/dL — AB (ref 65–99)
GLUCOSE-CAPILLARY: 195 mg/dL — AB (ref 65–99)

## 2017-03-06 LAB — PROTIME-INR
INR: 2.53
Prothrombin Time: 27 seconds — ABNORMAL HIGH (ref 11.4–15.2)

## 2017-03-06 LAB — HEMOGLOBIN A1C
HEMOGLOBIN A1C: 5.8 % — AB (ref 4.8–5.6)
Mean Plasma Glucose: 119.76 mg/dL

## 2017-03-06 LAB — MAGNESIUM: MAGNESIUM: 1.6 mg/dL — AB (ref 1.7–2.4)

## 2017-03-06 MED ORDER — WARFARIN SODIUM 2.5 MG PO TABS
1.2500 mg | ORAL_TABLET | Freq: Once | ORAL | Status: AC
  Administered 2017-03-06: 1.25 mg via ORAL
  Filled 2017-03-06: qty 0.5

## 2017-03-06 MED ORDER — MAGNESIUM SULFATE 2 GM/50ML IV SOLN
2.0000 g | Freq: Once | INTRAVENOUS | Status: AC
Start: 2017-03-06 — End: 2017-03-06
  Administered 2017-03-06: 2 g via INTRAVENOUS
  Filled 2017-03-06: qty 50

## 2017-03-06 MED ORDER — METOPROLOL TARTRATE 25 MG PO TABS: 25.0000 mg | ORAL_TABLET | Freq: Every day | ORAL | Status: DC

## 2017-03-06 MED ORDER — FAMOTIDINE 20 MG PO TABS
20.0000 mg | ORAL_TABLET | Freq: Every day | ORAL | Status: DC
  Administered 2017-03-07: 20 mg via ORAL
  Filled 2017-03-06 (×4): qty 1

## 2017-03-06 MED ORDER — METOPROLOL TARTRATE 50 MG PO TABS
50.0000 mg | ORAL_TABLET | Freq: Every day | ORAL | Status: DC
  Administered 2017-03-07: 50 mg via ORAL
  Filled 2017-03-06: qty 1

## 2017-03-06 MED ORDER — METOPROLOL TARTRATE 25 MG PO TABS
25.0000 mg | ORAL_TABLET | ORAL | Status: DC
  Administered 2017-03-06 (×2): 25 mg via ORAL
  Filled 2017-03-06 (×2): qty 1

## 2017-03-06 MED ORDER — BOOST / RESOURCE BREEZE PO LIQD: 1.0000 | ORAL | Status: DC

## 2017-03-06 MED ORDER — CEFTRIAXONE SODIUM 1 G IJ SOLR
1.0000 g | INTRAMUSCULAR | Status: DC
  Filled 2017-03-06: qty 10

## 2017-03-06 MED ORDER — DILTIAZEM HCL 100 MG IV SOLR
5.0000 mg/h | INTRAVENOUS | Status: DC
  Filled 2017-03-06: qty 100

## 2017-03-06 MED ORDER — METOPROLOL TARTRATE 25 MG PO TABS
25.0000 mg | ORAL_TABLET | Freq: Once | ORAL | Status: AC
  Administered 2017-03-06: 25 mg via ORAL
  Filled 2017-03-06: qty 1

## 2017-03-06 MED ORDER — NEPRO/CARBSTEADY PO LIQD
237.0000 mL | ORAL | Status: DC
  Filled 2017-03-06: qty 237

## 2017-03-06 MED ORDER — POTASSIUM CHLORIDE CRYS ER 20 MEQ PO TBCR: 40.0000 meq | EXTENDED_RELEASE_TABLET | Freq: Once | ORAL | Status: DC

## 2017-03-06 MED ORDER — DILTIAZEM LOAD VIA INFUSION: 10.0000 mg | Freq: Once | INTRAVENOUS | Status: DC

## 2017-03-06 MED ORDER — UNJURY CHICKEN SOUP POWDER
8.0000 [oz_av] | Freq: Two times a day (BID) | ORAL | Status: DC
  Filled 2017-03-06 (×11): qty 27

## 2017-03-06 MED ORDER — DILTIAZEM HCL 25 MG/5ML IV SOLN
10.0000 mg | Freq: Once | INTRAVENOUS | Status: AC
  Administered 2017-03-06: 10 mg via INTRAVENOUS
  Filled 2017-03-06: qty 5

## 2017-03-06 NOTE — Progress Notes (Signed)
CRITICAL VALUE ALERT  Critical Value:  Troponin of .05  Date & Time Notied:  8/31 at 1125  Provider Notified: Allyson Sabal  Orders Received/Actions taken:

## 2017-03-06 NOTE — Progress Notes (Addendum)
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 Patient was sitting in the commode and fell to the floor due to weakness in her legs. Patient was just discharged 8/27 after workup for near syncope, thought to be secondary to dehydration. Patient had been evaluated by physical therapy and was cleared for discharge.  Assessment and plan  Atrial fibrillation with RVR ([place]): Heart rate was in the 180s, improved after receiving IV Cardizem Cardiology consulted and did not feel the need for IV Cardizem drip, increased a.m. dose of beta blocker. Continue Coumadin, INR is therapeutic Troponin negative recent admission, now 0.06>0.05, asymptomatic  We'll monitor K is 3.8 - Mg 1.6 , repleted TSH WNL on 01/2017 May need EP consult   Nausea Recent CT July 2018 was negative, patient denies constipation Initially thought to be related to cyclophosphamide, now switched over to rituximab given 8/25    UTI refused Rocephin and follow urine culture, if positive then treat, patient refused abx due to hx of  cdiff and colitis   EssentialHypertension Continue current home regimen.  IPF (idiopathic pulmonary fibrosis) (HCC) Continue steroid  saturations are stable.  Glomerulonephritis Continue steroids. Rituximab. Followed by Genia Del, daughter called nephrology office to find out if she will get her rituximab next week, discussed  with Dr Posey Pronto , nephrology will call daughter about next dose and when it is due  Baseline creatinine around 3.5, creatinine at baseline today 8.93  Chronic diastolic CHF (congestive heart failure) ([place]) Seems to be euvolemic, continue current medications.  Diabetes mellitus with complication (HCC) Continue sliding scale insulin, hemoglobin A1c 5.8  Elevation of lactic acid: There is likely due to hypoperfusion due to RVR she has remained afebrile with no leukocytosis is unlikely to sepsis.  Fall Will need PT OT evaluation, found to have very low B-12 which could be contributing to peripheral neuropathy Will continue B-12 injections IM daily during this hospitalization   DVT prophylaxsis Coumadin  Code Status:  Full code   Family Communication: Discussed in detail with the patient, all imaging results, lab results explained to the patient   Disposition Plan:  1-2 days        Consultants:  Cardiology  Procedures:  None  Antibiotics: Anti-infectives    None         HPI/Subjective: Noted to have a heart rate in the 180s this morning, improved after receiving iv cardizem Objective: Vitals:   03/06/17 0415 03/06/17 0452 03/06/17 0958 03/06/17 1047  BP: 119/71 (!) 135/51 123/69 127/65  Pulse: (!) 57 100  87  Resp: 16 16    Temp: 98.3 F (36.8 C)     TempSrc: Oral     SpO2: 96% 96%    Weight:      Height:        Intake/Output Summary (Last  24 hours) at 03/06/17 1206 Last data filed at 03/06/17 0200  Gross per 24 hour  Intake              180 ml  Output              800 ml  Net             -620 ml    Exam:  Examination:  General exam: Appears calm and comfortable  Respiratory system: Clear to auscultation. Respiratory effort normal. Cardiovascular  system:Irregularly irregular, RRR. No JVD, murmurs, rubs, gallops or clicks. No pedal edema. Gastrointestinal system: Abdomen is nondistended, soft and nontender. No organomegaly or masses felt. Normal bowel sounds heard. Central nervous system: Alert and oriented. No focal neurological deficits. Extremities: Symmetric 5 x 5 power. Skin: No rashes, lesions or ulcers Psychiatry: Judgement and insight appear normal. Mood & affect appropriate.     Data Reviewed: I have personally reviewed following labs and imaging studies  Micro Results Recent Results (from the past 240 hour(s))  Urine culture     Status: Abnormal   Collection Time: 02/26/17  3:25 PM  Result Value Ref Range Status   Specimen Description URINE, RANDOM  Final   Special Requests NONE  Final   Culture <10,000 COLONIES/mL (A)  Final   Report Status 02/28/2017 FINAL  Final    Radiology Reports Dg Chest 2 View  Result Date: 03/04/2017 CLINICAL DATA:  Initial evaluation for acute weakness. EXAM: CHEST  2 VIEW COMPARISON:  Prior radiograph from 02/26/2017. FINDINGS: Cardiac and mediastinal silhouettes are stable in size and contour, and remain within normal limits. Elevation of the right hemidiaphragm, stable from previous. No focal infiltrate, pulmonary edema, or pleural effusion. No pneumothorax. No acute osseus abnormality. IMPRESSION: 1. No active cardiopulmonary disease. 2. Elevation of the right hemidiaphragm, similar to prior, and likely chronic. Electronically Signed   By: Jeannine Boga M.D.   On: 03/04/2017 13:46   Dg Chest 2 View  Result Date: 02/26/2017 CLINICAL DATA:  Chronic renal failure. The patient has been lethargic. EXAM: CHEST  2 VIEW COMPARISON:  January 16, 2017 FINDINGS: Elevation of the right hemidiaphragm persists. The heart, hila, mediastinum, lungs, and pleura are otherwise normal. No acute interval changes. IMPRESSION: No active cardiopulmonary disease. Electronically Signed   By: Dorise Bullion III  M.D   On: 02/26/2017 12:39     CBC  Recent Labs Lab 02/27/17 1940 02/28/17 0424 03/01/17 0519 03/02/17 0419 03/04/17 1313 03/04/17 1325 03/05/17 1049  WBC 8.7 9.2 7.2  --  10.2  --  9.9  HGB 8.5* 8.1* 8.2* 8.6* 8.9* 8.5* 8.1*  HCT 26.1* 24.3* 25.5* 26.3* 25.0* 25.0* 23.6*  PLT 224 224 195  --  250  --  209  MCV 88.8 87.7 90.4  --  93.3  --  92.2  MCH 28.9 29.2 29.1  --  33.2  --  31.6  MCHC 32.6 33.3 32.2  --  35.6  --  34.3  RDW 16.3* 16.7* 17.3*  --  17.6*  --  17.5*  LYMPHSABS  --   --   --   --  0.8  --   --   MONOABS  --   --   --   --  0.0*  --   --   EOSABS  --   --   --   --  0.0  --   --   BASOSABS  --   --   --   --  0.0  --   --     Chemistries   Recent Labs Lab 02/28/17 0424 03/01/17 0519 03/02/17 0419 03/04/17 1313 03/04/17 1325 03/06/17 0441 03/06/17 0951  NA 137 140 139 139 140 142  --   K 4.3 4.3 4.3 3.9 3.9 3.8  --   CL 108 110 110 107 107 110  --   CO2 21* 22 22 20*  --  23  --   GLUCOSE 112* 130* 140* 133* 129* 124*  --   BUN 79* 69* 68* 72* 69* 69*  --   CREATININE 3.69* 3.53* 3.53* 3.50* 3.50* 3.28*  --   CALCIUM 8.2* 7.9* 8.3* 8.6*  --  8.3*  --   MG  --   --   --   --   --   --  1.6*  AST  --   --   --  27  --   --   --   ALT  --   --   --  54  --   --   --   ALKPHOS  --   --   --  62  --   --   --   BILITOT  --   --   --  1.3*  --   --   --    ------------------------------------------------------------------------------------------------------------------ estimated creatinine clearance is 16.5 mL/min (A) (by C-G formula based on SCr of 3.28 mg/dL (H)). ------------------------------------------------------------------------------------------------------------------  Recent Labs  03/06/17 0441  HGBA1C 5.8*   ------------------------------------------------------------------------------------------------------------------ No results for input(s): CHOL, HDL, LDLCALC, TRIG, CHOLHDL, LDLDIRECT in the last 72  hours. ------------------------------------------------------------------------------------------------------------------ No results for input(s): TSH, T4TOTAL, T3FREE, THYROIDAB in the last 72 hours.  Invalid input(s): FREET3 ------------------------------------------------------------------------------------------------------------------ No results for input(s): VITAMINB12, FOLATE, FERRITIN, TIBC, IRON, RETICCTPCT in the last 72 hours.  Coagulation profile  Recent Labs Lab 03/01/17 0519 03/02/17 0419 03/04/17 1313 03/05/17 0635 03/06/17 0441  INR 3.77 3.12 2.25 2.44 2.53    No results for input(s): DDIMER in the last 72 hours.  Cardiac Enzymes  Recent Labs Lab 03/06/17 0951  TROPONINI 0.05*   ------------------------------------------------------------------------------------------------------------------ Invalid input(s): POCBNP   CBG:  Recent Labs Lab 03/05/17 1237 03/05/17 1706 03/05/17 2023 03/06/17 0751 03/06/17 1153  GLUCAP 167* 177* 200* 122* 143*       Studies: Dg Chest 2 View  Result Date: 03/04/2017 CLINICAL DATA:  Initial evaluation for acute weakness. EXAM: CHEST  2 VIEW COMPARISON:  Prior radiograph from 02/26/2017. FINDINGS: Cardiac and mediastinal silhouettes are stable in size and contour, and remain within normal limits. Elevation of the right hemidiaphragm, stable from previous. No focal infiltrate, pulmonary edema, or pleural effusion. No pneumothorax. No acute osseus abnormality. IMPRESSION: 1. No active cardiopulmonary disease. 2. Elevation of the right hemidiaphragm, similar to prior, and likely chronic. Electronically Signed   By: Jeannine Boga M.D.   On: 03/04/2017 13:46      Lab Results  Component Value Date   HGBA1C 5.8 (H) 03/06/2017   Lab Results  Component Value Date   LDLCALC 114 (H) 11/06/2016   CREATININE 3.28 (H) 03/06/2017       Scheduled Meds: . amLODipine  5 mg Oral Daily  . aspirin EC  81 mg Oral Daily   . cyanocobalamin  1,000 mcg Intramuscular q morning - 10a  . famotidine  20 mg Oral BID  . insulin aspart  0-5 Units Subcutaneous QHS  . insulin aspart  0-9 Units Subcutaneous TID WC  . metoprolol tartrate  25 mg  Oral 2 times per day  . [START ON 03/07/2017] metoprolol tartrate  50 mg Oral Daily  . predniSONE  40 mg Oral Q breakfast   Followed by  . [START ON 03/13/2017] predniSONE  30 mg Oral Q breakfast   Followed by  . [START ON 03/27/2017] predniSONE  20 mg Oral Q breakfast   Followed by  . [START ON 04/10/2017] predniSONE  10 mg Oral Q breakfast   Followed by  . [START ON 04/24/2017] predniSONE  5 mg Oral Q breakfast   Followed by  . [START ON 05/08/2017] predniSONE  5 mg Oral Q48H  . saccharomyces boulardii  250 mg Oral BID  . sodium chloride flush  3 mL Intravenous Q12H  . warfarin  1.25 mg Oral ONCE-1800  . Warfarin - Pharmacist Dosing Inpatient   Does not apply q1800   Continuous Infusions: . sodium chloride       LOS: 2 days    Time spent: >30 MINS    Reyne Dumas  [facility] Pager [phone]. If 7PM-7AM, please contact night-coverage at [email or web address], password [facility] 03/06/2017, 12:06 PM  LOS: 2 days

## 2017-03-06 NOTE — Progress Notes (Signed)
[place] for warfarin Indication: atrial fibrillation  Allergies  Allergen Reactions  . Brilinta [Ticagrelor] Shortness Of Breath, Other (See Comments) and Cough    Reaction:  Fatigue  . Lisinopril Shortness Of Breath and Cough  . Statins Other (See Comments)    Reaction:  Muscle pain/weakness  . Zetia [Ezetimibe] Other (See Comments)    Reaction:  Muscle pain/weakness   . Macrodantin [Nitrofurantoin Macrocrystal] Rash  . Sulfa Antibiotics Rash    Patient Measurements: Height: 5\' 7"  (170.2 cm) Weight: 179 lb 3.7 oz (81.3 kg) IBW/kg (Calculated) : 61.6  Vital Signs: Temp: 98.3 F (36.8 C) (08/31 0415) Temp Source: Oral (08/31 0415) BP: 135/51 (08/31 0452) Pulse Rate: 100 (08/31 0452)  Labs:  Recent Labs  03/04/17 1313 03/04/17 1325 03/05/17 0635 03/05/17 1049 03/05/17 1427 03/06/17 0441  HGB 8.9* 8.5*  --  8.1*  --   --   HCT 25.0* 25.0*  --  23.6*  --   --   PLT 250  --   --  209  --   --   LABPROT 24.7*  --  26.3*  --   --  27.0*  INR 2.25  --  2.44  --   --  2.53  CREATININE 3.50* 3.50*  --   --   --  3.28*  CKTOTAL  --   --   --   --  49  --     Estimated Creatinine Clearance: 16.5 mL/min (A) (by C-G formula based on SCr of 3.28 mg/dL (H)).    Assessment: 75 YOF presents with weakness d/t atrial fibrillation with RVR.  History of atrial fibrillation on warfarin therapy.  INR therapeutic at time of admission. INR noted to be supratherapeutic on 8/20 at warfarin clinic (held warfarin x 2 days and resumed at regimen below)  Home warfarin: 1.25mg  MWF and 2.5mg  TTSS (last dose 8/28)  Today, 03/06/2017  INR remains therapeutic  CBC: Hgb decreased but consistent with previous values, pltc WNL  No major drug interactions. On ASA 81mg  for CAD w/ stents (2013)  Goal of Therapy:  INR 2-3   Plan:   Warfarin 1.25mg  x 1 today  Daily INR, CBC at least weekly  Monitor for signs of bleeding or thrombosis  Dolly Rias RPh 03/06/2017, 9:50 AM Pager [phone]

## 2017-03-06 NOTE — Progress Notes (Signed)
Initial Nutrition Assessment  DOCUMENTATION CODES:   Severe malnutrition in context of acute illness/injury  INTERVENTION:  - Will order Unjury Chicken Soup BID, each 8 ounce serving provides 100 kcal and 21 grams of protein. - Continue to encourage PO intakes of meals and supplements. - Will complete nutrition-focused physical assessment at follow-up.   NUTRITION DIAGNOSIS:   Malnutrition (severe) related to acute illness (weakness, recent admission with N/V and associated decreased intakes) as evidenced by percent weight loss, moderate depletions of muscle mass.  GOAL:   Patient will meet greater than or equal to 90% of their needs  MONITOR:   PO intake, Supplement acceptance, Weight trends, Labs, I & O's  REASON FOR ASSESSMENT:   Malnutrition Screening Tool  ASSESSMENT:   76 y.o. 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 Patient was sitting in the commode and fell to the floor due to weakness in her legs. Patient was just discharged 8/27 after workup for near syncope, thought to be secondary to dehydration.   Pt seen for MST. BMI indicates overweight status. No intakes documented since admission. Unable to talk with pt x2 attempts (first attempt RN was going to clean pt up and second attempt RN talking in depth with family member, family member left pt's room, and RN stated to let pt rest). Unable to obtain PTA information or perform physical assessment at this time. Per chart review, pt has lost 125 lbs (6% body weight) in the past 1 month; this is significant for time frame.   Pt was seen by another RD during last admission (seen 8/24  by RD). Information from that visit outlined below:  Family reports tracking pt's meals over the past weeks.   Per report, pt has only been consuming 500-600 kcal per day.   Per chart review, pt meal completion is 25% during current admission.  Family report pt's UBW is 220-230 lb, stating she weighed this in 07/2016.   Pt has experienced a downward trend in weight over the past couple months.   Per weight history, pt has 9% wt loss in 1 month, significant for time frame.   Intermittent nausea since July and overall weakness.   The nausea has steadly increased over the past 2 weeks and has impacted PO intake.  Pt reports disliking all Boost and other nutritional supplement products. However, pt amenable to Unjury chicken soup flavor as pt states she enjoys soup.   Nutrition focused physical exam completed. Findings include no fat depletion, mild muscle depletion and no edema.    Medications reviewed; 1000 mcg intramuscular vitamin B12/day, 20 mg oral Pepcid/day, sliding scale Novolog, 2 g IV Mg sulfate x1 run today, 40 mEq oral KCl x1 dose today, 40 mg Deltasone/day x8 days starting 8/30 (steroid taper), 250 mg Florastor BID. Labs reviewed; CBGs: 122 and 143 mg/dL today, BUN: 69 mg/dL, creatinine: 3.28 mg/dL, Ca: 8.3 mg/dL, Mg: 1.6 mg/dL, GFR: 13 mL/min.   IVF: NS @ 50 mL/hr.    Diet Order:  Diet renal with fluid restriction Fluid restriction: 1200 mL Fluid; Room service appropriate? Yes; Fluid consistency: Thin  Skin:  Reviewed, no issues  Last BM:  8/29  Height:   Ht Readings from Last 1 Encounters:  03/05/17 5\' 7"  (1.702 m)    Weight:   Wt Readings from Last 1 Encounters:  03/05/17 179 lb 3.7 oz (81.3 kg)    Ideal Body Weight:  61.36 kg  BMI:  Body mass index is 28.07 kg/m.  Estimated Nutritional Needs:   Kcal:  1630-1870 (20-23 kcal/kg)  Protein:  65-80 grams (0.8-1 grams/kg)  Fluid:  1.2-1.5 L/day  EDUCATION NEEDS:   No education needs identified at  this time    Jarome Matin, MS, RD, LDN, CNSC Inpatient Clinical Dietitian Pager # [phone] After hours/weekend pager # [phone]

## 2017-03-06 NOTE — Progress Notes (Signed)
Curbside with EP today. Pt is not a candidate for any other antiarrhythmic, given her renal function, structural heart disease, and pulmonary fibrosis. They will round this weekend.  Continue with PO lopressor.   Ledora Bottcher, PA-C 03/06/2017, 12:32 PM [phone] [facility]

## 2017-03-06 NOTE — Progress Notes (Signed)
Patient in Afib with HR 180's at times nonsustained. Patient asymptomatic and all other VS stable. I did notify MD on call of update. Will continue to monitor patient closely.

## 2017-03-06 NOTE — Consult Note (Signed)
Cardiology Consultation:   Patient ID: [name]; [id number]; 1941/02/24   Admit date: 03/04/2017 Date of Consult: 03/06/2017  Primary Care Provider: Burnard Bunting, MD Primary Cardiologist: Dr. Martinique Primary Electrophysiologist:  Dr. Lovena Le   Patient Profile:   Jody Ruiz is a 76 y.o. female with a hx of CAD, HLD, HTN, idiopathic pulmonary fibrosis, OSA on CPAP, CKD stage 3, chronic diastolic heart failure, and PAF. She has a history of NSTEMI 04/2012 s/p DES x 2 to mid LAD. She is being seen today for the evaluation of Afib RVR at the request of Dr. Allyson Sabal.  History of Present Illness:   Jody Ruiz has a history of CAD s/p NSTEMI with DES x 2 to mid LAD, multiple repeat catheterizations with patent stents. 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 She last saw Jody Deforest PA-C in clinic on 02/05/17. At that time, she was in sinus bradycardia with PVCs. She was aware of episodes of Afib. It was thought that she is experiencing paroxysmal Afib at home. She was instructed to take extra lopressor when needed. She is on coumadin given her CKD.  She was recently seen in the ED for a syncopal episode that happened in nephrologist's clinic. Pt LOC but did not sustain head injury. This episode was thought to be secondary to dehydration given poor PO intake. She was re-hydrated and discharged home.    She was re-admitted to the ED on 03/04/17 for generalized weakness. She woke up 03/04/17 and felt weak and nauseous. She went to the bathroom and slipped and fell to the floor. On arrival to [facility], she was noted to be in Afib RVR in the 130s. She was started on cardizem drip, which rate controlled her Afib in the 80-90s. Home lopressor was resumed. She denies chest pain and SOB.   Past Medical History:  Diagnosis Date  . Arthritis   . Atrial fibrillation ([place])   . Borderline  hypothyroidism   . CAD (coronary artery disease)    a. 04/2012 NSTEMI/Cath:  pLAD 90%, mLAD 70-80% (long) (3x16 & 2.5x28 Promus DES to p/m LAD), pD1 70-80%, pRI 30% (small), CFX 20%, OM1 40%, mOM2 30%, pRCA 30%, PDA 30-40%, pPLB 70%, then mid 90%, EF 65%;  b. 03/2013 Abnl CL w/apical isch;  c. 04/2013 Cath: LM nl, LAD patent stents, LCX <20, RCA 30p PDA 40-50 EF 55-60% ->Cont Med Rx.  c. CP s/p LHC with stable dz and patent stents--> Rx medically   . Cataract   . Dyspnea    a. CP and SOB 06/2012 => Ticagrelor changed to Plavix  . History of Clostridium difficile infection   . HLD (hyperlipidemia)    intolerant to statins  . Hx of adenomatous colonic polyps   . Hx of echocardiogram    a. Echo 2/14:  mild LVH, EF 55-65%, Gr 1 diast dysfn, mild LAE  . Hypertension   . Idiopathic pulmonary fibrosis   . Internal hemorrhoids   . Lung nodules   . Microscopic colitis   . Myocardial infarction ([place]) 2013  . Obesity   . OSA (obstructive sleep apnea)    a. on CPAP  . Sleep apnea    cpap    Past Surgical History:  Procedure Laterality Date  . APPENDECTOMY  1994  . BREAST BIOPSY  1965, 1975   bilateral  . CARDIAC CATHETERIZATION     x4  . CHOLECYSTECTOMY  2004  . CORONARY STENT PLACEMENT  04/2012   x2  . KNEE ARTHROSCOPY  2000   bilateral  . LEFT AND RIGHT HEART CATHETERIZATION WITH CORONARY ANGIOGRAM N/A 08/02/2014   Procedure: LEFT AND RIGHT HEART CATHETERIZATION WITH CORONARY ANGIOGRAM;  Surgeon: Burnell Blanks, MD;  Location: [facility] CATH LAB;  Service: Cardiovascular;  Laterality: N/A;  . LEFT HEART CATHETERIZATION WITH CORONARY ANGIOGRAM N/A 04/29/2012   Procedure: LEFT HEART CATHETERIZATION WITH CORONARY ANGIOGRAM;  Surgeon: Beauty Pless M Martinique, MD;  Location: [facility] CATH LAB;  Service: Cardiovascular;  Laterality: N/A;  . LEFT HEART CATHETERIZATION WITH CORONARY ANGIOGRAM N/A 08/03/2012   Procedure: LEFT HEART CATHETERIZATION WITH CORONARY ANGIOGRAM;  Surgeon: Burnell Blanks, MD;   Location: [facility] CATH LAB;  Service: Cardiovascular;  Laterality: N/A;  . LEFT HEART CATHETERIZATION WITH CORONARY ANGIOGRAM N/A 04/12/2013   Procedure: LEFT HEART CATHETERIZATION WITH CORONARY ANGIOGRAM;  Surgeon: Valincia Touch M Martinique, MD;  Location: [facility] CATH LAB;  Service: Cardiovascular;  Laterality: N/A;  . PERCUTANEOUS CORONARY STENT INTERVENTION (PCI-S) N/A 04/29/2012   Procedure: PERCUTANEOUS CORONARY STENT INTERVENTION (PCI-S);  Surgeon: Iola Turri M Martinique, MD;  Location: [facility] CATH LAB;  Service: Cardiovascular;  Laterality: N/A;  . SHOULDER ARTHROSCOPY  2012   left  . vaginal polypectomy  2004  . VENTRAL HERNIA REPAIR  2005     Home Medications:  Prior to Admission medications   Medication Sig Start Date End Date Taking? Authorizing Provider  amLODipine (NORVASC) 5 MG tablet Take 5 mg by mouth daily before breakfast.    Yes [provider]  aspirin EC 81 MG tablet Take 81 mg by mouth daily before breakfast.    Yes [provider]  dicyclomine (BENTYL) 10 MG capsule Take 1 capsule (10 mg total) by mouth 3 (three) times daily as needed for spasms. 01/21/17  Yes Regalado, Belkys A, MD  isosorbide mononitrate (IMDUR) 60 MG 24 hr tablet Take 60 mg by mouth at bedtime.   Yes [provider]  metoprolol tartrate (LOPRESSOR) 25 MG tablet Take 1 tablet (25 mg total) by mouth 3 (three) times daily. Patient taking differently: Take 25 mg by mouth 3 (three) times daily. Takes with breakfast, supper, and bedtime 03/02/17  Yes Eugenie Filler, MD  nitroGLYCERIN (NITROSTAT) 0.4 MG SL tablet Place 1 tablet (0.4 mg total) under the tongue every 5 (five) minutes x 3 doses as needed for chest pain. 08/03/14  Yes Eileen Stanford, PA-C  ondansetron (ZOFRAN) 4 MG tablet Take 1 tablet (4 mg total) by mouth every 8 (eight) hours as needed for nausea or vomiting. 01/08/17  Yes Irene Shipper, MD  polyvinyl alcohol (LIQUIFILM TEARS) 1.4 % ophthalmic solution Place 1 drop into both eyes 3 (three) times  daily as needed for dry eyes.   Yes [provider]  predniSONE (DELTASONE) 10 MG tablet Take 5-50 mg by mouth daily. 50mg  daily for 2 weeks, 40mg  for 2 weeks, 30mg  for 2 weeks, 20mg  for 2 weeks, 10mg  for 2 weeks, 5 mg for 2 weeks, then 5mg  everyother day for 2 weeks. Then stop. Start date was 02/17/17. [place] to confirm taper instructions. Start new taper on Tuesday's 02/13/17  Yes [provider]  RiTUXimab (RITUXAN IV) Inject into the vein. [employer]. Next Dose 03/10/17. Dr. Hollie Salk Prescibing Dr. Maryjane Hurter dose 02/28/17   Yes [provider]  vitamin B-12 (CYANOCOBALAMIN) 1000 MCG tablet Take 1 tablet (1,000 mcg total) by mouth daily. Patient taking differently: Take 1,000 mcg by  mouth daily with lunch.  03/02/17  Yes Eugenie Filler, MD  warfarin (COUMADIN) 2.5 MG tablet Take 0.5-1 tablets (1.25-2.5 mg total) by mouth See admin instructions. Take 1.25mg  (1/2 tablet) by mouth on Monday, Wednesday, and Friday.  Take 1 tablet (2.5mg ) by mouth on Tuesday, Thursday, Saturday, and sunday Patient taking differently: Take 1.25-2.5 mg by mouth See admin instructions. 1.25 mg on Monday, Wednesday and Friday. 2.5 mg on Tuesday, Thursday, Saturday and Sunday. 03/02/17  Yes Eugenie Filler, MD  famotidine (PEPCID) 20 MG tablet Take 1 tablet (20 mg total) by mouth 2 (two) times daily. Patient not taking: Reported on 03/04/2017 01/21/17   Niel Hummer A, MD  protein supplement (UNJURY CHICKEN SOUP) POWD Take 7 g (2 oz total) by mouth 2 (two) times daily with a meal. Patient not taking: Reported on 03/04/2017 03/02/17   Eugenie Filler, MD  saccharomyces boulardii (FLORASTOR) 250 MG capsule Take 1 capsule (250 mg total) by mouth 2 (two) times daily. Patient not taking: Reported on 03/04/2017 01/21/17   Niel Hummer A, MD    Inpatient Medications: Scheduled Meds: . amLODipine  5 mg Oral Daily  . aspirin EC  81 mg Oral Daily  . cyanocobalamin  1,000  mcg Intramuscular q morning - 10a  . famotidine  20 mg Oral BID  . insulin aspart  0-5 Units Subcutaneous QHS  . insulin aspart  0-9 Units Subcutaneous TID WC  . metoprolol tartrate  25 mg Oral TID  . predniSONE  40 mg Oral Q breakfast   Followed by  . [START ON 03/13/2017] predniSONE  30 mg Oral Q breakfast   Followed by  . [START ON 03/27/2017] predniSONE  20 mg Oral Q breakfast   Followed by  . [START ON 04/10/2017] predniSONE  10 mg Oral Q breakfast   Followed by  . [START ON 04/24/2017] predniSONE  5 mg Oral Q breakfast   Followed by  . [START ON 05/08/2017] predniSONE  5 mg Oral Q48H  . saccharomyces boulardii  250 mg Oral BID  . sodium chloride flush  3 mL Intravenous Q12H  . Warfarin - Pharmacist Dosing Inpatient   Does not apply q1800   Continuous Infusions: . sodium chloride     PRN Meds: sodium chloride, acetaminophen **OR** acetaminophen, dicyclomine, nitroGLYCERIN, [DISCONTINUED] ondansetron **OR** ondansetron (ZOFRAN) IV, ondansetron, polyethylene glycol, polyvinyl alcohol, sodium chloride flush, traMADol  Allergies:    Allergies  Allergen Reactions  . Brilinta [Ticagrelor] Shortness Of Breath, Other (See Comments) and Cough    Reaction:  Fatigue  . Lisinopril Shortness Of Breath and Cough  . Statins Other (See Comments)    Reaction:  Muscle pain/weakness  . Zetia [Ezetimibe] Other (See Comments)    Reaction:  Muscle pain/weakness   . Macrodantin [Nitrofurantoin Macrocrystal] Rash  . Sulfa Antibiotics Rash    Social History:   Social History   Social History  . Marital status: Married    Spouse name: N/A  . Number of children: 3  . Years of education: N/A   Occupational History  . Retired Therapist, sports Retired   Social History Main Topics  . Smoking status: Never Smoker  . Smokeless tobacco: Never Used     Comment: second hand smoke   . Alcohol use No  . Drug use: No  . Sexual activity: No   Other Topics Concern  . Not on file   Social History Narrative    . No narrative on file    Family History:  Family History  Problem Relation Age of Onset  . Breast cancer Sister   . Prostate cancer Father   . Colon polyps Father   . Heart disease Father 73       CAD, died of ESRD  . Kidney disease Father   . Colon cancer Sister 46  . Heart disease Mother 59       Died acute MI  . Heart disease Brother 64       CABG  . Esophageal cancer Neg Hx   . Rectal cancer Neg Hx   . Stomach cancer Neg Hx      ROS:  Please see the history of present illness.  ROS  All other ROS reviewed and negative.     Physical Exam/Data:   Vitals:   03/05/17 1423 03/05/17 2010 03/06/17 0415 03/06/17 0452  BP: (!) 157/87 114/74 119/71 (!) 135/51  Pulse: (!) 119 (!) 102 (!) 57 100  Resp: 18 16 16 16   Temp: 98.7 F (37.1 C) 98.2 F (36.8 C) 98.3 F (36.8 C)   TempSrc: Oral Oral Oral   SpO2: 97% 96% 96% 96%  Weight: 179 lb 3.7 oz (81.3 kg)     Height: 5\' 7"  (1.702 m)       Intake/Output Summary (Last 24 hours) at 03/06/17 0944 Last data filed at 03/06/17 0200  Gross per 24 hour  Intake              180 ml  Output             1200 ml  Net            -1020 ml   Filed Weights   03/05/17 0842 03/05/17 1423  Weight: 185 lb (83.9 kg) 179 lb 3.7 oz (81.3 kg)   Body mass index is 28.07 kg/m.  General:  Well nourished, well developed, in no acute distress, unwilling to participate in interview at first HEENT: normal Lymph: no adenopathy Neck: no JVD Endocrine:  No thryomegaly Vascular: No carotid bruits; FA pulses 2+ bilaterally without bruits  Cardiac:  Irregular rhythm, regular rate, no murmur Lungs:  clear to auscultation bilaterally, no wheezing, rhonchi or rales  Abd: soft, nontender, no hepatomegaly  Ext: no edema Musculoskeletal:  No deformities, BUE and BLE strength normal and equal Skin: warm and dry  Neuro:  CNs 2-12 intact, no focal abnormalities noted Psych:  Normal affect   EKG:  The EKG was personally reviewed and demonstrates:   Afib RVR, ventricular rate 138 Telemetry:  Telemetry was personally reviewed and demonstrates:  Atrial fibrillation vs atrial flutter, now rate controlled in the 80s  Relevant CV Studies:  Echo 01/19/17: Study Conclusions - Left ventricle: The cavity size was normal. Systolic function was   normal. The estimated ejection fraction was in the range of 60%   to 65%. Wall motion was normal; there were no regional wall   motion abnormalities. There was an increased relative   contribution of atrial contraction to ventricular filling.   Doppler parameters are consistent with abnormal left ventricular   relaxation (grade 1 diastolic dysfunction). - Mitral valve: Calcified annulus. There was mild regurgitation. - Pulmonary arteries: Systolic pressure could not be accurately   estimated.   NM stress test 03/21/16:  The left ventricular ejection fraction is normal (55-65%).  Nuclear stress EF: 62%.  Blood pressure demonstrated a hypertensive response to exercise.  T wave inversion was noted during stress in the II, aVF, V4, V5, V6, V3 and V2  leads.  The study is normal.  This is a low risk study.   Cardiac cath 08/02/14: Impression: 1. Double vessel CAD with patent stents LAD 2. Moderate stenosis in small caliber posterolateral branch that does not appear to be flow limiting.  3. Normal LV systolic function 4. Non-cardiac chest pain 5. Normal filling pressures  Recommendations: Continue medical management of CAD. I do not think I can explain her chest pain based on the degree of CAD.    Laboratory Data:  Chemistry Recent Labs Lab 03/02/17 0419 03/04/17 1313 03/04/17 1325 03/06/17 0441  NA 139 139 140 142  K 4.3 3.9 3.9 3.8  CL 110 107 107 110  CO2 22 20*  --  23  GLUCOSE 140* 133* 129* 124*  BUN 68* 72* 69* 69*  CREATININE 3.53* 3.50* 3.50* 3.28*  CALCIUM 8.3* 8.6*  --  8.3*  GFRNONAA 12* 12*  --  13*  GFRAA 14* 14*  --  15*  ANIONGAP 7 12  --  9     Recent  Labs Lab 02/28/17 0424 03/02/17 0419 03/04/17 1313  PROT  --   --  5.5*  ALBUMIN 2.6* 2.7* 3.0*  AST  --   --  27  ALT  --   --  54  ALKPHOS  --   --  62  BILITOT  --   --  1.3*   Hematology Recent Labs Lab 03/01/17 0519  03/04/17 1313 03/04/17 1325 03/05/17 1049  WBC 7.2  --  10.2  --  9.9  RBC 2.82*  --  2.68*  --  2.56*  HGB 8.2*  < > 8.9* 8.5* 8.1*  HCT 25.5*  < > 25.0* 25.0* 23.6*  MCV 90.4  --  93.3  --  92.2  MCH 29.1  --  33.2  --  31.6  MCHC 32.2  --  35.6  --  34.3  RDW 17.3*  --  17.6*  --  17.5*  PLT 195  --  250  --  209  < > = values in this interval not displayed. Cardiac EnzymesNo results for input(s): TROPONINI in the last 168 hours.  Recent Labs Lab 03/04/17 1323  TROPIPOC 0.06    BNPNo results for input(s): BNP, PROBNP in the last 168 hours.  DDimer No results for input(s): DDIMER in the last 168 hours.  Radiology/Studies:  Dg Chest 2 View  Result Date: 03/04/2017 CLINICAL DATA:  Initial evaluation for acute weakness. EXAM: CHEST  2 VIEW COMPARISON:  Prior radiograph from 02/26/2017. FINDINGS: Cardiac and mediastinal silhouettes are stable in size and contour, and remain within normal limits. Elevation of the right hemidiaphragm, stable from previous. No focal infiltrate, pulmonary edema, or pleural effusion. No pneumothorax. No acute osseus abnormality. IMPRESSION: 1. No active cardiopulmonary disease. 2. Elevation of the right hemidiaphragm, similar to prior, and likely chronic. Electronically Signed   By: Jeannine Boga M.D.   On: 03/04/2017 13:46    Assessment and Plan:   1. Atrial fibrillation with RVR - recent diagnosis of Afib on lopressor and coumadin at home - recent history of poor PO intake - RVR may be related to dehydration - discontinued cardizem drip, she is now rate controlled in the 80-90s - home lopressor of 25 mg TID was increased to 50 mg in the AM and 25 mg at 2pm and 25 mg at 8pm - continue coumadin, INR has been  therapeutic - will contact EP for recommendations for antiarrhythmic. Amiodarone would not be a  good option for her given her pulmonary fibrosis - This patients CHA2DS2-VASc Score and unadjusted Ischemic Stroke Rate (% per year) is equal to 4.8 % stroke rate/year from a score of 4 (HTN, CAD, female, age). Continue coumadin - K is 3.8 - Mg pending, TSH WNL on 01/2017   2. CAD s/p DES x 2 to mid LAD - recent cath with patent stents, recent NM stress low risk - she denies chest pain   3. Chronic diastolic heart failure - would avoid diuretics at this time given her dehydration - she is not volume overloaded on exam   4. Pulmonary fibrosis - per primary team - no supplemental oxygen    Signed, Ledora Bottcher, [place]  03/06/2017 9:44 AM  Patient examined chart reviewed discussed care with patient , daughter and PA. Exam with elderly white female clear lungs no murmur no edema. Admitted with PAF Was in SB last office visit Given iv cardizem bolus and rate improved On Rx coumadin. Significant ILD and renal failure limit choices for AAT. Will ask Dr Lovena Le her primary and EP any advice on this vs continued rate control and anticoagulation No DOAC due to renal failure continue coumadin No indication for "emergent" cardioversion as afib is essentially asymptomatic  [name]

## 2017-03-07 DIAGNOSIS — R55 Syncope and collapse: Secondary | ICD-10-CM

## 2017-03-07 DIAGNOSIS — I480 Paroxysmal atrial fibrillation: Principal | ICD-10-CM

## 2017-03-07 DIAGNOSIS — F0631 Mood disorder due to known physiological condition with depressive features: Secondary | ICD-10-CM | POA: Diagnosis present

## 2017-03-07 DIAGNOSIS — R45851 Suicidal ideations: Secondary | ICD-10-CM

## 2017-03-07 DIAGNOSIS — I10 Essential (primary) hypertension: Secondary | ICD-10-CM

## 2017-03-07 DIAGNOSIS — E860 Dehydration: Secondary | ICD-10-CM

## 2017-03-07 DIAGNOSIS — F4323 Adjustment disorder with mixed anxiety and depressed mood: Secondary | ICD-10-CM | POA: Diagnosis present

## 2017-03-07 DIAGNOSIS — E43 Unspecified severe protein-calorie malnutrition: Secondary | ICD-10-CM | POA: Diagnosis present

## 2017-03-07 LAB — BASIC METABOLIC PANEL
ANION GAP: 7 (ref 5–15)
BUN: 66 mg/dL — AB (ref 6–20)
CALCIUM: 8.3 mg/dL — AB (ref 8.9–10.3)
CO2: 24 mmol/L (ref 22–32)
Chloride: 108 mmol/L (ref 101–111)
Creatinine, Ser: 3.26 mg/dL — ABNORMAL HIGH (ref 0.44–1.00)
GFR calc Af Amer: 15 mL/min — ABNORMAL LOW (ref >60)
GFR, EST NON AFRICAN AMERICAN: 13 mL/min — AB (ref >60)
GLUCOSE: 269 mg/dL — AB (ref 65–99)
POTASSIUM: 5 mmol/L (ref 3.5–5.1)
Sodium: 139 mmol/L (ref 135–145)

## 2017-03-07 LAB — COMPREHENSIVE METABOLIC PANEL
ALBUMIN: 2.9 g/dL — AB (ref 3.5–5.0)
ALT: 53 U/L (ref 14–54)
AST: 23 U/L (ref 15–41)
Alkaline Phosphatase: 63 U/L (ref 38–126)
Anion gap: 7 (ref 5–15)
BILIRUBIN TOTAL: 1.1 mg/dL (ref 0.3–1.2)
BUN: 64 mg/dL — AB (ref 6–20)
CALCIUM: 8.5 mg/dL — AB (ref 8.9–10.3)
CO2: 25 mmol/L (ref 22–32)
CREATININE: 3.12 mg/dL — AB (ref 0.44–1.00)
Chloride: 111 mmol/L (ref 101–111)
GFR calc Af Amer: 16 mL/min — ABNORMAL LOW (ref >60)
GFR calc non Af Amer: 14 mL/min — ABNORMAL LOW (ref >60)
GLUCOSE: 123 mg/dL — AB (ref 65–99)
Potassium: 4.2 mmol/L (ref 3.5–5.1)
SODIUM: 143 mmol/L (ref 135–145)
TOTAL PROTEIN: 5.3 g/dL — AB (ref 6.5–8.1)

## 2017-03-07 LAB — MAGNESIUM: Magnesium: 2 mg/dL (ref 1.7–2.4)

## 2017-03-07 LAB — CBC WITH DIFFERENTIAL/PLATELET
Basophils Absolute: 0 10*3/uL (ref 0.0–0.1)
Basophils Relative: 0 %
EOS PCT: 0 %
Eosinophils Absolute: 0 10*3/uL (ref 0.0–0.7)
HEMATOCRIT: 25.2 % — AB (ref 36.0–46.0)
Hemoglobin: 8.3 g/dL — ABNORMAL LOW (ref 12.0–15.0)
LYMPHS ABS: 0.6 10*3/uL — AB (ref 0.7–4.0)
LYMPHS PCT: 7 %
MCH: 31.1 pg (ref 26.0–34.0)
MCHC: 32.9 g/dL (ref 30.0–36.0)
MCV: 94.4 fL (ref 78.0–100.0)
MONO ABS: 0.2 10*3/uL (ref 0.1–1.0)
Monocytes Relative: 2 %
NEUTROS ABS: 7.6 10*3/uL (ref 1.7–7.7)
Neutrophils Relative %: 91 %
PLATELETS: 220 10*3/uL (ref 150–400)
RBC: 2.67 MIL/uL — ABNORMAL LOW (ref 3.87–5.11)
RDW: 17.1 % — AB (ref 11.5–15.5)
WBC: 8.3 10*3/uL (ref 4.0–10.5)

## 2017-03-07 LAB — GLUCOSE, CAPILLARY
GLUCOSE-CAPILLARY: 200 mg/dL — AB (ref 65–99)
Glucose-Capillary: 110 mg/dL — ABNORMAL HIGH (ref 65–99)
Glucose-Capillary: 229 mg/dL — ABNORMAL HIGH (ref 65–99)
Glucose-Capillary: 243 mg/dL — ABNORMAL HIGH (ref 65–99)

## 2017-03-07 LAB — PROTIME-INR
INR: 2.09
PROTHROMBIN TIME: 23.3 s — AB (ref 11.4–15.2)

## 2017-03-07 LAB — MRSA PCR SCREENING: MRSA by PCR: NEGATIVE

## 2017-03-07 MED ORDER — DILTIAZEM HCL 100 MG IV SOLR
5.0000 mg/h | INTRAVENOUS | Status: DC
  Administered 2017-03-07: 5 mg/h via INTRAVENOUS
  Filled 2017-03-07: qty 100

## 2017-03-07 MED ORDER — SODIUM CHLORIDE 0.9 % IV BOLUS (SEPSIS)
1000.0000 mL | Freq: Once | INTRAVENOUS | Status: AC
  Administered 2017-03-07: 1000 mL via INTRAVENOUS

## 2017-03-07 MED ORDER — WARFARIN SODIUM 2.5 MG PO TABS
2.5000 mg | ORAL_TABLET | Freq: Once | ORAL | Status: AC
  Administered 2017-03-07: 2.5 mg via ORAL
  Filled 2017-03-07: qty 1

## 2017-03-07 MED ORDER — CYANOCOBALAMIN 1000 MCG/ML IJ SOLN
1000.0000 ug | Freq: Every day | INTRAMUSCULAR | Status: AC
  Administered 2017-03-07 – 2017-03-09 (×3): 1000 ug via SUBCUTANEOUS
  Filled 2017-03-07 (×4): qty 1

## 2017-03-07 MED ORDER — METOPROLOL TARTRATE 50 MG PO TABS
50.0000 mg | ORAL_TABLET | Freq: Three times a day (TID) | ORAL | Status: DC
  Administered 2017-03-07: 50 mg via ORAL
  Filled 2017-03-07: qty 1

## 2017-03-07 MED ORDER — DIGOXIN 0.25 MG/ML IJ SOLN
0.2500 mg | Freq: Once | INTRAMUSCULAR | Status: AC
  Administered 2017-03-08: 0.25 mg via INTRAVENOUS
  Filled 2017-03-07: qty 1

## 2017-03-07 NOTE — Progress Notes (Signed)
IV Cardizem started per order. BP after first 30 min check was 87/57. Per protocol drip was stopped and MD notified with new orders received. Eulas Post, RN

## 2017-03-07 NOTE — Progress Notes (Signed)
OT Cancellation Note  Patient Details Name: CRYSTALLEE WERDEN MRN: [id number] DOB: 05/23/1941   Cancelled Treatment:    Reason Eval/Treat Not Completed: Medical issues which prohibited therapy. Note pt's HR is tachy this pm in the 160s per nursing vital note and spoke to nursing tech. Will check back at a different time for OT eval.  Philippa Chester 03/07/2017, 3:27 PM

## 2017-03-07 NOTE — Progress Notes (Signed)
Report called to RN Alainain SDU, patient to be transferred to room 1230. Daughter Margeart Allender notified per patient request. Family aware of transfer status. Patient alert & in no distress at this time. Per day RN, pt only voided around 200 ml today. Will bladder scan before transferring.

## 2017-03-07 NOTE — Progress Notes (Signed)
Central telemetry informed nurse of increased heart rate. Cardiologist present in room and was informed of rate. New orders obtained. Eulas Post, RN

## 2017-03-07 NOTE — Progress Notes (Signed)
Bladder scan showing 244 ml. On-call MD made aware,1L NS bolus infusing now. BP stable at 115/75, HR 111.

## 2017-03-07 NOTE — Consult Note (Signed)
[facility] Consult   Reason for Consult:  Capacity evaluation Referring Physician:  Dr. Posey Pronto Patient Identification: Jody Ruiz MRN:  [id number] Principal Diagnosis: Atrial fibrillation with RVR [facility]) Diagnosis:   Patient Active Problem List   Diagnosis Date Noted  . Protein-calorie malnutrition, severe [E43] 03/07/2017  . Atrial fibrillation ([place]) [I48.91] 03/04/2017  . Syncope [R55] 02/26/2017  . PAF (paroxysmal atrial fibrillation) ([place]) [I48.0] 02/26/2017  . Lactic acidemia [E87.2] 02/26/2017  . Chronic diastolic CHF (congestive heart failure) ([place]) [I50.32] 02/26/2017  . Diabetes mellitus with complication ([place]) [M35.5] 02/26/2017  . Dehydration [E86.0]   . Long term (current) use of anticoagulants [Z79.01] [Z79.01] 02/02/2017  . SOB (shortness of breath) [R06.02]   . Atrial fibrillation with RVR ([place]) [I48.91]   . Glomerulonephritis [N05.9]   . Loose stools [R19.5]   . History of Clostridium difficile colitis [Z86.19]   . AKI (acute kidney injury) ([place]) [N17.9] 01/14/2017  . Dyspepsia [R10.13]   . Acute kidney injury superimposed on chronic kidney disease ([place]) [N17.9, N18.9] 01/13/2017  . H/O umbilical hernia repair [H74.163, Z87.19] 01/13/2017  . Belching [R14.2] 01/02/2017  . Gastroesophageal reflux disease [K21.9] 01/02/2017  . Right sided abdominal pain [R10.9] 01/02/2017  . Palpitations [R00.2]   . 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 Chest pain [R07.9] 11/05/2016  . Renal insufficiency [N28.9] 10/16/2016  . B12 deficiency [E53.8] 10/16/2016  . Enteritis due to Clostridium difficile [A04.72]   . Diarrhea with dehydration [R19.7] 10/14/2016  . Microscopic colitis [K52.839] 10/02/2016  . Hx of adenomatous colonic polyps [Z86.010] 10/02/2016  . Altered mental status [R41.82]   . CAD (coronary artery disease) [I25.10]   . Interstitial lung disease ([place]) [J84.9]   . HLD (hyperlipidemia) [E78.5]   . Precordial pain [R07.2]  07/17/2013  . Midsternal chest pain [R07.89] 07/17/2013  . Pulmonary nodule [R91.1] 11/22/2012  . Elevated diaphragm on Right [J98.6] 10/09/2012  . OSA (obstructive sleep apnea) [G47.33] 10/05/2012  . IPF (idiopathic pulmonary fibrosis) ([place]) [J84.112] 08/26/2012  . Coronary atherosclerosis of native coronary artery [I25.10] 05/04/2012  . Hypertension [I10]   . Obesity [E66.9]     Total Time spent with patient: 1 hour  Subjective:   Jody Ruiz is a 76 y.o. female patient admitted with syncope due to dehydration.  HPI:  Jody Ruiz is a 76 y.o. 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  Patient seen, chart reviewed and case discussed with patient and her youngerdaughter who is at bedside. Patient endorses being depressed, stressed about ongoing health problems and not able to get appropriate medication regemen to work for her. Patient also reported she has been progressively getting worse because of nausea, unable to keep food or liquids and has to many restrictions because of renal impairment, atrial fibrillation and idiopathic pulmonary fibrosis and new problem is a glomerulonephritis. Patient has intact cognitions including orientation, concentration, memory and language functions.   Past Psychiatric History: none reported  Risk to Self: Is patient at risk for suicide?: No Risk to Others:   Prior Inpatient Therapy:   Prior Outpatient Therapy:    Past Medical History:  Past Medical History:  Diagnosis Date  . Arthritis   . Atrial fibrillation (  [place])   . Borderline hypothyroidism   . CAD (coronary artery disease)     a. 04/2012 NSTEMI/Cath:  pLAD 90%, mLAD 70-80% (long) (3x16 & 2.5x28 Promus DES to p/m LAD), pD1 70-80%, pRI 30% (small), CFX 20%, OM1 40%, mOM2 30%, pRCA 30%, PDA 30-40%, pPLB 70%, then mid 90%, EF 65%;  b. 03/2013 Abnl CL w/apical isch;  c. 04/2013 Cath: LM nl, LAD patent stents, LCX <20, RCA 30p PDA 40-50 EF 55-60% ->Cont Med Rx.  c. CP s/p LHC with stable dz and patent stents--> Rx medically   . Cataract   . Dyspnea    a. CP and SOB 06/2012 => Ticagrelor changed to Plavix  . History of Clostridium difficile infection   . HLD (hyperlipidemia)    intolerant to statins  . Hx of adenomatous colonic polyps   . Hx of echocardiogram    a. Echo 2/14:  mild LVH, EF 55-65%, Gr 1 diast dysfn, mild LAE  . Hypertension   . Idiopathic pulmonary fibrosis   . Internal hemorrhoids   . Lung nodules   . Microscopic colitis   . Myocardial infarction ([place]) 2013  . Obesity   . OSA (obstructive sleep apnea)    a. on CPAP  . Sleep apnea    cpap    Past Surgical History:  Procedure Laterality Date  . APPENDECTOMY  1994  . BREAST BIOPSY  1965, 1975   bilateral  . CARDIAC CATHETERIZATION     x4  . CHOLECYSTECTOMY  2004  . CORONARY STENT PLACEMENT  04/2012   x2  . KNEE ARTHROSCOPY  2000   bilateral  . LEFT AND RIGHT HEART CATHETERIZATION WITH CORONARY ANGIOGRAM N/A 08/02/2014   Procedure: LEFT AND RIGHT HEART CATHETERIZATION WITH CORONARY ANGIOGRAM;  Surgeon: Burnell Blanks, MD;  Location: [facility] CATH LAB;  Service: Cardiovascular;  Laterality: N/A;  . LEFT HEART CATHETERIZATION WITH CORONARY ANGIOGRAM N/A 04/29/2012   Procedure: LEFT HEART CATHETERIZATION WITH CORONARY ANGIOGRAM;  Surgeon: Peter M Martinique, MD;  Location: [facility] CATH LAB;  Service: Cardiovascular;  Laterality: N/A;  . LEFT HEART CATHETERIZATION WITH CORONARY ANGIOGRAM N/A 08/03/2012   Procedure: LEFT HEART CATHETERIZATION WITH CORONARY ANGIOGRAM;  Surgeon: Burnell Blanks, MD;  Location: [facility] CATH LAB;  Service: Cardiovascular;   Laterality: N/A;  . LEFT HEART CATHETERIZATION WITH CORONARY ANGIOGRAM N/A 04/12/2013   Procedure: LEFT HEART CATHETERIZATION WITH CORONARY ANGIOGRAM;  Surgeon: Peter M Martinique, MD;  Location: [facility] CATH LAB;  Service: Cardiovascular;  Laterality: N/A;  . PERCUTANEOUS CORONARY STENT INTERVENTION (PCI-S) N/A 04/29/2012   Procedure: PERCUTANEOUS CORONARY STENT INTERVENTION (PCI-S);  Surgeon: Peter M Martinique, MD;  Location: [facility] CATH LAB;  Service: Cardiovascular;  Laterality: N/A;  . SHOULDER ARTHROSCOPY  2012   left  . vaginal polypectomy  2004  . VENTRAL HERNIA REPAIR  2005   Family History:  Family History  Problem Relation Age of Onset  . Breast cancer Sister   . Prostate cancer Father   . Colon polyps Father   . Heart disease Father 45       CAD, died of ESRD  . Kidney disease Father   . Colon cancer Sister 89  . Heart disease Mother 34       Died acute MI  . Heart disease Brother 91       CABG  . Esophageal cancer Neg Hx   . Rectal cancer Neg Hx   . Stomach cancer Neg Hx    Family Psychiatric  History: Unknown Social History:  History  Alcohol Use No     History  Drug Use No    Social History   Social History  . Marital status: Married    Spouse name: N/A  . Number of children: 3  . Years of education: N/A   Occupational History  . Retired Therapist, sports Retired   Social History Main Topics  . Smoking status: Never Smoker  . Smokeless tobacco: Never Used     Comment: second hand smoke   . Alcohol use No  . Drug use: No  . Sexual activity: No   Other Topics Concern  . None   Social History Narrative  . None   Additional Social History:    Allergies:   Allergies  Allergen Reactions  . Brilinta [Ticagrelor] Shortness Of Breath, Other (See Comments) and Cough    Reaction:  Fatigue  . Lisinopril Shortness Of Breath and Cough  . Statins Other (See Comments)    Reaction:  Muscle pain/weakness  . Zetia [Ezetimibe] Other (See Comments)    Reaction:  Muscle  pain/weakness   . Macrodantin [Nitrofurantoin Macrocrystal] Rash  . Sulfa Antibiotics Rash    Labs:  Results for orders placed or performed during the hospital encounter of 03/04/17 (from the past 48 hour(s))  CK     Status: None   Collection Time: 03/05/17  2:27 PM  Result Value Ref Range   Total CK 49 38 - 234 U/L    Comment: SLIGHT HEMOLYSIS  Glucose, capillary     Status: Abnormal   Collection Time: 03/05/17  5:06 PM  Result Value Ref Range   Glucose-Capillary 177 (H) 65 - 99 mg/dL  Glucose, capillary     Status: Abnormal   Collection Time: 03/05/17  8:23 PM  Result Value Ref Range   Glucose-Capillary 200 (H) 65 - 99 mg/dL   Comment 1 Notify RN    Comment 2 Document in Chart   Urinalysis, Routine w reflex microscopic     Status: Abnormal   Collection Time: 03/05/17 11:51 PM  Result Value Ref Range   Color, Urine YELLOW YELLOW   APPearance CLEAR CLEAR   Specific Gravity, Urine 1.010 1.005 - 1.030   pH 5.0 5.0 - 8.0   Glucose, UA 50 (A) NEGATIVE mg/dL   Hgb urine dipstick LARGE (A) NEGATIVE   Bilirubin Urine NEGATIVE NEGATIVE   Ketones, ur NEGATIVE NEGATIVE mg/dL   Protein, ur 100 (A) NEGATIVE mg/dL   Nitrite NEGATIVE NEGATIVE   Leukocytes, UA MODERATE (A) NEGATIVE   RBC / HPF TOO NUMEROUS TO COUNT 0 - 5 RBC/hpf   WBC, UA TOO NUMEROUS TO COUNT 0 - 5 WBC/hpf   Bacteria, UA NONE SEEN NONE SEEN   Squamous Epithelial / LPF 0-5 (A) NONE SEEN   Mucus PRESENT   Hemoglobin A1c     Status: Abnormal   Collection Time: 03/06/17  4:41 AM  Result Value Ref Range   Hgb A1c MFr Bld 5.8 (H) 4.8 - 5.6 %    Comment: (NOTE) Pre diabetes:          5.7%-6.4% Diabetes:              >6.4% Glycemic control for   <7.0% adults with diabetes    Mean Plasma Glucose 119.76 mg/dL    Comment: Performed at [facility], [address] [place], [place] [address]  Protime-INR     Status: Abnormal   Collection Time: 03/06/17  4:41 AM  Result Value Ref Range  Prothrombin Time 27.0 (H)  11.4 - 15.2 seconds   INR 2.33   Basic metabolic panel     Status: Abnormal   Collection Time: 03/06/17  4:41 AM  Result Value Ref Range   Sodium 142 135 - 145 mmol/L   Potassium 3.8 3.5 - 5.1 mmol/L   Chloride 110 101 - 111 mmol/L   CO2 23 22 - 32 mmol/L   Glucose, Bld 124 (H) 65 - 99 mg/dL   BUN 69 (H) 6 - 20 mg/dL   Creatinine, Ser 3.28 (H) 0.44 - 1.00 mg/dL   Calcium 8.3 (L) 8.9 - 10.3 mg/dL   GFR calc non Af Amer 13 (L) >60 mL/min   GFR calc Af Amer 15 (L) >60 mL/min    Comment: (NOTE) The eGFR has been calculated using the CKD EPI equation. This calculation has not been validated in all clinical situations. eGFR's persistently <60 mL/min signify possible Chronic Kidney Disease.    Anion gap 9 5 - 15  Glucose, capillary     Status: Abnormal   Collection Time: 03/06/17  7:51 AM  Result Value Ref Range   Glucose-Capillary 122 (H) 65 - 99 mg/dL  Troponin I     Status: Abnormal   Collection Time: 03/06/17  9:51 AM  Result Value Ref Range   Troponin I 0.05 (HH) <0.03 ng/mL    Comment: CRITICAL RESULT CALLED TO, READ BACK BY AND VERIFIED WITH: K.DUNKELBERGER RN 03/06/2017 1124 BY J.ROCK   Magnesium     Status: Abnormal   Collection Time: 03/06/17  9:51 AM  Result Value Ref Range   Magnesium 1.6 (L) 1.7 - 2.4 mg/dL  Glucose, capillary     Status: Abnormal   Collection Time: 03/06/17 11:53 AM  Result Value Ref Range   Glucose-Capillary 143 (H) 65 - 99 mg/dL  Glucose, capillary     Status: Abnormal   Collection Time: 03/06/17  4:53 PM  Result Value Ref Range   Glucose-Capillary 195 (H) 65 - 99 mg/dL  Glucose, capillary     Status: Abnormal   Collection Time: 03/06/17  8:30 PM  Result Value Ref Range   Glucose-Capillary 195 (H) 65 - 99 mg/dL  Protime-INR     Status: Abnormal   Collection Time: 03/07/17  5:37 AM  Result Value Ref Range   Prothrombin Time 23.3 (H) 11.4 - 15.2 seconds   INR 2.09   Comprehensive metabolic panel     Status: Abnormal   Collection Time:  03/07/17  5:37 AM  Result Value Ref Range   Sodium 143 135 - 145 mmol/L   Potassium 4.2 3.5 - 5.1 mmol/L   Chloride 111 101 - 111 mmol/L   CO2 25 22 - 32 mmol/L   Glucose, Bld 123 (H) 65 - 99 mg/dL   BUN 64 (H) 6 - 20 mg/dL   Creatinine, Ser 3.12 (H) 0.44 - 1.00 mg/dL   Calcium 8.5 (L) 8.9 - 10.3 mg/dL   Total Protein 5.3 (L) 6.5 - 8.1 g/dL   Albumin 2.9 (L) 3.5 - 5.0 g/dL   AST 23 15 - 41 U/L   ALT 53 14 - 54 U/L   Alkaline Phosphatase 63 38 - 126 U/L   Total Bilirubin 1.1 0.3 - 1.2 mg/dL   GFR calc non Af Amer 14 (L) >60 mL/min   GFR calc Af Amer 16 (L) >60 mL/min    Comment: (NOTE) The eGFR has been calculated using the CKD EPI equation. This calculation has not been validated  in all clinical situations. eGFR's persistently <60 mL/min signify possible Chronic Kidney Disease.    Anion gap 7 5 - 15  Glucose, capillary     Status: Abnormal   Collection Time: 03/07/17  7:42 AM  Result Value Ref Range   Glucose-Capillary 110 (H) 65 - 99 mg/dL  Glucose, capillary     Status: Abnormal   Collection Time: 03/07/17 12:08 PM  Result Value Ref Range   Glucose-Capillary 229 (H) 65 - 99 mg/dL    Current Facility-Administered Medications  Medication Dose Route Frequency Provider Last Rate Last Dose  . 0.9 %  sodium chloride infusion  250 mL Intravenous PRN Reyne Dumas, MD   Stopped at 03/06/17 1900  . acetaminophen (TYLENOL) tablet 650 mg  650 mg Oral Q6H PRN Charlynne Cousins, MD       Or  . acetaminophen (TYLENOL) suppository 650 mg  650 mg Rectal Q6H PRN Charlynne Cousins, MD      . amLODipine (NORVASC) tablet 5 mg  5 mg Oral Daily Charlynne Cousins, MD   5 mg at 03/07/17 0827  . aspirin EC tablet 81 mg  81 mg Oral Daily Charlynne Cousins, MD   81 mg at 03/07/17 0827  . cyanocobalamin ((VITAMIN B-12)) injection 1,000 mcg  1,000 mcg Subcutaneous Daily Lavina Hamman, MD      . dicyclomine (BENTYL) capsule 10 mg  10 mg Oral TID PRN Reyne Dumas, MD      . famotidine  (PEPCID) tablet 20 mg  20 mg Oral Daily Reyne Dumas, MD   20 mg at 03/07/17 0828  . insulin aspart (novoLOG) injection 0-5 Units  0-5 Units Subcutaneous QHS Charlynne Cousins, MD      . insulin aspart (novoLOG) injection 0-9 Units  0-9 Units Subcutaneous TID WC Charlynne Cousins, MD      . metoprolol tartrate (LOPRESSOR) tablet 50 mg  50 mg Oral TID Allred, Jeneen Rinks, MD      . nitroGLYCERIN (NITROSTAT) SL tablet 0.4 mg  0.4 mg Sublingual Q5 Min x 3 PRN Charlynne Cousins, MD      . ondansetron [facility]) injection 4 mg  4 mg Intravenous Q6H PRN Charlynne Cousins, MD   4 mg at 03/06/17 0452  . ondansetron (ZOFRAN) tablet 4 mg  4 mg Oral Q8H PRN Charlynne Cousins, MD      . polyethylene glycol [facility] Floria Raveling) packet 17 g  17 g Oral Daily PRN Charlynne Cousins, MD      . polyvinyl alcohol (LIQUIFILM TEARS) 1.4 % ophthalmic solution 1 drop  1 drop Both Eyes TID PRN Charlynne Cousins, MD      . predniSONE (DELTASONE) tablet 40 mg  40 mg Oral Q breakfast Berton Mount, RPH   40 mg at 03/07/17 0826  . protein supplement (UNJURY CHICKEN SOUP) powder 8 oz  8 oz Oral BID Reyne Dumas, MD      . saccharomyces boulardii (FLORASTOR) capsule 250 mg  250 mg Oral BID Charlynne Cousins, MD   Stopped at 03/05/17 1340  . sodium chloride flush (NS) 0.9 % injection 3 mL  3 mL Intravenous Q12H Charlynne Cousins, MD   3 mL at 03/06/17 2204  . sodium chloride flush (NS) 0.9 % injection 3 mL  3 mL Intravenous PRN Charlynne Cousins, MD      . traMADol Veatrice Bourbon) tablet 50 mg  50 mg Oral Q6H PRN Charlynne Cousins, MD      .  warfarin (COUMADIN) tablet 2.5 mg  2.5 mg Oral ONCE-1800 Lavina Hamman, MD      . Warfarin - Pharmacist Dosing Inpatient   Does not apply q1800 Berton Mount, [facility]        Musculoskeletal: Strength & Muscle Tone: decreased Gait & Station: unable to stand Patient leans: N/A  Psychiatric Specialty Exam: Physical Exam as per history and physical  ROS  generalized weakness, nausea, somewhat dehydration, recent syncopal episode at home and emotionally down with the depression and anxiety. No Fever-chills, No Headache, No changes with Vision or hearing, reports vertigo No problems swallowing food or Liquids, No Chest pain, Cough or Shortness of Breath, No Abdominal pain, No Nausea or Vommitting, Bowel movements are regular, No Blood in stool or Urine, No dysuria, No new skin rashes or bruises, No new joints pains-aches,  No new weakness, tingling, numbness in any extremity, No recent weight gain or loss, No polyuria, polydypsia or polyphagia,  A full 10 point Review of Systems was done, except as stated above, all other Review of Systems were negative.  Blood pressure 110/67, pulse (!) 114, temperature 98.2 F (36.8 C), temperature source Oral, resp. rate 18, height _0  (1.702 m), weight 81.3 kg (179 lb 3.7 oz), SpO2 98 %.Body mass index is 28.07 kg/m.  General Appearance: Guarded  Eye Contact:  Fair  Speech:  Clear and Coherent and Slow  Volume:  Decreased  Mood:  Anxious, Depressed, Hopeless and Worthless  Affect:  Depressed and Flat  Thought Process:  Coherent and Goal Directed  Orientation:  Full (Time, Place, and Person)  Thought Content:  Illogical, Obsessions and Rumination  Suicidal Thoughts:  Yes.  without intent/plan  Homicidal Thoughts:  No  Memory:  Immediate;   Good Recent;   Fair Remote;   Fair  Judgement:  Impaired  Insight:  Fair  Psychomotor Activity:  Decreased  Concentration:  Concentration: Fair and Attention Span: Fair  Recall:  Good  Fund of Knowledge:  Good  Language:  Good  Akathisia:  Negative  Handed:  Right  AIMS (if indicated):     Assets:  Communication Skills Desire for Improvement Financial Resources/Insurance Housing Leisure Time Physical Health Resilience Social Support Talents/Skills Transportation  ADL's:  Impaired  Cognition:  WNL  Sleep:        Treatment Plan Summary:  76 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 Patient is not consented for this medication at this time  Disposition: Supportive therapy provided about ongoing stressors.  Ambrose Finland, MD 03/07/2017 12:46 PM

## 2017-03-07 NOTE — Progress Notes (Signed)
[facility] Progress Note  [name]   PCP: Burnard Bunting, MD DOB: [birth year]   DOA: 03/04/2017   DOS: 03/07/2017   Date of Service: the patient was seen and examined on 03/07/2017  Subjective: Feeling fatigue and tired. Her chest pain or shortness of breath. An episode of vomiting after breakfast. No diarrhea no constipation. Actually has incontinence of stool.  Brief hospital course: Pt. with PMH of chronic A. 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 fib with RVR. Currently further plan is continue rate control.  Assessment and Plan: 1. A. fib with RVR. Heart rate still elevated. Received IV Cardizem drip and heart rate remained under control. Patient has reoccurrence of RVR multiple times, cardiology consulted as well as EP consulted today. Not a candidate for atrial fibrillation ablation, or antiarrhythmic therapy due to her comorbidities recommend to continue rate control medications for now. Maintain K more than 4 med more than 2. TSH within normal limits. Mild elevation of troponin likely due to demand ischemia. Continue to monitor on telemetry. Continue Coumadin per pharmacy.  2. Depression. Appreciate psychiatry input. Patient and family requested psychiatric consultation for capacity evaluation. Per psychiatry evaluation patient does have capacity to make her own medical decision. Psychiatry recommended Remeron although the patient has refused it for now.  3. Chronic kidney disease. ANA vasculitis. Concern for UTI. Patient presented with a fall. UA was showing increased WBC but it also has increased, 7 epithelial cell therefore do not think that this is a clean sample. Patient does not have any fever or chills or leukocytosis to have a concern for UTI. Patient is on Rituxan for ANA vasculitis and a patient can continue with her regular  dosing. Patient will follow up with nephrology as an outpatient.  4. Fall. No focal deficit, likely from generalized deconditioning from recurrent hospitalization. PT consult recommends no PT follow-up although patient required significant assistance, rolling walker and did not walk at all with physical therapy.  5. Daily with a pulmonary fibrosis. Continue steroids, patient is currently taking 40 mg daily.  6. Essential hypertension. Continue current regimen.  7. Severe B-12 deficiency. Recommend patient to continue B-12 injections as an outpatient on a weekly basis.  8. Nausea and vomiting. medication induced. Monitor now. Minimize unnecessary medication.  9. Chronic diastolic CHF. Euvolemic for now.  patient was receiving IV fluids, will change it.  Diet: Cardiac diet DVT Prophylaxis: on therapeutic anticoagulation.  Advance goals of care discussion: full code  Family Communication: family was present at bedside, at the time of interview. The pt provided permission to discuss medical plan with the family. Opportunity was given to ask question and all questions were answered satisfactorily.   Disposition:  Discharge to be determined.  Consultants: EP, cardiology, psychiatry Procedures: none  Antibiotics: Anti-infectives    Start     Dose/Rate Route Frequency Ordered Stop   03/06/17 1300  cefTRIAXone (ROCEPHIN) 1 g in dextrose 5 % 50 mL IVPB  Status:  Discontinued     1 g 100 mL/hr over 30 Minutes Intravenous Every 24 hours 03/06/17 1212 03/06/17 1311       Objective: Physical Exam: Vitals:   03/06/17 2032 03/07/17 0516 03/07/17 0826 03/07/17 1510  BP: 127/77 (!) 147/65 110/67 104/84  Pulse: 92 81 (!) 114 (!) 163  Resp: 20 18    Temp: 98.4 F (36.9 C) 98.2 F (36.8 C)  TempSrc: Oral Oral    SpO2: 97% 98%  96%  Weight:      Height:        Intake/Output Summary (Last 24 hours) at 03/07/17 1523 Last data filed at 03/07/17 1300  Gross per 24 hour  Intake            707.17 ml  Output                0 ml  Net           707.17 ml   Filed Weights   03/05/17 0842 03/05/17 1423  Weight: 83.9 kg (185 lb) 81.3 kg (179 lb 3.7 oz)   General: Alert, Awake and Oriented to Time, Place and Person. Appear in mild distress, affect flat Eyes: PERRL, Conjunctiva normal ENT: Oral Mucosa clear moist. Neck: no JVD, no Abnormal Mass Or lumps Cardiovascular: S1 and S2 Present, no Murmur, Peripheral Pulses Present Respiratory: normal respiratory effort, Bilateral Air entry equal and Decreased, no use of accessory muscle, Clear to Auscultation, no Crackles, no wheezes Abdomen: Bowel Sound present, Soft and no tenderness, no hernia Skin: no redness, no Rash, no induration Extremities: trace Pedal edema, no calf tenderness Neurologic: Grossly no focal neuro deficit. Bilaterally Equal motor strength  Data Reviewed: CBC:  Recent Labs Lab 03/01/17 0519 03/02/17 0419 03/04/17 1313 03/04/17 1325 03/05/17 1049  WBC 7.2  --  10.2  --  9.9  NEUTROABS  --   --  9.4*  --   --   HGB 8.2* 8.6* 8.9* 8.5* 8.1*  HCT 25.5* 26.3* 25.0* 25.0* 23.6*  MCV 90.4  --  93.3  --  92.2  PLT 195  --  250  --  542   Basic Metabolic Panel:  Recent Labs Lab 03/01/17 0519 03/02/17 0419 03/04/17 1313 03/04/17 1325 03/06/17 0441 03/06/17 0951 03/07/17 0537  NA 140 139 139 140 142  --  143  K 4.3 4.3 3.9 3.9 3.8  --  4.2  CL 110 110 107 107 110  --  111  CO2 22 22 20*  --  23  --  25  GLUCOSE 130* 140* 133* 129* 124*  --  123*  BUN 69* 68* 72* 69* 69*  --  64*  CREATININE 3.53* 3.53* 3.50* 3.50* 3.28*  --  3.12*  CALCIUM 7.9* 8.3* 8.6*  --  8.3*  --  8.5*  MG  --   --   --   --   --  1.6*  --   PHOS  --  5.1*  --   --   --   --   --     Liver Function Tests:  Recent Labs Lab 03/02/17 0419 03/04/17 1313 03/07/17 0537  AST  --  27 23  ALT  --  54 53  ALKPHOS  --  62 63  BILITOT  --  1.3* 1.1  PROT  --  5.5* 5.3*  ALBUMIN 2.7* 3.0* 2.9*   No results for  input(s): LIPASE, AMYLASE in the last 168 hours. No results for input(s): AMMONIA in the last 168 hours. Coagulation Profile:  Recent Labs Lab 03/02/17 0419 03/04/17 1313 03/05/17 0635 03/06/17 0441 03/07/17 0537  INR 3.12 2.25 2.44 2.53 2.09   Cardiac Enzymes:  Recent Labs Lab 03/05/17 1427 03/06/17 0951  CKTOTAL 49  --   TROPONINI  --  0.05*   BNP (last 3 results) No results for input(s): PROBNP in the last 8760 hours. CBG:  Recent Labs  Lab 03/06/17 1153 03/06/17 1653 03/06/17 2030 03/07/17 0742 03/07/17 1208  GLUCAP 143* 195* 195* 110* 229*   Studies: No results found.  Scheduled Meds: . amLODipine  5 mg Oral Daily  . aspirin EC  81 mg Oral Daily  . cyanocobalamin  1,000 mcg Subcutaneous Daily  . famotidine  20 mg Oral Daily  . insulin aspart  0-5 Units Subcutaneous QHS  . insulin aspart  0-9 Units Subcutaneous TID WC  . metoprolol tartrate  50 mg Oral TID  . predniSONE  40 mg Oral Q breakfast  . protein supplement  8 oz Oral BID  . saccharomyces boulardii  250 mg Oral BID  . sodium chloride flush  3 mL Intravenous Q12H  . warfarin  2.5 mg Oral ONCE-1800  . Warfarin - Pharmacist Dosing Inpatient   Does not apply q1800   Continuous Infusions: . sodium chloride Stopped (03/06/17 1900)   PRN Meds: sodium chloride, acetaminophen **OR** acetaminophen, dicyclomine, nitroGLYCERIN, [DISCONTINUED] ondansetron **OR** ondansetron (ZOFRAN) IV, ondansetron, polyethylene glycol, polyvinyl alcohol, sodium chloride flush, traMADol  Time spent: 35 minutes  Author: Berle Mull, MD [facility] Pager: [phone] 03/07/2017 3:23 PM  If 7PM-7AM, please contact night-coverage at [email or web address], password [facility]

## 2017-03-07 NOTE — Consult Note (Addendum)
EP CONSULT NOTE     Primary Care Physician: Burnard Bunting, MD Referring Physician:  Dr Johnsie Cancel  Admit Date: 03/04/2017  Reason for consultation:  afib with RVR  Jody Ruiz is a 76 y.o. female with a h/o recent clinical decline.  She has been diagnosed with rapidly progressive glomerulonephritis ANA vasculitis and has required steroids and rituximab which she has not well tolerated.  She also CAD s/p prior PCI and interstitial lung disease.  She is admitted with afib with RVR.  Of note, she has recently also had sinus bradycardia and pauses of 3.3 seconds with afib termination. Presently, she is very ill.  She states "I just want to go ahead and die because I am not going to get better".  "My family doesn't want to listen to me and says that I cannot make my own decisions.  A psychiatric assessment has been ordered'.   She is nauseated currently and vomiting while talking to me.  She is also very weak.  Today, she denies symptoms of palpitations, chest pain, shortness of breath, or neurologic sequela. The patient is tolerating medications without difficulties and is otherwise without complaint today.   Past Medical History:  Diagnosis Date  . Arthritis   . Atrial fibrillation ([place])   . Borderline hypothyroidism   . CAD (coronary artery disease)    a. 04/2012 NSTEMI/Cath:  pLAD 90%, mLAD 70-80% (long) (3x16 & 2.5x28 Promus DES to p/m LAD), pD1 70-80%, pRI 30% (small), CFX 20%, OM1 40%, mOM2 30%, pRCA 30%, PDA 30-40%, pPLB 70%, then mid 90%, EF 65%;  b. 03/2013 Abnl CL w/apical isch;  c. 04/2013 Cath: LM nl, LAD patent stents, LCX <20, RCA 30p PDA 40-50 EF 55-60% ->Cont Med Rx.  c. CP s/p LHC with stable dz and patent stents--> Rx medically   . Cataract   . Dyspnea    a. CP and SOB 06/2012 => Ticagrelor changed to Plavix  . History of Clostridium difficile infection   . HLD (hyperlipidemia)    intolerant to statins  . Hx of adenomatous colonic polyps   . Hx of echocardiogram    a.  Echo 2/14:  mild LVH, EF 55-65%, Gr 1 diast dysfn, mild LAE  . Hypertension   . Idiopathic pulmonary fibrosis   . Internal hemorrhoids   . Lung nodules   . Microscopic colitis   . Myocardial infarction ([place]) 2013  . Obesity   . OSA (obstructive sleep apnea)    a. on CPAP  . Sleep apnea    cpap   Past Surgical History:  Procedure Laterality Date  . APPENDECTOMY  1994  . BREAST BIOPSY  1965, 1975   bilateral  . CARDIAC CATHETERIZATION     x4  . CHOLECYSTECTOMY  2004  . CORONARY STENT PLACEMENT  04/2012   x2  . KNEE ARTHROSCOPY  2000   bilateral  . LEFT AND RIGHT HEART CATHETERIZATION WITH CORONARY ANGIOGRAM N/A 08/02/2014   Procedure: LEFT AND RIGHT HEART CATHETERIZATION WITH CORONARY ANGIOGRAM;  Surgeon: Burnell Blanks, MD;  Location: [facility] CATH LAB;  Service: Cardiovascular;  Laterality: N/A;  . LEFT HEART CATHETERIZATION WITH CORONARY ANGIOGRAM N/A 04/29/2012   Procedure: LEFT HEART CATHETERIZATION WITH CORONARY ANGIOGRAM;  Surgeon: Peter M Martinique, MD;  Location: [facility] CATH LAB;  Service: Cardiovascular;  Laterality: N/A;  . LEFT HEART CATHETERIZATION WITH CORONARY ANGIOGRAM N/A 08/03/2012   Procedure: LEFT HEART CATHETERIZATION WITH CORONARY ANGIOGRAM;  Surgeon: Burnell Blanks, MD;  Location: [facility] CATH LAB;  Service: Cardiovascular;  Laterality: N/A;  . LEFT HEART CATHETERIZATION WITH CORONARY ANGIOGRAM N/A 04/12/2013   Procedure: LEFT HEART CATHETERIZATION WITH CORONARY ANGIOGRAM;  Surgeon: Peter M Martinique, MD;  Location: [facility] CATH LAB;  Service: Cardiovascular;  Laterality: N/A;  . PERCUTANEOUS CORONARY STENT INTERVENTION (PCI-S) N/A 04/29/2012   Procedure: PERCUTANEOUS CORONARY STENT INTERVENTION (PCI-S);  Surgeon: Peter M Martinique, MD;  Location: [facility] CATH LAB;  Service: Cardiovascular;  Laterality: N/A;  . SHOULDER ARTHROSCOPY  2012   left  . vaginal polypectomy  2004  . VENTRAL HERNIA REPAIR  2005    . amLODipine  5 mg Oral Daily  . aspirin EC  81 mg Oral Daily  .  cyanocobalamin  1,000 mcg Intramuscular q morning - 10a  . famotidine  20 mg Oral Daily  . insulin aspart  0-5 Units Subcutaneous QHS  . insulin aspart  0-9 Units Subcutaneous TID WC  . metoprolol tartrate  50 mg Oral TID  . potassium chloride  40 mEq Oral Once  . predniSONE  40 mg Oral Q breakfast   Followed by  . [START ON 03/13/2017] predniSONE  30 mg Oral Q breakfast   Followed by  . [START ON 03/27/2017] predniSONE  20 mg Oral Q breakfast   Followed by  . [START ON 04/10/2017] predniSONE  10 mg Oral Q breakfast   Followed by  . [START ON 04/24/2017] predniSONE  5 mg Oral Q breakfast   Followed by  . [START ON 05/08/2017] predniSONE  5 mg Oral Q48H  . protein supplement  8 oz Oral BID  . saccharomyces boulardii  250 mg Oral BID  . sodium chloride flush  3 mL Intravenous Q12H  . warfarin  2.5 mg Oral ONCE-1800  . Warfarin - Pharmacist Dosing Inpatient   Does not apply q1800   . sodium chloride Stopped (03/06/17 1900)    Allergies  Allergen Reactions  . Brilinta [Ticagrelor] Shortness Of Breath, Other (See Comments) and Cough    Reaction:  Fatigue  . Lisinopril Shortness Of Breath and Cough  . Statins Other (See Comments)    Reaction:  Muscle pain/weakness  . Zetia [Ezetimibe] Other (See Comments)    Reaction:  Muscle pain/weakness   . Macrodantin [Nitrofurantoin Macrocrystal] Rash  . Sulfa Antibiotics Rash    Social History   Social History  . Marital status: Married    Spouse name: N/A  . Number of children: 3  . Years of education: N/A   Occupational History  . Retired Therapist, sports Retired   Social History Main Topics  . Smoking status: Never Smoker  . Smokeless tobacco: Never Used     Comment: second hand smoke   . Alcohol use No  . Drug use: No  . Sexual activity: No   Other Topics Concern  . Not on file   Social History Narrative  . No narrative on file    Family History  Problem Relation Age of Onset  . Breast cancer Sister   . Prostate cancer Father   .  Colon polyps Father   . Heart disease Father 73       CAD, died of ESRD  . Kidney disease Father   . Colon cancer Sister 34  . Heart disease Mother 31       Died acute MI  . Heart disease Brother 41       CABG  . Esophageal cancer Neg Hx   . Rectal cancer Neg Hx   . Stomach cancer Neg Hx  ROS- pt is too ill, vomiting during my exam, and cannot complete full ROS  Physical Exam: Telemetry: Vitals:   03/06/17 1657 03/06/17 2032 03/07/17 0516 03/07/17 0826  BP: 126/64 127/77 (!) 147/65 110/67  Pulse: 95 92 81 (!) 114  Resp: 18 20 18    Temp: 99 F (37.2 C) 98.4 F (36.9 C) 98.2 F (36.8 C)   TempSrc: Oral Oral Oral   SpO2: 98% 97% 98%   Weight:      Height:        GEN- The patient is ill appearing, lethargic Head- normocephalic, atraumatic Eyes-  Sclera clear, conjunctiva pink Ears- hearing intact Oropharynx- clear Neck- supple,   Lungs- few basilar rales Heart- tachycardic irregular rhythm GI- soft, NT, ND, + BS Extremities- no clubbing, cyanosis, or edema MS- no significant deformity or atrophy Skin- no rash or lesion Psych- lethargic Neuro- strength and sensation are intact  EKG:  afib with RVR,  There are also recent ekgs with sinus rhythm and sinus bradycardia  Labs:   Lab Results  Component Value Date   WBC 9.9 03/05/2017   HGB 8.1 (L) 03/05/2017   HCT 23.6 (L) 03/05/2017   MCV 92.2 03/05/2017   PLT 209 03/05/2017    Recent Labs Lab 03/07/17 0537  NA 143  K 4.2  CL 111  CO2 25  BUN 64*  CREATININE 3.12*  CALCIUM 8.5*  PROT 5.3*  BILITOT 1.1  ALKPHOS 63  ALT 53  AST 23  GLUCOSE 123*   Lab Results  Component Value Date   CKTOTAL 49 03/05/2017   TROPONINI 0.05 (HH) 03/06/2017    Lab Results  Component Value Date   CHOL 178 11/06/2016   CHOL 165 04/29/2012   Lab Results  Component Value Date   HDL 34 (L) 11/06/2016   HDL 43 04/29/2012   Lab Results  Component Value Date   LDLCALC 114 (H) 11/06/2016   LDLCALC 96 04/29/2012     Lab Results  Component Value Date   TRIG 152 (H) 11/06/2016   TRIG 132 04/29/2012   Lab Results  Component Value Date   CHOLHDL 5.2 11/06/2016   CHOLHDL 3.8 04/29/2012   No results found for: LDLDIRECT     Echo:  Preserved EF, normal LA size  ASSESSMENT AND PLAN:   1. Persistent afib Quite tachycardic currently Unfortunately, she is not a candidate for AAD therapy due to her multiple comorbidities.  She is also not a candidate for afib ablation.  Rate control is currently our only option.  I will increase metoprolol to 50mg  BID at this time.  I worry that her Tachycardia may be exacerbated by her current illness. chads2vasc score is at least 4.  Continue on coumadin if able I would advise that we manage her afib with RVR with oral medicines and avoid IV medicines or ICU transfers as she is minimally symptomatic at this time.  2. Tachycardia/ bradycardia She has previously had sinus bradycardia.  Will continue to titrate metoprolol as above and will reassess should she convert to sinus rhythm. If she has sinus bradycardia with symptoms going forward, she may require PPM.  Ultimately, AV nodal ablation and PPM may be her best option.  She and I would like to avoid this if possible.  3. HTN Stable No change required today  4. Code status She is clear to me that she wishes to be DNI/DNR.  She also states that her family resists her wishes and suggests that there is some  question as to whether she can make her own medical decision.  I will defer to primary team. She is very willing to have palliative care consultation.  I think that this may be in her best interest and could help her and her family decide together what her goals of care should be. I am strongly in favor of palliative care consultation but will defer this decision to the primary team.   Very sick and ill patient.  She has medicine refractory arrhythmias.  A high level of decision making was required for this  encounter.  Thompson Grayer, MD 03/07/2017  8:49 AM

## 2017-03-07 NOTE — Progress Notes (Signed)
[place] for warfarin Indication: atrial fibrillation  Allergies  Allergen Reactions  . Brilinta [Ticagrelor] Shortness Of Breath, Other (See Comments) and Cough    Reaction:  Fatigue  . Lisinopril Shortness Of Breath and Cough  . Statins Other (See Comments)    Reaction:  Muscle pain/weakness  . Zetia [Ezetimibe] Other (See Comments)    Reaction:  Muscle pain/weakness   . Macrodantin [Nitrofurantoin Macrocrystal] Rash  . Sulfa Antibiotics Rash    Patient Measurements: Height: 5\' 7"  (170.2 cm) Weight: 179 lb 3.7 oz (81.3 kg) IBW/kg (Calculated) : 61.6  Vital Signs: Temp: 98.2 F (36.8 C) (09/01 0516) Temp Source: Oral (09/01 0516) BP: 147/65 (09/01 0516) Pulse Rate: 81 (09/01 0516)  Labs:  Recent Labs  03/04/17 1313 03/04/17 1325 03/05/17 0635 03/05/17 1049 03/05/17 1427 03/06/17 0441 03/06/17 0951 03/07/17 0537  HGB 8.9* 8.5*  --  8.1*  --   --   --   --   HCT 25.0* 25.0*  --  23.6*  --   --   --   --   PLT 250  --   --  209  --   --   --   --   LABPROT 24.7*  --  26.3*  --   --  27.0*  --  23.3*  INR 2.25  --  2.44  --   --  2.53  --  2.09  CREATININE 3.50* 3.50*  --   --   --  3.28*  --  3.12*  CKTOTAL  --   --   --   --  49  --   --   --   TROPONINI  --   --   --   --   --   --  0.05*  --     Estimated Creatinine Clearance: 17.4 mL/min (A) (by C-G formula based on SCr of 3.12 mg/dL (H)).    Assessment: 89 YOF presents with weakness d/t atrial fibrillation with RVR.  History of atrial fibrillation on warfarin therapy.  INR therapeutic at time of admission. INR noted to be supratherapeutic on 8/20 at warfarin clinic (held warfarin x 2 days and resumed at regimen below)  Home warfarin: 1.25mg  MWF and 2.5mg  TTSS (last dose 8/28)  Today, 03/07/2017  INR remains therapeutic  CBC:(last labs 8/30) Hgb decreased but consistent with previous values, pltc WNL  No major drug interactions. On ASA 81mg  for CAD w/ stents  (2013)  Goal of Therapy:  INR 2-3   Plan:   Warfarin 2.5mg  x 1 today  Daily INR, CBC at least weekly  Monitor for signs of bleeding or thrombosis  Dolly Rias RPh 03/07/2017, 8:11 AM Pager [phone]

## 2017-03-07 NOTE — Evaluation (Signed)
Physical Therapy Evaluation Patient Details Name: Jody Ruiz MRN: [id number] DOB: 1940/11/21 Today's Date: 03/07/2017   History of Present Illness  Jody Ruiz is a 76 y.o. female with a h/o recent clinical decline.  She has been diagnosed with rapidly progressive glomerulonephritis ANA vasculitis and has required steroids and rituximab which she has not well tolerated.  She also CAD s/p prior PCI and interstitial lung disease.  She is admitted with afib with RVR.  Of note, she has recently also had sinus bradycardia and pauses of 3.3 seconds with afib termination.  Clinical Impression  Pt admitted with above diagnosis. Pt currently with functional limitations due to the deficits listed below (see PT Problem List). * Pt will benefit from skilled PT to increase their independence and safety with mobility to allow discharge to the venue listed below.    Discussion with pt regarding PT role, pt states we can come back but she just wants to lie in bed; pt is alert and oriented x4 but reports her family feels she isn't able to make her own decisions regarding her care; pt speaks realistically regarding her medical issues/disease progression;  Pt would certainly benefit from a visit from our Palliative Care Team    Follow Up Recommendations No PT follow up (pt declines)    Equipment Recommendations  None recommended by PT    Recommendations for Other Services       Precautions / Restrictions Precautions Precautions: Fall Precaution Comments: pt denies falls, but holds furniture at home Restrictions Weight Bearing Restrictions: No      Mobility  Bed Mobility Overal bed mobility: Needs Assistance Bed Mobility: Supine to Sit;Sit to Supine     Supine to sit: Supervision Sit to supine: Supervision   General bed mobility comments: for safety, HOB flat  Transfers Overall transfer level: Needs assistance Equipment used: Rolling walker (2 wheeled) Transfers: Sit to/from  Stand Sit to Stand: Min guard         General transfer comment: for safety, cues to control descent  Ambulation/Gait             General Gait Details: pt declines amb stating she is too fatigued  Science writer    Modified Rankin (Stroke Patients Only)       Balance Overall balance assessment: Needs assistance Sitting-balance support: No upper extremity supported;Feet supported Sitting balance-Leahy Scale: Good     Standing balance support: No upper extremity supported Standing balance-Leahy Scale: Fair Standing balance comment: able to stand without UE support                             Pertinent Vitals/Pain Pain Assessment: No/denies pain    Home Living Family/patient expects to be discharged to:: Private residence Living Arrangements: Spouse/significant other Available Help at Discharge: Family;Available 24 hours/day Type of Home: House Home Access: Ramped entrance     Home Layout: Two level;Able to live on main level with bedroom/bathroom Home Equipment: Shower seat;Grab bars - tub/shower;Hand held shower head;Walker - 2 wheels;Cane - single point;Bedside commode      Prior Function Level of Independence: Independent               Hand Dominance        Extremity/Trunk Assessment                Communication   Communication: No difficulties  Cognition Arousal/Alertness: Awake/alert Behavior During Therapy: WFL for tasks assessed/performed Overall Cognitive Status: Within Functional Limits for tasks assessed                                 General Comments: alert and oriented x 4      General Comments      Exercises     Assessment/Plan    PT Assessment Patient needs continued PT services  PT Problem List Decreased strength;Decreased activity tolerance;Decreased mobility       PT Treatment Interventions DME instruction;Gait training;Functional mobility  training;Therapeutic activities;Patient/family education    PT Goals (Current goals can be found in the Care Plan section)  Acute Rehab PT Goals Patient Stated Goal: none stated PT Goal Formulation: With patient Time For Goal Achievement: 03/14/17 Potential to Achieve Goals: Fair    Frequency Min 2X/week   Barriers to discharge        Co-evaluation               AM-PAC PT "6 Clicks" Daily Activity  Outcome Measure Difficulty turning over in bed (including adjusting bedclothes, sheets and blankets)?: A Little Difficulty moving from lying on back to sitting on the side of the bed? : A Little Difficulty sitting down on and standing up from a chair with arms (e.g., wheelchair, bedside commode, etc,.)?: A Little Help needed moving to and from a bed to chair (including a wheelchair)?: A Little Help needed walking in hospital room?: A Lot Help needed climbing 3-5 steps with a railing? : A Lot 6 Click Score: 16    End of Session Equipment Utilized During Treatment: Gait belt Activity Tolerance: Patient limited by fatigue Patient left: in bed;with call bell/phone within reach;with bed alarm set   PT Visit Diagnosis: Adult, failure to thrive (R62.7);Muscle weakness (generalized) (M62.81)    Time: 0737-1062 PT Time Calculation (min) (ACUTE ONLY): 24 min   Charges:   PT Evaluation $PT Eval Low Complexity: 1 Low PT Treatments $Therapeutic Activity: 8-22 mins   PT G CodesKenyon Ana, PT Pager: [phone] 03/07/2017   Kenyon Ana 03/07/2017, 2:03 PM

## 2017-03-08 DIAGNOSIS — J84112 Idiopathic pulmonary fibrosis: Secondary | ICD-10-CM

## 2017-03-08 DIAGNOSIS — N059 Unspecified nephritic syndrome with unspecified morphologic changes: Secondary | ICD-10-CM

## 2017-03-08 LAB — CBC
HEMATOCRIT: 26.6 % — AB (ref 36.0–46.0)
Hemoglobin: 8.7 g/dL — ABNORMAL LOW (ref 12.0–15.0)
MCH: 31.1 pg (ref 26.0–34.0)
MCHC: 32.7 g/dL (ref 30.0–36.0)
MCV: 95 fL (ref 78.0–100.0)
Platelets: 219 10*3/uL (ref 150–400)
RBC: 2.8 MIL/uL — AB (ref 3.87–5.11)
RDW: 16.9 % — AB (ref 11.5–15.5)
WBC: 9.8 10*3/uL (ref 4.0–10.5)

## 2017-03-08 LAB — BASIC METABOLIC PANEL
Anion gap: 10 (ref 5–15)
BUN: 65 mg/dL — ABNORMAL HIGH (ref 6–20)
CALCIUM: 8.4 mg/dL — AB (ref 8.9–10.3)
CHLORIDE: 108 mmol/L (ref 101–111)
CO2: 22 mmol/L (ref 22–32)
CREATININE: 3.35 mg/dL — AB (ref 0.44–1.00)
GFR calc Af Amer: 15 mL/min — ABNORMAL LOW (ref >60)
GFR calc non Af Amer: 13 mL/min — ABNORMAL LOW (ref >60)
GLUCOSE: 251 mg/dL — AB (ref 65–99)
Potassium: 4.7 mmol/L (ref 3.5–5.1)
Sodium: 140 mmol/L (ref 135–145)

## 2017-03-08 LAB — PROTIME-INR
INR: 1.82
Prothrombin Time: 20.9 seconds — ABNORMAL HIGH (ref 11.4–15.2)

## 2017-03-08 LAB — GLUCOSE, CAPILLARY
GLUCOSE-CAPILLARY: 178 mg/dL — AB (ref 65–99)
Glucose-Capillary: 119 mg/dL — ABNORMAL HIGH (ref 65–99)
Glucose-Capillary: 272 mg/dL — ABNORMAL HIGH (ref 65–99)
Glucose-Capillary: 327 mg/dL — ABNORMAL HIGH (ref 65–99)

## 2017-03-08 LAB — MAGNESIUM: Magnesium: 2 mg/dL (ref 1.7–2.4)

## 2017-03-08 MED ORDER — DILTIAZEM HCL 60 MG PO TABS
60.0000 mg | ORAL_TABLET | Freq: Four times a day (QID) | ORAL | Status: DC
  Administered 2017-03-08 – 2017-03-09 (×4): 60 mg via ORAL
  Filled 2017-03-08 (×4): qty 1

## 2017-03-08 MED ORDER — DILTIAZEM HCL 30 MG PO TABS
45.0000 mg | ORAL_TABLET | Freq: Four times a day (QID) | ORAL | Status: DC
  Administered 2017-03-08: 45 mg via ORAL
  Filled 2017-03-08: qty 2

## 2017-03-08 MED ORDER — WARFARIN SODIUM 2.5 MG PO TABS
2.5000 mg | ORAL_TABLET | Freq: Once | ORAL | Status: AC
  Administered 2017-03-08: 2.5 mg via ORAL
  Filled 2017-03-08: qty 1

## 2017-03-08 NOTE — Progress Notes (Signed)
Assumed care of patient, agree with previous RN assessment. Patient stable, in no signs of distress, denies pain. HR and rhythm stable (NSR rate in 80's).  [name]. Brigitte Pulse, RN

## 2017-03-08 NOTE — Progress Notes (Signed)
Pt refusing insulin and some medications.

## 2017-03-08 NOTE — Progress Notes (Signed)
OT Cancellation Note  Patient Details Name: Jody Ruiz MRN: [id number] DOB: July 31, 1940   Cancelled Treatment:    Reason Eval/Treat Not Completed: Other (comment). Noted pt's HR jumping around.  RN unavailable to check with. Will check back tomorrow.  [name] 03/08/2017, 11:27 AM  Lesle Chris, OTR/L [phone] 03/08/2017

## 2017-03-08 NOTE — Progress Notes (Signed)
ANTICOAGULATION CONSULT NOTE -  Consult  Pharmacy Consult for warfarin Indication: atrial fibrillation  Allergies  Allergen Reactions  . Brilinta [Ticagrelor] Shortness Of Breath, Other (See Comments) and Cough    Reaction:  Fatigue  . Lisinopril Shortness Of Breath and Cough  . Statins Other (See Comments)    Reaction:  Muscle pain/weakness  . Zetia [Ezetimibe] Other (See Comments)    Reaction:  Muscle pain/weakness   . Macrodantin [Nitrofurantoin Macrocrystal] Rash  . Sulfa Antibiotics Rash    Patient Measurements: Height: 5' 7.01" (170.2 cm) Weight: 179 lb 10.8 oz (81.5 kg) IBW/kg (Calculated) : 61.62 Heparin Dosing Weight:   Vital Signs: Temp: 98.2 F (36.8 C) (09/02 0354) Temp Source: Oral (09/02 0354) BP: 141/86 (09/02 0800) Pulse Rate: 90 (09/02 0800)  Labs:  Recent Labs  03/05/17 1049 03/05/17 1427 03/06/17 0441 03/06/17 0951 03/07/17 0537 03/07/17 1908 03/08/17 0340  HGB 8.1*  --   --   --   --  8.3*  --   HCT 23.6*  --   --   --   --  25.2*  --   PLT 209  --   --   --   --  220  --   LABPROT  --   --  27.0*  --  23.3*  --  20.9*  INR  --   --  2.53  --  2.09  --  1.82  CREATININE  --   --  3.28*  --  3.12* 3.26*  --   CKTOTAL  --  49  --   --   --   --   --   TROPONINI  --   --   --  0.05*  --   --   --     Estimated Creatinine Clearance: 16.6 mL/min (A) (by C-G formula based on SCr of 3.26 mg/dL (H)).   Medications:  - Home warfarin: 1.25mg  MWF and 2.5mg  TTSS (last dose 8/28)  Assessment: Patient's a 76 y.o F with hx afib on warfarin PTA presented the ED on 03/04/17 with AMS.  Warfarin resumed on admission.  Today, 03/08/2017: - INR down and is sub-therapeutic at 1.82 - cbc stable - no bleeding documented - no significant drug-drug intxns   Goal of Therapy:  INR 2-3 Monitor platelets by anticoagulation protocol: Yes   Plan:  - warfarin 2.5 mg PO x1 today - monitor for s/s bleeding  Shantelle Alles P 03/08/2017,8:52 AM

## 2017-03-08 NOTE — Progress Notes (Signed)
Progress Note  Patient Name: Jody Ruiz Date of Encounter: 03/08/2017  Primary Cardiologist: Dr Johnsie Cancel  Subjective   Sleeping but rouses,  + nauseated,  Now in ICU for af with RVR overnight.  Refuses medicines overnight.  Inpatient Medications    Scheduled Meds: . aspirin EC  81 mg Oral Daily  . cyanocobalamin  1,000 mcg Subcutaneous Daily  . diltiazem  60 mg Oral Q6H  . famotidine  20 mg Oral Daily  . insulin aspart  0-5 Units Subcutaneous QHS  . insulin aspart  0-9 Units Subcutaneous TID WC  . predniSONE  40 mg Oral Q breakfast  . protein supplement  8 oz Oral BID  . saccharomyces boulardii  250 mg Oral BID  . sodium chloride flush  3 mL Intravenous Q12H  . Warfarin - Pharmacist Dosing Inpatient   Does not apply q1800   Continuous Infusions: . sodium chloride Stopped (03/06/17 1900)   PRN Meds: sodium chloride, acetaminophen **OR** acetaminophen, dicyclomine, nitroGLYCERIN, [DISCONTINUED] ondansetron **OR** ondansetron (ZOFRAN) IV, ondansetron, polyethylene glycol, polyvinyl alcohol, sodium chloride flush, traMADol   Vital Signs    Vitals:   03/08/17 0509 03/08/17 0600 03/08/17 0611 03/08/17 0759  BP:  128/73  133/78  Pulse: 88  82   Resp: 11  20   Temp:      TempSrc:      SpO2: 97%  98%   Weight:      Height:        Intake/Output Summary (Last 24 hours) at 03/08/17 6333 Last data filed at 03/07/17 2300  Gross per 24 hour  Intake             1480 ml  Output              300 ml  Net             1180 ml   Filed Weights   03/05/17 0842 03/05/17 1423 03/07/17 2015  Weight: 185 lb (83.9 kg) 179 lb 3.7 oz (81.3 kg) 179 lb 10.8 oz (81.5 kg)    Telemetry    afib with RVR, currently heart rates 102 - Personally Reviewed  Physical Exam   GEN- The patient is ill appearing, sleeping but rouses Head- normocephalic, atraumatic Eyes-  Sclera clear, conjunctiva pink Ears- hearing intact Oropharynx- clear Neck- supple, Lungs- Clear to ausculation  bilaterally, normal work of breathing Heart- tachycardic irregular rhythm  GI- soft, NT, ND, + BS Extremities- no clubbing, cyanosis, + dependant edema MS- no significant deformity or atrophy Skin- no rash or lesion Psych- euthymic mood, full affect Neuro- strength and sensation are intact   Labs    Chemistry Recent Labs Lab 03/02/17 0419 03/04/17 1313  03/06/17 0441 03/07/17 0537 03/07/17 1908  NA 139 139  < > 142 143 139  K 4.3 3.9  < > 3.8 4.2 5.0  CL 110 107  < > 110 111 108  CO2 22 20*  --  23 25 24   GLUCOSE 140* 133*  < > 124* 123* 269*  BUN 68* 72*  < > 69* 64* 66*  CREATININE 3.53* 3.50*  < > 3.28* 3.12* 3.26*  CALCIUM 8.3* 8.6*  --  8.3* 8.5* 8.3*  PROT  --  5.5*  --   --  5.3*  --   ALBUMIN 2.7* 3.0*  --   --  2.9*  --   AST  --  27  --   --  23  --   ALT  --  54  --   --  53  --   ALKPHOS  --  62  --   --  63  --   BILITOT  --  1.3*  --   --  1.1  --   GFRNONAA 12* 12*  --  13* 14* 13*  GFRAA 14* 14*  --  15* 16* 15*  ANIONGAP 7 12  --  9 7 7   < > = values in this interval not displayed.   Hematology Recent Labs Lab 03/04/17 1313 03/04/17 1325 03/05/17 1049 03/07/17 1908  WBC 10.2  --  9.9 8.3  RBC 2.68*  --  2.56* 2.67*  HGB 8.9* 8.5* 8.1* 8.3*  HCT 25.0* 25.0* 23.6* 25.2*  MCV 93.3  --  92.2 94.4  MCH 33.2  --  31.6 31.1  MCHC 35.6  --  34.3 32.9  RDW 17.6*  --  17.5* 17.1*  PLT 250  --  209 220    Cardiac Enzymes Recent Labs Lab 03/06/17 0951  TROPONINI 0.05*    Recent Labs Lab 03/04/17 1323  TROPIPOC 0.06       Patient Profile     76 y.o. female with rapidly progressive glomerulonephritis ANA vasculitis, pulmonary fibrosis, and marked recently clinical decline.  Cardiology is consulted for Afib with RVR.  Assessment & Plan    1. Persistent afib Tachycardic overnight and refusing medicines. Metoprolol has been switched to cardizem and she appears to be doing much better, though currently she is sleeping. I will increase  diltiazem to 60mg  Q6hours at this time. She does not have any AAD options and is also not an ablation candidate.  Continue rate control with oral diltiazem at this time.   2. Tachy/brady syndrome She has previously had sinus bradycardia.  Continue oral rate control for now.  If she converts to sinus, we will reassess rhythm at that time.  Given her poor clinical condition, I would like to avoid pacing if possible.  She is known to Dr Lovena Le and I will let him know that she is here.  3. HTN Stable No change required today  4. Code status Appreciate psychiatric input Primary team to discuss code status with her when awake (she told me yesterday that she would like to be DNI/DNR).  Palliative care consult would also seem very reasonable given her preferences.  General cardiology to follow with you  Thompson Grayer MD, [facility] 03/08/2017 8:07 AM

## 2017-03-08 NOTE — Progress Notes (Signed)
[facility] Progress Note  [name] JXB:[id number]   PCP: Burnard Bunting, MD DOB: 30-Aug-1940   DOA: 03/04/2017   DOS: 03/08/2017   Date of Service: the patient was seen and examined on 03/08/2017  Subjective: feeling better. Patient agreeing for physical therapy in the hospital but refusing for someone to come at home and also refusing SNF. Denies any chest pain and abdominal pain did have some nausea this morning but no vomiting. No abdominal pain passing gas.  Brief hospital course: Pt. with PMH of chronic A. 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 fib with RVR. Currently further plan is continue rate control.  Assessment and Plan: 1. A. fib with RVR. Heart rate still elevated. Received IV Cardizem drip and heart rate remained under control. Patient has reoccurrence of RVR multiple times, cardiology consulted as well as EP consulted. Not a candidate for atrial fibrillation ablation, or antiarrhythmic therapy due to her comorbidities recommend to continue rate control medications for now. Maintain K more than 4 med more than 2. TSH within normal limits. Mild elevation of troponin likely due to demand ischemia. Patient required to be transferred to step down on 03/07/2017 due to persistent tachycardia and hypotension. Now back in controlled rate. Would prefer the patient to remain on Cardizem instead of the Lopressor.Currently on 60 mg every 6 hours. Continue Coumadin per pharmacy.  2. Depression. Appreciate psychiatry input. Patient and family requested psychiatric consultation for capacity evaluation. Per psychiatry evaluation patient does have capacity to make her own medical decision. Psychiatry recommended Remeron although the patient has refused it for now.  3. Chronic kidney disease. ANA vasculitis. Concern for UTI. Patient presented with a fall. UA was  showing increased WBC but it also has increased, 7 epithelial cell therefore do not think that this is a clean sample. Patient does not have any fever or chills or leukocytosis to have a concern for UTI. Patient is on Rituxan for ANA vasculitis and a patient can continue with her regular dosing. Patient will follow up with nephrology as an outpatient.  4. Fall. No focal deficit, likely from generalized deconditioning from recurrent hospitalization. PT consult recommends no PT follow-up although patient required significant assistance, rolling walker and did not walk at all with physical therapy.patient refuses home health patient refuses SNF. We will arrange outpatient PT and OT as per discussion with patient and family.  5. idiopathic pulmonary fibrosis. Continue steroids, patient is currently taking 40 mg daily.  6. Essential hypertension. Continue current regimen.  7. Severe B-12 deficiency. Recommend patient to continue B-12 injections as an outpatient on a weekly basis.  8. Nausea and vomiting. medication induced. Monitor now. Minimize unnecessary medication.  9. Chronic diastolic CHF. Euvolemic for now.  patient was receiving IV fluids, will change it.  10 goals of care discussion. I had extensive discussion with patient yesterday before psychiatry evaluation as well as after psychiatry evaluation and this morning. Patient would like to continue full code for now. Patient wants to go home as soon as possible although when mention about barrier to go home, with her weakness requiring physical therapy; patient does not want home health and also does not want SNF. Patient was informed that without the use there is a high chance of her coming back to the hospital with deconditioning that she has. Would request proper recommendation from PT and OT regarding outpatient care. Does not want  palliative care discussion right now. not appearing to be a candidate for hospice right now as  well.  Diet: Cardiac diet DVT Prophylaxis: on therapeutic anticoagulation.  Advance goals of care discussion: full code  Family Communication: family was present at bedside, at the time of interview. The pt provided permission to discuss medical plan with the family. Opportunity was given to ask question and all questions were answered satisfactorily.   Disposition:  Discharge to be determined.  Consultants: EP, cardiology, psychiatry Procedures: none  Antibiotics: Anti-infectives    Start     Dose/Rate Route Frequency Ordered Stop   03/06/17 1300  cefTRIAXone (ROCEPHIN) 1 g in dextrose 5 % 50 mL IVPB  Status:  Discontinued     1 g 100 mL/hr over 30 Minutes Intravenous Every 24 hours 03/06/17 1212 03/06/17 1311       Objective: Physical Exam: Vitals:   03/08/17 0800 03/08/17 1100 03/08/17 1200 03/08/17 1250  BP: (!) 141/86 134/60  127/88  Pulse: 90 91    Resp: 15 14    Temp: 97.7 F (36.5 C)  (!) 97.5 F (36.4 C)   TempSrc: Oral  Oral   SpO2: 97% 96%    Weight:      Height:        Intake/Output Summary (Last 24 hours) at 03/08/17 1352 Last data filed at 03/08/17 0911  Gross per 24 hour  Intake             1363 ml  Output              300 ml  Net             1063 ml   Filed Weights   03/05/17 0842 03/05/17 1423 03/07/17 2015  Weight: 83.9 kg (185 lb) 81.3 kg (179 lb 3.7 oz) 81.5 kg (179 lb 10.8 oz)   General: Alert, Awake and Oriented to Time, Place and Person. Appear in mild distress, affect flat Eyes: PERRL, Conjunctiva normal ENT: Oral Mucosa clear moist. Neck: no JVD, no Abnormal Mass Or lumps Cardiovascular: S1 and S2 Present, no Murmur, Peripheral Pulses Present Respiratory: normal respiratory effort, Bilateral Air entry equal and Decreased, no use of accessory muscle, Clear to Auscultation, no Crackles, no wheezes Abdomen: Bowel Sound present, Soft and no tenderness, no hernia Skin: no redness, no Rash, no induration Extremities: trace Pedal edema, no  calf tenderness Neurologic: Grossly no focal neuro deficit. Bilaterally Equal motor strength  Data Reviewed: CBC:  Recent Labs Lab 03/02/17 0419 03/04/17 1313 03/04/17 1325 03/05/17 1049 03/07/17 1908  WBC  --  10.2  --  9.9 8.3  NEUTROABS  --  9.4*  --   --  7.6  HGB 8.6* 8.9* 8.5* 8.1* 8.3*  HCT 26.3* 25.0* 25.0* 23.6* 25.2*  MCV  --  93.3  --  92.2 94.4  PLT  --  250  --  209 983   Basic Metabolic Panel:  Recent Labs Lab 03/02/17 0419 03/04/17 1313 03/04/17 1325 03/06/17 0441 03/06/17 0951 03/07/17 0537 03/07/17 1908  NA 139 139 140 142  --  143 139  K 4.3 3.9 3.9 3.8  --  4.2 5.0  CL 110 107 107 110  --  111 108  CO2 22 20*  --  23  --  25 24  GLUCOSE 140* 133* 129* 124*  --  123* 269*  BUN 68* 72* 69* 69*  --  64* 66*  CREATININE 3.53* 3.50* 3.50* 3.28*  --  3.12* 3.26*  CALCIUM 8.3* 8.6*  --  8.3*  --  8.5* 8.3*  MG  --   --   --   --  1.6*  --  2.0  PHOS 5.1*  --   --   --   --   --   --     Liver Function Tests:  Recent Labs Lab 03/02/17 0419 03/04/17 1313 03/07/17 0537  AST  --  27 23  ALT  --  54 53  ALKPHOS  --  62 63  BILITOT  --  1.3* 1.1  PROT  --  5.5* 5.3*  ALBUMIN 2.7* 3.0* 2.9*   No results for input(s): LIPASE, AMYLASE in the last 168 hours. No results for input(s): AMMONIA in the last 168 hours. Coagulation Profile:  Recent Labs Lab 03/04/17 1313 03/05/17 0635 03/06/17 0441 03/07/17 0537 03/08/17 0340  INR 2.25 2.44 2.53 2.09 1.82   Cardiac Enzymes:  Recent Labs Lab 03/05/17 1427 03/06/17 0951  CKTOTAL 49  --   TROPONINI  --  0.05*   BNP (last 3 results) No results for input(s): PROBNP in the last 8760 hours. CBG:  Recent Labs Lab 03/07/17 1208 03/07/17 1708 03/07/17 2118 03/08/17 0737 03/08/17 1221  GLUCAP 229* 243* 200* 119* 178*   Studies: No results found.  Scheduled Meds: . aspirin EC  81 mg Oral Daily  . cyanocobalamin  1,000 mcg Subcutaneous Daily  . diltiazem  60 mg Oral Q6H  . famotidine  20  mg Oral Daily  . insulin aspart  0-5 Units Subcutaneous QHS  . insulin aspart  0-9 Units Subcutaneous TID WC  . predniSONE  40 mg Oral Q breakfast  . protein supplement  8 oz Oral BID  . saccharomyces boulardii  250 mg Oral BID  . sodium chloride flush  3 mL Intravenous Q12H  . warfarin  2.5 mg Oral ONCE-1800  . Warfarin - Pharmacist Dosing Inpatient   Does not apply q1800   Continuous Infusions: . sodium chloride Stopped (03/06/17 1900)   PRN Meds: sodium chloride, acetaminophen **OR** acetaminophen, nitroGLYCERIN, [DISCONTINUED] ondansetron **OR** ondansetron (ZOFRAN) IV, ondansetron, polyethylene glycol, polyvinyl alcohol, sodium chloride flush, traMADol  Time spent: 35 minutes  Author: Berle Mull, MD [facility] Pager: [phone] 03/08/2017 1:52 PM  If 7PM-7AM, please contact night-coverage at [email or web address], password [facility]

## 2017-03-09 LAB — GLUCOSE, CAPILLARY
GLUCOSE-CAPILLARY: 224 mg/dL — AB (ref 65–99)
Glucose-Capillary: 125 mg/dL — ABNORMAL HIGH (ref 65–99)
Glucose-Capillary: 183 mg/dL — ABNORMAL HIGH (ref 65–99)

## 2017-03-09 LAB — URINE CULTURE: Culture: 20000 — AB

## 2017-03-09 LAB — CBC
HEMATOCRIT: 25.7 % — AB (ref 36.0–46.0)
HEMOGLOBIN: 8.5 g/dL — AB (ref 12.0–15.0)
MCH: 30.5 pg (ref 26.0–34.0)
MCHC: 33.1 g/dL (ref 30.0–36.0)
MCV: 92.1 fL (ref 78.0–100.0)
Platelets: 194 10*3/uL (ref 150–400)
RBC: 2.79 MIL/uL — ABNORMAL LOW (ref 3.87–5.11)
RDW: 16.3 % — AB (ref 11.5–15.5)
WBC: 8.9 10*3/uL (ref 4.0–10.5)

## 2017-03-09 LAB — BASIC METABOLIC PANEL
ANION GAP: 8 (ref 5–15)
BUN: 67 mg/dL — AB (ref 6–20)
CHLORIDE: 110 mmol/L (ref 101–111)
CO2: 24 mmol/L (ref 22–32)
Calcium: 8.5 mg/dL — ABNORMAL LOW (ref 8.9–10.3)
Creatinine, Ser: 3.22 mg/dL — ABNORMAL HIGH (ref 0.44–1.00)
GFR calc Af Amer: 15 mL/min — ABNORMAL LOW (ref >60)
GFR calc non Af Amer: 13 mL/min — ABNORMAL LOW (ref >60)
GLUCOSE: 146 mg/dL — AB (ref 65–99)
POTASSIUM: 4.6 mmol/L (ref 3.5–5.1)
SODIUM: 142 mmol/L (ref 135–145)

## 2017-03-09 LAB — PROTIME-INR
INR: 2.14
Prothrombin Time: 23.7 seconds — ABNORMAL HIGH (ref 11.4–15.2)

## 2017-03-09 MED ORDER — DIPHENHYDRAMINE HCL 25 MG PO CAPS: 25.0000 mg | ORAL_CAPSULE | Freq: Once | ORAL | Status: DC

## 2017-03-09 MED ORDER — RITUXIMAB CHEMO INJECTION 500 MG/50ML
375.0000 mg/m2 | Freq: Once | INTRAVENOUS | Status: AC
  Administered 2017-03-10: 700 mg via INTRAVENOUS
  Filled 2017-03-09: qty 50

## 2017-03-09 MED ORDER — CIPROFLOXACIN HCL 500 MG PO TABS
500.0000 mg | ORAL_TABLET | ORAL | Status: AC
  Filled 2017-03-09: qty 1

## 2017-03-09 MED ORDER — DILTIAZEM HCL ER COATED BEADS 240 MG PO CP24
240.0000 mg | ORAL_CAPSULE | Freq: Every day | ORAL | Status: DC
  Administered 2017-03-09 – 2017-03-10 (×2): 240 mg via ORAL
  Filled 2017-03-09 (×2): qty 1

## 2017-03-09 MED ORDER — SODIUM CHLORIDE 0.9 % IV SOLN
375.0000 mg/m2 | Freq: Once | INTRAVENOUS | Status: DC
  Filled 2017-03-09: qty 70

## 2017-03-09 MED ORDER — ACETAMINOPHEN 325 MG PO TABS: 650.0000 mg | ORAL_TABLET | Freq: Once | ORAL | Status: DC

## 2017-03-09 MED ORDER — DIPHENHYDRAMINE HCL 25 MG PO CAPS
25.0000 mg | ORAL_CAPSULE | Freq: Once | ORAL | Status: AC
  Administered 2017-03-10: 25 mg via ORAL
  Filled 2017-03-09: qty 1

## 2017-03-09 MED ORDER — CIPROFLOXACIN HCL 500 MG PO TABS: 500.0000 mg | ORAL_TABLET | Freq: Every day | ORAL | Status: DC

## 2017-03-09 MED ORDER — ACETAMINOPHEN 325 MG PO TABS
650.0000 mg | ORAL_TABLET | Freq: Once | ORAL | Status: AC
  Administered 2017-03-10: 650 mg via ORAL
  Filled 2017-03-09: qty 2

## 2017-03-09 MED ORDER — WARFARIN SODIUM 2.5 MG PO TABS
1.2500 mg | ORAL_TABLET | Freq: Once | ORAL | Status: AC
  Administered 2017-03-09: 1.25 mg via ORAL
  Filled 2017-03-09: qty 0.5

## 2017-03-09 NOTE — Progress Notes (Signed)
[facility] Progress Note  [name] HUD:[id number]   PCP: Burnard Bunting, MD DOB: January 08, 1941   DOA: 03/04/2017   DOS: 03/09/2017   Date of Service: the patient was seen and examined on 03/09/2017  Subjective: feeling better. No nausea or vomiting, no fever or chills.   Brief hospital course: Pt. with PMH of chronic A. 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 fib with RVR. Currently further plan is continue rate control.  Assessment and Plan: 1. A. fib with RVR. Heart rate still elevated. Received IV Cardizem drip and heart rate remained under control. Patient has reoccurrence of RVR multiple times, cardiology consulted as well as EP consulted. Not a candidate for atrial fibrillation ablation, or antiarrhythmic therapy due to her comorbidities recommend to continue rate control medications for now. Maintain K more than 4 med more than 2. TSH within normal limits. Mild elevation of troponin likely due to demand ischemia. Patient required to be transferred to step down on 03/07/2017 due to persistent tachycardia and hypotension. Now back in controlled rate. Would prefer the patient to remain on Cardizem instead of the Lopressor. Change cardizem to 240mg  daily dose.  2. Depression. Appreciate psychiatry input. Patient and family requested psychiatric consultation for capacity evaluation. Per psychiatry evaluation patient does have capacity to make her own medical decision. Psychiatry recommended Remeron although the patient has refused it for now.  3. Chronic kidney disease. ANA vasculitis. Concern for UTI. Patient presented with a fall. UA was showing increased WBC but it also has increased, 7 epithelial cell therefore do not think that this is a clean sample. Patient does not have any fever or chills or leukocytosis to have a concern for UTI. Culture are growing  20000 colonies, per nephrology request will treat with cipro since the pt will be on immunosuppressive meds.  Patient is on Rituxan for ANA vasculitis and a patient can continue with her regular dosing. Appreciate nephrology input.   4. Fall. No focal deficit, likely from generalized deconditioning from recurrent hospitalization. PT consult recommends no PT follow-up although patient required significant assistance, rolling walker and did not walk at all with physical therapy.patient refuses home health patient refuses SNF. We will arrange outpatient PT and OT as per discussion with patient and family.  5. idiopathic pulmonary fibrosis. Continue steroids, patient is currently taking 40 mg daily.  6. Essential hypertension. Continue current regimen.  7. Severe B-12 deficiency. Recommend patient to continue B-12 injections as an outpatient on a weekly basis.  8. Nausea and vomiting. medication induced. Monitor now. Minimize unnecessary medication.  9. Chronic diastolic CHF. Euvolemic for now.  patient was receiving IV fluids, will change it.  10 goals of care discussion. I had extensive discussion with patient yesterday before psychiatry evaluation as well as after psychiatry evaluation and this morning. Patient would like to continue full code for now. Patient wants to go home as soon as possible although when mention about barrier to go home, with her weakness requiring physical therapy; patient does not want home health and also does not want SNF. Patient was informed that without the use there is a high chance of her coming back to the hospital with deconditioning that she has. Would request proper recommendation from PT and OT regarding outpatient care. Does not want palliative care discussion right now. not appearing to be a candidate for hospice right now as well.  Diet: Cardiac  diet DVT Prophylaxis: on therapeutic anticoagulation.  Advance goals of care discussion: full  code  Family Communication: family was present at bedside, at the time of interview. The pt provided permission to discuss medical plan with the family. Opportunity was given to ask question and all questions were answered satisfactorily.   Disposition:  Discharge home tomorrow.  Consultants: EP, cardiology, psychiatry, nephrology  Procedures: none  Antibiotics: Anti-infectives    Start     Dose/Rate Route Frequency Ordered Stop   03/10/17 0800  ciprofloxacin (CIPRO) tablet 500 mg  Status:  Discontinued     500 mg Oral Daily with breakfast 03/09/17 1108 03/09/17 1227   03/09/17 2000  ciprofloxacin (CIPRO) tablet 500 mg     500 mg Oral Every 24 hours 03/09/17 1227 03/12/17 1959   03/06/17 1300  cefTRIAXone (ROCEPHIN) 1 g in dextrose 5 % 50 mL IVPB  Status:  Discontinued     1 g 100 mL/hr over 30 Minutes Intravenous Every 24 hours 03/06/17 1212 03/06/17 1311       Objective: Physical Exam: Vitals:   03/08/17 2019 03/09/17 0447 03/09/17 1055 03/09/17 1407  BP: 118/66 131/69 133/69 120/66  Pulse: 99 92  95  Resp: 12 14  16   Temp: 98.2 F (36.8 C) 98.2 F (36.8 C)  98.8 F (37.1 C)  TempSrc: Oral Oral  Oral  SpO2: 98% 98%  100%  Weight:      Height:        Intake/Output Summary (Last 24 hours) at 03/09/17 1619 Last data filed at 03/09/17 1300  Gross per 24 hour  Intake              363 ml  Output             1600 ml  Net            -1237 ml   Filed Weights   03/05/17 0842 03/05/17 1423 03/07/17 2015  Weight: 83.9 kg (185 lb) 81.3 kg (179 lb 3.7 oz) 81.5 kg (179 lb 10.8 oz)   General: Alert, Awake and Oriented to Time, Place and Person. Appear in mild distress, affect flat Eyes: PERRL, Conjunctiva normal ENT: Oral Mucosa clear moist. Neck: no JVD, no Abnormal Mass Or lumps Cardiovascular: S1 and S2 Present, no Murmur, Peripheral Pulses Present Respiratory: normal respiratory effort, Bilateral Air entry equal and Decreased, no use of accessory muscle, Clear to  Auscultation, no Crackles, no wheezes Abdomen: Bowel Sound present, Soft and no tenderness, no hernia Skin: no redness, no Rash, no induration Extremities: trace Pedal edema, no calf tenderness Neurologic: Grossly no focal neuro deficit. Bilaterally Equal motor strength  Data Reviewed: CBC:  Recent Labs Lab 03/04/17 1313 03/04/17 1325 03/05/17 1049 03/07/17 1908 03/08/17 1504 03/09/17 0439  WBC 10.2  --  9.9 8.3 9.8 8.9  NEUTROABS 9.4*  --   --  7.6  --   --   HGB 8.9* 8.5* 8.1* 8.3* 8.7* 8.5*  HCT 25.0* 25.0* 23.6* 25.2* 26.6* 25.7*  MCV 93.3  --  92.2 94.4 95.0 92.1  PLT 250  --  209 220 219 782   Basic Metabolic Panel:  Recent Labs Lab 03/06/17 0441 03/06/17 0951 03/07/17 0537 03/07/17 1908 03/08/17 1504 03/09/17 0439  NA 142  --  143 139 140 142  K 3.8  --  4.2 5.0 4.7 4.6  CL 110  --  111 108 108 110  CO2 23  --  25 24 22 24   GLUCOSE 124*  --  123* 269* 251* 146*  BUN 69*  --  64* 66* 65* 67*  CREATININE 3.28*  --  3.12* 3.26* 3.35* 3.22*  CALCIUM 8.3*  --  8.5* 8.3* 8.4* 8.5*  MG  --  1.6*  --  2.0 2.0  --     Liver Function Tests:  Recent Labs Lab 03/04/17 1313 03/07/17 0537  AST 27 23  ALT 54 53  ALKPHOS 62 63  BILITOT 1.3* 1.1  PROT 5.5* 5.3*  ALBUMIN 3.0* 2.9*   No results for input(s): LIPASE, AMYLASE in the last 168 hours. No results for input(s): AMMONIA in the last 168 hours. Coagulation Profile:  Recent Labs Lab 03/05/17 0635 03/06/17 0441 03/07/17 0537 03/08/17 0340 03/09/17 0439  INR 2.44 2.53 2.09 1.82 2.14   Cardiac Enzymes:  Recent Labs Lab 03/05/17 1427 03/06/17 0951  CKTOTAL 49  --   TROPONINI  --  0.05*   BNP (last 3 results) No results for input(s): PROBNP in the last 8760 hours. CBG:  Recent Labs Lab 03/08/17 1221 03/08/17 1716 03/08/17 2100 03/09/17 0751 03/09/17 1151  GLUCAP 178* 272* 327* 125* 183*   Studies: No results found.  Scheduled Meds: . [START ON 03/10/2017] acetaminophen  650 mg Oral  Once  . aspirin EC  81 mg Oral Daily  . ciprofloxacin  500 mg Oral Q24H  . diltiazem  240 mg Oral Daily  . [START ON 03/10/2017] diphenhydrAMINE  25 mg Oral Once  . famotidine  20 mg Oral Daily  . insulin aspart  0-5 Units Subcutaneous QHS  . insulin aspart  0-9 Units Subcutaneous TID WC  . predniSONE  40 mg Oral Q breakfast  . protein supplement  8 oz Oral BID  . [START ON 03/10/2017] riTUXimab (RITUXAN) IV infusion  375 mg/m2 Intravenous Once  . saccharomyces boulardii  250 mg Oral BID  . sodium chloride flush  3 mL Intravenous Q12H  . warfarin  1.25 mg Oral ONCE-1800  . Warfarin - Pharmacist Dosing Inpatient   Does not apply q1800   Continuous Infusions: . sodium chloride Stopped (03/06/17 1900)   PRN Meds: sodium chloride, acetaminophen **OR** acetaminophen, nitroGLYCERIN, [DISCONTINUED] ondansetron **OR** ondansetron (ZOFRAN) IV, ondansetron, polyethylene glycol, polyvinyl alcohol, sodium chloride flush, traMADol  Time spent: 35 minutes  Author: Berle Mull, MD [facility] Pager: [phone] 03/09/2017 4:19 PM  If 7PM-7AM, please contact night-coverage at [email or web address], password [facility]

## 2017-03-09 NOTE — Progress Notes (Signed)
Discussed use of antibiotic, Ciprofloxacin. Pt stated that she will refuse to take it tonight.

## 2017-03-09 NOTE — Progress Notes (Signed)
[place] for warfarin Indication: atrial fibrillation  Allergies  Allergen Reactions  . Brilinta [Ticagrelor] Shortness Of Breath, Other (See Comments) and Cough    Reaction:  Fatigue  . Lisinopril Shortness Of Breath and Cough  . Statins Other (See Comments)    Reaction:  Muscle pain/weakness  . Zetia [Ezetimibe] Other (See Comments)    Reaction:  Muscle pain/weakness   . Macrodantin [Nitrofurantoin Macrocrystal] Rash  . Sulfa Antibiotics Rash    Patient Measurements: Height: 5' 7.01" (170.2 cm) Weight: 179 lb 10.8 oz (81.5 kg) IBW/kg (Calculated) : 61.62 Heparin Dosing Weight:   Vital Signs: Temp: 98.2 F (36.8 C) (09/03 0447) Temp Source: Oral (09/03 0447) BP: 131/69 (09/03 0447) Pulse Rate: 92 (09/03 0447)  Labs:  Recent Labs  03/06/17 0951  03/07/17 0537  03/07/17 1908 03/08/17 0340 03/08/17 1504 03/09/17 0439  HGB  --   --   --   < > 8.3*  --  8.7* 8.5*  HCT  --   --   --   --  25.2*  --  26.6* 25.7*  PLT  --   --   --   --  220  --  219 194  LABPROT  --   --  23.3*  --   --  20.9*  --  23.7*  INR  --   --  2.09  --   --  1.82  --  2.14  CREATININE  --   < > 3.12*  --  3.26*  --  3.35* 3.22*  TROPONINI 0.05*  --   --   --   --   --   --   --   < > = values in this interval not displayed.  Estimated Creatinine Clearance: 16.8 mL/min (A) (by C-G formula based on SCr of 3.22 mg/dL (H)).   Medications:  - Home warfarin: 1.25mg  MWF and 2.5mg  TTSS (last dose 8/28)  Assessment: Patient's a 76 y.o F with hx afib on warfarin PTA presented the ED on 03/04/17 with AMS.  Warfarin resumed on admission. Note PMH CAD s/p recent stents, on ASA.  Today, 03/09/2017: - INR now therapeutic - cbc stable - no bleeding documented - no significant drug-drug intxns (on ASA)   Goal of Therapy:  INR 2-3 Monitor platelets by anticoagulation protocol: Yes   Plan:  - warfarin 1.25 mg PO x1 today - monitor for s/s bleeding  Reuel Boom, PharmD, BCPS Pager: [phone] 03/09/2017, 9:37 AM

## 2017-03-09 NOTE — Consult Note (Addendum)
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  She didn't tolerate po Cytoxan due to severe nausea.  She has had 1 of 4 induction treatments with Rituximab with the first given on Aug 25th. She tolerated it well.  She has also recently had episodes of syncope felt to be related to dehydration and/or uncontrolled afib.  She was admitted on 8/29 with gen'd weakness and afib with RVR.  Was started on IV diltiazem with good response and has been changed over to po diltiazem.  Her home metoprolol has been dc'd.  Trop's were negative and no CP.  We are asked to see her regarding her induction Rx for ANCA GN.    Patient is w/o complaints, denies any SOB, CP, n/v/d, abd pain or ankle swelling.    Creat is 3.0- 3.3 range, it was 5.8 on presentation in July.  eGFR now is 15%, was 8% at presentation.  She is making urine in good amounts and hasn't required any diuretics.    She had urine cx done (UA +wbc/rbcs) here which grew 20K of citrobacter and 20K of Klebsiella.  We recommended abx today but she if refusing, says it was a "contaminated" specimen.      Home meds > Metoprolol 25 tid, norvasc 5, warfarin,  pred 40/d, pepcid, florastor, vitamins, zofran, Imdur, asa  ROS  denies CP  no joint pain   no HA  no blurry vision  no rash  no diarrhea  no nausea/ vomiting  no dysuria  no difficulty voiding  no change in urine color    Past Medical History  Past Medical History:  Diagnosis Date  . Arthritis   . Atrial fibrillation ([place])   . Borderline hypothyroidism   . CAD (coronary artery disease)    a. 04/2012 NSTEMI/Cath:  pLAD 90%, mLAD 70-80% (long) (3x16 & 2.5x28 Promus DES to p/m LAD), pD1 70-80%, pRI 30%  (small), CFX 20%, OM1 40%, mOM2 30%, pRCA 30%, PDA 30-40%, pPLB 70%, then mid 90%, EF 65%;  b. 03/2013 Abnl CL w/apical isch;  c. 04/2013 Cath: LM nl, LAD patent stents, LCX <20, RCA 30p PDA 40-50 EF 55-60% ->Cont Med Rx.  c. CP s/p LHC with stable dz and patent stents--> Rx medically   . Cataract   . Dyspnea    a. CP and SOB 06/2012 => Ticagrelor changed to Plavix  . History of Clostridium difficile infection   . HLD (hyperlipidemia)    intolerant to statins  . Hx of adenomatous colonic polyps   . Hx of echocardiogram    a. Echo 2/14:  mild LVH, EF 55-65%, Gr 1 diast dysfn, mild LAE  . Hypertension   . Idiopathic pulmonary fibrosis   . Internal hemorrhoids   . Lung nodules   . Microscopic colitis   . Myocardial infarction ([place]) 2013  . Obesity   . OSA (obstructive sleep apnea)    a. on CPAP  . Sleep apnea    cpap   Past Surgical History  Past Surgical History:  Procedure Laterality Date  . APPENDECTOMY  1994  . BREAST BIOPSY  1965, 1975   bilateral  . CARDIAC CATHETERIZATION     x4  . CHOLECYSTECTOMY  2004  . CORONARY STENT PLACEMENT  04/2012   x2  . KNEE ARTHROSCOPY  2000   bilateral  . LEFT AND RIGHT HEART CATHETERIZATION WITH CORONARY ANGIOGRAM N/A 08/02/2014   Procedure: LEFT AND RIGHT HEART CATHETERIZATION WITH CORONARY ANGIOGRAM;  Surgeon: Kathleene Hazel, MD;  Location: [facility] CATH LAB;  Service: Cardiovascular;  Laterality: N/A;  . LEFT HEART CATHETERIZATION WITH CORONARY ANGIOGRAM N/A 04/29/2012   Procedure: LEFT HEART CATHETERIZATION WITH CORONARY ANGIOGRAM;  Surgeon: Peter M Swaziland, MD;  Location: [facility] CATH LAB;  Service: Cardiovascular;  Laterality: N/A;  . LEFT HEART CATHETERIZATION WITH CORONARY ANGIOGRAM N/A 08/03/2012   Procedure: LEFT HEART CATHETERIZATION WITH CORONARY ANGIOGRAM;  Surgeon: Kathleene Hazel, MD;  Location: [facility] CATH LAB;  Service: Cardiovascular;  Laterality: N/A;  . LEFT HEART CATHETERIZATION WITH CORONARY ANGIOGRAM N/A 04/12/2013    Procedure: LEFT HEART CATHETERIZATION WITH CORONARY ANGIOGRAM;  Surgeon: Peter M Swaziland, MD;  Location: [facility] CATH LAB;  Service: Cardiovascular;  Laterality: N/A;  . PERCUTANEOUS CORONARY STENT INTERVENTION (PCI-S) N/A 04/29/2012   Procedure: PERCUTANEOUS CORONARY STENT INTERVENTION (PCI-S);  Surgeon: Peter M Swaziland, MD;  Location: [facility] CATH LAB;  Service: Cardiovascular;  Laterality: N/A;  . SHOULDER ARTHROSCOPY  2012   left  . vaginal polypectomy  2004  . VENTRAL HERNIA REPAIR  2005   Family History  Family History  Problem Relation Age of Onset  . Breast cancer Sister   . Prostate cancer Father   . Colon polyps Father   . Heart disease Father 60       CAD, died of ESRD  . Kidney disease Father   . Colon cancer Sister 8  . Heart disease Mother 32       Died acute MI  . Heart disease Brother 50       CABG  . Esophageal cancer Neg Hx   . Rectal cancer Neg Hx   . Stomach cancer Neg Hx    Social History  reports that she has never smoked. She has never used smokeless tobacco. She reports that she does not drink alcohol or use drugs. Allergies  Allergies  Allergen Reactions  . Brilinta [Ticagrelor] Shortness Of Breath, Other (See Comments) and Cough    Reaction:  Fatigue  . Lisinopril Shortness Of Breath and Cough  . Statins Other (See Comments)    Reaction:  Muscle pain/weakness  . Zetia [Ezetimibe] Other (See Comments)    Reaction:  Muscle pain/weakness   . Macrodantin [Nitrofurantoin Macrocrystal] Rash  . Sulfa Antibiotics Rash   Home medications Prior to Admission medications   Medication Sig Start Date End Date Taking? Authorizing Provider  amLODipine (NORVASC) 5 MG tablet Take 5 mg by mouth daily before breakfast.    Yes [provider]  aspirin EC 81 MG tablet Take 81 mg by mouth daily before breakfast.    Yes [provider]  dicyclomine (BENTYL) 10 MG capsule Take 1 capsule (10 mg total) by mouth 3 (three) times daily as needed for spasms. 01/21/17   Yes Regalado, Belkys A, MD  isosorbide mononitrate (IMDUR) 60 MG 24 hr tablet Take 60 mg by mouth at bedtime.   Yes [provider]  metoprolol tartrate (LOPRESSOR) 25 MG tablet Take 1 tablet (25 mg total) by mouth 3 (three) times daily. Patient taking differently: Take 25 mg by mouth 3 (three) times daily. Takes with breakfast, supper, and bedtime 03/02/17  Yes Rodolph Bong, MD  nitroGLYCERIN (NITROSTAT) 0.4 MG SL tablet Place 1 tablet (0.4  mg total) under the tongue every 5 (five) minutes x 3 doses as needed for chest pain. 08/03/14  Yes Eileen Stanford, PA-C  ondansetron (ZOFRAN) 4 MG tablet Take 1 tablet (4 mg total) by mouth every 8 (eight) hours as needed for nausea or vomiting. 01/08/17  Yes Irene Shipper, MD  polyvinyl alcohol (LIQUIFILM TEARS) 1.4 % ophthalmic solution Place 1 drop into both eyes 3 (three) times daily as needed for dry eyes.   Yes [provider]  predniSONE (DELTASONE) 10 MG tablet Take 5-50 mg by mouth daily. '50mg'$  daily for 2 weeks, '40mg'$  for 2 weeks, '30mg'$  for 2 weeks, '20mg'$  for 2 weeks, '10mg'$  for 2 weeks, 5 mg for 2 weeks, then '5mg'$  everyother day for 2 weeks. Then stop. Start date was 02/17/17. [place] to confirm taper instructions. Start new taper on Tuesday's 02/13/17  Yes [provider]  RiTUXimab (RITUXAN IV) Inject into the vein. [employer]. Next Dose 03/10/17. Dr. Hollie Salk Prescibing Dr. Maryjane Hurter dose 02/28/17   Yes [provider]  vitamin B-12 (CYANOCOBALAMIN) 1000 MCG tablet Take 1 tablet (1,000 mcg total) by mouth daily. Patient taking differently: Take 1,000 mcg by mouth daily with lunch.  03/02/17  Yes Eugenie Filler, MD  warfarin (COUMADIN) 2.5 MG tablet Take 0.5-1 tablets (1.25-2.5 mg total) by mouth See admin instructions. Take 1.'25mg'$  (1/2 tablet) by mouth on Monday, Wednesday, and Friday.  Take 1 tablet (2.'5mg'$ ) by mouth on Tuesday, Thursday, Saturday, and sunday Patient taking differently: Take  1.25-2.5 mg by mouth See admin instructions. 1.25 mg on Monday, Wednesday and Friday. 2.5 mg on Tuesday, Thursday, Saturday and Sunday. 03/02/17  Yes Eugenie Filler, MD  famotidine (PEPCID) 20 MG tablet Take 1 tablet (20 mg total) by mouth 2 (two) times daily. Patient not taking: Reported on 03/04/2017 01/21/17   Niel Hummer A, MD  protein supplement (UNJURY CHICKEN SOUP) POWD Take 7 g (2 oz total) by mouth 2 (two) times daily with a meal. Patient not taking: Reported on 03/04/2017 03/02/17   Eugenie Filler, MD  saccharomyces boulardii (FLORASTOR) 250 MG capsule Take 1 capsule (250 mg total) by mouth 2 (two) times daily. Patient not taking: Reported on 03/04/2017 01/21/17   Elmarie Shiley, MD   Liver Function Tests  Recent Labs Lab 03/04/17 1313 03/07/17 0537  AST 27 23  ALT 54 53  ALKPHOS 62 63  BILITOT 1.3* 1.1  PROT 5.5* 5.3*  ALBUMIN 3.0* 2.9*   No results for input(s): LIPASE, AMYLASE in the last 168 hours. CBC  Recent Labs Lab 03/04/17 1313  03/07/17 1908 03/08/17 1504 03/09/17 0439  WBC 10.2  < > 8.3 9.8 8.9  NEUTROABS 9.4*  --  7.6  --   --   HGB 8.9*  < > 8.3* 8.7* 8.5*  HCT 25.0*  < > 25.2* 26.6* 25.7*  MCV 93.3  < > 94.4 95.0 92.1  PLT 250  < > 220 219 194  < > = values in this interval not displayed. Basic Metabolic Panel  Recent Labs Lab 03/04/17 1313 03/04/17 1325 03/06/17 0441 03/07/17 0537 03/07/17 1908 03/08/17 1504 03/09/17 0439  NA 139 140 142 143 139 140 142  K 3.9 3.9 3.8 4.2 5.0 4.7 4.6  CL 107 107 110 111 108 108 110  CO2 20*  --  '23 25 24 22 24  '$ GLUCOSE 133* 129* 124* 123* 269* 251* 146*  BUN 72* 69* 69* 64* 66* 65* 67*  CREATININE  3.50* 3.50* 3.28* 3.12* 3.26* 3.35* 3.22*  CALCIUM 8.6*  --  8.3* 8.5* 8.3* 8.4* 8.5*   Iron/TIBC/Ferritin/ %Sat    Component Value Date/Time   IRON 109 02/27/2017 1230   TIBC 255 02/27/2017 1230   FERRITIN 877 (H) 02/27/2017 1230   IRONPCTSAT 43 (H) 02/27/2017 1230    Vitals:   03/08/17  1400 03/08/17 2019 03/09/17 0447 03/09/17 1055  BP: 113/66 118/66 131/69 133/69  Pulse: 86 99 92   Resp: '11 12 14   '$ Temp:  98.2 F (36.8 C) 98.2 F (36.8 C)   TempSrc:  Oral Oral   SpO2: 95% 98% 98%   Weight:      Height:       Exam Gen alert, no distress No rash, cyanosis or gangrene Sclera anicteric, throat clear  No jvd or bruits Chest clear bilat RRR no MRG Abd soft ntnd no mass or ascites +bs GU defer MS no joint effusions or deformity Ext no LE edema / no wounds or ulcers Neuro is alert, Ox 3 , nf      Impression: 1.  ANCA GN / CKD stage IV - by biopsy 7/18 with 50% fibrocellular crescents and minimal IS fibrosis. Due for next Rituxan tomorrow.  Have d/w pt and her family.  Patient is anxious to proceed with her Rituxan Rx.  I explained that she has had some cardiac instability of late and that she would be at potentially increased risk of side effects from Rituxan, and I offered a less aggressive approach of po Imuran (used for maintenance, not induction), however, the patient wants aggressive therapy and wants to proceed with Rituxan therapy specifically.  At this point she has been seen by cardiology. Have d/w Dr Hollie Salk her primary renal MD and in our opinion is as stable as she will be from a cardiac standpoint w/ controlled afib rate and no other options offered by cardiology (not a candidate for ablation/ antiarrhythmics). I have discussed possible side effects with patient (including infusion reactions, hep B reactivation, PML, infections, death, others) .  Pt agrees to proceed, daughter present.  Will proceed with Rituxan 2nd dose tomorrow. 2.  UTI - possible UTI , low counts , we recommended abx but pt is refusing.   3.  HTN 4.  Vol is euvolemic 5.  Afib with RVR - on po dilt now   6.  IDDM    Plan - as above.   Kelly Splinter MD [employer] pager [phone]   03/09/2017, 12:20 PM

## 2017-03-09 NOTE — Evaluation (Signed)
Occupational Therapy Evaluation Patient Details Name: Jody Ruiz MRN: [id number] DOB: December 08, 1940 Today's Date: 03/09/2017    History of Present Illness Jody Ruiz is a 76 y.o. female with a h/o recent clinical decline.  She has been diagnosed with rapidly progressive glomerulonephritis ANA vasculitis and has required steroids and rituximab which she has not well tolerated.  She also CAD s/p prior PCI and interstitial lung disease.  She is admitted with afib with RVR.  Of note, she has recently also had sinus bradycardia and pauses of 3.3 seconds with afib termination.   Clinical Impression   Pt admitted with clinical decline. Pt currently with functional limitations due to the deficits listed below (see OT Problem List). Pt will benefit from skilled OT to increase their safety and independence with ADL and functional mobility for ADL to facilitate discharge to venue listed below.      Follow Up Recommendations  Home health OT;SNF;Supervision/Assistance - 24 hour          Precautions / Restrictions Precautions Precautions: Fall Restrictions Weight Bearing Restrictions: No      Mobility Bed Mobility Overal bed mobility: Needs Assistance Bed Mobility: Supine to Sit;Sit to Supine     Supine to sit: Supervision Sit to supine: Supervision   General bed mobility comments: for safety, HOB flat  Transfers Overall transfer level: Needs assistance Equipment used: Rolling walker (2 wheeled)             General transfer comment: did not perform after pt tried to pull up on walker     Balance Overall balance assessment: Needs assistance Sitting-balance support: No upper extremity supported;Feet supported Sitting balance-Leahy Scale: Fair         Standing balance comment: able to stand without UE support                           ADL either performed or assessed with clinical judgement   ADL Overall ADL's : Needs assistance/impaired                                       General ADL Comments: Pt did sit EOB with limited participation.  Pts daugther with specific goals in which pt would need to accomplish to come home.  Pt refused to get to chair as she needed to take her nap.   Daughter stated pt has a schedule and change in challenging.       Vision Patient Visual Report: No change from baseline       Perception     Praxis      Pertinent Vitals/Pain Pain Assessment: No/denies pain     Hand Dominance     Extremity/Trunk Assessment Upper Extremity Assessment Upper Extremity Assessment: Generalized weakness           Communication Communication Communication: No difficulties   Cognition Arousal/Alertness: Awake/alert Behavior During Therapy: Agitated;Anxious Overall Cognitive Status: Within Functional Limits for tasks assessed                                                Home Living Family/patient expects to be discharged to:: Private residence Living Arrangements: Spouse/significant other Available Help at Discharge: Family;Available 24 hours/day Type of Home: House Home Access: Ramped  entrance     Home Layout: Two level;Able to live on main level with bedroom/bathroom               Home Equipment: Shower seat;Grab bars - tub/shower;Hand held shower head;Walker - 2 wheels;Cane - single point;Bedside commode          Prior Functioning/Environment Level of Independence: Independent                 OT Problem List: Decreased strength;Decreased activity tolerance;Decreased safety awareness;Decreased knowledge of use of DME or AE      OT Treatment/Interventions: Self-care/ADL training;Patient/family education;DME and/or AE instruction    OT Goals(Current goals can be found in the care plan section)    OT Frequency: Min 2X/week              AM-PAC PT "6 Clicks" Daily Activity     Outcome Measure Help from another person eating meals?: A Little Help  from another person taking care of personal grooming?: A Little Help from another person toileting, which includes using toliet, bedpan, or urinal?: A Lot Help from another person bathing (including washing, rinsing, drying)?: A Lot Help from another person to put on and taking off regular upper body clothing?: A Little Help from another person to put on and taking off regular lower body clothing?: A Lot 6 Click Score: 15   End of Session Nurse Communication: Mobility status  Activity Tolerance: Patient limited by fatigue Patient left: in bed;with call bell/phone within reach;with chair alarm set;with family/visitor present  OT Visit Diagnosis: Unsteadiness on feet (R26.81);Repeated falls (R29.6);Muscle weakness (generalized) (M62.81)                Time: 6629-4765 OT Time Calculation (min): 27 min Charges:  OT Evaluation $OT Eval Moderate Complexity: 1 Mod OT Treatments $Self Care/Home Management : 8-22 mins G-Codes:     Jody Ruiz, [place]  Jody Ruiz 03/09/2017, 2:10 PM

## 2017-03-09 NOTE — Progress Notes (Signed)
Progress Note  Patient Name: Jody Ruiz Date of Encounter: 03/09/2017  Primary Cardiologist: Jody Ruiz  Subjective   No complaints no chest pain or dyspnea   Inpatient Medications    Scheduled Meds: . aspirin EC  81 mg Oral Daily  . cyanocobalamin  1,000 mcg Subcutaneous Daily  . diltiazem  60 mg Oral Q6H  . famotidine  20 mg Oral Daily  . insulin aspart  0-5 Units Subcutaneous QHS  . insulin aspart  0-9 Units Subcutaneous TID WC  . predniSONE  40 mg Oral Q breakfast  . protein supplement  8 oz Oral BID  . saccharomyces boulardii  250 mg Oral BID  . sodium chloride flush  3 mL Intravenous Q12H  . Warfarin - Pharmacist Dosing Inpatient   Does not apply q1800   Continuous Infusions: . sodium chloride Stopped (03/06/17 1900)   PRN Meds: sodium chloride, acetaminophen **OR** acetaminophen, nitroGLYCERIN, [DISCONTINUED] ondansetron **OR** ondansetron (ZOFRAN) IV, ondansetron, polyethylene glycol, polyvinyl alcohol, sodium chloride flush, traMADol   Vital Signs    Vitals:   03/08/17 1300 03/08/17 1400 03/08/17 2019 03/09/17 0447  BP: 137/77 113/66 118/66 131/69  Pulse: 89 86 99 92  Resp: 17 11 12 14   Temp:   98.2 F (36.8 C) 98.2 F (36.8 C)  TempSrc:   Oral Oral  SpO2: 98% 95% 98% 98%  Weight:      Height:        Intake/Output Summary (Last 24 hours) at 03/09/17 0732 Last data filed at 03/08/17 2300  Gross per 24 hour  Intake              126 ml  Output              500 ml  Net             -374 ml   Filed Weights   03/05/17 0842 03/05/17 1423 03/07/17 2015  Weight: 185 lb (83.9 kg) 179 lb 3.7 oz (81.3 kg) 179 lb 10.8 oz (81.5 kg)    Telemetry    afib with RVR, currently heart rates 102 - Personally Reviewed  Physical Exam  Affect appropriate Healthy:  appears stated age HEENT: normal Neck supple with no adenopathy JVP normal no bruits no thyromegaly Lungs clear with no wheezing and good diaphragmatic motion Heart:  S1/S2 no murmur, no rub,  gallop or click PMI normal Abdomen: benighn, BS positve, no tenderness, no AAA no bruit.  No HSM or HJR Distal pulses intact with no bruits No edema Neuro non-focal Skin warm and dry No muscular weakness    Labs    Chemistry Recent Labs Lab 03/04/17 1313  03/07/17 0537 03/07/17 1908 03/08/17 1504 03/09/17 0439  NA 139  < > 143 139 140 142  K 3.9  < > 4.2 5.0 4.7 4.6  CL 107  < > 111 108 108 110  CO2 20*  < > 25 24 22 24   GLUCOSE 133*  < > 123* 269* 251* 146*  BUN 72*  < > 64* 66* 65* 67*  CREATININE 3.50*  < > 3.12* 3.26* 3.35* 3.22*  CALCIUM 8.6*  < > 8.5* 8.3* 8.4* 8.5*  PROT 5.5*  --  5.3*  --   --   --   ALBUMIN 3.0*  --  2.9*  --   --   --   AST 27  --  23  --   --   --   ALT 54  --  53  --   --   --  ALKPHOS 62  --  63  --   --   --   BILITOT 1.3*  --  1.1  --   --   --   GFRNONAA 12*  < > 14* 13* 13* 13*  GFRAA 14*  < > 16* 15* 15* 15*  ANIONGAP 12  < > 7 7 10 8   < > = values in this interval not displayed.   Hematology  Recent Labs Lab 03/07/17 1908 03/08/17 1504 03/09/17 0439  WBC 8.3 9.8 8.9  RBC 2.67* 2.80* 2.79*  HGB 8.3* 8.7* 8.5*  HCT 25.2* 26.6* 25.7*  MCV 94.4 95.0 92.1  MCH 31.1 31.1 30.5  MCHC 32.9 32.7 33.1  RDW 17.1* 16.9* 16.3*  PLT 220 219 194    Cardiac Enzymes  Recent Labs Lab 03/06/17 0951  TROPONINI 0.05*     Recent Labs Lab 03/04/17 1323  TROPIPOC 0.06       Patient Profile     76 y.o. female with rapidly progressive glomerulonephritis ANA vasculitis, pulmonary fibrosis, and marked recently clinical decline.  Cardiology is consulted for Afib with RVR.  Assessment & Plan    1. Persistent afib Rate control ok this am  Metoprolol has been switched to cardizem  60 q 6  She does not have any AAD options and is also not an ablation candidate.  Continue rate control with oral diltiazem at this time.   2. Tachy/brady syndrome She has previously had sinus bradycardia.  Continue oral rate control for now.  If she  converts to sinus, we will reassess rhythm at that time.  Given her poor clinical condition, I would like to avoid pacing if possible.  She is known to Jody Ruiz and I will let him know that she is here.  3. HTN Stable No change required today  4. Code status Appreciate psychiatric input Primary team to discuss code status with her when awake (she told me yesterday that she would like to be DNI/DNR).  Palliative care consult would also seem very reasonable given her preferences.  5. CRF Cr 3.2 limits AAT drugs does not want dialysis On rituxan for ANA vasculitis   Jody Ruiz

## 2017-03-10 LAB — CBC
HEMATOCRIT: 25.5 % — AB (ref 36.0–46.0)
Hemoglobin: 8.6 g/dL — ABNORMAL LOW (ref 12.0–15.0)
MCH: 31 pg (ref 26.0–34.0)
MCHC: 33.7 g/dL (ref 30.0–36.0)
MCV: 92.1 fL (ref 78.0–100.0)
Platelets: 208 10*3/uL (ref 150–400)
RBC: 2.77 MIL/uL — ABNORMAL LOW (ref 3.87–5.11)
RDW: 16.3 % — AB (ref 11.5–15.5)
WBC: 10.6 10*3/uL — ABNORMAL HIGH (ref 4.0–10.5)

## 2017-03-10 LAB — BASIC METABOLIC PANEL
Anion gap: 8 (ref 5–15)
BUN: 64 mg/dL — AB (ref 6–20)
CO2: 23 mmol/L (ref 22–32)
Calcium: 8.6 mg/dL — ABNORMAL LOW (ref 8.9–10.3)
Chloride: 110 mmol/L (ref 101–111)
Creatinine, Ser: 2.95 mg/dL — ABNORMAL HIGH (ref 0.44–1.00)
GFR calc Af Amer: 17 mL/min — ABNORMAL LOW (ref >60)
GFR, EST NON AFRICAN AMERICAN: 15 mL/min — AB (ref >60)
GLUCOSE: 151 mg/dL — AB (ref 65–99)
Potassium: 4.6 mmol/L (ref 3.5–5.1)
Sodium: 141 mmol/L (ref 135–145)

## 2017-03-10 LAB — HEPATITIS B CORE ANTIBODY, TOTAL: HEP B C TOTAL AB: NEGATIVE

## 2017-03-10 LAB — PROTIME-INR
INR: 2.47
Prothrombin Time: 26.5 seconds — ABNORMAL HIGH (ref 11.4–15.2)

## 2017-03-10 LAB — GLUCOSE, CAPILLARY
Glucose-Capillary: 115 mg/dL — ABNORMAL HIGH (ref 65–99)
Glucose-Capillary: 174 mg/dL — ABNORMAL HIGH (ref 65–99)
Glucose-Capillary: 290 mg/dL — ABNORMAL HIGH (ref 65–99)
Glucose-Capillary: 252 mg/dL — ABNORMAL HIGH (ref 65–99)

## 2017-03-10 MED ORDER — PREDNISONE 5 MG PO TABS: 5.0000 mg | ORAL_TABLET | ORAL | Status: DC

## 2017-03-10 MED ORDER — DIGOXIN 125 MCG PO TABS
0.0625 mg | ORAL_TABLET | Freq: Every day | ORAL | Status: DC
  Filled 2017-03-10: qty 0.5

## 2017-03-10 MED ORDER — DILTIAZEM HCL ER 60 MG PO CP12
60.0000 mg | ORAL_CAPSULE | Freq: Two times a day (BID) | ORAL | Status: AC
  Administered 2017-03-10: 60 mg via ORAL
  Filled 2017-03-10: qty 1

## 2017-03-10 MED ORDER — DILTIAZEM HCL 25 MG/5ML IV SOLN
10.0000 mg | Freq: Once | INTRAVENOUS | Status: DC
  Filled 2017-03-10: qty 5

## 2017-03-10 MED ORDER — WARFARIN SODIUM 2.5 MG PO TABS
1.2500 mg | ORAL_TABLET | Freq: Once | ORAL | Status: AC
  Administered 2017-03-10: 1.25 mg via ORAL
  Filled 2017-03-10: qty 0.5

## 2017-03-10 MED ORDER — PREDNISONE 5 MG PO TABS: 5.0000 mg | ORAL_TABLET | Freq: Every day | ORAL | Status: DC

## 2017-03-10 MED ORDER — DILTIAZEM HCL ER COATED BEADS 180 MG PO CP24: 300.0000 mg | ORAL_CAPSULE | Freq: Every day | ORAL | Status: DC

## 2017-03-10 MED ORDER — PREDNISONE 20 MG PO TABS: 20.0000 mg | ORAL_TABLET | Freq: Every day | ORAL | Status: DC

## 2017-03-10 MED ORDER — CYANOCOBALAMIN 1000 MCG/ML IJ SOLN: 1000.0000 ug | INTRAMUSCULAR | Status: DC

## 2017-03-10 MED ORDER — DILTIAZEM HCL ER COATED BEADS 180 MG PO CP24
300.0000 mg | ORAL_CAPSULE | Freq: Every day | ORAL | Status: DC
  Filled 2017-03-10: qty 1

## 2017-03-10 MED ORDER — PREDNISONE 5 MG PO TABS
30.0000 mg | ORAL_TABLET | Freq: Every day | ORAL | Status: DC
  Administered 2017-03-13: 08:00:00 30 mg via ORAL
  Filled 2017-03-10: qty 1

## 2017-03-10 MED ORDER — SODIUM CHLORIDE 0.9 % IV BOLUS (SEPSIS): 500.0000 mL | Freq: Once | INTRAVENOUS | Status: DC

## 2017-03-10 MED ORDER — POLYETHYLENE GLYCOL 3350 17 G PO PACK
17.0000 g | PACK | Freq: Every day | ORAL | Status: DC
  Administered 2017-03-10 – 2017-03-11 (×2): 17 g via ORAL
  Filled 2017-03-10 (×2): qty 1

## 2017-03-10 MED ORDER — PREDNISONE 5 MG PO TABS: 10.0000 mg | ORAL_TABLET | Freq: Every day | ORAL | Status: DC

## 2017-03-10 NOTE — Progress Notes (Signed)
[facility] Progress Note  [name] LFY:[id number]   PCP: Burnard Bunting, MD DOB: 04-07-1941   DOA: 03/04/2017   DOS: 03/10/2017   Date of Service: the patient was seen and examined on 03/10/2017  Subjective: This morning went into RVR at 6:24 AM. While working with physical therapy had a syncopal event again. Denies any acute complaint at the time of my evaluation. Constipated.  Brief hospital course: Pt. with PMH of chronic A. 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 fib with RVR. Currently further plan is continue rate control.  Assessment and Plan: 1. A. fib with RVR. Heart rate still elevated. Received IV Cardizem drip and heart rate remained under control. Patient has reoccurrence of RVR multiple times, cardiology consulted as well as EP consulted. Not a candidate for atrial fibrillation ablation, or antiarrhythmic therapy due to her comorbidities recommend to continue rate control medications for now. Maintain K more than 4 med more than 2. TSH within normal limits. Mild elevation of troponin likely due to demand ischemia. Patient required to be transferred to step down on 03/07/2017 due to persistent tachycardia and hypotension. Now back in controlled rate. Would prefer the patient to remain on Cardizem instead of the Lopressor. Patient wasn't to 40 mg Cardizem daily dose. We will give her 60 mg extra dose and transition her to 300 mg tomorrow. Discussed with nephrology, recommend to use digoxin, discussed with pharmacy, recommend use 0.0625 mg dose. Ideally would recommend a digoxin level in 4 days to one week.  2. Depression. Appreciate psychiatry input. Patient and family requested psychiatric consultation for capacity evaluation. Per psychiatry evaluation patient does have capacity to make her own medical decision. Psychiatry recommended Remeron although  the patient has refused it for now.  3. Chronic kidney disease. ANA vasculitis. Concern for UTI. Patient presented with a fall. UA was showing increased WBC but it also has increased, 7 epithelial cell therefore do not think that this is a clean sample. Patient does not have any fever or chills or leukocytosis to have a concern for UTI. Culture are growing 20000 colonies, per nephrology request will treat with cipro since the pt will be on immunosuppressive meds.  Patient is on Rituxan for ANA vasculitis and a patient can continue with her regular dosing. Appreciate nephrology input. Tolerated dose very well without any complication.  4. Fall. No focal deficit, likely from generalized deconditioning from recurrent hospitalization. PT consult recommends no PT follow-up although patient required significant assistance, rolling walker . PT recommends home health, patient agreeable to it now.  5. idiopathic pulmonary fibrosis. Continue steroids, patient is currently taking 40 mg daily.  6. Essential hypertension. Continue current regimen.  7. Severe B-12 deficiency. Recommend patient to continue B-12 injections weekly as an outpatient on a weekly basis.  8. Nausea and vomiting. medication induced. Monitor now. Minimize unnecessary medication.  9. Chronic diastolic CHF. Euvolemic for now.  patient was receiving IV fluids, will change it.  10 goals of care discussion. I had extensive discussion with patient yesterday before psychiatry evaluation as well as after psychiatry evaluation and this morning. Patient would like to continue full code for now. Patient wants to go home as soon as possible although when mention about barrier to go home, with her weakness requiring physical therapy; patient does not want home health and also does not want SNF. Patient was informed that without the use there  is a high chance of her coming back to the hospital with deconditioning that she has. Would  request proper recommendation from PT and OT regarding outpatient care. Does not want palliative care discussion right now. not appearing to be a candidate for hospice right now as well.  Diet: Cardiac diet DVT Prophylaxis: on therapeutic anticoagulation.  Advance goals of care discussion: full code  Family Communication: family was present at bedside, at the time of interview. The pt provided permission to discuss medical plan with the family. Opportunity was given to ask question and all questions were answered satisfactorily.   Disposition:  Discharge home tomorrow depending on rate control.  Consultants: EP, cardiology, psychiatry, nephrology  Procedures: none  Antibiotics: Anti-infectives    Start     Dose/Rate Route Frequency Ordered Stop   03/10/17 0800  ciprofloxacin (CIPRO) tablet 500 mg  Status:  Discontinued     500 mg Oral Daily with breakfast 03/09/17 1108 03/09/17 1227   03/09/17 2000  ciprofloxacin (CIPRO) tablet 500 mg     500 mg Oral Every 24 hours 03/09/17 1227 03/12/17 1959   03/06/17 1300  cefTRIAXone (ROCEPHIN) 1 g in dextrose 5 % 50 mL IVPB  Status:  Discontinued     1 g 100 mL/hr over 30 Minutes Intravenous Every 24 hours 03/06/17 1212 03/06/17 1311       Objective: Physical Exam: Vitals:   03/10/17 1453 03/10/17 1533 03/10/17 1538 03/10/17 1546  BP: (!) 109/52 112/60 (!) 101/55 108/90  Pulse: 88 82  87  Resp: 18 18    Temp: 97.7 F (36.5 C) 98.3 F (36.8 C)    TempSrc: Oral Oral    SpO2: 98% 96%    Weight:      Height:        Intake/Output Summary (Last 24 hours) at 03/10/17 1816 Last data filed at 03/10/17 1000  Gross per 24 hour  Intake                3 ml  Output             1600 ml  Net            -1597 ml   Filed Weights   03/05/17 0842 03/05/17 1423 03/07/17 2015  Weight: 83.9 kg (185 lb) 81.3 kg (179 lb 3.7 oz) 81.5 kg (179 lb 10.8 oz)   General: Alert, Awake and Oriented to Time, Place and Person. Appear in mild distress, affect  flat Eyes: PERRL, Conjunctiva normal ENT: Oral Mucosa clear moist. Neck: no JVD, no Abnormal Mass Or lumps Cardiovascular: S1 and S2 Present, no Murmur, Peripheral Pulses Present Respiratory: normal respiratory effort, Bilateral Air entry equal and Decreased, no use of accessory muscle, Clear to Auscultation, no Crackles, no wheezes Abdomen: Bowel Sound present, Soft and no tenderness, no hernia Skin: no redness, no Rash, no induration Extremities: trace Pedal edema, no calf tenderness Neurologic: Grossly no focal neuro deficit. Bilaterally Equal motor strength  Data Reviewed: CBC:  Recent Labs Lab 03/04/17 1313  03/05/17 1049 03/07/17 1908 03/08/17 1504 03/09/17 0439 03/10/17 0420  WBC 10.2  --  9.9 8.3 9.8 8.9 10.6*  NEUTROABS 9.4*  --   --  7.6  --   --   --   HGB 8.9*  < > 8.1* 8.3* 8.7* 8.5* 8.6*  HCT 25.0*  < > 23.6* 25.2* 26.6* 25.7* 25.5*  MCV 93.3  --  92.2 94.4 95.0 92.1 92.1  PLT 250  --  209 220  219 194 208  < > = values in this interval not displayed. Basic Metabolic Panel:  Recent Labs Lab 03/06/17 0951 03/07/17 0537 03/07/17 1908 03/08/17 1504 03/09/17 0439 03/10/17 0420  NA  --  143 139 140 142 141  K  --  4.2 5.0 4.7 4.6 4.6  CL  --  111 108 108 110 110  CO2  --  25 24 22 24 23   GLUCOSE  --  123* 269* 251* 146* 151*  BUN  --  64* 66* 65* 67* 64*  CREATININE  --  3.12* 3.26* 3.35* 3.22* 2.95*  CALCIUM  --  8.5* 8.3* 8.4* 8.5* 8.6*  MG 1.6*  --  2.0 2.0  --   --     Liver Function Tests:  Recent Labs Lab 03/04/17 1313 03/07/17 0537  AST 27 23  ALT 54 53  ALKPHOS 62 63  BILITOT 1.3* 1.1  PROT 5.5* 5.3*  ALBUMIN 3.0* 2.9*   No results for input(s): LIPASE, AMYLASE in the last 168 hours. No results for input(s): AMMONIA in the last 168 hours. Coagulation Profile:  Recent Labs Lab 03/06/17 0441 03/07/17 0537 03/08/17 0340 03/09/17 0439 03/10/17 0420  INR 2.53 2.09 1.82 2.14 2.47   Cardiac Enzymes:  Recent Labs Lab  03/05/17 1427 03/06/17 0951  CKTOTAL 49  --   TROPONINI  --  0.05*   BNP (last 3 results) No results for input(s): PROBNP in the last 8760 hours. CBG:  Recent Labs Lab 03/09/17 1151 03/09/17 1721 03/10/17 0753 03/10/17 1135 03/10/17 1703  GLUCAP 183* 224* 115* 174* 290*   Studies: No results found.  Scheduled Meds: . aspirin EC  81 mg Oral Daily  . ciprofloxacin  500 mg Oral Q24H  . [START ON 03/17/2017] cyanocobalamin  1,000 mcg Subcutaneous Weekly  . digoxin  0.0625 mg Oral Daily  . [START ON 03/11/2017] diltiazem  300 mg Oral Daily  . famotidine  20 mg Oral Daily  . insulin aspart  0-5 Units Subcutaneous QHS  . insulin aspart  0-9 Units Subcutaneous TID WC  . polyethylene glycol  17 g Oral Daily  . [START ON 04/10/2017] predniSONE  10 mg Oral Q breakfast  . [START ON 03/27/2017] predniSONE  20 mg Oral Q breakfast  . [START ON 03/13/2017] predniSONE  30 mg Oral Q breakfast  . predniSONE  40 mg Oral Q breakfast  . [START ON 04/24/2017] predniSONE  5 mg Oral Q breakfast  . [START ON 05/08/2017] predniSONE  5 mg Oral Q48H  . protein supplement  8 oz Oral BID  . saccharomyces boulardii  250 mg Oral BID  . sodium chloride flush  3 mL Intravenous Q12H  . warfarin  1.25 mg Oral ONCE-1800  . Warfarin - Pharmacist Dosing Inpatient   Does not apply q1800   Continuous Infusions: . sodium chloride Stopped (03/06/17 1900)   PRN Meds: sodium chloride, acetaminophen **OR** acetaminophen, nitroGLYCERIN, [DISCONTINUED] ondansetron **OR** ondansetron (ZOFRAN) IV, ondansetron, polyvinyl alcohol, sodium chloride flush, traMADol  Time spent: 35 minutes  Author: Berle Mull, MD [facility] Pager: [phone] 03/10/2017 6:16 PM  If 7PM-7AM, please contact night-coverage at [email or web address], password [email or web address]

## 2017-03-10 NOTE — Progress Notes (Signed)
Manual calculation of BSA and dosing for Rituximab completed.  Verification performed by Aldean Baker RN and Jena Gauss RN

## 2017-03-10 NOTE — Progress Notes (Signed)
OT Cancellation Note  Patient Details Name: Jody Ruiz MRN: [id number] DOB: [birth year]   Cancelled Treatment:    Reason Eval/Treat Not Completed: Medical issues which prohibited therapy  Noted Rapid Response with pt.   Will check on pt next day or later in day as schedule allows.  Kari Baars, [place]  Payton Mccallum D 03/10/2017, 10:03 AM

## 2017-03-10 NOTE — Progress Notes (Signed)
Pt refusing to take digoxin at this time. Both patient and daughter would like to speak with MD tomorrow about medication. Patient daughter also requesting to have cardiology and nephrology MD to see them tomorrow as well. Gailya Tauer A

## 2017-03-10 NOTE — Significant Event (Signed)
Rapid Response Event Note  Overview: Time Called: 0930 Event Type: Neurologic  Initial Focused Assessment: Neuro  Interventions: Assisted back to bed  Plan of Care (if not transferred): Continue to monitor  Event Summary:  RRT RN called by CN on 4E for assistance with unresponsive patient up on bedside commode. Upon arrival patient is now responsive but lethargic. Able to answer questions appropriately. RN and PT in room states she hasn't been out of bed since admission. Assisted back to bed without further problems. PT stated patient's HR rose to 180's upon getting up to [facility]. Currently she is BP102/83 MAP 90, HR 113 AFIB, RR16. Stable, A/O. Bedside RN to call MD. Bedside RN made aware to call RRT RN if further needed.     at          [place], [place]

## 2017-03-10 NOTE — Progress Notes (Signed)
Infusion of Rituxin completed.  Patient tolerated well without complaints.  VSS throughout infusion.

## 2017-03-10 NOTE — Progress Notes (Signed)
[facility] Progress Note  Subjective: no c/o  Vitals:   03/10/17 1330 03/10/17 1400 03/10/17 1453 03/10/17 1533  BP: (!) 109/55 (!) 102/58 (!) 109/52 112/60  Pulse: 82 80 88 82  Resp: 18 16 18 18   Temp: (!) 97.4 F (36.3 C) 97.6 F (36.4 C) 97.7 F (36.5 C) 98.3 F (36.8 C)  TempSrc: Oral Oral Oral Oral  SpO2: 97% 98% 98% 96%  Weight:      Height:        Inpatient medications: . aspirin EC  81 mg Oral Daily  . ciprofloxacin  500 mg Oral Q24H  . [START ON 03/17/2017] cyanocobalamin  1,000 mcg Subcutaneous Weekly  . [START ON 03/11/2017] diltiazem  300 mg Oral Daily  . famotidine  20 mg Oral Daily  . insulin aspart  0-5 Units Subcutaneous QHS  . insulin aspart  0-9 Units Subcutaneous TID WC  . polyethylene glycol  17 g Oral Daily  . [START ON 04/10/2017] predniSONE  10 mg Oral Q breakfast  . [START ON 03/27/2017] predniSONE  20 mg Oral Q breakfast  . [START ON 03/13/2017] predniSONE  30 mg Oral Q breakfast  . predniSONE  40 mg Oral Q breakfast  . [START ON 04/24/2017] predniSONE  5 mg Oral Q breakfast  . [START ON 05/08/2017] predniSONE  5 mg Oral Q48H  . protein supplement  8 oz Oral BID  . saccharomyces boulardii  250 mg Oral BID  . sodium chloride flush  3 mL Intravenous Q12H  . warfarin  1.25 mg Oral ONCE-1800  . Warfarin - Pharmacist Dosing Inpatient   Does not apply q1800   . sodium chloride Stopped (03/06/17 1900)   sodium chloride, acetaminophen **OR** acetaminophen, nitroGLYCERIN, [DISCONTINUED] ondansetron **OR** ondansetron (ZOFRAN) IV, ondansetron, polyvinyl alcohol, sodium chloride flush, traMADol  Exam: Gen alert, no distress No rash, cyanosis or gangrene Sclera anicteric, throat clear  No jvd or bruits Chest clear bilat RRR no MRG Abd soft ntnd no mass or ascites +bs GU defer MS no joint effusions or deformity Ext no LE edema / no wounds or ulcers Neuro is alert, Ox 3 , nf      Impression: 1.  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 Has rec'd her 2nd (of 4) Rituximab infusion today w/o any side effects.  Creat is down slightly to 2.9.   2.  UTI - possible UTI , low counts , we recommended abx but pt is refusing.   3.  HTN 4.  Vol is euvolemic 5.  Afib with RVR - had another syncopal episode associated with uncont afib w HR in 180's, occurred prior to the infusion this am while working w PT.  Consider digoxin?    6.  IDDM  Rec - no other suggestions.  Will sign off.    Kelly Splinter MD [facility] pager [phone]   03/10/2017, 3:34 PM    Recent Labs Lab 03/08/17 1504 03/09/17 0439 03/10/17 0420  NA 140 142 141  K 4.7 4.6 4.6  CL 108 110 110  CO2 22 24 23   GLUCOSE 251* 146* 151*  BUN 65* 67* 64*  CREATININE 3.35* 3.22* 2.95*  CALCIUM 8.4* 8.5* 8.6*    Recent Labs Lab 03/04/17 1313 03/07/17 0537  AST 27 23  ALT 54 53  ALKPHOS 62 63  BILITOT 1.3* 1.1  PROT 5.5* 5.3*  ALBUMIN 3.0* 2.9*    Recent Labs Lab  03/04/17 1313  03/07/17 1908 03/08/17 1504 03/09/17 0439 03/10/17 0420  WBC 10.2  < > 8.3 9.8 8.9 10.6*  NEUTROABS 9.4*  --  7.6  --   --   --   HGB 8.9*  < > 8.3* 8.7* 8.5* 8.6*  HCT 25.0*  < > 25.2* 26.6* 25.7* 25.5*  MCV 93.3  < > 94.4 95.0 92.1 92.1  PLT 250  < > 220 219 194 208  < > = values in this interval not displayed. Iron/TIBC/Ferritin/ %Sat    Component Value Date/Time   IRON 109 02/27/2017 1230   TIBC 255 02/27/2017 1230   FERRITIN 877 (H) 02/27/2017 1230   IRONPCTSAT 43 (H) 02/27/2017 1230

## 2017-03-10 NOTE — Progress Notes (Signed)
Physical Therapy Treatment Patient Details Name: Jody Ruiz MRN: [id number] DOB: 1940-08-30 Today's Date: 03/10/2017    History of Present Illness Jody Ruiz is a 76 y.o. female with a h/o recent clinical decline.  She has been diagnosed with rapidly progressive glomerulonephritis ANA vasculitis and has required steroids and rituximab which she has not well tolerated.  She also CAD s/p prior PCI and interstitial lung disease.  She is admitted with afib with RVR.  Of note, she has recently also had sinus bradycardia and pauses of 3.3 seconds with afib termination.    PT Comments    Pt limited today by decr BP, incr HR/tachy; returned to bed with rapid response RN and bedside RN present; pt alert and oriented  Follow Up Recommendations  Home health PT (agreeable today)     Equipment Recommendations  Wheelchair (measurements PT)    Recommendations for Other Services       Precautions / Restrictions Precautions Precautions: Fall Restrictions Weight Bearing Restrictions: No    Mobility  Bed Mobility Overal bed mobility: Needs Assistance Bed Mobility: Supine to Sit;Sit to Supine     Supine to sit: Min guard Sit to supine: Min assist;Mod assist   General bed mobility comments: pt with syncopal episode, requiring more assist to get back to bed  Transfers Overall transfer level: Needs assistance Equipment used: Rolling walker (2 wheeled) Transfers: Sit to/from [facility] Sit to Stand: Min assist Stand pivot transfers: Min assist       General transfer comment: multi-modal cues for hand placement and safety; incr time needed; pt HR up to 180, tachy; pt c/o dizziness while trying to have BM  on BSC;  pt then had brief LOC, HR 120s, tachy, pupils dilated, unreponsive to voice, sternal rub; NT present RN called to room; BP 91/51 (see flowsheets for RNs doc/VS)  Ambulation/Gait         Gait velocity: deferred       Stairs             Wheelchair Mobility    Modified Rankin (Stroke Patients Only)       Balance                                            Cognition Arousal/Alertness: Awake/alert Behavior During Therapy: Flat affect Overall Cognitive Status: Within Functional Limits for tasks assessed                                        Exercises      General Comments        Pertinent Vitals/Pain Pain Assessment: No/denies pain    Home Living                      Prior Function            PT Goals (current goals can now be found in the care plan section) Acute Rehab PT Goals Patient Stated Goal: none stated PT Goal Formulation: With patient Time For Goal Achievement: 03/14/17 Potential to Achieve Goals: Fair Progress towards PT goals: Not progressing toward goals - comment (medical issues)    Frequency    Min 2X/week      PT Plan Current plan remains appropriate    Co-evaluation  AM-PAC PT "6 Clicks" Daily Activity  Outcome Measure  Difficulty turning over in bed (including adjusting bedclothes, sheets and blankets)?: Unable Difficulty moving from lying on back to sitting on the side of the bed? : Unable Difficulty sitting down on and standing up from a chair with arms (e.g., wheelchair, bedside commode, etc,.)?: Unable Help needed moving to and from a bed to chair (including a wheelchair)?: A Lot Help needed walking in hospital room?: A Lot Help needed climbing 3-5 steps with a railing? : A Lot 6 Click Score: 9    End of Session Equipment Utilized During Treatment: Gait belt Activity Tolerance: Treatment limited secondary to medical complications (Comment) Patient left: in bed;with call bell/phone within reach;with family/visitor present;with nursing/sitter in room   PT Visit Diagnosis: Adult, failure to thrive (R62.7);Muscle weakness (generalized) (M62.81)     Time: 5462-7035 PT Time Calculation (min) (ACUTE  ONLY): 44 min  Charges:  $Therapeutic Activity: 38-52 mins                    G Codes:          Jody Ruiz 03-16-2017, 12:17 PM

## 2017-03-10 NOTE — Progress Notes (Signed)
[place] for warfarin Indication: atrial fibrillation  Allergies  Allergen Reactions  . Brilinta [Ticagrelor] Shortness Of Breath, Other (See Comments) and Cough    Reaction:  Fatigue  . Lisinopril Shortness Of Breath and Cough  . Statins Other (See Comments)    Reaction:  Muscle pain/weakness  . Zetia [Ezetimibe] Other (See Comments)    Reaction:  Muscle pain/weakness   . Macrodantin [Nitrofurantoin Macrocrystal] Rash  . Sulfa Antibiotics Rash    Patient Measurements: Height: 5' 7.01" (170.2 cm) Weight: 179 lb 10.8 oz (81.5 kg) IBW/kg (Calculated) : 61.62 Heparin Dosing Weight:   Vital Signs: Temp: 98.3 F (36.8 C) (09/04 0428) Temp Source: Oral (09/04 0428) BP: 133/87 (09/04 0428) Pulse Rate: 96 (09/04 0428)  Labs:  Recent Labs  03/08/17 0340 03/08/17 1504 03/09/17 0439 03/10/17 0420  HGB  --  8.7* 8.5* 8.6*  HCT  --  26.6* 25.7* 25.5*  PLT  --  219 194 208  LABPROT 20.9*  --  23.7* 26.5*  INR 1.82  --  2.14 2.47  CREATININE  --  3.35* 3.22* 2.95*    Estimated Creatinine Clearance: 18.4 mL/min (A) (by C-G formula based on SCr of 2.95 mg/dL (H)).   Medications:  - Home warfarin: 1.25mg  MWF and 2.5mg  TTSS (last dose 8/28)  Assessment: Patient's a 76 y.o F with hx afib on warfarin PTA presented the ED on 03/04/17 with AMS.  Warfarin resumed on admission. Note PMH CAD s/p recent stents, on ASA.  Today, 03/10/2017: - INR now therapeutic at 2.47 (trending up) - Hgb low but stable, Plts wnl - no bleeding documented - Concurrent aspirin 81mg  noted - Cipro ordered which would be a significant drug interaction, however, patient currently refusing - Renal diet ordered, moderate intake   Goal of Therapy:  INR 2-3 Monitor platelets by anticoagulation protocol: Yes   Plan:  - warfarin 1.25 mg PO x1 today (lower than usual dose due to upward INR trend) - monitor for s/s bleeding - daily INR  Peggyann Juba, PharmD,  BCPS Pager: [phone] 03/10/2017, 7:33 AM

## 2017-03-10 NOTE — Consult Note (Signed)
   [facility] CM Inpatient Consult   03/10/2017  Jody Ruiz 24-May-1941 [id number]   Screened for [facility] Care Management program due to multiple hospitalizations.   Went to bedside to speak with Jody Ruiz and daughter, Jody Ruiz. However, [name] was receiving bedside infusion.   Spoke with Jody Ruiz, daughter about potential [facility] Care Management services. Jody Ruiz collected information and states she will speak with her mother about [facility] Management services further. Jody Ruiz states that her mother gets overwhelmed easily and follow up phone calls may be too much for her. However, Jody Ruiz states she will still discuss with patient.   Discussed that writer will follow up at later time.   Provided contact information, 24-hr nurse line magnet, [facility] Care Management brochure.   Will make inpatient RNCM aware.    Marthenia Rolling, MSN-Ed, RN,BSN [facility] Liaison [phone]

## 2017-03-11 DIAGNOSIS — E43 Unspecified severe protein-calorie malnutrition: Secondary | ICD-10-CM

## 2017-03-11 DIAGNOSIS — N170 Acute kidney failure with tubular necrosis: Secondary | ICD-10-CM

## 2017-03-11 DIAGNOSIS — N183 Chronic kidney disease, stage 3 (moderate): Secondary | ICD-10-CM

## 2017-03-11 LAB — CBC
HCT: 26.2 % — ABNORMAL LOW (ref 36.0–46.0)
Hemoglobin: 8.6 g/dL — ABNORMAL LOW (ref 12.0–15.0)
MCH: 31 pg (ref 26.0–34.0)
MCHC: 32.8 g/dL (ref 30.0–36.0)
MCV: 94.6 fL (ref 78.0–100.0)
Platelets: 223 10*3/uL (ref 150–400)
RBC: 2.77 MIL/uL — ABNORMAL LOW (ref 3.87–5.11)
RDW: 16.7 % — ABNORMAL HIGH (ref 11.5–15.5)
WBC: 12.8 10*3/uL — ABNORMAL HIGH (ref 4.0–10.5)

## 2017-03-11 LAB — BASIC METABOLIC PANEL
ANION GAP: 7 (ref 5–15)
BUN: 69 mg/dL — ABNORMAL HIGH (ref 6–20)
CALCIUM: 8.6 mg/dL — AB (ref 8.9–10.3)
CO2: 25 mmol/L (ref 22–32)
Chloride: 106 mmol/L (ref 101–111)
Creatinine, Ser: 3.25 mg/dL — ABNORMAL HIGH (ref 0.44–1.00)
GFR, EST AFRICAN AMERICAN: 15 mL/min — AB (ref >60)
GFR, EST NON AFRICAN AMERICAN: 13 mL/min — AB (ref >60)
GLUCOSE: 152 mg/dL — AB (ref 65–99)
Potassium: 4.8 mmol/L (ref 3.5–5.1)
Sodium: 138 mmol/L (ref 135–145)

## 2017-03-11 LAB — GLUCOSE, CAPILLARY
Glucose-Capillary: 134 mg/dL — ABNORMAL HIGH (ref 65–99)
Glucose-Capillary: 190 mg/dL — ABNORMAL HIGH (ref 65–99)
Glucose-Capillary: 240 mg/dL — ABNORMAL HIGH (ref 65–99)
Glucose-Capillary: 301 mg/dL — ABNORMAL HIGH (ref 65–99)

## 2017-03-11 LAB — PROTIME-INR
INR: 2.75
PROTHROMBIN TIME: 28.9 s — AB (ref 11.4–15.2)

## 2017-03-11 MED ORDER — DILTIAZEM HCL ER COATED BEADS 180 MG PO CP24
360.0000 mg | ORAL_CAPSULE | Freq: Every day | ORAL | Status: DC
  Administered 2017-03-11 – 2017-03-13 (×3): 360 mg via ORAL
  Filled 2017-03-11 (×3): qty 2

## 2017-03-11 MED ORDER — WARFARIN 0.5 MG HALF TABLET
0.5000 mg | ORAL_TABLET | Freq: Once | ORAL | Status: AC
Start: 2017-03-11 — End: 2017-03-11
  Administered 2017-03-11: 0.5 mg via ORAL
  Filled 2017-03-11: qty 1

## 2017-03-11 NOTE — Progress Notes (Signed)
Nutrition Follow-up  DOCUMENTATION CODES:   Severe malnutrition in context of acute illness/injury  INTERVENTION:  - Will d/c Unjury. - Will order 10AM and 2PM snacks.  - Continue to encourage PO intakes throughout the day.  NUTRITION DIAGNOSIS:   Malnutrition (severe) related to acute illness (weakness, recent admission with N/V and associated decreased intakes) as evidenced by percent weight loss, moderate depletions of muscle mass. -ongoing  GOAL:   Patient will meet greater than or equal to 90% of their needs -beginning to meet  MONITOR:   PO intake, Supplement acceptance, Weight trends, Labs, I & O's  ASSESSMENT:   76 y.o. 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 Patient was sitting in the commode and fell to the floor due to weakness in her legs. Patient was just discharged 8/27 after workup for near syncope, thought to be secondary to dehydration.   9/5 Per chart review, pt consumed 100% of breakfast and lunch and 75% of dinner on 9/1; 90% of breakfast 9/2; 70% of breakfast and 60% of lunch on 9/3; 80% of breakfast this AM. Pt has been refusing all doses of Unjury Chicken Soup and does not like any oral nutrition supplements. Will order snacks per pt preference BID. Weight on 9/1 was consistent with weight on 8/30 and no new weight since 9/1.   Per Dr. Serita Grit note yesterday, Afib with RVR and not a candidate for Afib ablation or antiarrhythmic therapy d/t comorbidities. Concern for UTI. Idiopathic pulmonary fibrosis, severe vitamin B12 deficiency, and improved N/V. Dr. Posey Pronto had [place] discussion with the patient on 9/3 and 9/4 and pt  would like to remain full code and does not desire Palliative Care involvement. His note states "not appearing to be a candidate for hospice right now."  Medications reviewed; 1000 mcg subcutaneous vitamin B12 x1/week, 20 mg oral Pepcid/day, sliding scale Novolog, 1 packet Miralax/day, steroid taper, 250 mg Florastor BID. Labs reviewed; CBG: 134 mg/dL this AM, BUN: 69 mg/dL, creatinine: 3.25 mg/dL, Ca: 8.6 mg/dL, GFR: 13 mL/min.  IVF: NS @ 50 mL/hr.    8/31 - No intakes documented since admission. U - Unable to talk with pt x2 attempts.  - Per chart review, pt has lost 125 lbs (6% body weight) in the past 1 month; this is significant for time frame.   Pt was seen by another RD during last admission (seen 8/24 by RD). Information from that visit outlined below:  Familyreportstracking pt's meals over the past weeks.   Per report, pt has only been consuming 500-600 kcal per day.   Per chart review, pt meal completion is 25% during current admission.  Family report pt's UBW is 220-230 lb, stating she weighed this in 07/2016.   Pt has experienced a downward trend in weight over the past couple months.   Per weight history, pt has 9% wt loss in 1 month, significant for time frame.   Intermittent nausea since July and overall weakness.   The nausea has steadlyincreased over the past 2 weeks and has impacted PO intake.  Pt reports disliking all Boost and other nutritional supplement products. However, ptamenable to Unjury chicken soup flavor as pt states she enjoys soup.   Nutrition focused physical exam  completed. Findings include no fat depletion, mild muscle depletion and no edema.   Mg: 1.6 mg/dL IVF: NS @ 50 mL/hr.    Diet Order:  Diet renal with fluid restriction Fluid restriction: 1200 mL Fluid; Room service appropriate? Yes; Fluid consistency: Thin  Skin:  Reviewed, no issues  Last BM:  9/1  Height:   Ht Readings from Last 1 Encounters:  03/07/17 5' 7.01" (1.702  m)    Weight:   Wt Readings from Last 1 Encounters:  03/07/17 179 lb 10.8 oz (81.5 kg)    Ideal Body Weight:  61.36 kg  BMI:  Body mass index is 28.13 kg/m.  Estimated Nutritional Needs:   Kcal:  1630-1870 (20-23 kcal/kg)  Protein:  65-80 grams (0.8-1 grams/kg)  Fluid:  1.2-1.5 L/day  EDUCATION NEEDS:   No education needs identified at this time    Jarome Matin, MS, RD, LDN, CNSC Inpatient Clinical Dietitian Pager # [phone] After hours/weekend pager # [phone]

## 2017-03-11 NOTE — Plan of Care (Signed)
Problem: Education: Goal: Knowledge of [place] [facility] Education information/materials will improve Outcome: Completed/Met Date Met: 03/11/17 discussed continued plan of care in detail with pt/granddaughter. Pt is not agreeable to some medication recommendations. Will continue to provide education as needed

## 2017-03-11 NOTE — Progress Notes (Signed)
Occupational Therapy Treatment Patient Details Name: Jody Ruiz MRN: [id number] DOB: [birth year] Today's Date: 03/11/2017    History of present illness Jody Ruiz is a 76 y.o. female with a h/o recent clinical decline.  She has been diagnosed with rapidly progressive glomerulonephritis ANA vasculitis and has required steroids and rituximab which she has not well tolerated.  She also CAD s/p prior PCI and interstitial lung disease.  She is admitted with afib with RVR.  Of note, she has recently also had sinus bradycardia and pauses of 3.3 seconds with afib termination.      Follow Up Recommendations  Home health OT;SNF;Supervision/Assistance - 24 hour          Precautions / Restrictions Precautions Precautions: Fall Precaution Comments: pt denies falls, but holds furniture at home Restrictions Weight Bearing Restrictions: No       Mobility Bed Mobility Overal bed mobility: Needs Assistance Bed Mobility: Supine to Sit;Sit to Supine     Supine to sit: Supervision Sit to supine: Supervision      Transfers Overall transfer level: Needs assistance Equipment used: Rolling walker (2 wheeled) Transfers: Sit to/from [facility] Sit to Stand: Min assist Stand pivot transfers: Min assist            Balance Overall balance assessment: Needs assistance Sitting-balance support: No upper extremity supported;Feet supported Sitting balance-Leahy Scale: Fair         Standing balance comment: able to stand without UE support                           ADL either performed or assessed with clinical judgement   ADL Overall ADL's : Needs assistance/impaired                         Toilet Transfer: Minimal assistance;BSC;RW;Cueing for safety;Cueing for sequencing;Stand-pivot   Toileting- Clothing Manipulation and Hygiene: Moderate assistance;Sit to/from stand;Cueing for safety;Cueing for sequencing         General ADL  Comments: pt refused to get to chair after sitting on BSC. Dghter, RN and OT all encouraged pt     Vision Patient Visual Report: No change from baseline            Cognition Arousal/Alertness: Awake/alert Behavior During Therapy: Flat affect Overall Cognitive Status: Within Functional Limits for tasks assessed                                                     Pertinent Vitals/ Pain       Pain Assessment: No/denies pain     Prior Functioning/Environment              Frequency  Min 2X/week        Progress Toward Goals  OT Goals(current goals can now be found in the care plan section)  Progress towards OT goals: Not progressing toward goals - comment (pt with limited participation)     Plan Discharge plan remains appropriate    Co-evaluation                 AM-PAC PT "6 Clicks" Daily Activity     Outcome Measure   Help from another person eating meals?: A Little Help from another person taking care of personal grooming?: A Little Help  from another person toileting, which includes using toliet, bedpan, or urinal?: A Lot Help from another person bathing (including washing, rinsing, drying)?: A Lot Help from another person to put on and taking off regular upper body clothing?: A Little Help from another person to put on and taking off regular lower body clothing?: A Lot 6 Click Score: 15    End of Session    OT Visit Diagnosis: Unsteadiness on feet (R26.81);Repeated falls (R29.6);Muscle weakness (generalized) (M62.81)   Activity Tolerance Other (comment) (pt self limiting)   Patient Left in bed;with call bell/phone within reach;with chair alarm set;with family/visitor present   Nurse Communication Mobility status        Time: 1510-1543 OT Time Calculation (min): 33 min  Charges: OT General Charges $OT Visit: 1 Visit OT Treatments $Self Care/Home Management : 23-37 mins  [place], [place]   Payton Mccallum D 03/11/2017, 4:16 PM

## 2017-03-11 NOTE — Progress Notes (Signed)
OT Cancellation Note  Patient Details Name: NANIE DUNKLEBERGER MRN: [id number] DOB: September 02, 1940   Cancelled Treatment:    Reason Eval/Treat Not Completed: Other (comment) Pt declined OT as she wanted to take a nap . Granddaughter also said has taken meds in which she was suppose to to wait 2 hours prior to activity. Will check back as schedule allows.  Mickel Baas [place], [place] 03/11/2017, 12:23 PM

## 2017-03-11 NOTE — Care Management Important Message (Signed)
Important Message  Patient Details  Name: TAHNEE CIFUENTES MRN: [id number] Date of Birth: 1940-09-27   Medicare Important Message Given:  Yes    Kerin Salen 03/11/2017, 10:37 AMImportant Message  Patient Details  Name: PAYTIENCE BURES MRN: [id number] Date of Birth: 1941-01-05   Medicare Important Message Given:  Yes    Kerin Salen 03/11/2017, 10:37 AM

## 2017-03-11 NOTE — Progress Notes (Signed)
PROGRESS NOTE                                                                                                                                                                                                             Patient Demographics:    Jody Ruiz, is a 76 y.o. female, DOB - [birth year], PJA:[id number]  Admit date - 03/04/2017   Admitting Physician Reyne Dumas, MD  Outpatient Primary MD for the patient is Burnard Bunting, MD  LOS - 7  Outpatient Specialists: Cardiology (Dr. Martinique)  Chief Complaint  Patient presents with  . Generalized Weakness       Brief Narrative   76 year old female with chronic A. 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 In the ED she was found to be in rapid A. fib and admitted to telemetry.   Subjective:   Patient's Cardizem dose was increased yesterday for episode of rapid A. fib in the morning and order for digoxin (which she refused stating she does not like additional medications) . heart rate in 120s this morning.   Assessment  & Plan :    Principal Problem:   Atrial fibrillation with RVR (HCC) Heart rate better controlled but still in 120s this morning. Patient refused digoxin. I have increased her Cardizem dose further to 360 mg today. Cardiogenic recommends to continue Cardizem for rate control. Continue Coumadin. 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 Given her poor clinical condition not a candidate for ablation, antiarrhythmics or pacing.Marland Kitchen She will follow up with Dr. Lovena Le as outpatient. Keep k>4 and mg>2. TSH normal. Troponin mildly elevated due to demand ischemia.   Active Problems: Depression Seen by psychiatry who recommended starting Remeron for insomnia, mood and anxiety but patient refused. Per psychiatry patient HAS capacity for making complex medical  decisions.  Acute on Chronic kidney disease stage III with ANCA vasculitis and RPGN. Worsened renal function which seems to be improving in a.m. labs. On empiric Cipro with concern for UTI. Continue Rituxan. Original consult appreciated.  Fall Suspect due to deconditioning and? Orthostasis. PT recommends home health.  Idiopathic pulmonary fibrosis Continue chronic prednisone  Essential hypertension Stable. Continue current medications.  B12 deficiency continue weekly injections as outpatient.  Chronic diastolic CHF. Table and Cardizem. Home metoprolol discontinued.   Diabetes mellitus with  complication (HCC) Stable. Monitor on sliding scale coverage.    Protein-calorie malnutrition, severe Continue supplement.   Goals of care discussion Was discussed by my partner earlier this week. Patient wanted to remain full code and continue aggressive care for now. She refused SNF and wanted to go home.   Code Status : Full code  Family Communication  : Daughter at bedside  Disposition Plan  : Home possibly tomorrow if heart rate controlled and renal function stable.  Barriers For Discharge : Active symptoms  Consults  :  Cardiology Nephrology  Procedures  : None  DVT Prophylaxis  :  Lovenox -  Lab Results  Component Value Date   PLT 223 03/11/2017    Antibiotics  :    Anti-infectives    Start     Dose/Rate Route Frequency Ordered Stop   03/10/17 0800  ciprofloxacin (CIPRO) tablet 500 mg  Status:  Discontinued     500 mg Oral Daily with breakfast 03/09/17 1108 03/09/17 1227   03/09/17 2000  ciprofloxacin (CIPRO) tablet 500 mg     500 mg Oral Every 24 hours 03/09/17 1227 03/12/17 1959   03/06/17 1300  cefTRIAXone (ROCEPHIN) 1 g in dextrose 5 % 50 mL IVPB  Status:  Discontinued     1 g 100 mL/hr over 30 Minutes Intravenous Every 24 hours 03/06/17 1212 03/06/17 1311        Objective:   Vitals:   03/10/17 2017 03/11/17 0455 03/11/17 1018 03/11/17 1353  BP:  (!)  159/83 123/69 129/72  Pulse:  77 92 95  Resp: 18 18 18 18   Temp:  98 F (36.7 C)  98 F (36.7 C)  TempSrc:  Oral  Oral  SpO2:  98%  96%  Weight:      Height:        Wt Readings from Last 3 Encounters:  03/07/17 81.5 kg (179 lb 10.8 oz)  03/02/17 84.1 kg (185 lb 4.8 oz)  02/18/17 87.1 kg (192 lb)     Intake/Output Summary (Last 24 hours) at 03/11/17 1504 Last data filed at 03/11/17 1353  Gross per 24 hour  Intake              240 ml  Output              400 ml  Net             -160 ml     Physical Exam  Gen: not in distress HEENT: moist mucosa, supple neck Chest: clear b/l, no added sounds CVS: S1&S2 irregular, no murmurs GI: soft, NT, ND Musculoskeletal: warm, no edema     Data Review:    CBC  Recent Labs Lab 03/07/17 1908 03/08/17 1504 03/09/17 0439 03/10/17 0420 03/11/17 0519  WBC 8.3 9.8 8.9 10.6* 12.8*  HGB 8.3* 8.7* 8.5* 8.6* 8.6*  HCT 25.2* 26.6* 25.7* 25.5* 26.2*  PLT 220 219 194 208 223  MCV 94.4 95.0 92.1 92.1 94.6  MCH 31.1 31.1 30.5 31.0 31.0  MCHC 32.9 32.7 33.1 33.7 32.8  RDW 17.1* 16.9* 16.3* 16.3* 16.7*  LYMPHSABS 0.6*  --   --   --   --   MONOABS 0.2  --   --   --   --   EOSABS 0.0  --   --   --   --   BASOSABS 0.0  --   --   --   --     Chemistries   Recent Labs Lab 03/06/17  3474 03/07/17 0537 03/07/17 1908 03/08/17 1504 03/09/17 0439 03/10/17 0420 03/11/17 0519  NA  --  143 139 140 142 141 138  K  --  4.2 5.0 4.7 4.6 4.6 4.8  CL  --  111 108 108 110 110 106  CO2  --  25 24 22 24 23 25   GLUCOSE  --  123* 269* 251* 146* 151* 152*  BUN  --  64* 66* 65* 67* 64* 69*  CREATININE  --  3.12* 3.26* 3.35* 3.22* 2.95* 3.25*  CALCIUM  --  8.5* 8.3* 8.4* 8.5* 8.6* 8.6*  MG 1.6*  --  2.0 2.0  --   --   --   AST  --  23  --   --   --   --   --   ALT  --  53  --   --   --   --   --   ALKPHOS  --  63  --   --   --   --   --   BILITOT  --  1.1  --   --   --   --   --     ------------------------------------------------------------------------------------------------------------------ No results for input(s): CHOL, HDL, LDLCALC, TRIG, CHOLHDL, LDLDIRECT in the last 72 hours.  Lab Results  Component Value Date   HGBA1C 5.8 (H) 03/06/2017   ------------------------------------------------------------------------------------------------------------------ No results for input(s): TSH, T4TOTAL, T3FREE, THYROIDAB in the last 72 hours.  Invalid input(s): FREET3 ------------------------------------------------------------------------------------------------------------------ No results for input(s): VITAMINB12, FOLATE, FERRITIN, TIBC, IRON, RETICCTPCT in the last 72 hours.  Coagulation profile  Recent Labs Lab 03/07/17 0537 03/08/17 0340 03/09/17 0439 03/10/17 0420 03/11/17 0519  INR 2.09 1.82 2.14 2.47 2.75    No results for input(s): DDIMER in the last 72 hours.  Cardiac Enzymes  Recent Labs Lab 03/06/17 0951  TROPONINI 0.05*   ------------------------------------------------------------------------------------------------------------------    Component Value Date/Time   BNP 65.3 11/05/2016 0755    Inpatient Medications  Scheduled Meds: . aspirin EC  81 mg Oral Daily  . ciprofloxacin  500 mg Oral Q24H  . [START ON 03/17/2017] cyanocobalamin  1,000 mcg Subcutaneous Weekly  . digoxin  0.0625 mg Oral Daily  . diltiazem  360 mg Oral Daily  . famotidine  20 mg Oral Daily  . insulin aspart  0-5 Units Subcutaneous QHS  . insulin aspart  0-9 Units Subcutaneous TID WC  . polyethylene glycol  17 g Oral Daily  . [START ON 04/10/2017] predniSONE  10 mg Oral Q breakfast  . [START ON 03/27/2017] predniSONE  20 mg Oral Q breakfast  . [START ON 03/13/2017] predniSONE  30 mg Oral Q breakfast  . predniSONE  40 mg Oral Q breakfast  . [START ON 04/24/2017] predniSONE  5 mg Oral Q breakfast  . [START ON 05/08/2017] predniSONE  5 mg Oral Q48H  .  saccharomyces boulardii  250 mg Oral BID  . sodium chloride flush  3 mL Intravenous Q12H  . warfarin  0.5 mg Oral ONCE-1800  . Warfarin - Pharmacist Dosing Inpatient   Does not apply q1800   Continuous Infusions: . sodium chloride Stopped (03/06/17 1900)   PRN Meds:.sodium chloride, acetaminophen **OR** acetaminophen, nitroGLYCERIN, [DISCONTINUED] ondansetron **OR** ondansetron (ZOFRAN) IV, ondansetron, polyvinyl alcohol, sodium chloride flush, traMADol  Micro Results Recent Results (from the past 240 hour(s))  Culture, Urine     Status: Abnormal   Collection Time: 03/05/17 11:51 PM  Result Value Ref Range Status   Specimen Description URINE, CLEAN  CATCH  Final   Special Requests NONE  Final   Culture (A)  Final    20,000 COLONIES/mL CITROBACTER FREUNDII 20,000 COLONIES/mL KLEBSIELLA OXYTOCA    Report Status 03/09/2017 FINAL  Final   Organism ID, Bacteria CITROBACTER FREUNDII (A)  Final   Organism ID, Bacteria KLEBSIELLA OXYTOCA (A)  Final      Susceptibility   Citrobacter freundii - MIC*    CEFAZOLIN >=64 RESISTANT Resistant     CEFTRIAXONE <=1 SENSITIVE Sensitive     CIPROFLOXACIN <=0.25 SENSITIVE Sensitive     GENTAMICIN <=1 SENSITIVE Sensitive     IMIPENEM <=0.25 SENSITIVE Sensitive     NITROFURANTOIN <=16 SENSITIVE Sensitive     TRIMETH/SULFA <=20 SENSITIVE Sensitive     PIP/TAZO <=4 SENSITIVE Sensitive     * 20,000 COLONIES/mL CITROBACTER FREUNDII   Klebsiella oxytoca - MIC*    AMPICILLIN >=32 RESISTANT Resistant     CEFAZOLIN 8 SENSITIVE Sensitive     CEFTRIAXONE <=1 SENSITIVE Sensitive     CIPROFLOXACIN <=0.25 SENSITIVE Sensitive     GENTAMICIN <=1 SENSITIVE Sensitive     IMIPENEM <=0.25 SENSITIVE Sensitive     NITROFURANTOIN <=16 SENSITIVE Sensitive     TRIMETH/SULFA <=20 SENSITIVE Sensitive     AMPICILLIN/SULBACTAM 16 INTERMEDIATE Intermediate     PIP/TAZO <=4 SENSITIVE Sensitive     Extended ESBL NEGATIVE Sensitive     * 20,000 COLONIES/mL KLEBSIELLA OXYTOCA    MRSA PCR Screening     Status: None   Collection Time: 03/07/17  8:46 PM  Result Value Ref Range Status   MRSA by PCR NEGATIVE NEGATIVE Final    Comment:        The GeneXpert MRSA Assay (FDA approved for NASAL specimens only), is one component of a comprehensive MRSA colonization surveillance program. It is not intended to diagnose MRSA infection nor to guide or monitor treatment for MRSA infections.     Radiology Reports Dg Chest 2 View  Result Date: 03/04/2017 CLINICAL DATA:  Initial evaluation for acute weakness. EXAM: CHEST  2 VIEW COMPARISON:  Prior radiograph from 02/26/2017. FINDINGS: Cardiac and mediastinal silhouettes are stable in size and contour, and remain within normal limits. Elevation of the right hemidiaphragm, stable from previous. No focal infiltrate, pulmonary edema, or pleural effusion. No pneumothorax. No acute osseus abnormality. IMPRESSION: 1. No active cardiopulmonary disease. 2. Elevation of the right hemidiaphragm, similar to prior, and likely chronic. Electronically Signed   By: Jeannine Boga M.D.   On: 03/04/2017 13:46   Dg Chest 2 View  Result Date: 02/26/2017 CLINICAL DATA:  Chronic renal failure. The patient has been lethargic. EXAM: CHEST  2 VIEW COMPARISON:  January 16, 2017 FINDINGS: Elevation of the right hemidiaphragm persists. The heart, hila, mediastinum, lungs, and pleura are otherwise normal. No acute interval changes. IMPRESSION: No active cardiopulmonary disease. Electronically Signed   By: Dorise Bullion III M.D   On: 02/26/2017 12:39    Time Spent in minutes 35   Louellen Molder M.D on 03/11/2017 at 3:04 PM  Between 7am to 7pm - Pager - [phone]  After 7pm go to [email or web address] - password [facility]  [facility] -  Office  [phone]

## 2017-03-11 NOTE — Progress Notes (Signed)
[place] for warfarin Indication: atrial fibrillation  Allergies  Allergen Reactions  . Brilinta [Ticagrelor] Shortness Of Breath, Other (See Comments) and Cough    Reaction:  Fatigue  . Lisinopril Shortness Of Breath and Cough  . Statins Other (See Comments)    Reaction:  Muscle pain/weakness  . Zetia [Ezetimibe] Other (See Comments)    Reaction:  Muscle pain/weakness   . Macrodantin [Nitrofurantoin Macrocrystal] Rash  . Sulfa Antibiotics Rash    Patient Measurements: Height: 5' 7.01" (170.2 cm) Weight: 179 lb 10.8 oz (81.5 kg) IBW/kg (Calculated) : 61.62 Heparin Dosing Weight:   Vital Signs: Temp: 98 F (36.7 C) (09/05 0455) Temp Source: Oral (09/05 0455) BP: 159/83 (09/05 0455) Pulse Rate: 77 (09/05 0455)  Labs:  Recent Labs  03/09/17 0439 03/10/17 0420 03/11/17 0519  HGB 8.5* 8.6* 8.6*  HCT 25.7* 25.5* 26.2*  PLT 194 208 223  LABPROT 23.7* 26.5* 28.9*  INR 2.14 2.47 2.75  CREATININE 3.22* 2.95* 3.25*    Estimated Creatinine Clearance: 16.7 mL/min (A) (by C-G formula based on SCr of 3.25 mg/dL (H)).   Medications:  - Home warfarin: 1.25mg  MWF and 2.5mg  TTSS (last dose 8/28)  Assessment: Patient's a 76 y.o F with hx afib on warfarin PTA presented the ED on 03/04/17 with AMS.  Warfarin resumed on admission. Note PMH CAD s/p recent stents, on ASA.  Today, 03/11/2017: - INR therapeutic at 2.75 (trending up) - Hgb low but stable, Plts wnl - No bleeding documented - Concurrent aspirin 81mg  noted - Cipro ordered which would be a significant drug interaction, however, patient currently refusing - Renal diet ordered, no intake documented over past 24 hours   Goal of Therapy:  INR 2-3 Monitor platelets by anticoagulation protocol: Yes   Plan:  - decrease warfarin 0.5 mg PO x1 today (to prevent further increase trend into supratherapeutic range) - monitor for s/s bleeding - daily INR  Peggyann Juba, PharmD,  BCPS Pager: [phone] 03/11/2017, 7:03 AM

## 2017-03-11 NOTE — Care Management Note (Signed)
Case Management Note  Patient Details  Name: Jody Ruiz MRN: [id number] Date of Birth: 1940-09-21  Subjective/Objective:    Ordered for HHC-HHRN/PT/OT-spoke to patient in rm she defers to discuss [place] w/dtr-Nurse aware to contact CM when dtr comes into rm today-await choice.                Action/Plan:d/c home w/HHC.   Expected Discharge Date:   (unknown)               Expected Discharge Plan:  [place]  In-House Referral:     Discharge planning Services  CM Consult  Post Acute Care Choice:    Choice offered to:  Patient  DME Arranged:    DME Agency:     HH Arranged:    [place] Agency:     Status of Service:  In process, will continue to follow  If discussed at Long Length of Stay Meetings, dates discussed:    Additional Comments:  Dessa Phi, RN 03/11/2017, 1:25 PM

## 2017-03-11 NOTE — Progress Notes (Signed)
[facility] Progress Note  Subjective: no c/o  Vitals:   03/10/17 2007 03/10/17 2017 03/11/17 0455 03/11/17 1018  BP: (!) 114/54  (!) 159/83 123/69  Pulse: 76  77 92  Resp: 18 18 18 18   Temp: 98.2 F (36.8 C)  98 F (36.7 C)   TempSrc: Oral  Oral   SpO2: 98%  98%   Weight:      Height:        Inpatient medications: . aspirin EC  81 mg Oral Daily  . ciprofloxacin  500 mg Oral Q24H  . [START ON 03/17/2017] cyanocobalamin  1,000 mcg Subcutaneous Weekly  . digoxin  0.0625 mg Oral Daily  . diltiazem  360 mg Oral Daily  . famotidine  20 mg Oral Daily  . insulin aspart  0-5 Units Subcutaneous QHS  . insulin aspart  0-9 Units Subcutaneous TID WC  . polyethylene glycol  17 g Oral Daily  . [START ON 04/10/2017] predniSONE  10 mg Oral Q breakfast  . [START ON 03/27/2017] predniSONE  20 mg Oral Q breakfast  . [START ON 03/13/2017] predniSONE  30 mg Oral Q breakfast  . predniSONE  40 mg Oral Q breakfast  . [START ON 04/24/2017] predniSONE  5 mg Oral Q breakfast  . [START ON 05/08/2017] predniSONE  5 mg Oral Q48H  . saccharomyces boulardii  250 mg Oral BID  . sodium chloride flush  3 mL Intravenous Q12H  . warfarin  0.5 mg Oral ONCE-1800  . Warfarin - Pharmacist Dosing Inpatient   Does not apply q1800   . sodium chloride Stopped (03/06/17 1900)   sodium chloride, acetaminophen **OR** acetaminophen, nitroGLYCERIN, [DISCONTINUED] ondansetron **OR** ondansetron (ZOFRAN) IV, ondansetron, polyvinyl alcohol, sodium chloride flush, traMADol  Exam: Gen alert, no distress No rash, cyanosis or gangrene Sclera anicteric, throat clear  No jvd or bruits Chest clear bilat RRR no MRG Abd soft ntnd no mass or ascites +bs GU defer MS no joint effusions or deformity Ext no LE edema / no wounds or ulcers Neuro is alert, Ox 3 , nf      Impression: 1.  ANCA GN / CKD stage IV - by biopsy 7/18 with 50% fibrocellular crescents and minimal IS fibrosis. Has rec'd her 2nd (of 4)  Rituximab infusions yesterday w/o any side effects.  Creat stable in low 3's range.  2.  Afib with RVR - had episode uncont afib when working w/ PT yest am.  Po dilt being titrated up, stable today.  Per cards/primary. 3.  HTN - bp's are normal, on po dilt only , home MTP dc'd 4.  Vol is euvolemic. Will dc her renal diet,  fluid restriction and daily bmet.  5.  IDDM  Rec - will follow   Kelly Splinter MD [facility] pager [phone]   03/11/2017, 12:33 PM    Recent Labs Lab 03/09/17 0439 03/10/17 0420 03/11/17 0519  NA 142 141 138  K 4.6 4.6 4.8  CL 110 110 106  CO2 24 23 25   GLUCOSE 146* 151* 152*  BUN 67* 64* 69*  CREATININE 3.22* 2.95* 3.25*  CALCIUM 8.5* 8.6* 8.6*    Recent Labs Lab 03/04/17 1313 03/07/17 0537  AST 27 23  ALT 54 53  ALKPHOS 62 63  BILITOT 1.3* 1.1  PROT 5.5* 5.3*  ALBUMIN 3.0* 2.9*    Recent Labs Lab 03/04/17 1313  03/07/17 1908  03/09/17 0439 03/10/17 0420 03/11/17 0519  WBC 10.2  < > 8.3  < >  8.9 10.6* 12.8*  NEUTROABS 9.4*  --  7.6  --   --   --   --   HGB 8.9*  < > 8.3*  < > 8.5* 8.6* 8.6*  HCT 25.0*  < > 25.2*  < > 25.7* 25.5* 26.2*  MCV 93.3  < > 94.4  < > 92.1 92.1 94.6  PLT 250  < > 220  < > 194 208 223  < > = values in this interval not displayed. Iron/TIBC/Ferritin/ %Sat    Component Value Date/Time   IRON 109 02/27/2017 1230   TIBC 255 02/27/2017 1230   FERRITIN 877 (H) 02/27/2017 1230   IRONPCTSAT 43 (H) 02/27/2017 1230

## 2017-03-12 ENCOUNTER — Inpatient Hospital Stay (HOSPITAL_COMMUNITY): Payer: Medicare Other

## 2017-03-12 DIAGNOSIS — F4323 Adjustment disorder with mixed anxiety and depressed mood: Secondary | ICD-10-CM

## 2017-03-12 DIAGNOSIS — F0631 Mood disorder due to known physiological condition with depressive features: Secondary | ICD-10-CM

## 2017-03-12 DIAGNOSIS — R112 Nausea with vomiting, unspecified: Secondary | ICD-10-CM

## 2017-03-12 LAB — CBC
HEMATOCRIT: 24.8 % — AB (ref 36.0–46.0)
Hemoglobin: 8.3 g/dL — ABNORMAL LOW (ref 12.0–15.0)
MCH: 31.8 pg (ref 26.0–34.0)
MCHC: 33.5 g/dL (ref 30.0–36.0)
MCV: 95 fL (ref 78.0–100.0)
PLATELETS: 193 10*3/uL (ref 150–400)
RBC: 2.61 MIL/uL — AB (ref 3.87–5.11)
RDW: 16.7 % — ABNORMAL HIGH (ref 11.5–15.5)
WBC: 12.2 10*3/uL — AB (ref 4.0–10.5)

## 2017-03-12 LAB — PROTIME-INR
INR: 2.8
Prothrombin Time: 29.3 seconds — ABNORMAL HIGH (ref 11.4–15.2)

## 2017-03-12 LAB — BASIC METABOLIC PANEL
Anion gap: 6 (ref 5–15)
BUN: 75 mg/dL — AB (ref 6–20)
CO2: 25 mmol/L (ref 22–32)
Calcium: 8.3 mg/dL — ABNORMAL LOW (ref 8.9–10.3)
Chloride: 109 mmol/L (ref 101–111)
Creatinine, Ser: 3.05 mg/dL — ABNORMAL HIGH (ref 0.44–1.00)
GFR calc Af Amer: 16 mL/min — ABNORMAL LOW (ref >60)
GFR calc non Af Amer: 14 mL/min — ABNORMAL LOW (ref >60)
Glucose, Bld: 171 mg/dL — ABNORMAL HIGH (ref 65–99)
POTASSIUM: 4.7 mmol/L (ref 3.5–5.1)
Sodium: 140 mmol/L (ref 135–145)

## 2017-03-12 LAB — GLUCOSE, CAPILLARY
GLUCOSE-CAPILLARY: 216 mg/dL — AB (ref 65–99)
GLUCOSE-CAPILLARY: 238 mg/dL — AB (ref 65–99)
Glucose-Capillary: 122 mg/dL — ABNORMAL HIGH (ref 65–99)
Glucose-Capillary: 176 mg/dL — ABNORMAL HIGH (ref 65–99)

## 2017-03-12 MED ORDER — BISACODYL 10 MG RE SUPP
10.0000 mg | Freq: Once | RECTAL | Status: AC
  Administered 2017-03-12: 10 mg via RECTAL
  Filled 2017-03-12: qty 1

## 2017-03-12 MED ORDER — WARFARIN 0.5 MG HALF TABLET
0.5000 mg | ORAL_TABLET | Freq: Once | ORAL | Status: AC
  Administered 2017-03-12: 0.5 mg via ORAL
  Filled 2017-03-12: qty 1

## 2017-03-12 MED ORDER — BISACODYL 10 MG RE SUPP
10.0000 mg | Freq: Once | RECTAL | Status: AC
  Administered 2017-03-12: 10 mg via RECTAL

## 2017-03-12 NOTE — Progress Notes (Signed)
PROGRESS NOTE                                                                                                                                                                                                             Patient Demographics:    Jody Ruiz, is a 76 y.o. female, DOB - 04-22-1941, ZOX:[id number]  Admit date - 03/04/2017   Admitting Physician Reyne Dumas, MD  Outpatient Primary MD for the patient is Burnard Bunting, MD  LOS - 8  Outpatient Specialists: Cardiology (Dr. Martinique)  Chief Complaint  Patient presents with  . Generalized Weakness       Brief Narrative   76 year old female with chronic A. 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 In the ED she was found to be in rapid A. fib and admitted to telemetry.   Subjective:   Heart rate mostly stable except for occasional episodes of tachycardia in 120s (increased up to 140s on 2 occasions). Patient has very flat affect and complaining of nausea and vomiting this morning. Denies abdominal pain. She has not had a bowel movement in last few days.   Assessment  & Plan :    Principal Problem:   Atrial fibrillation with RVR (HCC) Heart rate better controlled after increasing Cardizem dose (occasionally was up to 120s). Refused digoxin. Discussed with Dr. Johnsie Cancel on the phone. Recommended not to increase the rate limiting agents further as patient has had bradycardia in the past and that patient has been asymptomatic on occasional elevated heart rate.. Continue Coumadin.  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 Given her poor clinical condition not a candidate for ablation, antiarrhythmics or pacing.Marland Kitchen She will follow up with Dr. Lovena Le as outpatient. Keep k>4 and mg>2. TSH normal. Troponin mildly elevated due to demand ischemia.   Active Problems: Severe  depression. Seen by psychiatry who recommended starting Remeron for insomnia, mood and anxiety but patient refused. Per psychiatry patient HAS capacity for making complex medical decisions.  Acute on Chronic kidney disease stage III with ANCA vasculitis and RPGN. Worsened renal function which is slowly improving.. On empiric Cipro with concern for UTI. (We will discontinue after 5 day course tomorrow) Continue Rituxan. Nephrology following.  Fall Suspect due to deconditioning and? Orthostasis. PT recommends home health.  Idiopathic pulmonary fibrosis  Continue chronic prednisone (being tapered every 2 weeks.)  Essential hypertension Stable. Continue current medications.  B12 deficiency continue weekly injections as outpatient.  Chronic diastolic CHF. Stable on Cardizem. Home dose metoprolol discontinued.   Diabetes mellitus with complication (HCC) Stable on sliding scale coverage.    Protein-calorie malnutrition, severe Continue supplement.  Nausea and vomiting. Possibly due to constipation. Ordered Dulcolax suppository. Abdomen is benign on exam. Check abdominal x-ray.  Goals of care discussion Was discussed by my partner earlier this week. Patient wanted to remain full code and continue full scope of treatment. She refused SNF and wanted to go home. I discussed goals of care again and possibility of involving palliative care. Patient told me that she has already discussed goals of care with Dr. Posey Pronto early this week.   Code Status : Full code  Family Communication  : Daughter at bedside  Disposition Plan  : Home tomorrow if heart are stable on monitor and weakness improves.  Barriers For Discharge : Active symptoms  Consults  :  Cardiology Nephrology  Procedures  : None  DVT Prophylaxis  :  Lovenox -  Lab Results  Component Value Date   PLT 193 03/12/2017    Antibiotics  :    Anti-infectives    Start     Dose/Rate Route Frequency Ordered Stop   03/10/17  0800  ciprofloxacin (CIPRO) tablet 500 mg  Status:  Discontinued     500 mg Oral Daily with breakfast 03/09/17 1108 03/09/17 1227   03/09/17 2000  ciprofloxacin (CIPRO) tablet 500 mg     500 mg Oral Every 24 hours 03/09/17 1227 03/12/17 1959   03/06/17 1300  cefTRIAXone (ROCEPHIN) 1 g in dextrose 5 % 50 mL IVPB  Status:  Discontinued     1 g 100 mL/hr over 30 Minutes Intravenous Every 24 hours 03/06/17 1212 03/06/17 1311        Objective:   Vitals:   03/12/17 0740 03/12/17 0742 03/12/17 0745 03/12/17 1121  BP:   136/73 (!) 129/52  Pulse:   96 62  Resp: 12  16   Temp:      TempSrc:      SpO2: (!) 85% 97% 100%   Weight:      Height:        Wt Readings from Last 3 Encounters:  03/07/17 81.5 kg (179 lb 10.8 oz)  03/02/17 84.1 kg (185 lb 4.8 oz)  02/18/17 87.1 kg (192 lb)     Intake/Output Summary (Last 24 hours) at 03/12/17 1309 Last data filed at 03/11/17 2113  Gross per 24 hour  Intake              120 ml  Output              500 ml  Net             -380 ml     Physical Exam General: Appears fatigued, has flat aphasic HEENT: Moist mucosa, supple neck Chest: Clear bilaterally, no added sounds CVS: S1 and S2 irregular, no murmurs rubs or gallop GI: Soft, nondistended, nontender, bowel sounds present Musculoskeletal: Warm, no edema     Data Review:    CBC  Recent Labs Lab 03/07/17 1908 03/08/17 1504 03/09/17 0439 03/10/17 0420 03/11/17 0519 03/12/17 0428  WBC 8.3 9.8 8.9 10.6* 12.8* 12.2*  HGB 8.3* 8.7* 8.5* 8.6* 8.6* 8.3*  HCT 25.2* 26.6* 25.7* 25.5* 26.2* 24.8*  PLT 220 219 194 208 223 193  MCV 94.4  95.0 92.1 92.1 94.6 95.0  MCH 31.1 31.1 30.5 31.0 31.0 31.8  MCHC 32.9 32.7 33.1 33.7 32.8 33.5  RDW 17.1* 16.9* 16.3* 16.3* 16.7* 16.7*  LYMPHSABS 0.6*  --   --   --   --   --   MONOABS 0.2  --   --   --   --   --   EOSABS 0.0  --   --   --   --   --   BASOSABS 0.0  --   --   --   --   --     Chemistries   Recent Labs Lab 03/06/17 0951  03/07/17 0537 03/07/17 1908 03/08/17 1504 03/09/17 0439 03/10/17 0420 03/11/17 0519 03/12/17 0428  NA  --  143 139 140 142 141 138 140  K  --  4.2 5.0 4.7 4.6 4.6 4.8 4.7  CL  --  111 108 108 110 110 106 109  CO2  --  25 24 22 24 23 25 25   GLUCOSE  --  123* 269* 251* 146* 151* 152* 171*  BUN  --  64* 66* 65* 67* 64* 69* 75*  CREATININE  --  3.12* 3.26* 3.35* 3.22* 2.95* 3.25* 3.05*  CALCIUM  --  8.5* 8.3* 8.4* 8.5* 8.6* 8.6* 8.3*  MG 1.6*  --  2.0 2.0  --   --   --   --   AST  --  23  --   --   --   --   --   --   ALT  --  53  --   --   --   --   --   --   ALKPHOS  --  63  --   --   --   --   --   --   BILITOT  --  1.1  --   --   --   --   --   --    ------------------------------------------------------------------------------------------------------------------ No results for input(s): CHOL, HDL, LDLCALC, TRIG, CHOLHDL, LDLDIRECT in the last 72 hours.  Lab Results  Component Value Date   HGBA1C 5.8 (H) 03/06/2017   ------------------------------------------------------------------------------------------------------------------ No results for input(s): TSH, T4TOTAL, T3FREE, THYROIDAB in the last 72 hours.  Invalid input(s): FREET3 ------------------------------------------------------------------------------------------------------------------ No results for input(s): VITAMINB12, FOLATE, FERRITIN, TIBC, IRON, RETICCTPCT in the last 72 hours.  Coagulation profile  Recent Labs Lab 03/08/17 0340 03/09/17 0439 03/10/17 0420 03/11/17 0519 03/12/17 0428  INR 1.82 2.14 2.47 2.75 2.80    No results for input(s): DDIMER in the last 72 hours.  Cardiac Enzymes  Recent Labs Lab 03/06/17 0951  TROPONINI 0.05*   ------------------------------------------------------------------------------------------------------------------    Component Value Date/Time   BNP 65.3 11/05/2016 0755    Inpatient Medications  Scheduled Meds: . aspirin EC  81 mg Oral Daily  .  ciprofloxacin  500 mg Oral Q24H  . [START ON 03/17/2017] cyanocobalamin  1,000 mcg Subcutaneous Weekly  . diltiazem  360 mg Oral Daily  . famotidine  20 mg Oral Daily  . insulin aspart  0-5 Units Subcutaneous QHS  . insulin aspart  0-9 Units Subcutaneous TID WC  . polyethylene glycol  17 g Oral Daily  . [START ON 04/10/2017] predniSONE  10 mg Oral Q breakfast  . [START ON 03/27/2017] predniSONE  20 mg Oral Q breakfast  . [START ON 03/13/2017] predniSONE  30 mg Oral Q breakfast  . predniSONE  40 mg Oral Q breakfast  . [START ON 04/24/2017]  predniSONE  5 mg Oral Q breakfast  . [START ON 05/08/2017] predniSONE  5 mg Oral Q48H  . saccharomyces boulardii  250 mg Oral BID  . sodium chloride flush  3 mL Intravenous Q12H  . Warfarin - Pharmacist Dosing Inpatient   Does not apply q1800   Continuous Infusions: . sodium chloride Stopped (03/06/17 1900)   PRN Meds:.sodium chloride, acetaminophen **OR** acetaminophen, nitroGLYCERIN, [DISCONTINUED] ondansetron **OR** ondansetron (ZOFRAN) IV, ondansetron, polyvinyl alcohol, sodium chloride flush, traMADol  Micro Results Recent Results (from the past 240 hour(s))  Culture, Urine     Status: Abnormal   Collection Time: 03/05/17 11:51 PM  Result Value Ref Range Status   Specimen Description URINE, CLEAN CATCH  Final   Special Requests NONE  Final   Culture (A)  Final    20,000 COLONIES/mL CITROBACTER FREUNDII 20,000 COLONIES/mL KLEBSIELLA OXYTOCA    Report Status 03/09/2017 FINAL  Final   Organism ID, Bacteria CITROBACTER FREUNDII (A)  Final   Organism ID, Bacteria KLEBSIELLA OXYTOCA (A)  Final      Susceptibility   Citrobacter freundii - MIC*    CEFAZOLIN >=64 RESISTANT Resistant     CEFTRIAXONE <=1 SENSITIVE Sensitive     CIPROFLOXACIN <=0.25 SENSITIVE Sensitive     GENTAMICIN <=1 SENSITIVE Sensitive     IMIPENEM <=0.25 SENSITIVE Sensitive     NITROFURANTOIN <=16 SENSITIVE Sensitive     TRIMETH/SULFA <=20 SENSITIVE Sensitive     PIP/TAZO <=4  SENSITIVE Sensitive     * 20,000 COLONIES/mL CITROBACTER FREUNDII   Klebsiella oxytoca - MIC*    AMPICILLIN >=32 RESISTANT Resistant     CEFAZOLIN 8 SENSITIVE Sensitive     CEFTRIAXONE <=1 SENSITIVE Sensitive     CIPROFLOXACIN <=0.25 SENSITIVE Sensitive     GENTAMICIN <=1 SENSITIVE Sensitive     IMIPENEM <=0.25 SENSITIVE Sensitive     NITROFURANTOIN <=16 SENSITIVE Sensitive     TRIMETH/SULFA <=20 SENSITIVE Sensitive     AMPICILLIN/SULBACTAM 16 INTERMEDIATE Intermediate     PIP/TAZO <=4 SENSITIVE Sensitive     Extended ESBL NEGATIVE Sensitive     * 20,000 COLONIES/mL KLEBSIELLA OXYTOCA  MRSA PCR Screening     Status: None   Collection Time: 03/07/17  8:46 PM  Result Value Ref Range Status   MRSA by PCR NEGATIVE NEGATIVE Final    Comment:        The GeneXpert MRSA Assay (FDA approved for NASAL specimens only), is one component of a comprehensive MRSA colonization surveillance program. It is not intended to diagnose MRSA infection nor to guide or monitor treatment for MRSA infections.     Radiology Reports Dg Chest 2 View  Result Date: 03/04/2017 CLINICAL DATA:  Initial evaluation for acute weakness. EXAM: CHEST  2 VIEW COMPARISON:  Prior radiograph from 02/26/2017. FINDINGS: Cardiac and mediastinal silhouettes are stable in size and contour, and remain within normal limits. Elevation of the right hemidiaphragm, stable from previous. No focal infiltrate, pulmonary edema, or pleural effusion. No pneumothorax. No acute osseus abnormality. IMPRESSION: 1. No active cardiopulmonary disease. 2. Elevation of the right hemidiaphragm, similar to prior, and likely chronic. Electronically Signed   By: Jeannine Boga M.D.   On: 03/04/2017 13:46   Dg Chest 2 View  Result Date: 02/26/2017 CLINICAL DATA:  Chronic renal failure. The patient has been lethargic. EXAM: CHEST  2 VIEW COMPARISON:  January 16, 2017 FINDINGS: Elevation of the right hemidiaphragm persists. The heart, hila,  mediastinum, lungs, and pleura are otherwise normal. No acute interval changes. IMPRESSION:  No active cardiopulmonary disease. Electronically Signed   By: Dorise Bullion III M.D   On: 02/26/2017 12:39    Time Spent in minutes 25   Louellen Molder M.D on 03/12/2017 at 1:09 PM  Between 7am to 7pm - Pager - [phone]  After 7pm go to [email or web address] - password [facility]  [facility] -  Office  [phone]

## 2017-03-12 NOTE — Progress Notes (Signed)
[facility] Progress Note  Subjective: having lots of nausea and vomiting.  No other c/o.  No SOB.    Vitals:   03/12/17 0739 03/12/17 0740 03/12/17 0742 03/12/17 0745  BP:    136/73  Pulse:    96  Resp:  12  16  Temp:      TempSrc:      SpO2: 98% (!) 85% 97% 100%  Weight:      Height:        Inpatient medications: . aspirin EC  81 mg Oral Daily  . bisacodyl  10 mg Rectal Once  . ciprofloxacin  500 mg Oral Q24H  . [START ON 03/17/2017] cyanocobalamin  1,000 mcg Subcutaneous Weekly  . diltiazem  360 mg Oral Daily  . famotidine  20 mg Oral Daily  . insulin aspart  0-5 Units Subcutaneous QHS  . insulin aspart  0-9 Units Subcutaneous TID WC  . polyethylene glycol  17 g Oral Daily  . [START ON 04/10/2017] predniSONE  10 mg Oral Q breakfast  . [START ON 03/27/2017] predniSONE  20 mg Oral Q breakfast  . [START ON 03/13/2017] predniSONE  30 mg Oral Q breakfast  . predniSONE  40 mg Oral Q breakfast  . [START ON 04/24/2017] predniSONE  5 mg Oral Q breakfast  . [START ON 05/08/2017] predniSONE  5 mg Oral Q48H  . saccharomyces boulardii  250 mg Oral BID  . sodium chloride flush  3 mL Intravenous Q12H  . Warfarin - Pharmacist Dosing Inpatient   Does not apply q1800   . sodium chloride Stopped (03/06/17 1900)   sodium chloride, acetaminophen **OR** acetaminophen, nitroGLYCERIN, [DISCONTINUED] ondansetron **OR** ondansetron (ZOFRAN) IV, ondansetron, polyvinyl alcohol, sodium chloride flush, traMADol  Exam: Gen alert, no distress,appears tired No rash, cyanosis or gangrene Sclera anicteric, throat clear  No jvd or bruits Chest clear bilat RRR no MRG Abd soft ntnd no mass or ascites +bs Ext no LE edema Neuro is alert, Ox 3 , nf      Impression: 1.  ANCA GN / CKD stage IV - by biopsy 7/18 with 50% fibrocellular crescents and minimal IS fibrosis. Has rec'd her 2nd (of 4) Rituximab infusions yesterday w/o any side effects.  Creat stable in low 3's range. Getting  pred taper decrease by 10 every 2 wks.  Check Cr tomorrow 2.  Chronic afib - w/ RVR.  PO dilt being titrated up, stable today. HR 80's. Per cards/ primary.  3.  Nausea/ vomiting - unclear cause. Will get 2 view abdomen.  4.  HTN - bp's are normal, on po dilt only , home MTP dc'd 5.  Vol is euvolemic. Will dc her renal diet,  fluid restriction and daily bmet.  6.  IDDM  Rec - will follow   Kelly Splinter MD [facility] pager [phone]   03/12/2017, 9:50 AM    Recent Labs Lab 03/10/17 0420 03/11/17 0519 03/12/17 0428  NA 141 138 140  K 4.6 4.8 4.7  CL 110 106 109  CO2 23 25 25   GLUCOSE 151* 152* 171*  BUN 64* 69* 75*  CREATININE 2.95* 3.25* 3.05*  CALCIUM 8.6* 8.6* 8.3*    Recent Labs Lab 03/07/17 0537  AST 23  ALT 53  ALKPHOS 63  BILITOT 1.1  PROT 5.3*  ALBUMIN 2.9*    Recent Labs Lab 03/07/17 1908  03/10/17 0420 03/11/17 0519 03/12/17 0428  WBC 8.3  < > 10.6* 12.8* 12.2*  NEUTROABS 7.6  --   --   --   --  HGB 8.3*  < > 8.6* 8.6* 8.3*  HCT 25.2*  < > 25.5* 26.2* 24.8*  MCV 94.4  < > 92.1 94.6 95.0  PLT 220  < > 208 223 193  < > = values in this interval not displayed. Iron/TIBC/Ferritin/ %Sat    Component Value Date/Time   IRON 109 02/27/2017 1230   TIBC 255 02/27/2017 1230   FERRITIN 877 (H) 02/27/2017 1230   IRONPCTSAT 43 (H) 02/27/2017 1230

## 2017-03-12 NOTE — Progress Notes (Signed)
Patient refused all scheduled bedtime medications except saline flush for IV. Will continue to monitor patient.

## 2017-03-12 NOTE — Consult Note (Signed)
   [facility] CM Inpatient Consult   03/12/2017  Jody Ruiz October 21, 1940 [id number]    [facility] Care Management follow up.  Chart reviewed. Noted recommendations for SNF.   Spoke with inpatient RNCM to discuss. Will not attempt to re-engage for potential [facility] Care Management services at this time due to likely disposition being SNF.   Please see prior note from [name] on 03/10/17.  Marthenia Rolling, MSN-Ed, RN,BSN [facility] Liaison [phone]

## 2017-03-12 NOTE — Progress Notes (Signed)
[facility] for warfarin Indication: atrial fibrillation  Allergies  Allergen Reactions  . Brilinta [Ticagrelor] Shortness Of Breath, Other (See Comments) and Cough    Reaction:  Fatigue  . Lisinopril Shortness Of Breath and Cough  . Statins Other (See Comments)    Reaction:  Muscle pain/weakness  . Zetia [Ezetimibe] Other (See Comments)    Reaction:  Muscle pain/weakness   . Macrodantin [Nitrofurantoin Macrocrystal] Rash  . Sulfa Antibiotics Rash    Patient Measurements: Height: 5' 7.01" (170.2 cm) Weight: 179 lb 10.8 oz (81.5 kg) IBW/kg (Calculated) : 61.62   Vital Signs: Temp: 98.2 F (36.8 C) (09/06 0500) Temp Source: Oral (09/06 0500) BP: 129/52 (09/06 1121) Pulse Rate: 62 (09/06 1121)  Labs:  Recent Labs  03/10/17 0420 03/11/17 0519 03/12/17 0428  HGB 8.6* 8.6* 8.3*  HCT 25.5* 26.2* 24.8*  PLT 208 223 193  LABPROT 26.5* 28.9* 29.3*  INR 2.47 2.75 2.80  CREATININE 2.95* 3.25* 3.05*    Estimated Creatinine Clearance: 17.8 mL/min (A) (by C-G formula based on SCr of 3.05 mg/dL (H)).   Medications:  - Home warfarin: 1.25mg  MWF and 2.5mg  TTSS (last dose 8/28)  Assessment: 76 y.o F with hx afib on warfarin PTA presented the ED on 03/04/17 with AMS.  Warfarin resumed on admission. Note PMH CAD s/p recent stents, on ASA.  Today, 03/12/2017: - INR therapeutic at 2.8 (trending up) - Hgb low but stable, Plts wnl - No bleeding documented - Concurrent aspirin 81mg  noted - Cipro ordered which would be a significant drug interaction, however, patient currently refusing - Patient also on PO prednisone, which can affect INR  - Renal diet ordered, no intake documented today - Noted patient complaining of n/v this AM    Goal of Therapy:  INR 2-3 Monitor platelets by anticoagulation protocol: Yes   Plan:  - Repeat lower dose of warfarin 0.5 mg PO x1 today (to prevent further increase trend into supratherapeutic range) - monitor  for s/sx of bleeding - daily PT/INR   Lindell Spar, PharmD, BCPS Pager: [phone] 03/12/2017 1:28 PM

## 2017-03-12 NOTE — Progress Notes (Signed)
Physical Therapy Treatment Patient Details Name: Jody Ruiz MRN: [id number] DOB: 06/29/1941 Today's Date: 03/12/2017    History of Present Illness Jody Ruiz is a 76 y.o. female with a h/o recent clinical decline.  She has been diagnosed with rapidly progressive glomerulonephritis ANA vasculitis and has required steroids and rituximab which she has not well tolerated.  She also CAD s/p prior PCI and interstitial lung disease.  She is admitted with afib with RVR.  Of note, she has recently also had sinus bradycardia and pauses of 3.3 seconds with afib termination.    PT Comments    Pt required MAX encouragement "They won't let me rest".  "I'm so tired".  "I just want to rest". HR prior to activity 115.  Assisted to [facility] due to urge to have a BM.  HR increased as high as 170's   Instructed pt NOT to strain or push but to sit and allow gravity to assist.  HR range sitting BSC 134 to 175.  MAX c/o fatigue.  Pt sat on BSC 12 min and unable to have BM.  Too fatigued.  Assisted back to bed with increased assist and positioned on her side.  Daughter has concerns about taking care of pt at this level.  "she is not walking".  "She is not able". Spoke to daughter about SNF level and she agrees pt needs a higher level of care than what they can provide at home. Consulted LPT Santiago Glad to change D/C recommendation.  Follow Up Recommendations  SNF     Equipment Recommendations       Recommendations for Other Services       Precautions / Restrictions Precautions Precautions: Fall Precaution Comments: tacky Restrictions Weight Bearing Restrictions: No    Mobility  Bed Mobility Overal bed mobility: Needs Assistance Bed Mobility: Supine to Sit;Sit to Supine     Supine to sit: Min assist;Mod assist Sit to supine: Mod assist;Max assist   General bed mobility comments: required increased assist  Transfers Overall transfer level: Needs assistance Equipment used: None Transfers: Sit  to/from Stand;Stand Pivot Transfers Sit to Stand: Max assist Stand pivot transfers: Max assist       General transfer comment: required increased assist from elvated bed to [facility] then back to bed with 75% VC's on safety with turns and hand placement.    Ambulation/Gait         Gait velocity: deferred   General Gait Details: unable to attempt due to poor transfer ability and elevated HR    Stairs            Wheelchair Mobility    Modified Rankin (Stroke Patients Only)       Balance                                            Cognition Arousal/Alertness: Lethargic (sleepy/groggy/fatigue) Behavior During Therapy: Flat affect                                   General Comments: alert and oriented x 4      Exercises      General Comments        Pertinent Vitals/Pain Pain Assessment: No/denies pain    Home Living  Prior Function            PT Goals (current goals can now be found in the care plan section) Progress towards PT goals: Progressing toward goals    Frequency    Min 2X/week      PT Plan Discharge plan needs to be updated    Co-evaluation              AM-PAC PT "6 Clicks" Daily Activity  Outcome Measure  Difficulty turning over in bed (including adjusting bedclothes, sheets and blankets)?: Unable Difficulty moving from lying on back to sitting on the side of the bed? : Unable Difficulty sitting down on and standing up from a chair with arms (e.g., wheelchair, bedside commode, etc,.)?: Unable Help needed moving to and from a bed to chair (including a wheelchair)?: Total Help needed walking in hospital room?: Total Help needed climbing 3-5 steps with a railing? : Total 6 Click Score: 6    End of Session Equipment Utilized During Treatment: Gait belt Activity Tolerance: Treatment limited secondary to medical complications (Comment) Patient left: in bed;with call  bell/phone within reach;with family/visitor present   PT Visit Diagnosis: Adult, failure to thrive (R62.7);Muscle weakness (generalized) (M62.81)     Time: 0350-0938 PT Time Calculation (min) (ACUTE ONLY): 25 min  Charges:  $Therapeutic Activity: 23-37 mins                    G Codes:       Rica Koyanagi  PTA [facility]  Acute  Rehab Pager      [phone]

## 2017-03-12 NOTE — Care Management Note (Signed)
Case Management Note  Patient Details  Name: Jody Ruiz MRN: [id number] Date of Birth: [birth year]  Subjective/Objective:  PT recc SNF. CSW will follow for SNF.                  Action/Plan:d/c plan SNF.   Expected Discharge Date:   (unknown)               Expected Discharge Plan:  Skilled Nursing Facility  In-House Referral:  Clinical Social Work  Discharge planning Services  CM Consult  Post Acute Care Choice:    Choice offered to:  Patient  DME Arranged:    DME Agency:     HH Arranged:    [place] Agency:     Status of Service:  In process, will continue to follow  If discussed at Long Length of Stay Meetings, dates discussed:    Additional Comments:  Dessa Phi, RN 03/12/2017, 2:13 PM

## 2017-03-13 DIAGNOSIS — E118 Type 2 diabetes mellitus with unspecified complications: Secondary | ICD-10-CM | POA: Diagnosis not present

## 2017-03-13 DIAGNOSIS — F0631 Mood disorder due to known physiological condition with depressive features: Secondary | ICD-10-CM | POA: Diagnosis not present

## 2017-03-13 DIAGNOSIS — I482 Chronic atrial fibrillation: Secondary | ICD-10-CM | POA: Diagnosis not present

## 2017-03-13 DIAGNOSIS — M6281 Muscle weakness (generalized): Secondary | ICD-10-CM | POA: Diagnosis not present

## 2017-03-13 DIAGNOSIS — E785 Hyperlipidemia, unspecified: Secondary | ICD-10-CM | POA: Diagnosis not present

## 2017-03-13 DIAGNOSIS — F4323 Adjustment disorder with mixed anxiety and depressed mood: Secondary | ICD-10-CM | POA: Diagnosis not present

## 2017-03-13 DIAGNOSIS — J849 Interstitial pulmonary disease, unspecified: Secondary | ICD-10-CM | POA: Diagnosis not present

## 2017-03-13 DIAGNOSIS — Z955 Presence of coronary angioplasty implant and graft: Secondary | ICD-10-CM | POA: Diagnosis not present

## 2017-03-13 DIAGNOSIS — N017 Rapidly progressive nephritic syndrome with diffuse crescentic glomerulonephritis: Secondary | ICD-10-CM | POA: Diagnosis not present

## 2017-03-13 DIAGNOSIS — I495 Sick sinus syndrome: Secondary | ICD-10-CM

## 2017-03-13 DIAGNOSIS — Z9989 Dependence on other enabling machines and devices: Secondary | ICD-10-CM | POA: Diagnosis not present

## 2017-03-13 DIAGNOSIS — Z7901 Long term (current) use of anticoagulants: Secondary | ICD-10-CM | POA: Diagnosis not present

## 2017-03-13 DIAGNOSIS — D519 Vitamin B12 deficiency anemia, unspecified: Secondary | ICD-10-CM | POA: Diagnosis not present

## 2017-03-13 DIAGNOSIS — I5032 Chronic diastolic (congestive) heart failure: Secondary | ICD-10-CM | POA: Diagnosis not present

## 2017-03-13 DIAGNOSIS — I130 Hypertensive heart and chronic kidney disease with heart failure and stage 1 through stage 4 chronic kidney disease, or unspecified chronic kidney disease: Secondary | ICD-10-CM | POA: Diagnosis not present

## 2017-03-13 DIAGNOSIS — Z9181 History of falling: Secondary | ICD-10-CM | POA: Diagnosis not present

## 2017-03-13 DIAGNOSIS — N183 Chronic kidney disease, stage 3 unspecified: Secondary | ICD-10-CM | POA: Diagnosis present

## 2017-03-13 DIAGNOSIS — J84112 Idiopathic pulmonary fibrosis: Secondary | ICD-10-CM | POA: Diagnosis not present

## 2017-03-13 DIAGNOSIS — I480 Paroxysmal atrial fibrillation: Secondary | ICD-10-CM | POA: Diagnosis not present

## 2017-03-13 DIAGNOSIS — N170 Acute kidney failure with tubular necrosis: Secondary | ICD-10-CM | POA: Diagnosis not present

## 2017-03-13 DIAGNOSIS — I2510 Atherosclerotic heart disease of native coronary artery without angina pectoris: Secondary | ICD-10-CM | POA: Diagnosis not present

## 2017-03-13 DIAGNOSIS — F0630 Mood disorder due to known physiological condition, unspecified: Secondary | ICD-10-CM | POA: Diagnosis not present

## 2017-03-13 DIAGNOSIS — D631 Anemia in chronic kidney disease: Secondary | ICD-10-CM

## 2017-03-13 DIAGNOSIS — G4733 Obstructive sleep apnea (adult) (pediatric): Secondary | ICD-10-CM | POA: Diagnosis not present

## 2017-03-13 DIAGNOSIS — R2689 Other abnormalities of gait and mobility: Secondary | ICD-10-CM | POA: Diagnosis not present

## 2017-03-13 DIAGNOSIS — N289 Disorder of kidney and ureter, unspecified: Secondary | ICD-10-CM | POA: Diagnosis not present

## 2017-03-13 DIAGNOSIS — Z7982 Long term (current) use of aspirin: Secondary | ICD-10-CM | POA: Diagnosis not present

## 2017-03-13 DIAGNOSIS — D518 Other vitamin B12 deficiency anemias: Secondary | ICD-10-CM

## 2017-03-13 DIAGNOSIS — K5900 Constipation, unspecified: Secondary | ICD-10-CM | POA: Diagnosis not present

## 2017-03-13 DIAGNOSIS — I5040 Unspecified combined systolic (congestive) and diastolic (congestive) heart failure: Secondary | ICD-10-CM | POA: Diagnosis not present

## 2017-03-13 DIAGNOSIS — E43 Unspecified severe protein-calorie malnutrition: Secondary | ICD-10-CM | POA: Diagnosis not present

## 2017-03-13 DIAGNOSIS — R262 Difficulty in walking, not elsewhere classified: Secondary | ICD-10-CM | POA: Diagnosis not present

## 2017-03-13 DIAGNOSIS — I4891 Unspecified atrial fibrillation: Secondary | ICD-10-CM | POA: Diagnosis not present

## 2017-03-13 DIAGNOSIS — E1122 Type 2 diabetes mellitus with diabetic chronic kidney disease: Secondary | ICD-10-CM | POA: Diagnosis not present

## 2017-03-13 DIAGNOSIS — N179 Acute kidney failure, unspecified: Secondary | ICD-10-CM | POA: Diagnosis present

## 2017-03-13 LAB — BASIC METABOLIC PANEL
ANION GAP: 7 (ref 5–15)
BUN: 74 mg/dL — ABNORMAL HIGH (ref 6–20)
CALCIUM: 8.7 mg/dL — AB (ref 8.9–10.3)
CO2: 26 mmol/L (ref 22–32)
Chloride: 108 mmol/L (ref 101–111)
Creatinine, Ser: 3.07 mg/dL — ABNORMAL HIGH (ref 0.44–1.00)
GFR calc Af Amer: 16 mL/min — ABNORMAL LOW (ref >60)
GFR calc non Af Amer: 14 mL/min — ABNORMAL LOW (ref >60)
GLUCOSE: 159 mg/dL — AB (ref 65–99)
Potassium: 4.9 mmol/L (ref 3.5–5.1)
Sodium: 141 mmol/L (ref 135–145)

## 2017-03-13 LAB — PROTIME-INR
INR: 2.34
PROTHROMBIN TIME: 25.4 s — AB (ref 11.4–15.2)

## 2017-03-13 LAB — GLUCOSE, CAPILLARY
GLUCOSE-CAPILLARY: 123 mg/dL — AB (ref 65–99)
Glucose-Capillary: 151 mg/dL — ABNORMAL HIGH (ref 65–99)

## 2017-03-13 MED ORDER — DILTIAZEM HCL ER COATED BEADS 360 MG PO CP24: 360.0000 mg | ORAL_CAPSULE | Freq: Every day | ORAL | 0 refills | Status: AC

## 2017-03-13 MED ORDER — PREDNISONE 10 MG PO TABS: 5.0000 mg | ORAL_TABLET | Freq: Every day | ORAL | 0 refills | Status: AC

## 2017-03-13 MED ORDER — POLYETHYLENE GLYCOL 3350 17 G PO PACK: 17.0000 g | PACK | Freq: Every day | ORAL | 0 refills | Status: AC | PRN

## 2017-03-13 MED ORDER — WARFARIN SODIUM 2.5 MG PO TABS: 1.2500 mg | ORAL_TABLET | Freq: Once | ORAL | Status: DC

## 2017-03-13 MED ORDER — INSULIN ASPART 100 UNIT/ML ~~LOC~~ SOLN: 0.0000 [IU] | Freq: Every day | SUBCUTANEOUS | 11 refills | Status: AC

## 2017-03-13 MED ORDER — CYANOCOBALAMIN 500 MCG PO TABS: 500.0000 ug | ORAL_TABLET | Freq: Every day | ORAL | 0 refills | Status: DC

## 2017-03-13 NOTE — Progress Notes (Signed)
[place] for warfarin Indication: atrial fibrillation  Allergies  Allergen Reactions  . Brilinta [Ticagrelor] Shortness Of Breath, Other (See Comments) and Cough    Reaction:  Fatigue  . Lisinopril Shortness Of Breath and Cough  . Statins Other (See Comments)    Reaction:  Muscle pain/weakness  . Zetia [Ezetimibe] Other (See Comments)    Reaction:  Muscle pain/weakness   . Macrodantin [Nitrofurantoin Macrocrystal] Rash  . Sulfa Antibiotics Rash    Patient Measurements: Height: 5' 7.01" (170.2 cm) Weight: 179 lb 10.8 oz (81.5 kg) IBW/kg (Calculated) : 61.62   Vital Signs: Temp: 98.2 F (36.8 C) (09/07 0452) Temp Source: Oral (09/07 0452) BP: 143/85 (09/07 0452) Pulse Rate: 98 (09/07 0452)  Labs:  Recent Labs  03/11/17 0519 03/12/17 0428 03/13/17 0444  HGB 8.6* 8.3*  --   HCT 26.2* 24.8*  --   PLT 223 193  --   LABPROT 28.9* 29.3* 25.4*  INR 2.75 2.80 2.34  CREATININE 3.25* 3.05* 3.07*    Estimated Creatinine Clearance: 17.7 mL/min (A) (by C-G formula based on SCr of 3.07 mg/dL (H)).   Medications:  - Home warfarin: 1.25mg  MWF and 2.5mg  TTSS (last dose 8/28)  Assessment: 76 y.o F with hx afib on warfarin PTA presented the ED on 03/04/17 with AMS.  Warfarin resumed on admission. Note PMH CAD s/p recent stents, on ASA.  Today, 03/13/2017: - INR therapeutic at 2.34 (stabilizing) - CBC on 9/6: Hgb low but stable, Plts wnl - No bleeding documented - Concurrent aspirin 81mg  noted - Cipro ordered which would be a significant drug interaction, however, patient currently refusing - Patient also on PO prednisone, which can affect INR  - Renal diet ordered, no intake documented today - Noted patient with recent c/o N/V   Goal of Therapy:  INR 2-3 Monitor platelets by anticoagulation protocol: Yes   Plan:  - Warfarin 1.25mg  PO today per home regimen - If discharged today, appears stable to resume home regimen as above -  monitor for s/sx of bleeding - daily PT/INR   Peggyann Juba, PharmD, BCPS Pager: [phone] 03/12/2017 1:28 PM

## 2017-03-13 NOTE — Progress Notes (Signed)
[facility] ROUNDING NOTE   Subjective:   Interval History: Very weak and no new complaints  Less nausea with zofran and bowel movement with enema 76 year old female with chronic A. 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 In the ED she was found to be in rapid A. fib and admitted to telemetry.  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      Objective:  Vital signs in last 24 hours:  Temp:  [97.6 F (36.4 C)-98.7 F (37.1 C)] 98.2 F (36.8 C) (09/07 0452) Pulse Rate:  [62-105] 98 (09/07 0452) Resp:  [16] 16 (09/07 0452) BP: (129-143)/(52-85) 143/85 (09/07 0452) SpO2:  [95 %-99 %] 95 % (09/07 0452)  Weight change:  Filed Weights   03/05/17 0842 03/05/17 1423 03/07/17 2015  Weight: 185 lb (83.9 kg) 179 lb 3.7 oz (81.3 kg) 179 lb 10.8 oz (81.5 kg)    Intake/Output: I/O last 3 completed shifts: In: 120 [P.O.:120] Out: 1700 [Urine:1700]   Intake/Output this shift:  No intake/output data recorded.  Gen alert, no distress,appears tired No rash, cyanosis or gangrene Sclera anicteric, throat clear  No jvd or bruits Chest clear bilat RRR no MRG Abd soft ntnd no mass or ascites +bs Ext no LEedema Neuro is alert, Ox 3 , nf   Basic Metabolic Panel:  Recent Labs Lab 03/07/17 1908 03/08/17 1504 03/09/17 0439 03/10/17 0420 03/11/17 0519 03/12/17 0428 03/13/17 0444  NA 139 140 142 141 138 140 141  K 5.0 4.7 4.6 4.6 4.8 4.7 4.9  CL 108 108 110 110 106 109 108  CO2 24 22 24 23 25 25 26   GLUCOSE 269* 251* 146* 151* 152* 171* 159*  BUN 66* 65* 67* 64* 69* 75* 74*  CREATININE 3.26* 3.35* 3.22* 2.95* 3.25* 3.05* 3.07*  CALCIUM 8.3* 8.4* 8.5* 8.6* 8.6* 8.3* 8.7*  MG 2.0 2.0  --   --   --   --   --     Liver Function Tests:  Recent Labs Lab 03/07/17 0537  AST 23  ALT 53  ALKPHOS 63  BILITOT 1.1  PROT 5.3*  ALBUMIN 2.9*   No  results for input(s): LIPASE, AMYLASE in the last 168 hours. No results for input(s): AMMONIA in the last 168 hours.  CBC:  Recent Labs Lab 03/07/17 1908 03/08/17 1504 03/09/17 0439 03/10/17 0420 03/11/17 0519 03/12/17 0428  WBC 8.3 9.8 8.9 10.6* 12.8* 12.2*  NEUTROABS 7.6  --   --   --   --   --   HGB 8.3* 8.7* 8.5* 8.6* 8.6* 8.3*  HCT 25.2* 26.6* 25.7* 25.5* 26.2* 24.8*  MCV 94.4 95.0 92.1 92.1 94.6 95.0  PLT 220 219 194 208 223 193    Cardiac Enzymes: No results for input(s): CKTOTAL, CKMB, CKMBINDEX, TROPONINI in the last 168 hours.  BNP: Invalid input(s): POCBNP  CBG:  Recent Labs Lab 03/12/17 0736 03/12/17 1140 03/12/17 1748 03/12/17 2136 03/13/17 0749  GLUCAP 122* 176* 216* 238* 123*    Microbiology: Results for orders placed or performed during the hospital encounter of 03/04/17  Culture, Urine     Status: Abnormal   Collection Time: 03/05/17 11:51 PM  Result Value Ref Range Status   Specimen Description URINE, CLEAN CATCH  Final   Special Requests NONE  Final   Culture (A)  Final    20,000 COLONIES/mL CITROBACTER FREUNDII 20,000 COLONIES/mL KLEBSIELLA OXYTOCA  Report Status 03/09/2017 FINAL  Final   Organism ID, Bacteria CITROBACTER FREUNDII (A)  Final   Organism ID, Bacteria KLEBSIELLA OXYTOCA (A)  Final      Susceptibility   Citrobacter freundii - MIC*    CEFAZOLIN >=64 RESISTANT Resistant     CEFTRIAXONE <=1 SENSITIVE Sensitive     CIPROFLOXACIN <=0.25 SENSITIVE Sensitive     GENTAMICIN <=1 SENSITIVE Sensitive     IMIPENEM <=0.25 SENSITIVE Sensitive     NITROFURANTOIN <=16 SENSITIVE Sensitive     TRIMETH/SULFA <=20 SENSITIVE Sensitive     PIP/TAZO <=4 SENSITIVE Sensitive     * 20,000 COLONIES/mL CITROBACTER FREUNDII   Klebsiella oxytoca - MIC*    AMPICILLIN >=32 RESISTANT Resistant     CEFAZOLIN 8 SENSITIVE Sensitive     CEFTRIAXONE <=1 SENSITIVE Sensitive     CIPROFLOXACIN <=0.25 SENSITIVE Sensitive     GENTAMICIN <=1 SENSITIVE  Sensitive     IMIPENEM <=0.25 SENSITIVE Sensitive     NITROFURANTOIN <=16 SENSITIVE Sensitive     TRIMETH/SULFA <=20 SENSITIVE Sensitive     AMPICILLIN/SULBACTAM 16 INTERMEDIATE Intermediate     PIP/TAZO <=4 SENSITIVE Sensitive     Extended ESBL NEGATIVE Sensitive     * 20,000 COLONIES/mL KLEBSIELLA OXYTOCA  MRSA PCR Screening     Status: None   Collection Time: 03/07/17  8:46 PM  Result Value Ref Range Status   MRSA by PCR NEGATIVE NEGATIVE Final    Comment:        The GeneXpert MRSA Assay (FDA approved for NASAL specimens only), is one component of a comprehensive MRSA colonization surveillance program. It is not intended to diagnose MRSA infection nor to guide or monitor treatment for MRSA infections.     Coagulation Studies:  Recent Labs  03/11/17 0519 03/12/17 0428 03/13/17 0444  LABPROT 28.9* 29.3* 25.4*  INR 2.75 2.80 2.34    Urinalysis: No results for input(s): COLORURINE, LABSPEC, PHURINE, GLUCOSEU, HGBUR, BILIRUBINUR, KETONESUR, PROTEINUR, UROBILINOGEN, NITRITE, LEUKOCYTESUR in the last 72 hours.  Invalid input(s): APPERANCEUR    Imaging: Dg Abd 2 Views  Result Date: 03/12/2017 CLINICAL DATA:  Abdominal pain, unable to have a bowel movement EXAM: ABDOMEN - 2 VIEW COMPARISON:  01/13/2017 FINDINGS: Evidence of prior hernia repair. Nonobstructed gas pattern. No definite free air. Moderate stool in the colon. Mild to moderate feces retention in the rectum. Degenerative changes of the spine. IMPRESSION: 1. Nonobstructed gas pattern with moderate stool in the colon. Mild to moderate retained feces in the rectum. Electronically Signed   By: Donavan Foil M.D.   On: 03/12/2017 17:07     Medications:   . sodium chloride Stopped (03/06/17 1900)   . aspirin EC  81 mg Oral Daily  . [START ON 03/17/2017] cyanocobalamin  1,000 mcg Subcutaneous Weekly  . diltiazem  360 mg Oral Daily  . famotidine  20 mg Oral Daily  . insulin aspart  0-5 Units Subcutaneous QHS  .  insulin aspart  0-9 Units Subcutaneous TID WC  . polyethylene glycol  17 g Oral Daily  . [START ON 04/10/2017] predniSONE  10 mg Oral Q breakfast  . [START ON 03/27/2017] predniSONE  20 mg Oral Q breakfast  . predniSONE  30 mg Oral Q breakfast  . [START ON 04/24/2017] predniSONE  5 mg Oral Q breakfast  . [START ON 05/08/2017] predniSONE  5 mg Oral Q48H  . saccharomyces boulardii  250 mg Oral BID  . sodium chloride flush  3 mL Intravenous Q12H  . warfarin  1.25 mg Oral ONCE-1800  . Warfarin - Pharmacist Dosing Inpatient   Does not apply q1800   sodium chloride, acetaminophen **OR** acetaminophen, nitroGLYCERIN, [DISCONTINUED] ondansetron **OR** ondansetron (ZOFRAN) IV, ondansetron, polyvinyl alcohol, sodium chloride flush, traMADol  Assessment/ Plan:  1. 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 Has rec'd her 2nd (of 4) Rituximab infusions yesterday w/o any side effects.  Creat stable in low 3's range. Getting pred taper decrease by 10 every 2 wks.   She seems very week and low GFR  It does not appear to be low enough to explain her weakness and would be surprised if she is uremic. Her daughter Anderson Malta is in the room and has been ill for 9 months.  2.  Chronic afib - w/ RVR.  PO dilt being titrated up, stable today. HR 80's. Per cards/ primary.  3.  Nausea/ vomiting - Abdominal film appears to have some colonic stool   Bowels moved after enema  4. HTN -  Controlled  5. Vol is euvolemic. Will dc her renal diet,  fluid restriction and daily bmet.  6. IDDM  Rec - will follow although renal function is stable    LOS: 9 Eulas Schweitzer W @TODAY @9 :57 AM

## 2017-03-13 NOTE — Clinical Social Work Placement (Signed)
   CLINICAL SOCIAL WORK PLACEMENT  NOTE  Date:  03/13/2017  Patient Details  Name: Jody Ruiz MRN: [id number] Date of Birth: Nov 12, 1940  Clinical Social Work is seeking post-discharge placement for this patient at the [place] level of care (*CSW will initial, date and re-position this form in  chart as items are completed):  Yes   Patient/family provided with [place] Work Department's list of facilities offering this level of care within the geographic area requested by the patient (or if unable, by the patient's family).  Yes   Patient/family informed of their freedom to choose among providers that offer the needed level of care, that participate in Medicare, Medicaid or managed care program needed by the patient, have an available bed and are willing to accept the patient.  Yes   Patient/family informed of [facility]'s ownership interest in [facility] and [facility], as well as of the fact that they are under no obligation to receive care at these facilities.  PASRR submitted to EDS on 03/13/17     PASRR number received on 03/13/17     Existing PASRR number confirmed on       FL2 transmitted to all facilities in geographic area requested by pt/family on 03/13/17     FL2 transmitted to all facilities within larger geographic area on       Patient informed that his/her managed care company has contracts with or will negotiate with certain facilities, including the following:        Yes   Patient/family informed of bed offers received.  Patient chooses bed at [facility]     Physician recommends and patient chooses bed at      Patient to be transferred to [facility] on 03/13/17.  Patient to be transferred to facility by [facility]     Patient family notified on 03/13/17 of transfer.  Name of family member notified:  Daughter     PHYSICIAN       Additional Comment: Pt / family are in agreement with dc to  [facility] today. [facility] transport is required. Medical necessity form completed. Scripts included in [employer]. # for report provided to nsg.   _______________________________________________ Luretha Rued, [place] 03/13/2017, 12:19 PM

## 2017-03-13 NOTE — Discharge Summary (Signed)
Physician Discharge Summary  Jody Ruiz:[id number] DOB: 02/07/1941 DOA: 03/04/2017  PCP: Burnard Bunting, MD  Admit date: 03/04/2017 Discharge date: 03/13/2017  Admitted From:Home Disposition:  Skilled nursing facility [place])  Recommendations for Outpatient Follow-up:  1. Follow up with M.D. at SNF in 1 week. 2. Please check renal function in 3-4 days. Patient needs to follow with her cardiologist and nephrologist in the next 4 weeks.   Equipment/Devices: As per therapy at the facility  Discharge Condition: Fair CODE STATUS: Full code Diet recommendation: Heart healthy/diabetic   Discharge Diagnoses:  Principal Problem:   Atrial fibrillation with RVR ([place])   Active Problems:   Acute renal failure superimposed on stage 3 chronic kidney disease (HCC)   Hypertension   IPF (idiopathic pulmonary fibrosis) (HCC)   Tachycardia-bradycardia syndrome (HCC)   Gastroesophageal reflux disease   Glomerulonephritis   Chronic diastolic CHF (congestive heart failure) (HCC)   Diabetes mellitus with complication (HCC)   Protein-calorie malnutrition, severe   Depression due to physical illness   Adjustment disorder with mixed anxiety and depressed mood   Anemia of chronic kidney failure, stage 3 (moderate)   B12 deficiency anemia  Brief narrative/history of present illness Please refer to admission H&P for details, brief,76 year old female with chronic A. 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 In the ED she was found to be in rapid A. fib and admitted to telemetry. Hospital course prolonged due to acute on chronic kidney disease and persistent A. Fib.   Hospital course   Principal Problem:   Atrial fibrillation with RVR (HCC) Patient switched to oral Cardizem and was increased up to 360 mg daily. Heart rate has mostly been stable except for occasional elevation to 120s-140s (mainly on  exertion). Refused digoxin. -Cardiology recommend not to increase the rate limiting agents further as patient has had bradycardia in the past and that patient has been asymptomatic on occasional elevated heart rate.. Continue Coumadin. INR therapeutic.  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 Given her poor clinical condition not a candidate for ablation, antiarrhythmics or pacing.Marland Kitchen She will follow up with Dr. Lovena Le as outpatient. Keep k>4 and mg>2. TSH normal. Troponin mildly elevated due to demand ischemia.   Active Problems: Severe depression. Seen by psychiatry who recommended starting Remeron for insomnia, mood and anxiety but patient refused. Per psychiatry patient HAS capacity for making complex medical decisions.  Acute on Chronic kidney disease stage III with ANCA vasculitis and RPGN. Worsened renal function which is slowly improving and close to baseline.. Treated for 5 day course of empiric ciprofloxacin with concern for UTI  Continue Rituxan. Renal consult appreciated.  Fall with generalized weakness Suspect due to deconditioning and? Orthostasis. PT recommends SNF.  Idiopathic pulmonary fibrosis On chronic prednisone (being tapered every 2 weeks.)  Essential hypertension Stable. Now on Cardizem. Discontinued metoprolol and amlodipine. Blood pressure stable.  Vitamin B12 deficiency Continue daily supplement. Still has low levels.  Chronic diastolic CHF. Stable on Cardizem. Home dose metoprolol discontinued.   Diabetes mellitus with complication (HCC) Stable on sliding scale coverage. A1c of 5.8.    Protein-calorie malnutrition, severe Added supplement.  Nausea and vomiting. Possibly due to constipation. Abdominal x-ray negative for obstruction and shows stool burden. Improved with enema. Symptoms resolved. Continue MiraLAX when necessary.  Goals of care discussion  discussed by my partner Dr. Posey Pronto earlier this week. Patient wanted  to remain full code and continue full scope of treatment. She  refused SNF and wanted to go home. I discussed goals of care again and possibility of involving palliative care. Patient told me that she has already discussed goals of care with Dr. Posey Pronto early this week.     Family Communication  : Daughter at bedside  Disposition Plan  :  skilled nursing facility   Consults  :  Cardiology Nephrology  Procedures  : None  Discharge Instructions   Allergies as of 03/13/2017      Reactions   Brilinta [ticagrelor] Shortness Of Breath, Other (See Comments), Cough   Reaction:  Fatigue   Lisinopril Shortness Of Breath, Cough   Statins Other (See Comments)   Reaction:  Muscle pain/weakness   Zetia [ezetimibe] Other (See Comments)   Reaction:  Muscle pain/weakness    Macrodantin [nitrofurantoin Macrocrystal] Rash   Sulfa Antibiotics Rash      Medication List    STOP taking these medications   amLODipine 5 MG tablet Commonly known as:  NORVASC   metoprolol tartrate 25 MG tablet Commonly known as:  LOPRESSOR   saccharomyces boulardii 250 MG capsule Commonly known as:  FLORASTOR     TAKE these medications   aspirin EC 81 MG tablet Take 81 mg by mouth daily before breakfast.   dicyclomine 10 MG capsule Commonly known as:  BENTYL Take 1 capsule (10 mg total) by mouth 3 (three) times daily as needed for spasms.   diltiazem 360 MG 24 hr capsule Commonly known as:  CARDIZEM CD Take 1 capsule (360 mg total) by mouth daily.   famotidine 20 MG tablet Commonly known as:  PEPCID Take 1 tablet (20 mg total) by mouth 2 (two) times daily.   insulin aspart 100 UNIT/ML injection Commonly known as:  novoLOG Inject 0-5 Units into the skin at bedtime.   isosorbide mononitrate 60 MG 24 hr tablet Commonly known as:  IMDUR Take 60 mg by mouth at bedtime.   nitroGLYCERIN 0.4 MG SL tablet Commonly known as:  NITROSTAT Place 1 tablet (0.4 mg total) under the tongue every 5 (five)  minutes x 3 doses as needed for chest pain.   ondansetron 4 MG tablet Commonly known as:  ZOFRAN Take 1 tablet (4 mg total) by mouth every 8 (eight) hours as needed for nausea or vomiting.   polyethylene glycol packet Commonly known as:  MIRALAX / GLYCOLAX Take 17 g by mouth daily as needed for mild constipation.   polyvinyl alcohol 1.4 % ophthalmic solution Commonly known as:  LIQUIFILM TEARS Place 1 drop into both eyes 3 (three) times daily as needed for dry eyes.   predniSONE 10 MG tablet Commonly known as:  DELTASONE Take 0.5-5 tablets (5-50 mg total) by mouth daily. 30mg  for 2 weeks, 20mg  for 2 weeks, 10mg  for 2 weeks, 5 mg for 2 weeks, then 5mg  everyother day for 2 weeks. Then stop. Start date was 02/17/17. What changed:  additional instructions   protein supplement Powd Commonly known as:  UNJURY CHICKEN SOUP Take 7 g (2 oz total) by mouth 2 (two) times daily with a meal.   RITUXAN IV Inject into the vein. [employer]. Next Dose 03/10/17. Dr. Hollie Salk Prescibing Dr. Maryjane Hurter dose 02/28/17   vitamin B-12 1000 MCG tablet Commonly known as:  CYANOCOBALAMIN Take 1 tablet (1,000 mcg total) by mouth daily. What changed:  when to take this   warfarin 2.5 MG tablet Commonly known as:  COUMADIN Take 0.5-1 tablets (1.25-2.5 mg total) by mouth See admin instructions. Take 1.25mg  (1/2  tablet) by mouth on Monday, Wednesday, and Friday.  Take 1 tablet (2.5mg ) by mouth on Tuesday, Thursday, Saturday, and sunday What changed:  additional instructions            Discharge Care Instructions        Start     Ordered   03/14/17 0000  diltiazem (CARDIZEM CD) 360 MG 24 hr capsule  Daily     03/13/17 1220   03/13/17 0000  predniSONE (DELTASONE) 10 MG tablet  Daily     03/13/17 1220   03/13/17 0000  polyethylene glycol (MIRALAX / GLYCOLAX) packet  Daily PRN     03/13/17 1225   03/13/17 0000  insulin aspart (NOVOLOG) 100 UNIT/ML injection  Daily at bedtime     09 /07/18 1229      Follow-up Information    MD at SNF in 1 week Follow up.        Evans Lance, MD. Schedule an appointment as soon as possible for a visit in 4 week(s).   Specialty:  Cardiology Contact information: [address] [address] [phone]        Madelon Lips, MD Follow up in 3 week(s).   Specialty:  Nephrology Contact information: [address]  [place] [address] [phone]          Allergies  Allergen Reactions  . Brilinta [Ticagrelor] Shortness Of Breath, Other (See Comments) and Cough    Reaction:  Fatigue  . Lisinopril Shortness Of Breath and Cough  . Statins Other (See Comments)    Reaction:  Muscle pain/weakness  . Zetia [Ezetimibe] Other (See Comments)    Reaction:  Muscle pain/weakness   . Macrodantin [Nitrofurantoin Macrocrystal] Rash  . Sulfa Antibiotics Rash      Procedures/Studies: Dg Chest 2 View  Result Date: 03/04/2017 CLINICAL DATA:  Initial evaluation for acute weakness. EXAM: CHEST  2 VIEW COMPARISON:  Prior radiograph from 02/26/2017. FINDINGS: Cardiac and mediastinal silhouettes are stable in size and contour, and remain within normal limits. Elevation of the right hemidiaphragm, stable from previous. No focal infiltrate, pulmonary edema, or pleural effusion. No pneumothorax. No acute osseus abnormality. IMPRESSION: 1. No active cardiopulmonary disease. 2. Elevation of the right hemidiaphragm, similar to prior, and likely chronic. Electronically Signed   By: Jeannine Boga M.D.   On: 03/04/2017 13:46   Dg Chest 2 View  Result Date: 02/26/2017 CLINICAL DATA:  Chronic renal failure. The patient has been lethargic. EXAM: CHEST  2 VIEW COMPARISON:  January 16, 2017 FINDINGS: Elevation of the right hemidiaphragm persists. The heart, hila, mediastinum, lungs, and pleura are otherwise normal. No acute interval changes. IMPRESSION: No active cardiopulmonary disease. Electronically Signed   By: Dorise Bullion III M.D    On: 02/26/2017 12:39   Dg Abd 2 Views  Result Date: 03/12/2017 CLINICAL DATA:  Abdominal pain, unable to have a bowel movement EXAM: ABDOMEN - 2 VIEW COMPARISON:  01/13/2017 FINDINGS: Evidence of prior hernia repair. Nonobstructed gas pattern. No definite free air. Moderate stool in the colon. Mild to moderate feces retention in the rectum. Degenerative changes of the spine. IMPRESSION: 1. Nonobstructed gas pattern with moderate stool in the colon. Mild to moderate retained feces in the rectum. Electronically Signed   By: Donavan Foil M.D.   On: 03/12/2017 17:07       Subjective: Nausea and vomiting resolved after bowel movement following enema yesterday. Has flat affect and is not willing to participate in detailed conversation. Denies any shortness  of breath, palpitations or chest discomfort.  Discharge Exam: Vitals:   03/13/17 0452 03/13/17 1024  BP: (!) 143/85 (!) 128/46  Pulse: 98   Resp: 16   Temp: 98.2 F (36.8 C)   SpO2: 95%    Vitals:   03/12/17 1337 03/12/17 2141 03/13/17 0452 03/13/17 1024  BP: 129/66 134/78 (!) 143/85 (!) 128/46  Pulse: (!) 105 93 98   Resp:  16 16   Temp: 97.6 F (36.4 C) 98.7 F (37.1 C) 98.2 F (36.8 C)   TempSrc: Oral Oral Oral   SpO2: 99% 96% 95%   Weight:      Height:         General: Appears fatigued, has flat Affect HEENT: Moist mucosa, supple neck Chest: Clear bilaterally, no added sounds CVS: S1 and S2 irregular, no murmurs rubs or gallop GI: Soft, nondistended, nontender, bowel sounds present Musculoskeletal: Warm, no edema   The results of significant diagnostics from this hospitalization (including imaging, microbiology, ancillary and laboratory) are listed below for reference.     Microbiology: Recent Results (from the past 240 hour(s))  Culture, Urine     Status: Abnormal   Collection Time: 03/05/17 11:51 PM  Result Value Ref Range Status   Specimen Description URINE, CLEAN CATCH  Final   Special Requests NONE   Final   Culture (A)  Final    20,000 COLONIES/mL CITROBACTER FREUNDII 20,000 COLONIES/mL KLEBSIELLA OXYTOCA    Report Status 03/09/2017 FINAL  Final   Organism ID, Bacteria CITROBACTER FREUNDII (A)  Final   Organism ID, Bacteria KLEBSIELLA OXYTOCA (A)  Final      Susceptibility   Citrobacter freundii - MIC*    CEFAZOLIN >=64 RESISTANT Resistant     CEFTRIAXONE <=1 SENSITIVE Sensitive     CIPROFLOXACIN <=0.25 SENSITIVE Sensitive     GENTAMICIN <=1 SENSITIVE Sensitive     IMIPENEM <=0.25 SENSITIVE Sensitive     NITROFURANTOIN <=16 SENSITIVE Sensitive     TRIMETH/SULFA <=20 SENSITIVE Sensitive     PIP/TAZO <=4 SENSITIVE Sensitive     * 20,000 COLONIES/mL CITROBACTER FREUNDII   Klebsiella oxytoca - MIC*    AMPICILLIN >=32 RESISTANT Resistant     CEFAZOLIN 8 SENSITIVE Sensitive     CEFTRIAXONE <=1 SENSITIVE Sensitive     CIPROFLOXACIN <=0.25 SENSITIVE Sensitive     GENTAMICIN <=1 SENSITIVE Sensitive     IMIPENEM <=0.25 SENSITIVE Sensitive     NITROFURANTOIN <=16 SENSITIVE Sensitive     TRIMETH/SULFA <=20 SENSITIVE Sensitive     AMPICILLIN/SULBACTAM 16 INTERMEDIATE Intermediate     PIP/TAZO <=4 SENSITIVE Sensitive     Extended ESBL NEGATIVE Sensitive     * 20,000 COLONIES/mL KLEBSIELLA OXYTOCA  MRSA PCR Screening     Status: None   Collection Time: 03/07/17  8:46 PM  Result Value Ref Range Status   MRSA by PCR NEGATIVE NEGATIVE Final    Comment:        The GeneXpert MRSA Assay (FDA approved for NASAL specimens only), is one component of a comprehensive MRSA colonization surveillance program. It is not intended to diagnose MRSA infection nor to guide or monitor treatment for MRSA infections.      Labs: BNP (last 3 results)  Recent Labs  11/05/16 0755  BNP 36.6   Basic Metabolic Panel:  Recent Labs Lab 03/07/17 1908 03/08/17 1504 03/09/17 0439 03/10/17 0420 03/11/17 0519 03/12/17 0428 03/13/17 0444  NA 139 140 142 141 138 140 141  K 5.0 4.7 4.6 4.6 4.8 4.7  4.9  CL 108 108 110 110 106 109 108  CO2 24 22 24 23 25 25 26   GLUCOSE 269* 251* 146* 151* 152* 171* 159*  BUN 66* 65* 67* 64* 69* 75* 74*  CREATININE 3.26* 3.35* 3.22* 2.95* 3.25* 3.05* 3.07*  CALCIUM 8.3* 8.4* 8.5* 8.6* 8.6* 8.3* 8.7*  MG 2.0 2.0  --   --   --   --   --    Liver Function Tests:  Recent Labs Lab 03/07/17 0537  AST 23  ALT 53  ALKPHOS 63  BILITOT 1.1  PROT 5.3*  ALBUMIN 2.9*   No results for input(s): LIPASE, AMYLASE in the last 168 hours. No results for input(s): AMMONIA in the last 168 hours. CBC:  Recent Labs Lab 03/07/17 1908 03/08/17 1504 03/09/17 0439 03/10/17 0420 03/11/17 0519 03/12/17 0428  WBC 8.3 9.8 8.9 10.6* 12.8* 12.2*  NEUTROABS 7.6  --   --   --   --   --   HGB 8.3* 8.7* 8.5* 8.6* 8.6* 8.3*  HCT 25.2* 26.6* 25.7* 25.5* 26.2* 24.8*  MCV 94.4 95.0 92.1 92.1 94.6 95.0  PLT 220 219 194 208 223 193   Cardiac Enzymes: No results for input(s): CKTOTAL, CKMB, CKMBINDEX, TROPONINI in the last 168 hours. BNP: Invalid input(s): POCBNP CBG:  Recent Labs Lab 03/12/17 1140 03/12/17 1748 03/12/17 2136 03/13/17 0749 03/13/17 1200  GLUCAP 176* 216* 238* 123* 151*   D-Dimer No results for input(s): DDIMER in the last 72 hours. Hgb A1c No results for input(s): HGBA1C in the last 72 hours. Lipid Profile No results for input(s): CHOL, HDL, LDLCALC, TRIG, CHOLHDL, LDLDIRECT in the last 72 hours. Thyroid function studies No results for input(s): TSH, T4TOTAL, T3FREE, THYROIDAB in the last 72 hours.  Invalid input(s): FREET3 Anemia work up No results for input(s): VITAMINB12, FOLATE, FERRITIN, TIBC, IRON, RETICCTPCT in the last 72 hours. Urinalysis    Component Value Date/Time   COLORURINE YELLOW 03/05/2017 2351   APPEARANCEUR CLEAR 03/05/2017 2351   LABSPEC 1.010 03/05/2017 2351   PHURINE 5.0 03/05/2017 2351   GLUCOSEU 50 (A) 03/05/2017 2351   HGBUR LARGE (A) 03/05/2017 2351   BILIRUBINUR NEGATIVE 03/05/2017 2351   KETONESUR  NEGATIVE 03/05/2017 2351   PROTEINUR 100 (A) 03/05/2017 2351   NITRITE NEGATIVE 03/05/2017 2351   LEUKOCYTESUR MODERATE (A) 03/05/2017 2351   Sepsis Labs Invalid input(s): PROCALCITONIN,  WBC,  LACTICIDVEN Microbiology Recent Results (from the past 240 hour(s))  Culture, Urine     Status: Abnormal   Collection Time: 03/05/17 11:51 PM  Result Value Ref Range Status   Specimen Description URINE, CLEAN CATCH  Final   Special Requests NONE  Final   Culture (A)  Final    20,000 COLONIES/mL CITROBACTER FREUNDII 20,000 COLONIES/mL KLEBSIELLA OXYTOCA    Report Status 03/09/2017 FINAL  Final   Organism ID, Bacteria CITROBACTER FREUNDII (A)  Final   Organism ID, Bacteria KLEBSIELLA OXYTOCA (A)  Final      Susceptibility   Citrobacter freundii - MIC*    CEFAZOLIN >=64 RESISTANT Resistant     CEFTRIAXONE <=1 SENSITIVE Sensitive     CIPROFLOXACIN <=0.25 SENSITIVE Sensitive     GENTAMICIN <=1 SENSITIVE Sensitive     IMIPENEM <=0.25 SENSITIVE Sensitive     NITROFURANTOIN <=16 SENSITIVE Sensitive     TRIMETH/SULFA <=20 SENSITIVE Sensitive     PIP/TAZO <=4 SENSITIVE Sensitive     * 20,000 COLONIES/mL CITROBACTER FREUNDII   Klebsiella oxytoca - MIC*    AMPICILLIN >=32  RESISTANT Resistant     CEFAZOLIN 8 SENSITIVE Sensitive     CEFTRIAXONE <=1 SENSITIVE Sensitive     CIPROFLOXACIN <=0.25 SENSITIVE Sensitive     GENTAMICIN <=1 SENSITIVE Sensitive     IMIPENEM <=0.25 SENSITIVE Sensitive     NITROFURANTOIN <=16 SENSITIVE Sensitive     TRIMETH/SULFA <=20 SENSITIVE Sensitive     AMPICILLIN/SULBACTAM 16 INTERMEDIATE Intermediate     PIP/TAZO <=4 SENSITIVE Sensitive     Extended ESBL NEGATIVE Sensitive     * 20,000 COLONIES/mL KLEBSIELLA OXYTOCA  MRSA PCR Screening     Status: None   Collection Time: 03/07/17  8:46 PM  Result Value Ref Range Status   MRSA by PCR NEGATIVE NEGATIVE Final    Comment:        The GeneXpert MRSA Assay (FDA approved for NASAL specimens only), is one component of  a comprehensive MRSA colonization surveillance program. It is not intended to diagnose MRSA infection nor to guide or monitor treatment for MRSA infections.      Time coordinating discharge: Over 30 minutes  SIGNED:   Louellen Molder, MD  [facility] 03/13/2017, 12:32 PM Pager   If 7PM-7AM, please contact night-coverage [email or web address] Password [email or web address]

## 2017-03-13 NOTE — Progress Notes (Signed)
CSW consulted for SNF placement. Pt is ready today for dc. PT has recommended SNF at dc. CSW met with pt / daughter Anderson Malta, at bedside, to assist with dc planning. Pt / family are in agreement with [facility]. SNF search initiated. Daughter requesting [facility] for placement. CSW has contacted SNF and bed is available today. MD notified and will dc pt today to [facility]. Pt / family are in agreement with this plan. CSW will continue to follow to assist with dc planning to SNF.  Werner Lean LCSW [phone]

## 2017-03-13 NOTE — Progress Notes (Signed)
Patient discharged to SNF via ambulance, discharge instructions given and explained to patient's daughter and report called to facility. Patient denies any pain/distress. No wound/pressure ulcer noted.

## 2017-03-13 NOTE — NC FL2 (Signed)
[place] LEVEL OF CARE SCREENING TOOL     IDENTIFICATION  Patient Name: Jody Ruiz Birthdate: Feb 02, 1941 Sex: female Admission Date (Current Location): 03/04/2017  [facility] and [place] Number:  Herbalist and Address:  [facility],  [place] [address] [place]      Provider Number: [id number]  Attending Physician Name and Address:  Louellen Molder, MD  Relative Name and Phone Number:       Current Level of Care: Hospital Recommended Level of Care: [place] Prior Approval Number:    Date Approved/Denied:   PASRR Number: [id number]  Discharge Plan: SNF    Current Diagnoses: Patient Active Problem List   Diagnosis Date Noted  . Protein-calorie malnutrition, severe 03/07/2017  . Depression due to physical illness   . Adjustment disorder with mixed anxiety and depressed mood   . Atrial fibrillation ([place]) 03/04/2017  . Syncope 02/26/2017  . PAF (paroxysmal atrial fibrillation) ([place]) 02/26/2017  . Lactic acidemia 02/26/2017  . Chronic diastolic CHF (congestive heart failure) ([place]) 02/26/2017  . Diabetes mellitus with complication ([place]) 11/94/1740  . Dehydration   . Long term (current) use of anticoagulants [Z79.01] 02/02/2017  . SOB (shortness of breath)   . Atrial fibrillation with RVR ([place])   . Glomerulonephritis   . Loose stools   . History of Clostridium difficile colitis   . AKI (acute kidney injury) ([place]) 01/14/2017  . Dyspepsia   . Acute kidney injury superimposed on chronic kidney disease ([place]) 01/13/2017  . H/O umbilical hernia repair 81/44/8185  . Belching 01/02/2017  . Gastroesophageal reflux disease 01/02/2017  . Right sided abdominal pain 01/02/2017  . Palpitations   . Tachycardia-bradycardia syndrome ([place])   . Chest pain 11/05/2016  . Renal insufficiency 10/16/2016  . B12 deficiency 10/16/2016  . Enteritis due to Clostridium difficile   . Diarrhea with dehydration 10/14/2016  .  Microscopic colitis 10/02/2016  . Hx of adenomatous colonic polyps 10/02/2016  . Altered mental status   . CAD (coronary artery disease)   . Interstitial lung disease ([place])   . HLD (hyperlipidemia)   . Precordial pain 07/17/2013  . Midsternal chest pain 07/17/2013  . Pulmonary nodule 11/22/2012  . 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 OSA (obstructive sleep apnea) 10/05/2012  . IPF (idiopathic pulmonary fibrosis) ([place]) 08/26/2012  . Coronary atherosclerosis of native coronary artery 05/04/2012  . Hypertension   . Obesity     Orientation RESPIRATION BLADDER Height & Weight     Self, Time, Situation, Place  Normal Continent Weight: 81.5 kg (179 lb 10.8 oz) Height:  5' 7.01" (170.2 cm)  BEHAVIORAL SYMPTOMS/MOOD NEUROLOGICAL BOWEL NUTRITION STATUS  Other (Comment) (no behaviors)   Continent Diet (regular)  AMBULATORY STATUS COMMUNICATION OF NEEDS Skin   Extensive Assist Verbally Normal                       Personal Care Assistance Level of Assistance  Bathing, Feeding, Dressing Bathing Assistance: Maximum assistance Feeding assistance: Independent Dressing Assistance: Maximum assistance     Functional Limitations Info  Sight, Hearing, Speech Sight Info: Adequate Hearing Info: Adequate Speech Info: Adequate    SPECIAL CARE FACTORS FREQUENCY  PT (By licensed PT), OT (By licensed OT)     PT Frequency: 5x wk OT Frequency: 5x wk            Contractures Contractures Info: Not present    Additional Factors Info  Code Status, Allergies Code Status  Info: Full Code Allergies Info: Brilinta Ticagrelor, Lisinopril, Statins, Zetia Ezetimibe, Macrodantin Nitrofurantoin Macrocrystal, Sulfa Antibiotics           Current Medications (03/13/2017):  This is the current hospital active medication list Current Facility-Administered Medications  Medication Dose Route Frequency Provider Last Rate Last Dose  . 0.9 %  sodium chloride infusion  250 mL Intravenous PRN  Reyne Dumas, MD   Stopped at 03/06/17 1900  . acetaminophen (TYLENOL) tablet 650 mg  650 mg Oral Q6H PRN Charlynne Cousins, MD       Or  . acetaminophen (TYLENOL) suppository 650 mg  650 mg Rectal Q6H PRN Charlynne Cousins, MD      . aspirin EC tablet 81 mg  81 mg Oral Daily Charlynne Cousins, MD   81 mg at 03/13/17 1024  . [START ON 03/17/2017] cyanocobalamin ((VITAMIN B-12)) injection 1,000 mcg  1,000 mcg Subcutaneous Weekly Lavina Hamman, MD      . diltiazem (CARDIZEM CD) 24 hr capsule 360 mg  360 mg Oral Daily Dhungel, Nishant, MD   360 mg at 03/13/17 1024  . famotidine (PEPCID) tablet 20 mg  20 mg Oral Daily Reyne Dumas, MD   20 mg at 03/07/17 0828  . insulin aspart (novoLOG) injection 0-5 Units  0-5 Units Subcutaneous QHS Charlynne Cousins, MD      . insulin aspart (novoLOG) injection 0-9 Units  0-9 Units Subcutaneous TID WC Charlynne Cousins, MD      . nitroGLYCERIN (NITROSTAT) SL tablet 0.4 mg  0.4 mg Sublingual Q5 Min x 3 PRN Charlynne Cousins, MD      . ondansetron [facility]) injection 4 mg  4 mg Intravenous Q6H PRN Charlynne Cousins, MD   4 mg at 03/13/17 1031  . ondansetron (ZOFRAN) tablet 4 mg  4 mg Oral Q8H PRN Charlynne Cousins, MD      . polyethylene glycol [facility] / GLYCOLAX) packet 17 g  17 g Oral Daily Lavina Hamman, MD   17 g at 03/11/17 1017  . polyvinyl alcohol (LIQUIFILM TEARS) 1.4 % ophthalmic solution 1 drop  1 drop Both Eyes TID PRN Charlynne Cousins, MD      . Derrill Memo ON 04/10/2017] predniSONE (DELTASONE) tablet 10 mg  10 mg Oral Q breakfast Lavina Hamman, MD      . Derrill Memo ON 03/27/2017] predniSONE (DELTASONE) tablet 20 mg  20 mg Oral Q breakfast Lavina Hamman, MD      . predniSONE (DELTASONE) tablet 30 mg  30 mg Oral Q breakfast Lavina Hamman, MD   30 mg at 03/13/17 0758  . [START ON 04/24/2017] predniSONE (DELTASONE) tablet 5 mg  5 mg Oral Q breakfast Lavina Hamman, MD      . Derrill Memo ON 05/08/2017] predniSONE (DELTASONE) tablet 5 mg   5 mg Oral Q48H Lavina Hamman, MD      . saccharomyces boulardii (FLORASTOR) capsule 250 mg  250 mg Oral BID Charlynne Cousins, MD   Stopped at 03/05/17 1340  . sodium chloride flush (NS) 0.9 % injection 3 mL  3 mL Intravenous Q12H Charlynne Cousins, MD   3 mL at 03/13/17 1029  . sodium chloride flush (NS) 0.9 % injection 3 mL  3 mL Intravenous PRN Charlynne Cousins, MD   3 mL at 03/09/17 1058  . traMADol (ULTRAM) tablet 50 mg  50 mg Oral Q6H PRN Charlynne Cousins, MD      . warfarin (  COUMADIN) tablet 1.25 mg  1.25 mg Oral ONCE-1800 Emiliano Dyer, [place]      . Warfarin - Pharmacist Dosing Inpatient   Does not apply q1800 Berton Mount, [facility]         Discharge Medications: Please see discharge summary for a list of discharge medications.  Relevant Imaging Results:  Relevant Lab Results:   Additional Information SS # [id number]  Tejal Monroy, Randall An, LCSW

## 2017-03-18 DIAGNOSIS — R531 Weakness: Secondary | ICD-10-CM | POA: Diagnosis not present

## 2017-03-18 DIAGNOSIS — I4891 Unspecified atrial fibrillation: Secondary | ICD-10-CM | POA: Diagnosis not present

## 2017-03-18 DIAGNOSIS — R630 Anorexia: Secondary | ICD-10-CM | POA: Diagnosis not present

## 2017-03-18 DIAGNOSIS — R110 Nausea: Secondary | ICD-10-CM | POA: Diagnosis not present

## 2017-03-18 DIAGNOSIS — E43 Unspecified severe protein-calorie malnutrition: Secondary | ICD-10-CM | POA: Diagnosis not present

## 2017-03-18 DIAGNOSIS — N183 Chronic kidney disease, stage 3 (moderate): Secondary | ICD-10-CM | POA: Diagnosis not present

## 2017-03-18 DIAGNOSIS — I10 Essential (primary) hypertension: Secondary | ICD-10-CM | POA: Diagnosis not present

## 2017-03-18 DIAGNOSIS — K219 Gastro-esophageal reflux disease without esophagitis: Secondary | ICD-10-CM | POA: Diagnosis not present

## 2017-03-20 DIAGNOSIS — R531 Weakness: Secondary | ICD-10-CM | POA: Diagnosis not present

## 2017-03-20 DIAGNOSIS — N183 Chronic kidney disease, stage 3 (moderate): Secondary | ICD-10-CM | POA: Diagnosis not present

## 2017-03-20 DIAGNOSIS — I4891 Unspecified atrial fibrillation: Secondary | ICD-10-CM | POA: Diagnosis not present

## 2017-03-20 DIAGNOSIS — R110 Nausea: Secondary | ICD-10-CM | POA: Diagnosis not present

## 2017-03-20 DIAGNOSIS — R630 Anorexia: Secondary | ICD-10-CM | POA: Diagnosis not present

## 2017-03-20 DIAGNOSIS — E43 Unspecified severe protein-calorie malnutrition: Secondary | ICD-10-CM | POA: Diagnosis not present

## 2017-03-23 ENCOUNTER — Ambulatory Visit: Payer: Medicare Other | Admitting: Pulmonary Disease

## 2017-03-24 DIAGNOSIS — E43 Unspecified severe protein-calorie malnutrition: Secondary | ICD-10-CM | POA: Diagnosis not present

## 2017-03-24 DIAGNOSIS — R110 Nausea: Secondary | ICD-10-CM | POA: Diagnosis not present

## 2017-03-24 DIAGNOSIS — N183 Chronic kidney disease, stage 3 (moderate): Secondary | ICD-10-CM | POA: Diagnosis not present

## 2017-03-24 DIAGNOSIS — R630 Anorexia: Secondary | ICD-10-CM | POA: Diagnosis not present

## 2017-03-24 DIAGNOSIS — I4891 Unspecified atrial fibrillation: Secondary | ICD-10-CM | POA: Diagnosis not present

## 2017-03-24 DIAGNOSIS — R531 Weakness: Secondary | ICD-10-CM | POA: Diagnosis not present

## 2017-03-25 ENCOUNTER — Encounter: Payer: Self-pay | Admitting: *Deleted

## 2017-03-25 DIAGNOSIS — R630 Anorexia: Secondary | ICD-10-CM | POA: Diagnosis not present

## 2017-03-25 DIAGNOSIS — E43 Unspecified severe protein-calorie malnutrition: Secondary | ICD-10-CM | POA: Diagnosis not present

## 2017-03-25 DIAGNOSIS — I4891 Unspecified atrial fibrillation: Secondary | ICD-10-CM | POA: Diagnosis not present

## 2017-03-25 DIAGNOSIS — R531 Weakness: Secondary | ICD-10-CM | POA: Diagnosis not present

## 2017-03-25 DIAGNOSIS — N183 Chronic kidney disease, stage 3 (moderate): Secondary | ICD-10-CM | POA: Diagnosis not present

## 2017-03-25 DIAGNOSIS — R110 Nausea: Secondary | ICD-10-CM | POA: Diagnosis not present

## 2017-03-26 DIAGNOSIS — R630 Anorexia: Secondary | ICD-10-CM | POA: Diagnosis not present

## 2017-03-26 DIAGNOSIS — R531 Weakness: Secondary | ICD-10-CM | POA: Diagnosis not present

## 2017-03-26 DIAGNOSIS — N183 Chronic kidney disease, stage 3 (moderate): Secondary | ICD-10-CM | POA: Diagnosis not present

## 2017-03-26 DIAGNOSIS — R110 Nausea: Secondary | ICD-10-CM | POA: Diagnosis not present

## 2017-03-26 DIAGNOSIS — I4891 Unspecified atrial fibrillation: Secondary | ICD-10-CM | POA: Diagnosis not present

## 2017-03-26 DIAGNOSIS — E43 Unspecified severe protein-calorie malnutrition: Secondary | ICD-10-CM | POA: Diagnosis not present

## 2017-03-27 DIAGNOSIS — R531 Weakness: Secondary | ICD-10-CM | POA: Diagnosis not present

## 2017-03-27 DIAGNOSIS — I4891 Unspecified atrial fibrillation: Secondary | ICD-10-CM | POA: Diagnosis not present

## 2017-03-27 DIAGNOSIS — E43 Unspecified severe protein-calorie malnutrition: Secondary | ICD-10-CM | POA: Diagnosis not present

## 2017-03-27 DIAGNOSIS — R630 Anorexia: Secondary | ICD-10-CM | POA: Diagnosis not present

## 2017-03-27 DIAGNOSIS — R110 Nausea: Secondary | ICD-10-CM | POA: Diagnosis not present

## 2017-03-27 DIAGNOSIS — N183 Chronic kidney disease, stage 3 (moderate): Secondary | ICD-10-CM | POA: Diagnosis not present

## 2017-03-28 DIAGNOSIS — E43 Unspecified severe protein-calorie malnutrition: Secondary | ICD-10-CM | POA: Diagnosis not present

## 2017-03-28 DIAGNOSIS — I4891 Unspecified atrial fibrillation: Secondary | ICD-10-CM | POA: Diagnosis not present

## 2017-03-28 DIAGNOSIS — R110 Nausea: Secondary | ICD-10-CM | POA: Diagnosis not present

## 2017-03-28 DIAGNOSIS — N183 Chronic kidney disease, stage 3 (moderate): Secondary | ICD-10-CM | POA: Diagnosis not present

## 2017-03-28 DIAGNOSIS — R531 Weakness: Secondary | ICD-10-CM | POA: Diagnosis not present

## 2017-03-28 DIAGNOSIS — R630 Anorexia: Secondary | ICD-10-CM | POA: Diagnosis not present

## 2017-03-29 DIAGNOSIS — R630 Anorexia: Secondary | ICD-10-CM | POA: Diagnosis not present

## 2017-03-29 DIAGNOSIS — R531 Weakness: Secondary | ICD-10-CM | POA: Diagnosis not present

## 2017-03-29 DIAGNOSIS — I4891 Unspecified atrial fibrillation: Secondary | ICD-10-CM | POA: Diagnosis not present

## 2017-03-29 DIAGNOSIS — R110 Nausea: Secondary | ICD-10-CM | POA: Diagnosis not present

## 2017-03-29 DIAGNOSIS — E43 Unspecified severe protein-calorie malnutrition: Secondary | ICD-10-CM | POA: Diagnosis not present

## 2017-03-29 DIAGNOSIS — N183 Chronic kidney disease, stage 3 (moderate): Secondary | ICD-10-CM | POA: Diagnosis not present

## 2017-03-30 DIAGNOSIS — N183 Chronic kidney disease, stage 3 (moderate): Secondary | ICD-10-CM | POA: Diagnosis not present

## 2017-03-30 DIAGNOSIS — I4891 Unspecified atrial fibrillation: Secondary | ICD-10-CM | POA: Diagnosis not present

## 2017-03-30 DIAGNOSIS — R531 Weakness: Secondary | ICD-10-CM | POA: Diagnosis not present

## 2017-03-30 DIAGNOSIS — R110 Nausea: Secondary | ICD-10-CM | POA: Diagnosis not present

## 2017-03-30 DIAGNOSIS — R630 Anorexia: Secondary | ICD-10-CM | POA: Diagnosis not present

## 2017-03-30 DIAGNOSIS — E43 Unspecified severe protein-calorie malnutrition: Secondary | ICD-10-CM | POA: Diagnosis not present

## 2017-04-06 DEATH — deceased

## 2017-04-15 ENCOUNTER — Ambulatory Visit: Payer: Medicare Other | Admitting: Internal Medicine

## 2017-04-17 ENCOUNTER — Ambulatory Visit: Payer: Medicare Other | Admitting: Cardiology

## 2017-04-20 ENCOUNTER — Ambulatory Visit: Payer: Self-pay | Admitting: Physician Assistant

## 2017-11-21 IMAGING — CR DG CHEST 2V
2 series · 2 of 2 positions shown · non-contrast
Comparison: January 16, 2017

CLINICAL DATA: Chronic renal failure. The patient has been
lethargic.

EXAM:
CHEST  2 VIEW

[chest lat]
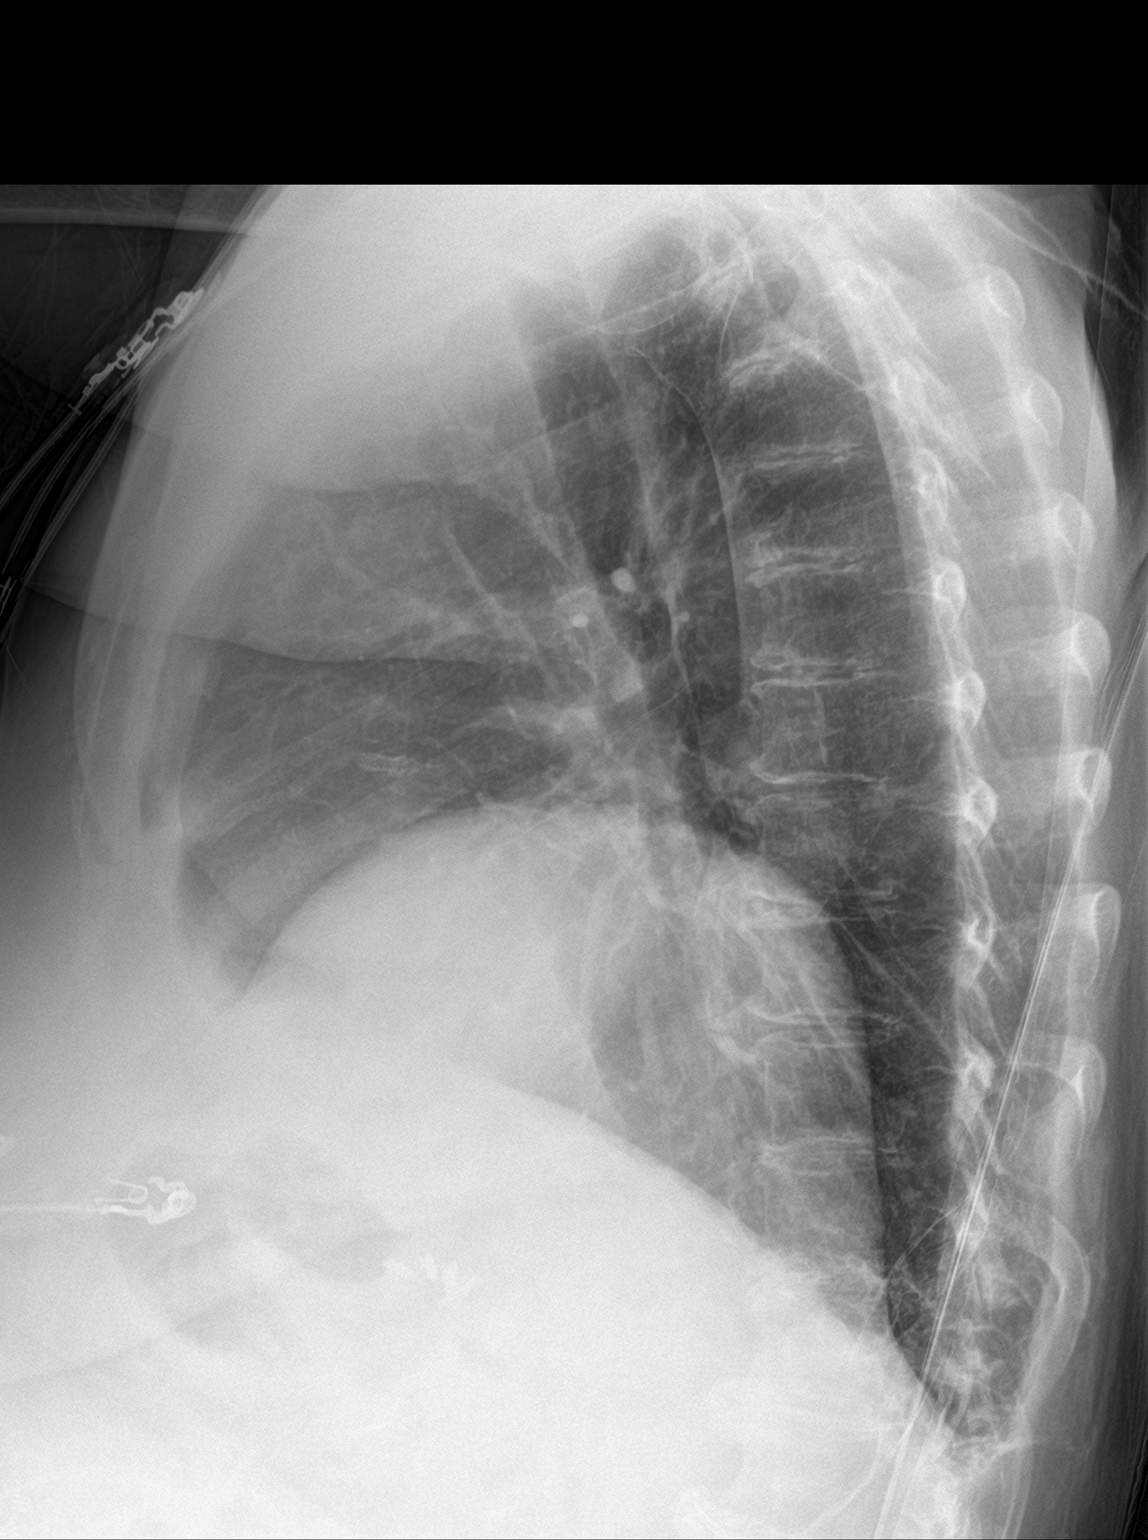

[chest ap]
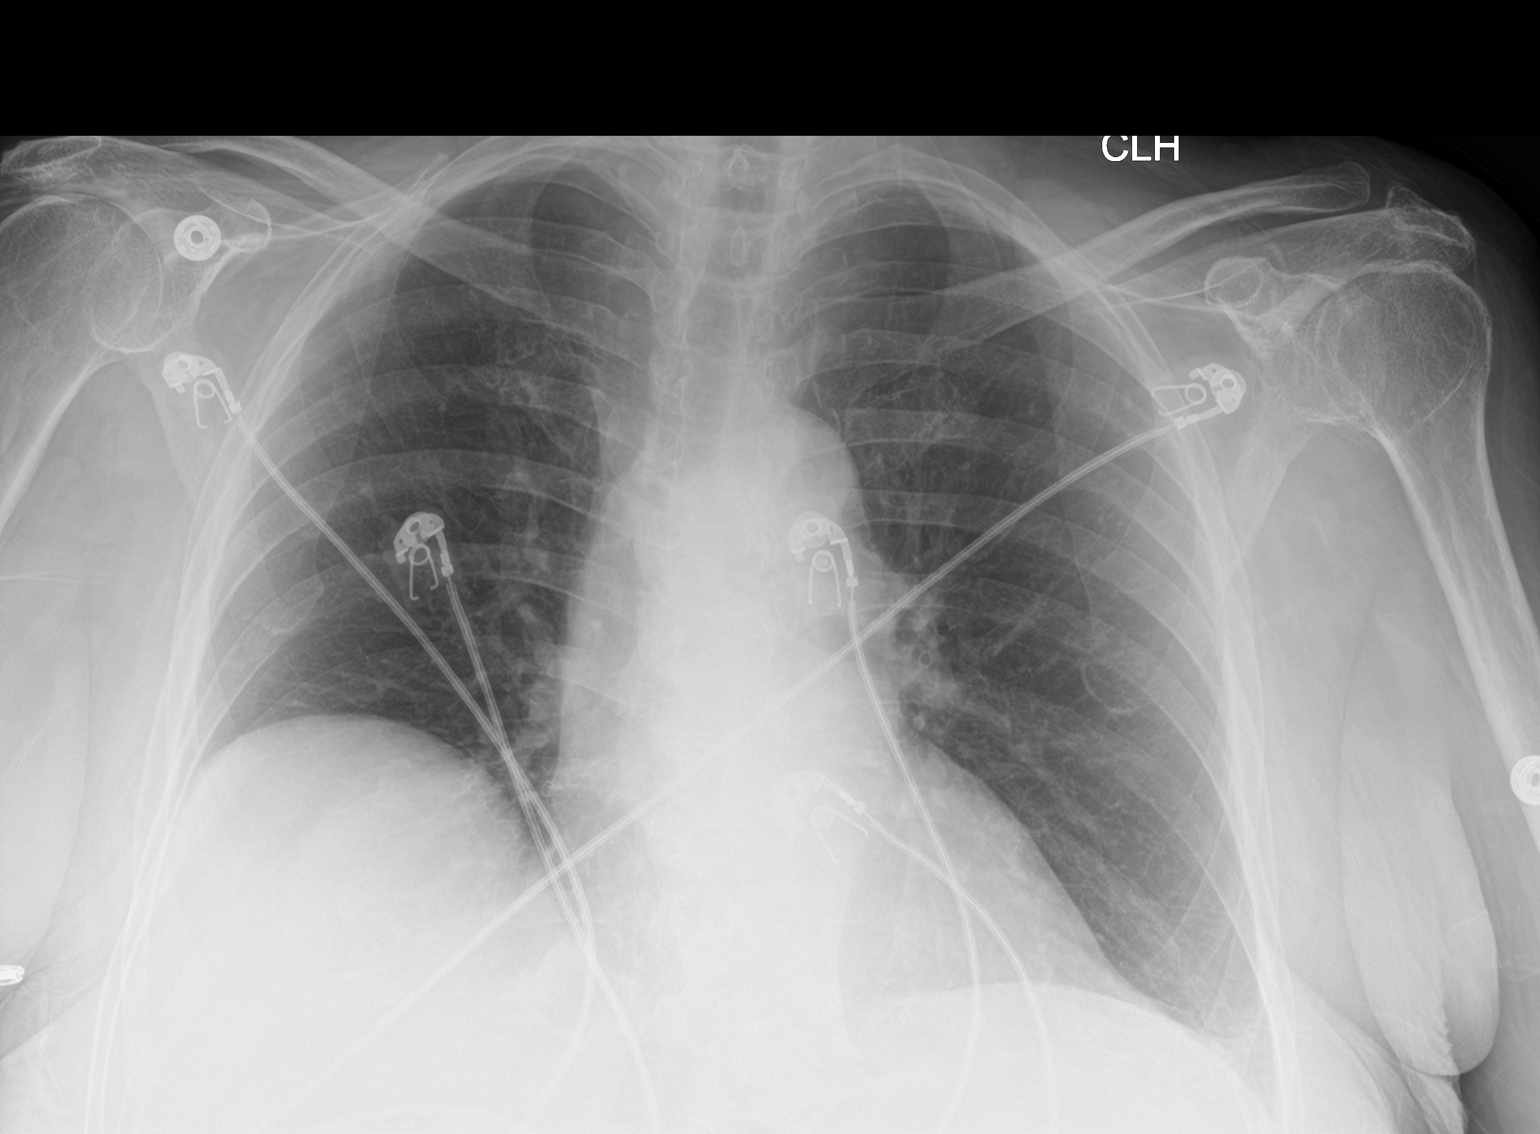

[2 of 2 positions shown; findings below may reference images not displayed]

FINDINGS: Elevation of the right hemidiaphragm persists. The heart, hila,
mediastinum, lungs, and pleura are otherwise normal. No acute
interval changes.
IMPRESSION: No active cardiopulmonary disease.
